# Patient Record
Sex: Female | Born: 1940 | Race: White | Hispanic: No | Marital: Married | Smoking: Never smoker
Health system: Southern US, Community
[De-identification: ages and names within clinical notes are randomized; demographics above are authoritative.]

## PROBLEM LIST (undated history)

## (undated) DIAGNOSIS — E785 Hyperlipidemia, unspecified: Secondary | ICD-10-CM

## (undated) DIAGNOSIS — I10 Essential (primary) hypertension: Secondary | ICD-10-CM

## (undated) DIAGNOSIS — N183 Chronic kidney disease, stage 3 unspecified: Secondary | ICD-10-CM

## (undated) DIAGNOSIS — F329 Major depressive disorder, single episode, unspecified: Secondary | ICD-10-CM

## (undated) DIAGNOSIS — I639 Cerebral infarction, unspecified: Secondary | ICD-10-CM

## (undated) DIAGNOSIS — K219 Gastro-esophageal reflux disease without esophagitis: Secondary | ICD-10-CM

## (undated) DIAGNOSIS — D649 Anemia, unspecified: Secondary | ICD-10-CM

## (undated) HISTORY — PX: OTHER SURGICAL HISTORY: SHX169

## (undated) HISTORY — PX: URETER SURGERY: SHX823

## (undated) HISTORY — DX: Cerebral infarction, unspecified: I63.9

## (undated) HISTORY — PX: OOPHORECTOMY: SHX86

## (undated) HISTORY — DX: Anemia, unspecified: D64.9

## (undated) HISTORY — DX: Gastro-esophageal reflux disease without esophagitis: K21.9

## (undated) HISTORY — DX: Major depressive disorder, single episode, unspecified: F32.9

## (undated) HISTORY — DX: Hyperlipidemia, unspecified: E78.5

## (undated) HISTORY — PX: BACK SURGERY: SHX140

## (undated) HISTORY — PX: ABDOMINAL HYSTERECTOMY: SHX81

## (undated) HISTORY — DX: Essential (primary) hypertension: I10

## (undated) HISTORY — PX: TOTAL HIP ARTHROPLASTY: SHX124

---

## 2002-11-02 ENCOUNTER — Ambulatory Visit (HOSPITAL_BASED_OUTPATIENT_CLINIC_OR_DEPARTMENT_OTHER): Admission: RE | Admit: 2002-11-02 | Discharge: 2002-11-02 | Payer: Self-pay | Admitting: Urology

## 2002-11-30 ENCOUNTER — Encounter: Payer: Self-pay | Admitting: Neurosurgery

## 2002-12-02 ENCOUNTER — Observation Stay (HOSPITAL_COMMUNITY): Admission: RE | Admit: 2002-12-02 | Discharge: 2002-12-03 | Payer: Self-pay | Admitting: Neurosurgery

## 2002-12-02 ENCOUNTER — Encounter: Payer: Self-pay | Admitting: Neurosurgery

## 2003-03-22 ENCOUNTER — Ambulatory Visit (HOSPITAL_COMMUNITY): Admission: RE | Admit: 2003-03-22 | Discharge: 2003-03-22 | Payer: Self-pay | Admitting: Urology

## 2003-05-11 ENCOUNTER — Other Ambulatory Visit: Admission: RE | Admit: 2003-05-11 | Discharge: 2003-05-11 | Payer: Self-pay | Admitting: Gynecology

## 2003-05-11 ENCOUNTER — Encounter (INDEPENDENT_AMBULATORY_CARE_PROVIDER_SITE_OTHER): Payer: Self-pay | Admitting: *Deleted

## 2003-05-11 ENCOUNTER — Ambulatory Visit: Admission: RE | Admit: 2003-05-11 | Discharge: 2003-05-11 | Payer: Self-pay | Admitting: Gynecology

## 2003-05-25 ENCOUNTER — Encounter (INDEPENDENT_AMBULATORY_CARE_PROVIDER_SITE_OTHER): Payer: Self-pay | Admitting: Specialist

## 2003-05-25 ENCOUNTER — Inpatient Hospital Stay (HOSPITAL_COMMUNITY): Admission: RE | Admit: 2003-05-25 | Discharge: 2003-05-29 | Payer: Self-pay | Admitting: Urology

## 2003-06-24 ENCOUNTER — Ambulatory Visit (HOSPITAL_BASED_OUTPATIENT_CLINIC_OR_DEPARTMENT_OTHER): Admission: RE | Admit: 2003-06-24 | Discharge: 2003-06-24 | Payer: Self-pay | Admitting: Urology

## 2003-06-24 ENCOUNTER — Ambulatory Visit (HOSPITAL_COMMUNITY): Admission: RE | Admit: 2003-06-24 | Discharge: 2003-06-24 | Payer: Self-pay | Admitting: Urology

## 2003-07-06 ENCOUNTER — Ambulatory Visit: Admission: RE | Admit: 2003-07-06 | Discharge: 2003-07-06 | Payer: Self-pay | Admitting: Gynecology

## 2004-11-16 ENCOUNTER — Ambulatory Visit: Payer: Self-pay

## 2005-08-16 ENCOUNTER — Inpatient Hospital Stay (HOSPITAL_COMMUNITY): Admission: EM | Admit: 2005-08-16 | Discharge: 2005-08-23 | Payer: Self-pay | Admitting: Emergency Medicine

## 2005-08-19 ENCOUNTER — Ambulatory Visit: Payer: Self-pay | Admitting: Physical Medicine & Rehabilitation

## 2006-09-09 ENCOUNTER — Encounter (INDEPENDENT_AMBULATORY_CARE_PROVIDER_SITE_OTHER): Payer: Self-pay | Admitting: Family Medicine

## 2006-09-09 ENCOUNTER — Ambulatory Visit: Payer: Self-pay | Admitting: Vascular Surgery

## 2006-09-09 ENCOUNTER — Ambulatory Visit (HOSPITAL_COMMUNITY): Admission: RE | Admit: 2006-09-09 | Discharge: 2006-09-09 | Payer: Self-pay | Admitting: Family Medicine

## 2007-10-23 ENCOUNTER — Ambulatory Visit (HOSPITAL_COMMUNITY): Admission: RE | Admit: 2007-10-23 | Discharge: 2007-10-23 | Payer: Self-pay | Admitting: Urology

## 2009-08-29 ENCOUNTER — Ambulatory Visit (HOSPITAL_COMMUNITY): Admission: RE | Admit: 2009-08-29 | Discharge: 2009-08-29 | Payer: Self-pay | Admitting: Legal Medicine

## 2009-08-29 ENCOUNTER — Ambulatory Visit: Payer: Self-pay | Admitting: Vascular Surgery

## 2009-08-31 ENCOUNTER — Ambulatory Visit (HOSPITAL_COMMUNITY)
Admission: RE | Admit: 2009-08-31 | Discharge: 2009-08-31 | Payer: Self-pay | Source: Home / Self Care | Admitting: Legal Medicine

## 2009-10-11 ENCOUNTER — Ambulatory Visit: Payer: Self-pay | Admitting: Vascular Surgery

## 2010-03-21 ENCOUNTER — Ambulatory Visit (HOSPITAL_COMMUNITY)
Admission: RE | Admit: 2010-03-21 | Discharge: 2010-03-21 | Payer: Self-pay | Source: Home / Self Care | Attending: Urology | Admitting: Urology

## 2010-04-12 ENCOUNTER — Ambulatory Visit
Admission: RE | Admit: 2010-04-12 | Discharge: 2010-04-12 | Payer: Self-pay | Source: Home / Self Care | Attending: Vascular Surgery | Admitting: Vascular Surgery

## 2010-04-15 ENCOUNTER — Encounter: Payer: Self-pay | Admitting: Urology

## 2010-04-27 NOTE — Procedures (Unsigned)
CAROTID DUPLEX EXAM  INDICATION:  Carotid disease.  HISTORY: Diabetes:  No. Cardiac:  No. Hypertension:  Yes. Smoking:  No. Previous Surgery:  No. CV History:  Currently asymptomatic. Amaurosis Fugax No, Paresthesias No, Hemiparesis No                                      RIGHT             LEFT Brachial systolic pressure:         142               146 Brachial Doppler waveforms:         Normal            Normal Vertebral direction of flow:        Antegrade         Antegrade DUPLEX VELOCITIES (cm/sec) CCA peak systolic                   91                74 ECA peak systolic                   315               353 ICA peak systolic                   P=76/M=136        172 ICA end diastolic                   P=21/M=39         41 PLAQUE MORPHOLOGY:                  Heterogeneous     Heterogeneous PLAQUE AMOUNT:                      Moderate          Moderate PLAQUE LOCATION:                    ECA               PICA/ ECA  IMPRESSION: 1. No hemodynamically significant stenosis of the right internal     carotid artery with mildly increased velocity noted in the right     mid internal carotid artery which appears to be due to vessel     tortuosity. 2. Doppler velocity suggests 40% to 59% stenosis of the left proximal     internal carotid artery. 3. Bilateral external carotid artery stenosis noted.  ___________________________________________ Janetta Hora Fields, MD  CH/MEDQ  D:  04/13/2010  T:  04/13/2010  Job:  [id number]

## 2010-08-07 NOTE — Assessment & Plan Note (Signed)
OFFICE VISIT   SAYDIE, GERDTS  DOB:  January 20, 1941                                       10/11/2009  FAOZH#:[id number]   CHIEF COMPLAINT:  Visual changes.   HISTORY OF PRESENT ILLNESS:  The patient is a 70 year old female  referred by Dr. Marina Goodell for evaluation of carotid stenosis.  The patient  states that approximately 2 months ago she had a couple of episodes  where she had swirling of colors in both eyes and became presyncopal.  She has had no symptoms like this since that time.  She denies any  classic symptoms of amaurosis.  She denies any prior stroke symptoms.  She was started on aspirin 1 month ago.  She also was found recently to  have elevated cholesterol.   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  These are currently controlled and followed  chronically by Dr. Gwen Pounds and Dr. Marina Goodell.   As part of her workup for these visual changes she had an MRI of the  brain.  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  These are  scattered bilaterally.   She also had an echocardiogram which showed an ejection fraction of 75%  with aortic valve sclerosis and mitral valve regurgitation.   PAST SURGICAL HISTORY:  She has had back operation, kidney operation,  hysterectomy, multiple cataracts, a hip fracture, wrist fracture and  multiple eye procedures for her glaucoma.   SOCIAL HISTORY:  She is married.  She does not smoke or consume alcohol  regularly.  She has one child.   FAMILY HISTORY:  Remarkable for her father who died at age 34 of  coronary artery disease and her brother had coronary artery disease at  age 78.   REVIEW OF SYSTEMS:  Full 12 point review of systems was performed with  the patient today.  Please see intake referral form for details  regarding this.   MEDICATIONS:  Losartan, furosemide, iron sulfate, aspirin,  vitamin C.   ALLERGIES:  She has nausea and vomiting with codeine.   PHYSICAL EXAM:  Vital signs:  Blood pressure is 161/84 in the right arm,  177/92 in the left arm, oxygen saturations 98% on room air, heart rate  67 and regular.  HEENT:  Unremarkable.  Neck:  Has 2+ carotid pulses  with bilateral carotid bruits right greater than left.  Chest:  Clear to  auscultation.  Cardiac:  Exam is regular rate and rhythm with a 3/6  murmur.  Abdomen:  Slightly obese, soft, nontender, nondistended.  No  masses.  Extremities:  She has no significant edema.  She has 2+ femoral  and dorsalis pedis pulses bilaterally.  She has 2+ brachial and radial  pulses bilaterally.  Neurological:  Shows symmetric upper extremity and  lower extremity motor strength which is 5/5 and symmetric.  Skin:  Has  no open ulcers or rashes.   I reviewed her carotid duplex exam dated 08/29/2009 from [facility].  This shows a 60% right internal carotid artery stenosis and  approximately a 70% to 75% left internal carotid artery stenosis with  antegrade vertebral flow.   I would not consider the patient's carotid stenosis to be symptomatic at  this point.  She has had evidence of  infarcts in the past but these were  more deep brain in nature and probably more related to hypertension than  carotid occlusive disease.  She has not had classic symptoms of  amaurosis or TIA symptoms recently.  I believe the best option for now  is risk factor modification including her aspirin therapy and evaluation  or continued evaluation of her hypertension and elevated cholesterol.  She will return for followup with me for a repeat carotid duplex exam in  six months' time.  She was noted to have high-grade stenosis of the  external carotid artery on the right side and this probably accounts for  the louder bruit.  However, the external carotid stenosis would not put  her at risk for cerebral events and overall again would consider  this to  be asymptomatic moderate stenoses which does not require intervention at  this point.  We will schedule her for a followup appointment in six  months' time.  I had a lengthy discussion with her and her husband that  if she develops any cerebral symptoms prior to that they will return  sooner.     Janetta Hora. Fields, MD  Electronically Signed   CEF/MEDQ  D:  10/11/2009  T:  10/12/2009  Job:  [id number]   cc:   Dr Thurman Coyer, MD

## 2010-08-10 NOTE — H&P (Signed)
[name], [name]                            ACCOUNT NO.:  [id number]   MEDICAL RECORD NO.:  [id number]                   PATIENT TYPE:  INP   LOCATION:  0001                                 FACILITY:  [facility]   PHYSICIAN:  Ronald L. Earlene Plater, M.D.               DATE OF BIRTH:  25-Aug-1940   DATE OF ADMISSION:  05/25/2003  DATE OF DISCHARGE:                                HISTORY & PHYSICAL   CHIEF COMPLAINT:  1. Left-sided hydroureteronephrosis.  2. Left-sided cystic pelvic mass.   HISTORY OF PRESENT ILLNESS:  [name] is a 70 year old white female with a  history of a left cystic adnexal mass as well as a very large dilated left-  sided ureter with accompanying hydronephrosis.  A nuclear renogram has been  performed demonstrating obstruction on the left side.  However, there is  preserved renal function as the differential renal function is 60% on the  right and 40% on the left.  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  In addition, [name] has been  found on CT scan to have a cystic left adnexal mass.  She has seen Dr.  Stanford Breed who is willing to remove the cystic mass and perform a  hysterectomy at the time of potential ureteral reimplantation.  Otherwise,  she is largely asymptomatic from her cyst as well as her hydroureter and  hydronephrosis.  She denies any fevers, chills, night sweats, nausea or  vomiting.   PAST MEDICAL HISTORY:  1. Hypertension.  2. Hiatal hernia/gastroesophageal reflux disease.  3. Arthritis.  4. Hypercholesterolemia.   PAST SURGICAL HISTORY:  1. Lumbar laminectomy.  2. Tubal ligation.  3. Cystoscopy with ureteroscopy.   FAMILY HISTORY:  There is no family history of genitourinary malignancy.   SOCIAL HISTORY:  The patient does not smoke.  She denies alcohol use or any  other illicit drugs.   MEDICATIONS:  1. Prilosec 20 mg daily.  2. Cozaar 100 mg daily.  3. Norvasc 5 mg daily.  4.  Zocor 40 mg daily.  5. Maxzide 25 mg daily.   REVIEW OF SYSTEMS:  Positive for hypertension as well as history of heart  murmur and shortness of breath.  Also, pertinent is history of corrected  vision by contact lenses as well as partial dentures.  The patient denies  any angina, chronic obstructive pulmonary disease, headache, seizures,  dizziness, anemia, easy bruising, constipation, diarrhea, or lower urinary  tract symptoms.   PHYSICAL EXAMINATION:  VITAL SIGNS:  Temperature 97.8. heart rate 72,  respiratory rate 16, blood pressure 156/100, weight 158 pounds.  GENERAL:  Well developed, mildly obese in no apparent distress.  HEENT:  Normocephalic, atraumatic.  Pupils equal, round and reactive to  light and accommodation.  Extraocular movements are intact.  The oropharynx  is clear and moist.  NECK:  Supple.  No lymphadenopathy.  There is no jugular venous distention.  CARDIOVASCULAR:  Regular  rate and rhythm.  No murmurs, gallops or rubs.  PULMONARY:  Clear to auscultation bilaterally.  ABDOMEN:  Obese, soft, nontender, nondistended with positive bowel sounds.  GU:  Normal-appearing female genitalia.  No evidence of discharge.  EXTREMITIES:  No clubbing, cyanosis or edema.  NEURO:  Cranial nerves II-XII grossly intact.  Motor is 5/5 throughout.  Sensation is grossly nonfocal.  SKIN:  Integument is intact.  No evidence of rash.   ALLERGIES:  CODEINE.   OUTSIDE IMAGING:  CT scan from March 24, 2003 shows a cystic left adnexal  mass measuring approximately 10.5 cm.  In addition, there is persistent  opacification of the left renal collecting system and hydroureter to the  level of the ureterovesical junction.  There was no evidence of solid mass  or adenopathy.  Nuclear renogram dated March 22, 2003:  Renogram showed a  differential renal function of 39.8% on the left and 60.2% on the right.  There was marked left hydronephrosis without significant emptying of the  left  renal collecting system following the administration of Lasix.  This is  consistent with high grade obstruction.   IMPRESSION:  [name] is a 70 year old white female who has currently two  surgical issues.  The first is a large left cystic adnexal mass.  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  She does appear to be obstructed on this  side, but appears to have salvageable function on this side as demonstrated  by her renogram.  Therefore, the plan will be to proceed with surgery on  May 25, 2003.  At this time, the patient will undergo hysterectomy with  removal of her cystic left adnexal mass as well as left ureteral  intravesical reimplantation.  The risks and benefits of the procedure have  been discussed with the patient and she is willing to proceed.  The  gynecological portion of the case will be conducted by Dr. Stanford Breed.     [name]                                Ronald L. Earlene Plater, M.D.    EG/MEDQ  D:  05/25/2003  T:  05/25/2003  Job:  [id number]

## 2010-08-10 NOTE — Op Note (Signed)
[name], CRITES NO.:  [id number]   MEDICAL RECORD NO.:  [id number]                   PATIENT TYPE:  INP   LOCATION:  0001                                 FACILITY:  [facility]   PHYSICIAN:  De Blanch, M.D.         DATE OF BIRTH:  March 03, 1941   DATE OF PROCEDURE:  DATE OF DISCHARGE:                                 OPERATIVE REPORT   PREOPERATIVE DIAGNOSIS:  Large left ovarian cyst, left ureterolysis.   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  Hydroureter.  (See Dr. Darvin Neighbours' dictation).   PROCEDURE:  Total abdominal hysterectomy and bilateral salpingo-  oophorectomy.   SURGEON:  De Blanch, M.D.   ASSISTANTS:  1. Ronald L. Earlene Plater, M.D.  2. Telford Nab, RN.   ANESTHESIA:  General with orotracheal tube.   ESTIMATED BLOOD LOSS:  100 cc.   SURGICAL FINDINGS:  At the time of exploratory laparotomy, there was a  large, smooth cystic mass arising from the left ovary, measuring  approximately 10 cm in diameter.  The right ovary, uterus, and fallopian  tubes appeared normal.  Exploration of the upper abdomen revealed no  abnormalities.  The left ureter was massively dilated (see Dr. Darvin Neighbours'  dictation).   PROCEDURE:  The patient was brought to the operating room and after  satisfaction obtainment of general anesthesia, the patient was placed in a  modified lithotomy position in Allen's stirrups.  The anterior abdominal  wall, perineum, and vagina were prepped with Betadine.  The Foley catheter  was inserted.  The patient was draped.  The abdomen was entered through a  low midline incision, and peritoneal washings were obtained.  The upper  abdomen and pelvis were explored with the above-noted findings and a  Bookwalter retractor was positioned, and the bowel was packed out of the  pelvis.  The left lateral peritoneum of the pelvis was incised, and the  retroperitoneal space opened,  identifying the ovarian vessels and the  ureter.  The ureter was massively dilated.  The ovarian vessels were  skeletonized, clamped, cut, free-tied and suture ligated.  The ovarian cyst  was then elevated, and the uterine-ovarian anastomosis and fallopian tube  were cross-clamped, divided, and the left tube and ovary were submitted for  a frozen section, which returned benign.  The right round ligament was  divided, and the retroperitoneal space opened.  The right ovarian vessels  were skeletonized, clamped, cut, free-tied and suture ligated after  identifying the right ureter.  The bladder flap was advanced to sharp and  blunt dissection.  Uterine vessels were skeletonized, clamped, cut, and  suture ligated in a step-wise fashion.  Cervical and cardinal ligaments were  clamped, cut, and suture-ligated.  The rectovaginal septum was opened for  further mobilization.  The vaginal angles were cross-clamped and divided,  transecting the cervix from its connection to the vagina.  Vaginal angles  were transfixed  with 0 Vicryl, sutured in the central portion of the vagina,  and rectovaginal space was closed with interrupted figure-of-eight sutures  of 0 Vicryl.  Hemostasis was excellent.   At this junction of the procedure, Dr. Earlene Plater assumed direction of the  surgical procedure to manage the patient's left hydroureter.  The remainder  of the procedure will be dictated by Dr. Earlene Plater.   At the completion of the surgical procedure, the sponge, needle, and  instrument counts were correct x2.                                               De Blanch, M.D.    DC/MEDQ  D:  05/25/2003  T:  05/25/2003  Job:  [id number]   cc:   Windy Fast L. Ovidio Hanger, M.D.  [address]  [place] [address]  [place] [address]  Fax: [phone]   Telford Nab, R.N.  [phone]  [place], [place] [address]

## 2010-08-10 NOTE — H&P (Signed)
NAMESABEEN, PIECHOCKI NO.:  [id number]   MEDICAL RECORD NO.:  [id number]          PATIENT TYPE:  INP   LOCATION:  1511                         FACILITY:  [facility]   PHYSICIAN:  Nadara Mustard, MD     DATE OF BIRTH:  [birth year]   DATE OF ADMISSION:  08/15/2005  DATE OF DISCHARGE:  08/23/2005                                HISTORY & PHYSICAL   HISTORY OF PRESENT ILLNESS:  The patient is a 70 year old woman who fell on  the day of evaluation, sustaining a distal radius fracture with dorsal  angulation and shortening, as well as a left femoral neck fracture.  The  patient was evaluated in the emergency room, routine laboratory studies were  obtained, and the patient was felt to be stable for surgical intervention  for fixation of the femoral neck fracture as well as left distal radius  fracture.   For list of medications, allergies, previous surgeries, family history,  social history, and review of systems, please see history and physical  summary sheet.   PHYSICAL EXAMINATION:  VITAL SIGNS:  Blood pressure 116/58, pulse 81,  respiratory rate 18, O2 sat is 98% on room air.  GENERAL:  She is in no acute distress.  LUNGS:  Clear to auscultation.  CARDIOVASCULAR:  Regular rate and rhythm.  NECK:  Supple.  No bruits.  EXTREMITIES:  On examination of the left lower extremity, she has pain with  internal and external rotation of the left foot.  She does have a palpable  dorsalis pedis pulse.  Radiograph shows a left femoral neck fracture.  On  examination of the left wrist, she has decreased range of motion of the  fingers secondary to the left distal radius fracture.  Radiograph shows a  left distal radius fracture.   ASSESSMENT:  1.  Left femoral neck fracture.  2.  Left distal radius fracture.   PLAN:  The patient is scheduled for hemi-arthroplasty of the left hip at  this time.  Also evaluated for closed versus open reduction of the left  distal radius.  Risks and  benefits were discussed including infection,  neurovascular injury, persistent pain, DVT, pulmonary embolus, need for  additional surgery.  The patient states she understands and wishes to  proceed at this time.      Nadara Mustard, MD  Electronically Signed     MVD/MEDQ  D:  09/25/2005  T:  09/25/2005  Job:  [id number]

## 2010-08-10 NOTE — Discharge Summary (Signed)
[name], KEMLER NO.:  [id number]   MEDICAL RECORD NO.:  [id number]                   PATIENT TYPE:  INP   LOCATION:  0362                                 FACILITY:  [facility]   PHYSICIAN:  Lucrezia Starch. Ovidio Hanger, M.D.           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   OPERATIVE PROCEDURE:  Abdominal exploratory laparotomy, excision of left  pelvic mass, hysterectomy, and bilateral salpingo-oophorectomy by Dr. De Blanch; ureteral dissection with ureteral re-implantation by Dr.  Gaynelle Arabian on May 25, 2003.   HISTORY AND PHYSICAL EXAMINATION:  Ms. Vanzile is a lovely 70 year old white  female who presented with history of left cystic mass as well as a large  dilated left-sided ureter with accompanying hydronephrosis. A nuclear renal  scan was performed demonstrating obstruction on the left side, preserved  renal function with function of 60% on the right and 40% on the left. 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 On CT scan, she had a left  cystic adnexal mass.  After discussing the benefits, risks, and  alternatives, she elected to proceed with repair.   Past medical history, social history, family history, and review of systems,  please see history and physical examination for full details.   PHYSICAL EXAMINATION:  VITAL SIGNS: She was afebrile, with vital signs  stable.  GENERAL: Well-developed, well-nourished, slightly obese, in no acute  distress.  HEAD, EYES, EARS, NOSE & THROAT:  Normal.  NECK:  Without mass or thyromegaly.  CHEST: Normal diaphragmatic motion.  ABDOMEN: Soft and nontender without mass, organomegaly, or hernia.  EXTREMITIES: Normal.  NEUROLOGIC: Intact.   HOSPITAL COURSE:  The patient was admitted after undergoing proper  preoperative evaluation and subsequently taken to surgery on May 25, 2003,  and underwent excision of left cystic adnexal mass, hysterectomy, bilateral  salpingo-oophorectomy, dissection of left ureter, and left ureteral re-  implantation by Dr. Stanford Breed and myself. Postoperatively, initially  she was doing very well. Jackson-Pratt times two had serosanguineous fluid  only, I&O was adequate. By May 26, 2003, she had a low grade fever, abdomen  was soft, postoperative H&H was stable, and she was comfortable by  postoperative day and tolerating a diet. By postoperative day two, T-max was  100.4 degrees Fahrenheit. She had a few rales in her lung bases probably  felt to be due to atelectasis. Abdomen was soft. The wound was clean.  Extremities normal. The Blake output was decreased. Urine was clear and I&O  was adequate. Bowel sounds were positive and there was no flatus. She was  tolerating clear liquids and was increased to full liquids with pulmonary  toilet. Ambulation was increased by May 28, 2003. She was tolerating a diet  well, abdomen was soft, wound was clean, drains had 5 cc each, Foley was  clear, and PCA morphine was discontinued. Diet was advanced. By May 29, 2003, she was eating well, had a bowel movement, abdomen was soft and  nontender, and nondistended. She was subsequently discharged and her left  drain was removed. The right drain was maintained.   DISCHARGE MEDICATIONS:  Darvocet N-100.   DISCHARGE CONDITION:  Improved.   She is to follow up next week for staple removal.                                               Ronald L. Ovidio Hanger, M.D.    RLD/MEDQ  D:  05/31/2003  T:  06/01/2003  Job:  [id number]

## 2010-08-10 NOTE — Op Note (Signed)
NAMEEDNA, REDE                            ACCOUNT NO.:  [id number]   MEDICAL RECORD NO.:  [id number]                   PATIENT TYPE:  AMB   LOCATION:  NESC                                 FACILITY:  [facility]   PHYSICIAN:  Ronald L. Ovidio Hanger, M.D.           DATE OF BIRTH:  14-Nov-1940   DATE OF PROCEDURE:  11/02/2002  DATE OF DISCHARGE:                                 OPERATIVE REPORT   PREOPERATIVE DIAGNOSIS:  Left hydroureteral nephrosis.   OPERATION/PROCEDURE:  1. Cystourethroscopy.  2. Left retrograde pyelogram.  3. Balloon dilatation of left distal ureter with ureteroscopy.   SURGEON:  Lucrezia Starch. Earlene Plater, M.D.   ANESTHESIA:  Laryngeal mask anesthesia   ESTIMATED BLOOD LOSS:  Negligible.   TUBES:  None.   COMPLICATIONS:  None.   INDICATIONS FOR PROCEDURE:  Mrs. Summey is a lovely 70 year old white female  who basically presented with left leg pain and back pain.  She had undergone  an MRI which revealed a massively left hydroureteral nephrosis and a  followup CT scan which revealed a massive left hydroureteral nephrosis  columned to the ureterovesical junction.  There was no stone, etc., seen.  After understanding risks, benefits and alternatives, she has elected to  proceed with the above procedure.   DESCRIPTION OF PROCEDURE:  The patient was placed in the supine position.  After a  proper laryngeal mask anesthesia, was placed in the dorsal  lithotomy position and prepped and draped with Betadine in a sterile  fashion.  Cystourethroscopy was performed with a 22.5-French Olympus  panendoscope utilizing the 12- and in 70-degree lenses.  The bladder was  carefully inspected.  Efflux of clear urine was noted from the right  ureteral orifice.  The left ureteral orifice was in normal location possibly  to slightly laterally.  It certainly appeared to be non refluxing and  essentially normal.  No other lesions were noted.  A 0.03-French Teflon -  coated guide wire was placed  into the left renal pelvis and a 6-French  opening in the catheter was passed and a hydronephrotic drip was noted.  Dye  was injected and there was noted a huge patulous collecting system.  The  ureter was probably 3 cm in diameter and columned down to a tapered end at  the ureterovesical junction.  No filling defects were noted to be present.  The distal ureter was then balloon-dilated over a 0.03-French Teflon-coated  guide wire.  A 4 mm ureteral balloon dilator was inflated to 10 atmospheres  for three minutes.  Ureteroscopy was then performed with an A[facility]I  ureteroscope and the lower two-thirds ureter was easily visualized. It was  noted to be massively dilated.  It felt most likely to be a  congenital megaureter, columning down to a pinpoint area at the distal  ureter orifice which had been widely balloon dilated.  The system was  drained.  The panendoscope was  removed along with the wire and the patient  was taken to the recovery room stable.                                                Ronald L. Ovidio Hanger, M.D.    RLD/MEDQ  D:  11/02/2002  T:  11/02/2002  Job:  [id number]

## 2010-08-10 NOTE — Op Note (Signed)
NAMEREAGEN, GOATES NO.:  [id number]   MEDICAL RECORD NO.:  [id number]                   PATIENT TYPE:  INP   LOCATION:  3012                                 FACILITY:  [facility]   PHYSICIAN:  Danae Orleans. Venetia Maxon, M.D.               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   PROCEDURE:  Left L5-S1 hemi semilaminectomy with microdissection and  microdiskectomy.   SURGEON:  Danae Orleans. Venetia Maxon, M.D.   ASSISTANT:  Cristi Loron, M.D.   ANESTHESIA:  General endotracheal anesthesia.   ESTIMATED BLOOD LOSS:  Minimal.   COMPLICATIONS:  None, disposition to the recovery room.   INDICATIONS FOR PROCEDURE:  Jermaine Tholl is a 70 year old woman with severe  left L5 radiculopathy. She had an MRI which appears to show a foraminal disk  herniation at the L5-S1 level compressing the L5 nerve root on the left. It  was elected to take her to surgery for microdiskectomy.   DESCRIPTION OF PROCEDURE:  Ms. Duffin was brought to the operating room.  Following the satisfactory and uncomplicated induction of general  endotracheal anesthesia and placement of AVS lines the patient was placed in  the prone position on the Wilson frame. Her lower  back was shaved, prepped  and draped in the usual sterile fashion. The area of planned incision was  infiltrated with 0.25% Marcaine, 0.5% Lidocaine and 1:200,000 epinephrine.   An incision was made in the midline overlying the L5-S1 interspace and  carried through the adipose tissue. The lumbodorsal fascia was incised on  the left side and midline.  Subperiosteal dissection was performed exposing  the L5-S1 interspace. Interoperative x-ray  confirmed the correct level.   The hemi semilaminectomy of L5 was performed and this was carried fairly  high on the L5 lamina to gain access to the L5 nerve root. The lateral  recess was decompressed and the ligament of Flavum was detached and removed  in a piecemeal fashion with Kerrison rongeurs.   The microscope was brought into the field and using microdissection  technique  the L5 nerve root was identified. Just  medial to this there was  a large  fragment of herniated disk material which was compressing the L5  nerve root. A dilated vein was cauterized which was draped over this  herniated disk material and then multiple fragments of disk  material were  removed  which resulted in significant decompression of the left L5 nerve  root. The lateral  foraminal aspect was also probed with a variety of  hooks  and multiple additional fragments of disk material were removed.   The L5 nerve root appeared to be well decompressed at this point and passed  through the neuroforamen unencumbered. Hemostasis was assured and the wound  was irrigated with Bacitracin saline. The self-retaining retractors were  removed. The microscope was taken out of the field. The lumbodorsal fascia  was closed with 0 Vicryl suture. The subcutaneous tissue was reapproximated  with 2-0 Vicryl interrupted inverted sutures and the skin edges were  reapproximated with interrupted 3-0 Vicryl subcuticular stitch. The wound  was dressed with Dermabond.   The patient was extubated in the operating room and taken to the recovery  room in stable and satisfactory condition. She tolerated  the procedure  well. The counts were correct at the end of the case.                                               Danae Orleans. Venetia Maxon, M.D.    JDS/MEDQ  D:  12/02/2002  T:  12/03/2002  Job:  [id number]

## 2010-08-10 NOTE — Op Note (Signed)
[name], [name]                            ACCOUNT NO.:  [id number]   MEDICAL RECORD NO.:  [id number]                   PATIENT TYPE:  INP   LOCATION:  0001                                 FACILITY:  [facility]   PHYSICIAN:  Ronald L. Earlene Plater, M.D.               DATE OF BIRTH:  05-13-1940   DATE OF PROCEDURE:  05/25/2003  DATE OF DISCHARGE:                                 OPERATIVE REPORT   PREOPERATIVE DIAGNOSIS:  1. Left hydroureteronephrosis, congenital megaureter.  2. Left cystic adnexal mass.   POSTOPERATIVE DIAGNOSIS:  1. Left hydroureteronephrosis, congenital megaureter.  2. Left cystic adnexal mass.   PROCEDURES PERFORMED:  1. Hysterectomy and bilateral salpingo-oophorectomy.  2. Left intravesical ureteral reimplantation.   ATTENDING SURGEONS:  1. Ronald L. Earlene Plater, M.D.  2. De Blanch, M.D.   RESIDENT SURGEON:  Dr. Thyra Breed.   ANESTHESIA:  General endotracheal.   ESTIMATED BLOOD LOSS:  125 cc.   FLUIDS:  LR 1800.   DRAINS:  1. A 22 French Foley catheter.  2. A 10 [place] Blake drain to bulb suction x2.   COMPLICATIONS:  None.   INDICATIONS FOR PROCEDURE:  This is a 70 year old white female found on a  workup for left hip pain to have a cystic left adnexal mass as well as  severe left hydroureteronephrosis.  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  She is brought to the operating room at this time for definitive surgical  management of her left cystic adnexal mass as well as hysterectomy to be  performed by Dr. Stanford Breed of GYN oncology as well as a left ureteral  reimplantation.   PROCEDURE IN DETAIL:  Following identification by her arm bracelet, the  patient was brought to the operating room and placed in the supine position.  She received preoperative IV antibiotics and underwent general  endotracheal  anesthesia.  The abdomen was then prepped and draped in a sterile fashion  following placement of a 22 French Foley catheter.  A lower abdominal  midline incision was then made using scalpel.  This was carried down through  the subcutaneous tissues to the level of the fascia using Bovie  electrocautery.  The fascia was then incised and opened with Bovie  electrocautery after careful blunt dissection of any underlying bowel from  the fascia.  Once into the abdomen, the bowel was packed superiorly from the  operative field.  Removal of the left adnexal cyst as well as hysterectomy  and bilateral salpingo-oophorectomy was performed by Dr. Stanford Breed of  GYN oncology.  Please see a separate operative note for this portion of the  procedure.  Following removal of the cystic adnexal mass and hysterectomy,  we then performed our portion of the procedure.  This began by careful  mobilization  of the largely dilated and tortuous left ureter.  Right angle  dissection and Bovie electrocautery was used as well as sharp dissection  with Metzenbaum scissors to isolate the ureter until its entry point into  the bladder.  The ureter was then transected at its distal insertion into  the bladder.  At this time, the Bookwalter retractor blades were reset to  allow mobilization of the bladder.  The bladder was filled with  approximately 200 cc of sterile irrigant.  Again, right angle dissection and  Bovie electrocautery were used to allow easy visualization of the anterior  as well as the dome of the bladder.  Bovie electrocautery was then used to  open an approximate 3 cm segment of the bladder in a verticale fashion.  This allowed easy visualization of the Foley catheter balloon within the  bladder.  The right ureteral orifice was also identified.  Following  administration of indigo carmine, the right ureteral orifice was seen to be  effluxing blue urine.  From an extra-vesicle perspective,  we then defatted  the posterior bladder wall at the site of our left ureteral reimplantation.  A Kelly clamp was then used to expose the intravesical portion of the  reimplant site.  Bovie electrocautery was used to make a small orifice.  The  Kelly clamps were then used to perforate the bladder wall.  A right angle  clamp was then passed through the intravesical portion of the new hiatus.  A  previously placed 3-0 chromic stay suture in the ureter was grasped and  pulled through the hiatus.  This revealed a length of approximately 4 cm of  redundant ureter.  It was therefore decided to remove approximately 3 cm of  ureter at this time.  Metzenbaum scissors were then used to sharply incise  the ureter.  Again, orientation of the ureter was carefully checked, both  intravesical and extravesical to insure no kinking of the ureter upon  reimplantation.  A 3-0 chromic stitch was then placed at the apex of the  transected ureter and used to affix to the intramural portion of the  bladder.  Four quadrant 3-0 chromic sutures were then placed to see the  ureter properly within the bladder.  These were then tied into place.  A 3-0  chromic interrupted suture was then used in a circumferential fashion to  finish the reimplantation.  We then turned our attention to the extra-  vesicle portion of the left ureter.  Then 3-0 chromic interrupted sutures  were again used to place traction sutures to remove any tension from the  newly reimplanted ureter.  Following this maneuver, the bladder was then  closed in a two-layer fashion with 3-0 chromic suture.  The first layer was  carefully incorporated mucosa in each bite.  This layer was closed in a  running fashion.  A second layer bladder closure was then performed,  imbricating above the first layer to insure water-tight closure.  The  bladder was then filled with approximately 200 cc of sterile irrigant and seemed to be water-tight.  At this time, we  again used figure-of-eight 3-0  chromic sutures to extra-peritonealize the newly reimplanted ureter.  Prior  to closing the peritoneal defect, a #10 Blake drain was placed through the  left lower quadrant into the area surrounding the left ureteral  reimplantation.  A second #10 Blake drain was brought through the right  lower quadrant and placed in the space just anterior to the bladder closure.  The wound was then copiously irrigated.  Any remaining bleeding was  controlled.  There was excellent hemostasis at the conclusion of the case.  The fascia was then closed with a #1 PDS.  The wound was again irrigated.  Surgical clips were then applied for skin closure.  The patient tolerated  the procedure well.  There were no complications.   Dr. Darvin Neighbours and Dr. Stanford Breed were present and participated in the  entire case.   DISPOSITION:  After awakening from general anesthesia, the patient was  transported to the post anesthesia care unit in stable condition.  From  there, she will be transferred to the floor.     Susanne Borders, MD                           Lucrezia Starch. Earlene Plater, M.D.   DR/MEDQ  D:  05/25/2003  T:  05/25/2003  Job:  [id number]

## 2010-08-10 NOTE — Op Note (Signed)
NAMETRIVA, HUEBER NO.:  [id number]   MEDICAL RECORD NO.:  [id number]          PATIENT TYPE:  INP   LOCATION:  1511                         FACILITY:  [facility]   PHYSICIAN:  Nadara Mustard, MD     DATE OF BIRTH:  [birth year]   DATE OF PROCEDURE:  08/17/2005  DATE OF DISCHARGE:                                 OPERATIVE REPORT   PREOPERATIVE DIAGNOSIS:  1.  Left the distal radius Colles fracture.  2.  Left femoral neck fracture.   POSTOPERATIVE DIAGNOSIS:  1.  Left the distal radius Colles fracture.  2.  Left femoral neck fracture.   PROCEDURE:  1.  Closed reduction left distal radius.  2.  Left hip hemiarthroplasty with DePuy #2 stem a 45 mm bipolar head.   SURGEON.:  Nadara Mustard, M.D.   ANESTHESIA:  General.   ESTIMATED BLOOD LOSS:  Minimal.   ANTIBIOTICS:  1 gram of Kefzol.   DRAINS:  None.   COMPLICATIONS:  None.   TOURNIQUET TIME:  None.   DISPOSITION:  To PACU in stable condition.   INDICATIONS FOR PROCEDURE:  The patient is a 70 year old woman who fell on  05/25 sustaining a left distal radius fracture with dorsal angulation and  shortening and a left femoral neck fracture.  The patient was evaluated and  felt to be stable for surgical intervention and presents at this time for  the above-mentioned procedures.  Risks and benefits were discussed with the  patient and her family including infection, neurovascular injury, injury of  the sciatic nerve, DVT, pulmonary embolus, dislocation, need for additional  surgery, loss of reduction of the wrist.  The patient and family state they  understand and wish to proceed at this time.   DESCRIPTION OF PROCEDURE:  The patient was brought to the OR and underwent a  general anesthetic.  After adequate level of anesthesia obtained, the  patient underwent closed reduction of the left distal radius.  She was then  placed in a sugar-tong splint.  This was well-padded and molded. Once the  splint had  solidified, the patient was then transferred to the operating  room bed and she was placed in the right lateral decubitus position with the  left side up.  The left lower extremity was then prepped and draped into a  sterile field. Ioban used to cover all exposed skin.  All supplies material  was used for the closed reduction of the wrist were kept outside the room to  minimize contamination within the room.  The patient underwent a posterior  lateral incision and this was carried down to tensor fascia lata which was  split and a Charnley retractor was placed.  The piriformis and short  external rotators were tagged, cut and retracted.  The capsule was incised  longitudinally and was retracted with Ethibond suture.  The hip was  dislocated.  The head was delivered and the head measured 45.  This was  trialed with 44, 45 and 46 and 45 was chosen.  The canal was sequentially  broached to a size 2 stem.  Trial reduction was performed and this had good  stability.  The trial components were removed.  The wound was irrigated with  normal saline throughout the case.  The #2 stem was placed.  The 45 mm  bipolar head was placed and the hip was reduced.  The hip was placed through  full range of motion.  She had full flexion and full adduction with internal  rotation of 45 degrees and the hip was stable.  She had full extension and  external rotation which was also stable.  The wound was irrigated with  normal saline.  The capsule was closed using a #1 Ethibond. The piriformis  and short external rotators were closed using #1 Ethibond. The tensor fascia  lata was closed using a running #1 Vicryl. Subcu was closed using 2-0  Vicryl.  Skin was closed using approximating staples.  The wound was covered  with Adaptic orthopedic sponges, ABD dressing and Hypafix tape.  The patient  was placed in the knee immobilizer, extubated and taken to PACU in stable  condition.      Nadara Mustard, MD   Electronically Signed     MVD/MEDQ  D:  08/17/2005  T:  08/18/2005  Job:  [id number]

## 2010-08-10 NOTE — Discharge Summary (Signed)
[name], EHLER NO.:  [id number]   MEDICAL RECORD NO.:  [id number]          PATIENT TYPE:  INP   LOCATION:  1511                         FACILITY:  [facility]   PHYSICIAN:  Vanita Panda. Magnus Ivan, M.D.DATE OF BIRTH:  1940/12/14   DATE OF ADMISSION:  08/15/2005  DATE OF DISCHARGE:  08/22/2005                                 DISCHARGE SUMMARY   ADMISSION DIAGNOSIS:  1.  Left distal radius fracture.  2.  Left femoral neck/hip fracture.   DISCHARGE DIAGNOSIS:  1.  Left distal radius fracture.  2.  Left femoral neck/hip fracture.   PROCEDURES:  1.  Closed reduction of left distal radius fracture.  2.  Left hip hemiarthroplasty, both performed on Aug 17, 2005.   HOSPITAL COURSE:  Briefly, [name] is a 70 year old female who fell on the  day of admission sustaining a left distal radius fracture with dorsal  angulation and shortening and a left femoral neck fracture.  The patient was  evaluated and felt stable for surgical intervention and was taken to the  operating room the day after admission for closed reduction of the wrist  fracture and splinting and a left hemiarthroplasty under general anesthesia.  These were performed without complications by Dr. Aldean Baker.  The  remainder of her convalescence was then uncomplicated in the regular  hospital bed at [facility].  She is, otherwise, a healthy individual.  Her  postoperative course consisted of weight-bearing as tolerated with a  platform walker through her left forearm as well as weight-bearing as  tolerated on her left hip.  Dressing changes were performed daily on her hip  and she has been on Coumadin, as well, for DVT prophylaxis.   While in the hospital, she progressed slowly but did well with physical  therapy and by the day of discharge, had her Foley catheter removed, was on  oral pain medications as well as tolerating a regular diet.  Her IV had been  removed, as well, and she was doing well.   However, it was felt that she  needed further convalescence in a skilled nursing facility for further  rehabilitation.   DISPOSITION:  To a skilled nursing facility.   DISCHARGE INSTRUCTIONS:  Her dressing on her left hip should be changed  daily with a dry dressing.  She should also be allowed to weight-bear as  tolerated on her left hip as well as using a platform walker through her  left forearm.  A follow up appointment should be established in Dr. Audrie Lia  office two weeks after discharge.   DISCHARGE MEDICATIONS:  1.  Zetia 10 mg p.o. daily.  2.  Lasix 20 mg p.o. daily.  3.  Claritin 200 mg p.o. daily.  4.  Cozaar 100 mg p.o. daily.  5.  Sular 20 mg p.o. daily.  6.  Protonix 40 mg p.o. daily.  7.  Potassium 15 mEq p.o. daily.  8.  Zocor 20 mg p.o. daily.  9.  Maalox p.o. p.r.n.  10. Milk of Magnesia p.o. p.r.n.  11. Robaxin 500 mg p.o. q.6h. p.r.n.  12.  Darvocet 1-2 p.o. q.4-6h. p.r.n. pain, #13.  13. Coumadin 1 mg p.o. daily.           ______________________________  Vanita Panda. Magnus Ivan, M.D.     CYB/MEDQ  D:  08/22/2005  T:  08/22/2005  Job:  [id number]

## 2010-08-10 NOTE — Op Note (Signed)
[name], SAVOIA                            ACCOUNT NO.:  [id number]   MEDICAL RECORD NO.:  [id number]                   PATIENT TYPE:  AMB   LOCATION:  [facility]                                 FACILITY:  [facility]   PHYSICIAN:  Ronald L. Earlene Yates, M.D.               DATE OF BIRTH:  09-Feb-1941   DATE OF PROCEDURE:  06/24/2003  DATE OF DISCHARGE:                                 OPERATIVE REPORT   PREOPERATIVE DIAGNOSIS:  Retained foreign body (Blake drain).   POSTOPERATIVE DIAGNOSIS:  Retained foreign body (Blake drain).   PROCEDURE PERFORMED:  1. Exploratory laparotomy.  2. Foreign body excision.   SURGEON:  [name], M.D.   ASSISTANT:  Thyra Breed, M.D.   ANESTHESIA:  MAC.   COMPLICATIONS:  None.   DRAINS:  None.   INDICATION FOR PROCEDURE:  [name] is a very pleasant 70 year old female  who was recently discharged from the hospital on May 29, 2003, after  undergoing abdominal exploratory laparotomy, excision of left pelvic mass,  hysterectomy, bilateral salpingo-oophorectomy, and ureteral dissection with  intravesical ureteral reimplantation.  Postoperatively the patient has done  very well following this procedure.  Cystogram has been performed since this  time which showed no extravasation, and the patient's Foley catheter was  removed.  She is now voiding without difficulty on her own; however, two  drains were left in place following her procedure.  The JP drain exiting her  right lower quadrant was found to be fixed in position.  Multiple attempts  were made at removal; however, the drain appeared to be obviously sewn in  place.  Therefore, approximately one month has passed to allow fascial and  other tissue closure.  She is being brought to the operating room at this  time to undergo excision of her drain through a small exploratory  laparotomy.  The patient has been informed of the risks, benefits, and  alternatives of the procedure and is willing to proceed.   PROCEDURE IN DETAIL:  Following identification by her arm bracelet, the  patient was brought to the operating room, where she underwent MAC  anesthesia.  She then received preoperative IV antibiotics and her abdomen  was prepped and draped in the usual sterile fashion.  The portion of her  drain exiting the right lower quadrant was also prepped into the field.  Initial inspection of the drain again showed that it could not be removed by  gentle pressure.  Therefore, a small 4-6 cm incision was made in the lower  portion of the midline incision overlying the expected intra-abdominal  course of the drain.  Bovie electrocautery was used to dissect down through  the subcutaneous tissue until a suture was encountered at the level of the  fascia.  A Kocher clamp was used to elevate this suture, and it was removed.  This suture was removed in its entirety and, in fact, was  a figure-of-eight  suture used to finish reapproximation of the fascia following her previously-  mentioned procedure.  However, the drain was still fixed in position and, in  fact, began to tear slightly.  Therefore, no further traction was put on the  drain.  We then continued our exploration by opening a very small portion of  the fascia approximately 3 cm.  This allowed direct visualization of the  drain as well as the #1 PDS fascial suture which had caught the drain during  its exit.  Therefore, this fascial suture was cut using suture scissors.  The drain was then freely mobile.  Excess drain was then cut overlying the  skin and DeBakey forceps were used to bring the drain in its entirety  through the fascial opening.  At this time both the wound and the JP site  were copiously irrigated with antibiotic solution.  The fascial defect was  then closed with one figure-of-eight 0 PDS suture.  A second figure-of-eight  suture of 0 PDS was then used in a figure-of-eight fashion to reapproximate  the scar overlying the fascia.   Both wounds were then again copiously  irrigated with antibiotic solution.  The incision was then injected with 10  mL of 0.5% Marcaine.  Surgical clips were then applied to complete wound  closure.  Neosporin and a Band-Aid were placed on the previous drain exit  site.  This marked the termination of the procedure.  The patient tolerated  the procedure well, and there were no complications.  Please note that Dr.  Gaynelle Arabian was present and participated in the entire procedure, as he was  the responsible surgeon.   DISPOSITION:  After awaking from MAC analgesia, the patient was transported  to the outpatient postanesthesia care unit in stable condition.  From here  she would be discharged to home.  She is given a prescription for Darvocet  to control any pain as well as a five-day prescription for Keflex.     Thyra Breed, MD                            [name], M.D.    EG/MEDQ  D:  06/24/2003  T:  06/24/2003  Job:  [id number]

## 2010-08-10 NOTE — Consult Note (Signed)
[name], [name] NO.:  [id number]   MEDICAL RECORD NO.:  [id number]                   PATIENT TYPE:  OUT   LOCATION:  GYN                                  FACILITY:  [facility]   PHYSICIAN:  De Blanch, M.D.         DATE OF BIRTH:  06-04-1940   DATE OF CONSULTATION:  07/06/2003  DATE OF DISCHARGE:                                   CONSULTATION   HISTORY OF PRESENT ILLNESS:  A 70 year old white female who returns for a  postoperative check, having undergone a total abdominal hysterectomy and  bilateral salpingo-oophorectomy on May 25, 2003, for what turned out to be  a benign simple cyst.  She also had a hydroureter on the left, which was  reimplanted.  Her postoperative course was smooth, except it was complicated  by drain which had been sutured in place requiring a second minor operation  to release the suture.   Today, the patient is feeling well.  She has some pain in the right lower  inguinal region, which she says is secondary to shingles, which she  developed a few weeks ago, and was treated with Famvir.   The patient denies any other significant GI or GU symptoms.  Her function  status is improving significantly.   PHYSICAL EXAMINATION:  VITAL SIGNS:  Weight 152 pounds, blood pressure  150/88.  GENERAL:  The patient is a healthy white female.  ABDOMEN:  Soft, nontender.  Midline incision is healing well.  PELVIC EXAM:  EGBUS, vaginal, bladder, urethra are normal.  The cuff is  healing well.  Cervix and uterus surgically absent.  Adnexa without masses.  RECTOVAGINAL EXAM:  Confirmatory.   IMPRESSION:  Good postoperative recovery following total abdominal  hysterectomy/bilateral salpingo-oophorectomy.   ACTIVITY:  The patient was given the okay to return to full levels of  activity.   FOLLOW UP:  1. She will continue follow up with Dr. Earlene Plater regarding her kidney and     bladder function.  2. She will see her primary care  physician with regard to continuing     gynecologic care on an annual basis.                                               De Blanch, M.D.    DC/MEDQ  D:  07/06/2003  T:  07/06/2003  Job:  [id number]   cc:   Windy Fast L. Ovidio Hanger, M.D.  [address]  [place]  [place] [address]  Fax: [phone]   Telford Nab, R.N.  [phone] [address]  [place], [place] [address]

## 2010-08-10 NOTE — Consult Note (Signed)
NAMEMIETTE, MOLENDA NO.:  [id number]   MEDICAL RECORD NO.:  [id number]                   PATIENT TYPE:  OUT   LOCATION:  GYN                                  FACILITY:  [facility]   PHYSICIAN:  De Blanch, M.D.         DATE OF BIRTH:  1940/07/14   DATE OF CONSULTATION:  DATE OF DISCHARGE:                                   CONSULTATION   GYNECOLOGY-ONCOLOGY CONSULTATION:   REASON FOR CONSULTATION:  [age]-year-old white female seen in  consultation at the request of Dr. Darvin Neighbours.   BRIEF HISTORY:  In the course of being evaluated for hip pain, the patient  underwent an MRI which showed a cystic pelvic mass.  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  The patient really has no significant pelvic symptoms although she  does have some hip pain and back pain.  She denies any past gynecologic  history.  She is menopausal, does not take hormone replacement therapy and  has had one child delivered vaginally.   PAST MEDICAL HISTORY:  1. Medical illnesses:  Hypertension, elevated cholesterol, hiatal hernia.  2. Past surgical history:  Lumbar laminectomy, bilateral tubal ligation.  3. Past gynecologic history:  The patient is menopausal.  She has one living     child.   CURRENT MEDICATIONS:  Darvocet p.r.n. pain.   FAMILY HISTORY:  Negative for gynecologic, breast or colon cancer.   SOCIAL HISTORY:  The patient is married.  She does not smoke.  She works in  a Audiological scientist.   REVIEW OF SYSTEMS:  Negative except as noted above.   PHYSICAL EXAMINATION:  VITALS:  Height 5 feet, weight 152 pounds, blood  pressure 130/80.  GENERAL:  The patient is a healthy white female in no acute distress.  HEENT:  Negative.  NECK:  Supple without thyromegaly.  LYMPHATICS:  There is no supraclavicular or inguinal  adenopathy.  ABDOMEN:  The abdomen soft.  She does have a palpable mass in the left lower  quadrant extending about half way to the umbilicus.  PELVIC:  EGBUS, vagina, bladder, urethra are normal.  Cervix is normal.  Uterus is anterior, normal shape, size and consistency.  There is some  fullness in the left adnexal region without a discrete mass in the pelvis.   IMPRESSION:  Ovarian cyst associated with hydroureter (left).  We will plan  on proceeding to the operating room with Dr. Darvin Neighbours for correction of  both of these problems.   The risks of gynecologic surgery including hemorrhage, infection, injury to  adjacent viscera, thromboembolic complications and anesthetic risks were  outlined.  The patient understands that if this turns out to be an ovarian  cancer further staging including peritoneal biopsies, pelvic and periaortic  lymphadenectomy and omentectomy  will be performed.  All of their questions  were answered and we will proceed with surgery in 2 weeks in conjunction  with Dr. Darvin Neighbours.                                               De Blanch, M.D.    DC/MEDQ  D:  05/11/2003  T:  05/12/2003  Job:  [id number]   cc:   Windy Fast L. Ovidio Hanger, M.D.  [address]  [place]  [place] [address]  Fax: [phone]   Lianne Bushy, M.D.  [address]  [place]  [place] [address]  Fax: [phone]   Telford Nab, R.N.  [phone] [address]  [place], [place] [address]

## 2011-04-05 DIAGNOSIS — D509 Iron deficiency anemia, unspecified: Secondary | ICD-10-CM | POA: Diagnosis not present

## 2011-04-11 DIAGNOSIS — D509 Iron deficiency anemia, unspecified: Secondary | ICD-10-CM | POA: Diagnosis not present

## 2011-04-18 DIAGNOSIS — I421 Obstructive hypertrophic cardiomyopathy: Secondary | ICD-10-CM | POA: Diagnosis not present

## 2011-04-18 DIAGNOSIS — I6529 Occlusion and stenosis of unspecified carotid artery: Secondary | ICD-10-CM | POA: Diagnosis not present

## 2011-04-18 DIAGNOSIS — I10 Essential (primary) hypertension: Secondary | ICD-10-CM | POA: Diagnosis not present

## 2011-04-18 DIAGNOSIS — E7800 Pure hypercholesterolemia, unspecified: Secondary | ICD-10-CM | POA: Diagnosis not present

## 2011-06-06 DIAGNOSIS — I10 Essential (primary) hypertension: Secondary | ICD-10-CM | POA: Diagnosis not present

## 2011-06-06 DIAGNOSIS — E7800 Pure hypercholesterolemia, unspecified: Secondary | ICD-10-CM | POA: Diagnosis not present

## 2011-06-06 DIAGNOSIS — Z23 Encounter for immunization: Secondary | ICD-10-CM | POA: Diagnosis not present

## 2011-06-06 DIAGNOSIS — Z0000 Encounter for general adult medical examination without abnormal findings: Secondary | ICD-10-CM | POA: Diagnosis not present

## 2011-06-13 DIAGNOSIS — D509 Iron deficiency anemia, unspecified: Secondary | ICD-10-CM | POA: Diagnosis not present

## 2011-06-13 DIAGNOSIS — K219 Gastro-esophageal reflux disease without esophagitis: Secondary | ICD-10-CM | POA: Diagnosis not present

## 2011-06-19 ENCOUNTER — Institutional Professional Consult (permissible substitution): Payer: Self-pay | Admitting: Cardiovascular Disease

## 2011-06-20 DIAGNOSIS — R928 Other abnormal and inconclusive findings on diagnostic imaging of breast: Secondary | ICD-10-CM | POA: Diagnosis not present

## 2011-06-20 DIAGNOSIS — Z1231 Encounter for screening mammogram for malignant neoplasm of breast: Secondary | ICD-10-CM | POA: Diagnosis not present

## 2011-06-26 DIAGNOSIS — D509 Iron deficiency anemia, unspecified: Secondary | ICD-10-CM | POA: Diagnosis not present

## 2011-06-26 DIAGNOSIS — I10 Essential (primary) hypertension: Secondary | ICD-10-CM | POA: Diagnosis not present

## 2011-06-26 DIAGNOSIS — K648 Other hemorrhoids: Secondary | ICD-10-CM | POA: Diagnosis not present

## 2011-06-26 DIAGNOSIS — F329 Major depressive disorder, single episode, unspecified: Secondary | ICD-10-CM | POA: Diagnosis not present

## 2011-06-26 DIAGNOSIS — H409 Unspecified glaucoma: Secondary | ICD-10-CM | POA: Diagnosis not present

## 2011-06-26 DIAGNOSIS — E785 Hyperlipidemia, unspecified: Secondary | ICD-10-CM | POA: Diagnosis not present

## 2011-06-26 DIAGNOSIS — K219 Gastro-esophageal reflux disease without esophagitis: Secondary | ICD-10-CM | POA: Diagnosis not present

## 2011-06-26 DIAGNOSIS — Z79899 Other long term (current) drug therapy: Secondary | ICD-10-CM | POA: Diagnosis not present

## 2011-06-26 DIAGNOSIS — K5730 Diverticulosis of large intestine without perforation or abscess without bleeding: Secondary | ICD-10-CM | POA: Diagnosis not present

## 2011-06-26 DIAGNOSIS — K644 Residual hemorrhoidal skin tags: Secondary | ICD-10-CM | POA: Diagnosis not present

## 2011-07-01 DIAGNOSIS — R928 Other abnormal and inconclusive findings on diagnostic imaging of breast: Secondary | ICD-10-CM | POA: Diagnosis not present

## 2011-07-11 ENCOUNTER — Encounter: Payer: Self-pay | Admitting: *Deleted

## 2011-07-12 ENCOUNTER — Ambulatory Visit (INDEPENDENT_AMBULATORY_CARE_PROVIDER_SITE_OTHER): Payer: Medicare Other | Admitting: Cardiovascular Disease

## 2011-07-12 ENCOUNTER — Encounter: Payer: Self-pay | Admitting: Cardiovascular Disease

## 2011-07-12 DIAGNOSIS — I447 Left bundle-branch block, unspecified: Secondary | ICD-10-CM

## 2011-07-12 DIAGNOSIS — R011 Cardiac murmur, unspecified: Secondary | ICD-10-CM | POA: Insufficient documentation

## 2011-07-12 DIAGNOSIS — R079 Chest pain, unspecified: Secondary | ICD-10-CM | POA: Insufficient documentation

## 2011-07-12 DIAGNOSIS — R0989 Other specified symptoms and signs involving the circulatory and respiratory systems: Secondary | ICD-10-CM | POA: Insufficient documentation

## 2011-07-12 DIAGNOSIS — I10 Essential (primary) hypertension: Secondary | ICD-10-CM

## 2011-07-12 NOTE — Patient Instructions (Signed)
Your physician has requested that you have a lexiscan myoview. For further information please visit [email or web address] Please follow instruction sheet, as given.  Your physician has requested that you have an echocardiogram. Echocardiography is a painless test that uses sound waves to create images of your heart. It provides your doctor with information about the size and shape of your heart and how well your heart's chambers and valves are working. This procedure takes approximately one hour. There are no restrictions for this procedure.  Your physician has requested that you have a carotid duplex. This test is an ultrasound of the carotid arteries in your neck. It looks at blood flow through these arteries that supply the brain with blood. Allow one hour for this exam. There are no restrictions or special instructions.  Your physician wants you to follow-up in: 6 months with Dr. Eden Emms. You will receive a reminder letter in the mail two months in advance. If you don't receive a letter, please call our office to schedule the follow-up appointment.

## 2011-07-12 NOTE — Assessment & Plan Note (Signed)
Check echo to R/O DCM.

## 2011-07-12 NOTE — Assessment & Plan Note (Signed)
ASA  F/U duplex to R/O progression of disease

## 2011-07-12 NOTE — Assessment & Plan Note (Signed)
History of LVH ? HOCM.  Not well characterized.  F/U echo

## 2011-07-12 NOTE — Assessment & Plan Note (Signed)
Atypical but LBBB, carotid bruit.  F/U Lexiscan myovue.  Cannnot walk well due to previous femoral neck fracture and has LBBB

## 2011-07-12 NOTE — Assessment & Plan Note (Signed)
Well controlled.  Continue current medications and low sodium Dash type diet.    

## 2011-07-12 NOTE — Progress Notes (Signed)
Patient ID: [name], female   DOB: September 10, 1940, 71 y.o.   MRN: [id number] 71 yo referred by Dr Marina Goodell in [place] for abnormal ECG.  Reviewed his records. ( time alone for this 15 minutes.)  Has new LBBB compared to ECG done 2012.  She has had a carotid duplex at [facility] for bruit.  40% bilateral disease  Occasional atypical chest pain around left breast.  Intermitant , nonexertional.  Denies palpitations or syncope.  Long standing HTN on RX.  Diet poor with continued salt intake.  No history of CAD, CHF or arrhythmia.  She has poor exercise tolerance and fatigues with exertion easily  Echo 2011 ? HOCM with small LVOT gradient.  Denies presyncope/syncope.    ROS: Denies fever, malais, weight loss, blurry vision, decreased visual acuity, cough, sputum, SOB, hemoptysis, pleuritic pain, palpitaitons, heartburn, abdominal pain, melena, lower extremity edema, claudication, or rash.  All other systems reviewed and negative   General: Affect appropriate Obese white femal HEENT: normal Neck supple with no adenopathy JVP normal bilateral carotid  bruits no thyromegaly Lungs clear with no wheezing and good diaphragmatic motion Heart:  S1/S2 SEM  murmur,rub, gallop or click PMI normal Abdomen: benighn, BS positve, no tenderness, no AAA no bruit.  No HSM or HJR Distal pulses intact with no bruits No edema Neuro non-focal Skin warm and dry No muscular weakness  Medications Current Outpatient Prescriptions  Medication Sig Dispense Refill  . aspirin 325 MG tablet Take 325 mg by mouth daily.      [name] escitalopram (LEXAPRO) 10 MG tablet Take 10 mg by mouth daily.      . furosemide (LASIX) 20 MG tablet Take 20 mg by mouth 2 (two) times daily.      [name] losartan (COZAAR) 100 MG tablet Take 100 mg by mouth daily.      . simvastatin (ZOCOR) 40 MG tablet Take 60 mg by mouth every evening.         Allergies Codeine  Family History: Family History  Problem Relation Age of Onset  . Hypertension Mother   .  Hypertension Father   . Heart attack Father   . Hypertension Brother   . Diabetes Brother     Social History: History   Social History  . Marital Status: Married    Spouse Name: N/A    Number of Children: N/A  . Years of Education: N/A   Occupational History  . Not on file.   Social History Main Topics  . Smoking status: Never Smoker   . Smokeless tobacco: Never Used  . Alcohol Use: No  . Drug Use: No  . Sexually Active:    Other Topics Concern  . Not on file   Social History Narrative  . No narrative on file    Electrocardiogram:  NSR 53 LBBB  06/06/11    Assessment and Plan

## 2011-07-18 DIAGNOSIS — H4010X0 Unspecified open-angle glaucoma, stage unspecified: Secondary | ICD-10-CM | POA: Diagnosis not present

## 2011-07-18 DIAGNOSIS — H04129 Dry eye syndrome of unspecified lacrimal gland: Secondary | ICD-10-CM | POA: Diagnosis not present

## 2011-07-18 DIAGNOSIS — H3530 Unspecified macular degeneration: Secondary | ICD-10-CM | POA: Diagnosis not present

## 2011-07-25 ENCOUNTER — Ambulatory Visit (HOSPITAL_COMMUNITY): Payer: Medicare Other | Attending: Cardiology

## 2011-07-25 ENCOUNTER — Other Ambulatory Visit: Payer: Self-pay

## 2011-07-25 DIAGNOSIS — R011 Cardiac murmur, unspecified: Secondary | ICD-10-CM | POA: Diagnosis not present

## 2011-07-25 DIAGNOSIS — I10 Essential (primary) hypertension: Secondary | ICD-10-CM | POA: Diagnosis not present

## 2011-07-25 DIAGNOSIS — R079 Chest pain, unspecified: Secondary | ICD-10-CM | POA: Diagnosis not present

## 2011-07-25 DIAGNOSIS — I447 Left bundle-branch block, unspecified: Secondary | ICD-10-CM | POA: Diagnosis not present

## 2011-07-25 DIAGNOSIS — I359 Nonrheumatic aortic valve disorder, unspecified: Secondary | ICD-10-CM | POA: Insufficient documentation

## 2011-07-25 DIAGNOSIS — I079 Rheumatic tricuspid valve disease, unspecified: Secondary | ICD-10-CM | POA: Diagnosis not present

## 2011-07-25 DIAGNOSIS — I059 Rheumatic mitral valve disease, unspecified: Secondary | ICD-10-CM | POA: Diagnosis not present

## 2011-08-01 ENCOUNTER — Ambulatory Visit (HOSPITAL_COMMUNITY): Payer: Medicare Other | Attending: Cardiovascular Disease | Admitting: Radiology

## 2011-08-01 DIAGNOSIS — R079 Chest pain, unspecified: Secondary | ICD-10-CM | POA: Insufficient documentation

## 2011-08-01 DIAGNOSIS — I779 Disorder of arteries and arterioles, unspecified: Secondary | ICD-10-CM | POA: Diagnosis not present

## 2011-08-01 DIAGNOSIS — R42 Dizziness and giddiness: Secondary | ICD-10-CM | POA: Insufficient documentation

## 2011-08-01 DIAGNOSIS — R9431 Abnormal electrocardiogram [ECG] [EKG]: Secondary | ICD-10-CM | POA: Diagnosis not present

## 2011-08-01 DIAGNOSIS — R5383 Other fatigue: Secondary | ICD-10-CM | POA: Insufficient documentation

## 2011-08-01 DIAGNOSIS — I10 Essential (primary) hypertension: Secondary | ICD-10-CM | POA: Insufficient documentation

## 2011-08-01 DIAGNOSIS — Z8249 Family history of ischemic heart disease and other diseases of the circulatory system: Secondary | ICD-10-CM | POA: Insufficient documentation

## 2011-08-01 DIAGNOSIS — R0609 Other forms of dyspnea: Secondary | ICD-10-CM | POA: Diagnosis not present

## 2011-08-01 DIAGNOSIS — R0602 Shortness of breath: Secondary | ICD-10-CM | POA: Diagnosis not present

## 2011-08-01 DIAGNOSIS — R5381 Other malaise: Secondary | ICD-10-CM | POA: Insufficient documentation

## 2011-08-01 DIAGNOSIS — R000 Tachycardia, unspecified: Secondary | ICD-10-CM | POA: Insufficient documentation

## 2011-08-01 DIAGNOSIS — R0989 Other specified symptoms and signs involving the circulatory and respiratory systems: Secondary | ICD-10-CM | POA: Insufficient documentation

## 2011-08-01 DIAGNOSIS — I447 Left bundle-branch block, unspecified: Secondary | ICD-10-CM | POA: Insufficient documentation

## 2011-08-01 DIAGNOSIS — R002 Palpitations: Secondary | ICD-10-CM | POA: Diagnosis not present

## 2011-08-01 MED ORDER — TECHNETIUM TC 99M TETROFOSMIN IV KIT
10.0000 | PACK | Freq: Once | INTRAVENOUS | Status: AC | PRN
  Administered 2011-08-01: 10 via INTRAVENOUS

## 2011-08-01 MED ORDER — TECHNETIUM TC 99M TETROFOSMIN IV KIT
30.0000 | PACK | Freq: Once | INTRAVENOUS | Status: AC | PRN
  Administered 2011-08-01: 30 via INTRAVENOUS

## 2011-08-01 MED ORDER — ADENOSINE (DIAGNOSTIC) 3 MG/ML IV SOLN
0.5600 mg/kg | Freq: Once | INTRAVENOUS | Status: AC
  Administered 2011-08-01: 43.8 mg via INTRAVENOUS

## 2011-08-01 NOTE — Progress Notes (Signed)
[facility] SITE 3 NUCLEAR MED [address] [place] [address] [phone]  Cardiology Nuclear Med Study  MCKYNLIE [name] is a 71 y.o. female     MRN : [id number]     DOB: [birth year]  Procedure Date: 08/01/2011  Nuclear Med Background Indication for Stress Test:  Evaluation for Ischemia and Abnormal EKG: New LBBB History:  08/2009 ECHO: EF: 75-85%,  Cardiac Risk Factors: Carotid Disease, Family History - CAD, Hypertension and LBBB  Symptoms:  Chest Pain, DOE, Fatigue, Light-Headedness, Palpitations, Rapid HR and SOB   Nuclear Pre-Procedure Caffeine/Decaff Intake:  None NPO After: 8:00pm   Lungs:  clear O2 Sat: 96% on room air. IV 0.9% NS with Angio Cath:  22g  IV Site: R Antecubital  IV Started by:  Stanton Kidney, EMT-P  Chest Size (in):  38 Cup Size: C  Height: 5' (1.524 m)  Weight:  172 lb (78.019 kg)  BMI:  Body mass index is 33.59 kg/(m^2). Tech Comments:  NA    Nuclear Med Study 1 or 2 day study: 1 day  Stress Test Type:  Adenosine  Reading MD: Marca Ancona, MD  Order Authorizing Provider:  P.Nishan  Resting Radionuclide: Technetium 88m Tetrofosmin  Resting Radionuclide Dose: 11.0 mCi   Stress Radionuclide:  Technetium 2m Tetrofosmin  Stress Radionuclide Dose: 33.0 mCi           Stress Protocol Rest HR: 55 Stress HR: 95  Rest BP: 155/84 Stress BP: 200/109  Exercise Time (min): n/a METS: n/a   Predicted Max HR: 150 bpm % Max HR: 63.33 bpm Rate Pressure Product: 41324   Dose of Adenosine (mg):  43.8 Dose of Lexiscan: n/a mg  Dose of Atropine (mg): n/a Dose of Dobutamine: n/a mcg/kg/min (at max HR)  Stress Test Technologist: Milana Na, EMT-P  Nuclear Technologist:  Domenic Polite, CNMT     Rest Procedure:  Myocardial perfusion imaging was performed at rest 45 minutes following the intravenous administration of Technetium 37m Tetrofosmin. Rest ECG: NSR-LBBB  Stress Procedure:  The patient received IV adenosine at 140 mcg/kg/min for 4  minutes.  There were non Dx 2nd to LBBB with infusion.  Technetium 60m Tetrofosmin was injected at the 2 minute mark and quantitative spect images were obtained after a 45 minute delay. Stress ECG: Uninteretable due to baseline LBBB  QPS Raw Data Images:  Normal; no motion artifact; normal heart/lung ratio. Stress Images:  Small, mild mid anteroseptal perfusion defect.  Rest Images:  Small, mild mid-anteroseptal perfusion defect, less pronounced than with stress.  Subtraction (SDS):  Small, partially reversible mid anteroseptal perfusion defect.  Transient Ischemic Dilatation (Normal <1.22):  1.04 Lung/Heart Ratio (Normal <0.45):  0.38  Quantitative Gated Spect Images QGS EDV:  80 ml QGS ESV:  28 ml  Impression Exercise Capacity:  Adenosine study with no exercise. BP Response:  Hypertensive blood pressure response. Clinical Symptoms:  Warm, short of breath, nausea.  ECG Impression:  No significant ST segment change suggestive of ischemia. Comparison with Prior Nuclear Study: No images to compare  Overall Impression:  Normal stress nuclear study.  There is a small, mild partially reversible mid anteroseptal perfusion defect that likely represents artifact from the LBBB.   LV Ejection Fraction: 65%.  LV Wall Motion:  NL LV Function; NL Wall Motion  [employer]

## 2011-08-06 ENCOUNTER — Telehealth: Payer: Self-pay | Admitting: Cardiovascular Disease

## 2011-08-06 NOTE — Telephone Encounter (Signed)
Fu call °Pt returning your call  °

## 2011-08-06 NOTE — Telephone Encounter (Signed)
PT AWARE OF MYOVIEW .[name]

## 2011-08-08 ENCOUNTER — Encounter (INDEPENDENT_AMBULATORY_CARE_PROVIDER_SITE_OTHER): Payer: Medicare Other

## 2011-08-08 ENCOUNTER — Encounter: Payer: Self-pay | Admitting: Cardiovascular Disease

## 2011-08-08 DIAGNOSIS — R0989 Other specified symptoms and signs involving the circulatory and respiratory systems: Secondary | ICD-10-CM | POA: Diagnosis not present

## 2011-08-08 DIAGNOSIS — I6529 Occlusion and stenosis of unspecified carotid artery: Secondary | ICD-10-CM

## 2011-08-12 DIAGNOSIS — L299 Pruritus, unspecified: Secondary | ICD-10-CM | POA: Diagnosis not present

## 2011-08-12 DIAGNOSIS — L255 Unspecified contact dermatitis due to plants, except food: Secondary | ICD-10-CM | POA: Diagnosis not present

## 2011-10-08 DIAGNOSIS — D649 Anemia, unspecified: Secondary | ICD-10-CM | POA: Diagnosis not present

## 2011-10-10 DIAGNOSIS — D509 Iron deficiency anemia, unspecified: Secondary | ICD-10-CM | POA: Diagnosis not present

## 2011-11-28 DIAGNOSIS — E785 Hyperlipidemia, unspecified: Secondary | ICD-10-CM | POA: Diagnosis not present

## 2011-11-28 DIAGNOSIS — I10 Essential (primary) hypertension: Secondary | ICD-10-CM | POA: Diagnosis not present

## 2012-01-01 ENCOUNTER — Encounter: Payer: Self-pay | Admitting: Vascular Surgery

## 2012-01-09 DIAGNOSIS — H4010X0 Unspecified open-angle glaucoma, stage unspecified: Secondary | ICD-10-CM | POA: Diagnosis not present

## 2012-01-09 DIAGNOSIS — H3530 Unspecified macular degeneration: Secondary | ICD-10-CM | POA: Diagnosis not present

## 2012-01-09 DIAGNOSIS — H04129 Dry eye syndrome of unspecified lacrimal gland: Secondary | ICD-10-CM | POA: Diagnosis not present

## 2012-02-05 DIAGNOSIS — M25569 Pain in unspecified knee: Secondary | ICD-10-CM | POA: Diagnosis not present

## 2012-02-05 DIAGNOSIS — M1710 Unilateral primary osteoarthritis, unspecified knee: Secondary | ICD-10-CM | POA: Diagnosis not present

## 2012-02-26 ENCOUNTER — Other Ambulatory Visit: Payer: Self-pay | Admitting: Cardiology

## 2012-02-26 DIAGNOSIS — I6529 Occlusion and stenosis of unspecified carotid artery: Secondary | ICD-10-CM

## 2012-02-26 DIAGNOSIS — R0989 Other specified symptoms and signs involving the circulatory and respiratory systems: Secondary | ICD-10-CM

## 2012-03-09 DIAGNOSIS — J209 Acute bronchitis, unspecified: Secondary | ICD-10-CM | POA: Diagnosis not present

## 2012-03-09 DIAGNOSIS — E785 Hyperlipidemia, unspecified: Secondary | ICD-10-CM | POA: Diagnosis not present

## 2012-03-09 DIAGNOSIS — D518 Other vitamin B12 deficiency anemias: Secondary | ICD-10-CM | POA: Diagnosis not present

## 2012-03-09 DIAGNOSIS — E782 Mixed hyperlipidemia: Secondary | ICD-10-CM | POA: Diagnosis not present

## 2012-03-09 DIAGNOSIS — I10 Essential (primary) hypertension: Secondary | ICD-10-CM | POA: Diagnosis not present

## 2012-03-13 ENCOUNTER — Encounter (INDEPENDENT_AMBULATORY_CARE_PROVIDER_SITE_OTHER): Payer: Medicare Other

## 2012-03-13 DIAGNOSIS — R0989 Other specified symptoms and signs involving the circulatory and respiratory systems: Secondary | ICD-10-CM

## 2012-03-13 DIAGNOSIS — I6529 Occlusion and stenosis of unspecified carotid artery: Secondary | ICD-10-CM | POA: Diagnosis not present

## 2012-03-13 DIAGNOSIS — M25569 Pain in unspecified knee: Secondary | ICD-10-CM | POA: Diagnosis not present

## 2012-03-13 DIAGNOSIS — M1710 Unilateral primary osteoarthritis, unspecified knee: Secondary | ICD-10-CM | POA: Diagnosis not present

## 2012-03-20 ENCOUNTER — Telehealth: Payer: Self-pay | Admitting: Cardiovascular Disease

## 2012-03-20 NOTE — Telephone Encounter (Signed)
PT AWARE OF CAROTID RESULTS ./CY 

## 2012-03-20 NOTE — Telephone Encounter (Signed)
New problem:   Stating someone called her today .

## 2012-03-27 DIAGNOSIS — M1710 Unilateral primary osteoarthritis, unspecified knee: Secondary | ICD-10-CM | POA: Diagnosis not present

## 2012-04-02 DIAGNOSIS — M7989 Other specified soft tissue disorders: Secondary | ICD-10-CM | POA: Diagnosis not present

## 2012-04-02 DIAGNOSIS — M25569 Pain in unspecified knee: Secondary | ICD-10-CM | POA: Diagnosis not present

## 2012-04-02 DIAGNOSIS — M1710 Unilateral primary osteoarthritis, unspecified knee: Secondary | ICD-10-CM | POA: Diagnosis not present

## 2012-04-02 DIAGNOSIS — IMO0002 Reserved for concepts with insufficient information to code with codable children: Secondary | ICD-10-CM | POA: Diagnosis not present

## 2012-04-09 DIAGNOSIS — M7120 Synovial cyst of popliteal space [Baker], unspecified knee: Secondary | ICD-10-CM | POA: Diagnosis not present

## 2012-04-09 DIAGNOSIS — M25569 Pain in unspecified knee: Secondary | ICD-10-CM | POA: Diagnosis not present

## 2012-04-09 DIAGNOSIS — M1710 Unilateral primary osteoarthritis, unspecified knee: Secondary | ICD-10-CM | POA: Diagnosis not present

## 2012-06-18 DIAGNOSIS — I10 Essential (primary) hypertension: Secondary | ICD-10-CM | POA: Diagnosis not present

## 2012-06-18 DIAGNOSIS — E785 Hyperlipidemia, unspecified: Secondary | ICD-10-CM | POA: Diagnosis not present

## 2012-06-25 DIAGNOSIS — M1710 Unilateral primary osteoarthritis, unspecified knee: Secondary | ICD-10-CM | POA: Diagnosis not present

## 2012-06-25 DIAGNOSIS — N63 Unspecified lump in unspecified breast: Secondary | ICD-10-CM | POA: Diagnosis not present

## 2012-07-16 DIAGNOSIS — H04129 Dry eye syndrome of unspecified lacrimal gland: Secondary | ICD-10-CM | POA: Diagnosis not present

## 2012-07-16 DIAGNOSIS — H3530 Unspecified macular degeneration: Secondary | ICD-10-CM | POA: Diagnosis not present

## 2012-07-16 DIAGNOSIS — H4010X0 Unspecified open-angle glaucoma, stage unspecified: Secondary | ICD-10-CM | POA: Diagnosis not present

## 2012-07-31 DIAGNOSIS — M1710 Unilateral primary osteoarthritis, unspecified knee: Secondary | ICD-10-CM | POA: Diagnosis not present

## 2012-08-06 DIAGNOSIS — M1710 Unilateral primary osteoarthritis, unspecified knee: Secondary | ICD-10-CM | POA: Diagnosis not present

## 2012-08-14 DIAGNOSIS — M1710 Unilateral primary osteoarthritis, unspecified knee: Secondary | ICD-10-CM | POA: Diagnosis not present

## 2012-08-14 DIAGNOSIS — M169 Osteoarthritis of hip, unspecified: Secondary | ICD-10-CM | POA: Diagnosis not present

## 2012-09-08 DIAGNOSIS — S2239XA Fracture of one rib, unspecified side, initial encounter for closed fracture: Secondary | ICD-10-CM | POA: Diagnosis not present

## 2012-09-18 DIAGNOSIS — S2239XA Fracture of one rib, unspecified side, initial encounter for closed fracture: Secondary | ICD-10-CM | POA: Diagnosis not present

## 2012-09-28 DIAGNOSIS — R918 Other nonspecific abnormal finding of lung field: Secondary | ICD-10-CM | POA: Diagnosis not present

## 2012-09-28 DIAGNOSIS — K449 Diaphragmatic hernia without obstruction or gangrene: Secondary | ICD-10-CM | POA: Diagnosis not present

## 2012-09-28 DIAGNOSIS — R079 Chest pain, unspecified: Secondary | ICD-10-CM | POA: Diagnosis not present

## 2012-09-28 DIAGNOSIS — S2239XA Fracture of one rib, unspecified side, initial encounter for closed fracture: Secondary | ICD-10-CM | POA: Diagnosis not present

## 2012-10-15 ENCOUNTER — Encounter (INDEPENDENT_AMBULATORY_CARE_PROVIDER_SITE_OTHER): Payer: Medicare Other

## 2012-10-15 DIAGNOSIS — I6529 Occlusion and stenosis of unspecified carotid artery: Secondary | ICD-10-CM | POA: Diagnosis not present

## 2012-10-22 ENCOUNTER — Telehealth: Payer: Self-pay | Admitting: Cardiovascular Disease

## 2012-10-22 DIAGNOSIS — I10 Essential (primary) hypertension: Secondary | ICD-10-CM | POA: Diagnosis not present

## 2012-10-22 DIAGNOSIS — I428 Other cardiomyopathies: Secondary | ICD-10-CM | POA: Diagnosis not present

## 2012-10-22 DIAGNOSIS — R5382 Chronic fatigue, unspecified: Secondary | ICD-10-CM | POA: Diagnosis not present

## 2012-10-22 DIAGNOSIS — J45909 Unspecified asthma, uncomplicated: Secondary | ICD-10-CM | POA: Diagnosis not present

## 2012-10-22 DIAGNOSIS — E785 Hyperlipidemia, unspecified: Secondary | ICD-10-CM | POA: Diagnosis not present

## 2012-10-22 DIAGNOSIS — R5600 Simple febrile convulsions: Secondary | ICD-10-CM | POA: Diagnosis not present

## 2012-10-22 NOTE — Telephone Encounter (Signed)
Follow up  Pt is returning your call regarding some test results.

## 2012-10-22 NOTE — Telephone Encounter (Signed)
PT AWARE OF CAROTID RESULTS ./CY 

## 2012-11-03 ENCOUNTER — Ambulatory Visit (INDEPENDENT_AMBULATORY_CARE_PROVIDER_SITE_OTHER): Payer: Medicare Other | Admitting: Cardiovascular Disease

## 2012-11-03 ENCOUNTER — Encounter: Payer: Self-pay | Admitting: Cardiovascular Disease

## 2012-11-03 VITALS — BP 160/90 | HR 66 | Wt 176.0 lb

## 2012-11-03 DIAGNOSIS — R079 Chest pain, unspecified: Secondary | ICD-10-CM | POA: Diagnosis not present

## 2012-11-03 DIAGNOSIS — I447 Left bundle-branch block, unspecified: Secondary | ICD-10-CM | POA: Diagnosis not present

## 2012-11-03 DIAGNOSIS — R0989 Other specified symptoms and signs involving the circulatory and respiratory systems: Secondary | ICD-10-CM | POA: Diagnosis not present

## 2012-11-03 DIAGNOSIS — I10 Essential (primary) hypertension: Secondary | ICD-10-CM

## 2012-11-03 NOTE — Assessment & Plan Note (Signed)
Atypical Recently related to fall and rib fractures.  Adenosine myovue normal last year

## 2012-11-03 NOTE — Patient Instructions (Signed)
Your physician wants you to follow-up in: January   WITH DR Eden Emms AND CAROTID SAME DAY  You will receive a reminder letter in the mail two months in advance. If you don't receive a letter, please call our office to schedule the follow-up appointment. Your physician recommends that you continue on your current medications as directed. Please refer to the Current Medication list given to you today. Your physician has requested that you have a carotid duplex. This test is an ultrasound of the carotid arteries in your neck. It looks at blood flow through these arteries that supply the brain with blood. Allow one hour for this exam. There are no restrictions or special instructions.

## 2012-11-03 NOTE — Progress Notes (Signed)
Patient ID: UCHENNA SEUFERT, female   DOB: Jul 31, 1940, 72 y.o.   MRN: [id number] 72 yo referred by Dr Marina Goodell in [place] for abnormal ECG. Reviewed his records. ( time alone for this 15 minutes.) Has new LBBB compared to ECG done 2012. She has had a carotid duplex at [facility] for bruit. 60-=79% disease Occasional atypical chest pain around left breast. Intermitant , nonexertional. Denies palpitations or syncope. Long standing HTN on RX. Diet poor with continued salt intake. No history of CAD, CHF or arrhythmia. She has poor exercise tolerance and fatigues with exertion easily Echo 2011 ? HOCM with small LVOT gradient. Denies presyncope/syncope.   Myouve 08/01/11  Small anteroseptal defect from LBBB EF normal   Recent fall with respitory congestion ? Broken ribs bilaterally  Seeing Dr Marina Goodell  Still with cough and dyspnea despite doxycyline for 2 weeks Had CT in [place] Suggested she see our pulmonary doctors if needed  ROS: Denies fever, malais, weight loss, blurry vision, decreased visual acuity, cough, sputum, SOB, hemoptysis, pleuritic pain, palpitaitons, heartburn, abdominal pain, melena, lower extremity edema, claudication, or rash.  All other systems reviewed and negative  General: Affect appropriate Healthy:  appears stated age HEENT: normal Neck supple with no adenopathy JVP normal bilateral bruits no thyromegaly Lungs clear with no wheezing and good diaphragmatic motion Heart:  S1/S2 no murmur, no rub, gallop or click PMI normal Abdomen: benighn, BS positve, no tenderness, no AAA no bruit.  No HSM or HJR Distal pulses intact with no bruits No edema Neuro non-focal Skin warm and dry No muscular weakness   Current Outpatient Prescriptions  Medication Sig Dispense Refill  . aspirin 325 MG tablet Take 325 mg by mouth daily.      [name] escitalopram (LEXAPRO) 10 MG tablet Take 10 mg by mouth daily.      . furosemide (LASIX) 20 MG tablet Take 20 mg by mouth 2 (two) times daily.      [name] losartan  (COZAAR) 100 MG tablet Take 100 mg by mouth daily.       No current facility-administered medications for this visit.    Allergies  Codeine  Electrocardiogram:  3/14/  SR LBBB rate 53  Assessment and Plan

## 2012-11-03 NOTE — Assessment & Plan Note (Signed)
Well controlled.  Continue current medications and low sodium Dash type diet.    

## 2012-11-03 NOTE — Assessment & Plan Note (Signed)
STable no AV block or presyncope EF ok by myovue

## 2012-11-03 NOTE — Assessment & Plan Note (Signed)
F/U duplex in January Moderate bilateral disease ASA

## 2012-11-04 ENCOUNTER — Encounter: Payer: Self-pay | Admitting: Cardiovascular Disease

## 2012-11-13 DIAGNOSIS — Z13820 Encounter for screening for osteoporosis: Secondary | ICD-10-CM | POA: Diagnosis not present

## 2012-11-13 DIAGNOSIS — S2239XA Fracture of one rib, unspecified side, initial encounter for closed fracture: Secondary | ICD-10-CM | POA: Diagnosis not present

## 2012-12-10 DIAGNOSIS — H4010X0 Unspecified open-angle glaucoma, stage unspecified: Secondary | ICD-10-CM | POA: Diagnosis not present

## 2012-12-10 DIAGNOSIS — H04129 Dry eye syndrome of unspecified lacrimal gland: Secondary | ICD-10-CM | POA: Diagnosis not present

## 2013-04-22 ENCOUNTER — Encounter (HOSPITAL_COMMUNITY): Payer: Medicare Other

## 2013-04-23 ENCOUNTER — Ambulatory Visit (INDEPENDENT_AMBULATORY_CARE_PROVIDER_SITE_OTHER): Payer: Medicare Other | Admitting: Cardiovascular Disease

## 2013-04-23 ENCOUNTER — Encounter: Payer: Self-pay | Admitting: Cardiovascular Disease

## 2013-04-23 ENCOUNTER — Encounter: Payer: Self-pay | Admitting: Internal Medicine

## 2013-04-23 ENCOUNTER — Encounter (INDEPENDENT_AMBULATORY_CARE_PROVIDER_SITE_OTHER): Payer: Self-pay

## 2013-04-23 ENCOUNTER — Ambulatory Visit (HOSPITAL_COMMUNITY): Payer: Medicare Other | Attending: Internal Medicine

## 2013-04-23 VITALS — BP 152/80 | HR 64 | Ht 60.0 in | Wt 171.0 lb

## 2013-04-23 DIAGNOSIS — I6529 Occlusion and stenosis of unspecified carotid artery: Secondary | ICD-10-CM | POA: Insufficient documentation

## 2013-04-23 DIAGNOSIS — I447 Left bundle-branch block, unspecified: Secondary | ICD-10-CM | POA: Diagnosis not present

## 2013-04-23 DIAGNOSIS — I658 Occlusion and stenosis of other precerebral arteries: Secondary | ICD-10-CM | POA: Insufficient documentation

## 2013-04-23 DIAGNOSIS — R011 Cardiac murmur, unspecified: Secondary | ICD-10-CM

## 2013-04-23 DIAGNOSIS — R0989 Other specified symptoms and signs involving the circulatory and respiratory systems: Secondary | ICD-10-CM

## 2013-04-23 DIAGNOSIS — I10 Essential (primary) hypertension: Secondary | ICD-10-CM | POA: Insufficient documentation

## 2013-04-23 NOTE — Assessment & Plan Note (Signed)
Well controlled.  Continue current medications and low sodium Dash type diet.    

## 2013-04-23 NOTE — Assessment & Plan Note (Signed)
Mild AS f/u echo in May

## 2013-04-23 NOTE — Assessment & Plan Note (Signed)
STable no high grade AV block Yearly ECG

## 2013-04-23 NOTE — Patient Instructions (Signed)
Your physician wants you to follow-up in:   MAY  WITH  DR Eden Emms  ECHO  SAME DAY  You will receive a reminder letter in the mail two months in advance. If you don't receive a letter, please call our office to schedule the follow-up appointment. Your physician recommends that you continue on your current medications as directed. Please refer to the Current Medication list given to you today.  Your physician has requested that you have an echocardiogram. Echocardiography is a painless test that uses sound waves to create images of your heart. It provides your doctor with information about the size and shape of your heart and how well your heart's chambers and valves are working. This procedure takes approximately one hour. There are no restrictions for this procedure. MAY  SEE DR Eden Emms  SAME DAY

## 2013-04-23 NOTE — Progress Notes (Signed)
Patient ID: [name], female   DOB: 1940/03/27, 73 y.o.   MRN: [id number] 73 yo referred by Dr Marina Goodell in [place] for abnormal ECG. Reviewed his records. ( time alone for this 15 minutes.) Has new LBBB compared to ECG done 2012. She has had a carotid duplex at [facility] for bruit. 60-=79% disease Occasional atypical chest pain around left breast. Intermitant , nonexertional. Denies palpitations or syncope. Long standing HTN on RX. Diet poor with continued salt intake. No history of CAD, CHF or arrhythmia. She has poor exercise tolerance and fatigues with exertion easily Echo 2011 ? HOCM with small LVOT gradient. Denies presyncope/syncope.   5/13 Low risk myovue Overall Impression: Normal stress nuclear study. There is a small, mild partially reversible mid anteroseptal perfusion defect that likely represents artifact from the LBBB.  LV Ejection Fraction: 65%. LV Wall Motion: NL LV Function; NL Wall Motion  Echo 5/13  Mild AS mean gradient 7 peak 13 normal EF  Study Conclusions  - Left ventricle: The cavity size was normal. Wall thickness was increased in a pattern of moderate LVH. There was mild focal basal hypertrophy of the septum. Systolic function was normal. The estimated ejection fraction was in the range of 60% to 65%. Wall motion was normal; there were no regional wall motion abnormalities. Doppler parameters are consistent with abnormal left ventricular relaxation (grade 1 diastolic dysfunction). 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 - Mitral valve: Mild regurgitation.  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  All other systems reviewed and negative  General: Affect appropriate Healthy:  appears stated age HEENT: normal Neck supple with  no adenopathy JVP normal bilateral  bruits no thyromegaly Lungs clear with no wheezing and good diaphragmatic motion Heart:  S1/S2 mild AS  murmur, no rub, gallop or click PMI normal Abdomen: benighn, BS positve, no tenderness, no AAA no bruit.  No HSM or HJR Distal pulses intact with no bruits No edema Neuro non-focal Skin warm and dry No muscular weakness   Current Outpatient Prescriptions  Medication Sig Dispense Refill  . aspirin 325 MG tablet Take 325 mg by mouth daily.      [name] escitalopram (LEXAPRO) 10 MG tablet Take 10 mg by mouth daily.      . furosemide (LASIX) 20 MG tablet Take 20 mg by mouth as needed.       [name] losartan (COZAAR) 100 MG tablet Take 100 mg by mouth daily.       No current facility-administered medications for this visit.    Allergies  Codeine  Electrocardiogram:   11/03/12 SR rate 64 LBBB   Assessment and Plan

## 2013-04-23 NOTE — Assessment & Plan Note (Signed)
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  No TIA symptoms.  Continue antiplatelet Rx and F/U carotid duplex in 6 months   

## 2013-07-16 DIAGNOSIS — Z1231 Encounter for screening mammogram for malignant neoplasm of breast: Secondary | ICD-10-CM | POA: Diagnosis not present

## 2013-07-27 DIAGNOSIS — R0789 Other chest pain: Secondary | ICD-10-CM | POA: Diagnosis not present

## 2013-07-27 DIAGNOSIS — D649 Anemia, unspecified: Secondary | ICD-10-CM | POA: Diagnosis not present

## 2013-07-27 DIAGNOSIS — I10 Essential (primary) hypertension: Secondary | ICD-10-CM | POA: Diagnosis not present

## 2013-07-27 DIAGNOSIS — E785 Hyperlipidemia, unspecified: Secondary | ICD-10-CM | POA: Diagnosis not present

## 2013-08-13 ENCOUNTER — Encounter: Payer: Self-pay | Admitting: Cardiovascular Disease

## 2013-08-13 ENCOUNTER — Ambulatory Visit (HOSPITAL_COMMUNITY): Payer: Medicare Other | Attending: Cardiology | Admitting: Radiology

## 2013-08-13 ENCOUNTER — Ambulatory Visit (INDEPENDENT_AMBULATORY_CARE_PROVIDER_SITE_OTHER): Payer: Medicare Other | Admitting: Cardiovascular Disease

## 2013-08-13 VITALS — BP 197/90 | HR 60 | Ht 60.0 in | Wt 168.4 lb

## 2013-08-13 DIAGNOSIS — I359 Nonrheumatic aortic valve disorder, unspecified: Secondary | ICD-10-CM | POA: Diagnosis not present

## 2013-08-13 DIAGNOSIS — R0989 Other specified symptoms and signs involving the circulatory and respiratory systems: Secondary | ICD-10-CM | POA: Diagnosis not present

## 2013-08-13 DIAGNOSIS — R079 Chest pain, unspecified: Secondary | ICD-10-CM

## 2013-08-13 DIAGNOSIS — R011 Cardiac murmur, unspecified: Secondary | ICD-10-CM | POA: Insufficient documentation

## 2013-08-13 DIAGNOSIS — I10 Essential (primary) hypertension: Secondary | ICD-10-CM | POA: Diagnosis not present

## 2013-08-13 DIAGNOSIS — I350 Nonrheumatic aortic (valve) stenosis: Secondary | ICD-10-CM | POA: Insufficient documentation

## 2013-08-13 DIAGNOSIS — I447 Left bundle-branch block, unspecified: Secondary | ICD-10-CM | POA: Diagnosis not present

## 2013-08-13 NOTE — Assessment & Plan Note (Signed)
REsolved normal myovue observe

## 2013-08-13 NOTE — Assessment & Plan Note (Signed)
No change in gradient on echo today stable f/u visit in a year

## 2013-08-13 NOTE — Progress Notes (Signed)
Echocardiogram performed.  

## 2013-08-13 NOTE — Patient Instructions (Addendum)
Your physician wants you to follow-up in:   YEAR WITH  DR Haywood Filler will receive a reminder letter in the mail two months in advance. If you don't receive a letter, please call our office to schedule the follow-up appointment. Your physician recommends that you continue on your current medications as directed. Please refer to the Current Medication list given to you today. Your physician has requested that you have a carotid duplex. This test is an ultrasound of the carotid arteries in your neck. It looks at blood flow through these arteries that supply the brain with blood. Allow one hour for this exam. There are no restrictions or special instructions. DUE IN July 2015

## 2013-08-13 NOTE — Assessment & Plan Note (Signed)
Well controlled.  Continue current medications and low sodium Dash type diet.    

## 2013-08-13 NOTE — Assessment & Plan Note (Signed)
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

## 2013-08-13 NOTE — Progress Notes (Signed)
Patient ID: [name], female   DOB: [birth year], 73 y.o.   MRN: [id number] 73 yo referred by Dr Marina Goodell in [place] for abnormal ECG. Reviewed his records. ( time alone for this 15 minutes.) Has new LBBB compared to ECG done 2012. She has had a carotid duplex at [facility] for bruit. 60-=79% disease Occasional atypical chest pain around left breast. Intermitant , nonexertional. Denies palpitations or syncope. Long standing HTN on RX. Diet poor with continued salt intake. No history of CAD, CHF or arrhythmia. She has poor exercise tolerance and fatigues with exertion easily Echo 2011 ? HOCM with small LVOT gradient. Denies presyncope/syncope.  5/13 Low risk myovue  Overall Impression: Normal stress nuclear study. There is a small, mild partially reversible mid anteroseptal perfusion defect that likely represents artifact from the LBBB.  LV Ejection Fraction: 65%. LV Wall Motion: NL LV Function; NL Wall Motion  Echo 5/13 Mild AS mean gradient 7 peak 13 normal EF  Study Conclusions  - Left ventricle: The cavity size was normal. Wall thickness was increased in a pattern of moderate LVH. There was mild focal basal hypertrophy of the septum. Systolic function was normal. The estimated ejection fraction was in the range of 60% to 65%. Wall motion was normal; there were no regional wall motion abnormalities. Doppler parameters are consistent with abnormal left ventricular relaxation (grade 1 diastolic dysfunction). 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 - Mitral valve: Mild regurgitation.  Duplex from 04/24/13 reviewed stable 60-79% LICA stenosis   Still with some dyspnea on exertion No TIAls compliant with meds   Reviewed echo from today moderate LVH mild AS really no change Diastolic relaxation abnormality   ROS: Denies fever, malais, weight loss, blurry vision, decreased visual acuity, cough, sputum, SOB, hemoptysis, pleuritic pain, palpitaitons, heartburn, abdominal pain, melena, lower extremity  edema, claudication, or rash.  All other systems reviewed and negative  General: Affect appropriate Healthy:  appears stated age HEENT: normal Neck supple with no adenopathy JVP normal right  bruits no thyromegaly Lungs clear with no wheezing and good diaphragmatic motion Heart:  S1/S2 mild AS  murmur, no rub, gallop or click PMI normal Abdomen: benighn, BS positve, no tenderness, no AAA no bruit.  No HSM or HJR Distal pulses intact with no bruits No edema Neuro non-focal Skin warm and dry No muscular weakness   Current Outpatient Prescriptions  Medication Sig Dispense Refill  . aspirin 325 MG tablet Take 325 mg by mouth daily.      [name] escitalopram (LEXAPRO) 10 MG tablet Take 10 mg by mouth daily.      . furosemide (LASIX) 20 MG tablet Take 20 mg by mouth as needed.       [name] losartan (COZAAR) 100 MG tablet Take 100 mg by mouth daily.      . Ranitidine HCl (ZANTAC PO) Take by mouth daily.       No current facility-administered medications for this visit.    Allergies  Codeine  Electrocardiogram:  SR LBBB   Assessment and Plan

## 2013-08-13 NOTE — Assessment & Plan Note (Signed)
No change yearly ECG no evidence of high grade heart block

## 2013-08-26 ENCOUNTER — Ambulatory Visit (HOSPITAL_COMMUNITY): Payer: Medicare Other

## 2013-09-14 DIAGNOSIS — D509 Iron deficiency anemia, unspecified: Secondary | ICD-10-CM | POA: Diagnosis not present

## 2013-09-16 DIAGNOSIS — D509 Iron deficiency anemia, unspecified: Secondary | ICD-10-CM | POA: Diagnosis not present

## 2013-11-04 ENCOUNTER — Other Ambulatory Visit (HOSPITAL_COMMUNITY): Payer: Self-pay | Admitting: Cardiology

## 2013-11-04 DIAGNOSIS — I6529 Occlusion and stenosis of unspecified carotid artery: Secondary | ICD-10-CM

## 2013-11-11 ENCOUNTER — Encounter (HOSPITAL_COMMUNITY): Payer: Medicare Other

## 2013-11-15 DIAGNOSIS — D649 Anemia, unspecified: Secondary | ICD-10-CM | POA: Diagnosis not present

## 2013-11-17 DIAGNOSIS — D509 Iron deficiency anemia, unspecified: Secondary | ICD-10-CM | POA: Diagnosis not present

## 2013-11-18 ENCOUNTER — Ambulatory Visit (HOSPITAL_COMMUNITY): Payer: Medicare Other | Attending: Cardiovascular Disease | Admitting: Cardiology

## 2013-11-18 ENCOUNTER — Encounter (HOSPITAL_COMMUNITY): Payer: Medicare Other

## 2013-11-18 DIAGNOSIS — I10 Essential (primary) hypertension: Secondary | ICD-10-CM | POA: Insufficient documentation

## 2013-11-18 DIAGNOSIS — I6529 Occlusion and stenosis of unspecified carotid artery: Secondary | ICD-10-CM | POA: Insufficient documentation

## 2013-11-18 DIAGNOSIS — Z7901 Long term (current) use of anticoagulants: Secondary | ICD-10-CM | POA: Diagnosis not present

## 2013-11-18 DIAGNOSIS — I658 Occlusion and stenosis of other precerebral arteries: Secondary | ICD-10-CM | POA: Diagnosis not present

## 2013-11-18 DIAGNOSIS — R0989 Other specified symptoms and signs involving the circulatory and respiratory systems: Secondary | ICD-10-CM | POA: Insufficient documentation

## 2013-11-18 NOTE — Progress Notes (Signed)
Carotid duplex performed 

## 2013-12-09 DIAGNOSIS — E785 Hyperlipidemia, unspecified: Secondary | ICD-10-CM | POA: Diagnosis not present

## 2013-12-09 DIAGNOSIS — I10 Essential (primary) hypertension: Secondary | ICD-10-CM | POA: Diagnosis not present

## 2013-12-09 DIAGNOSIS — Z6833 Body mass index (BMI) 33.0-33.9, adult: Secondary | ICD-10-CM | POA: Diagnosis not present

## 2013-12-09 DIAGNOSIS — D649 Anemia, unspecified: Secondary | ICD-10-CM | POA: Diagnosis not present

## 2013-12-24 DIAGNOSIS — H4011X1 Primary open-angle glaucoma, mild stage: Secondary | ICD-10-CM | POA: Diagnosis not present

## 2014-03-17 DIAGNOSIS — D509 Iron deficiency anemia, unspecified: Secondary | ICD-10-CM | POA: Diagnosis not present

## 2014-03-24 DIAGNOSIS — H4011X1 Primary open-angle glaucoma, mild stage: Secondary | ICD-10-CM | POA: Diagnosis not present

## 2014-03-28 DIAGNOSIS — D509 Iron deficiency anemia, unspecified: Secondary | ICD-10-CM | POA: Diagnosis not present

## 2014-05-25 DIAGNOSIS — H4011X1 Primary open-angle glaucoma, mild stage: Secondary | ICD-10-CM | POA: Diagnosis not present

## 2014-06-09 DIAGNOSIS — Z6834 Body mass index (BMI) 34.0-34.9, adult: Secondary | ICD-10-CM | POA: Diagnosis not present

## 2014-06-09 DIAGNOSIS — E785 Hyperlipidemia, unspecified: Secondary | ICD-10-CM | POA: Diagnosis not present

## 2014-06-09 DIAGNOSIS — H409 Unspecified glaucoma: Secondary | ICD-10-CM | POA: Diagnosis not present

## 2014-06-09 DIAGNOSIS — I447 Left bundle-branch block, unspecified: Secondary | ICD-10-CM | POA: Diagnosis not present

## 2014-06-09 DIAGNOSIS — I10 Essential (primary) hypertension: Secondary | ICD-10-CM | POA: Diagnosis not present

## 2014-06-14 DIAGNOSIS — M25571 Pain in right ankle and joints of right foot: Secondary | ICD-10-CM | POA: Diagnosis not present

## 2014-06-30 DIAGNOSIS — M25571 Pain in right ankle and joints of right foot: Secondary | ICD-10-CM | POA: Diagnosis not present

## 2014-07-27 ENCOUNTER — Telehealth: Payer: Self-pay | Admitting: Cardiovascular Disease

## 2014-07-27 DIAGNOSIS — M25561 Pain in right knee: Secondary | ICD-10-CM | POA: Diagnosis not present

## 2014-07-27 DIAGNOSIS — M25562 Pain in left knee: Secondary | ICD-10-CM | POA: Diagnosis not present

## 2014-07-27 NOTE — Telephone Encounter (Signed)
error 

## 2014-08-04 DIAGNOSIS — Z1231 Encounter for screening mammogram for malignant neoplasm of breast: Secondary | ICD-10-CM | POA: Diagnosis not present

## 2014-09-01 DIAGNOSIS — M1711 Unilateral primary osteoarthritis, right knee: Secondary | ICD-10-CM | POA: Diagnosis not present

## 2014-09-09 DIAGNOSIS — M1711 Unilateral primary osteoarthritis, right knee: Secondary | ICD-10-CM | POA: Diagnosis not present

## 2014-09-16 DIAGNOSIS — M1711 Unilateral primary osteoarthritis, right knee: Secondary | ICD-10-CM | POA: Diagnosis not present

## 2014-09-16 DIAGNOSIS — M25561 Pain in right knee: Secondary | ICD-10-CM | POA: Diagnosis not present

## 2014-09-29 DIAGNOSIS — D509 Iron deficiency anemia, unspecified: Secondary | ICD-10-CM | POA: Diagnosis not present

## 2014-10-05 ENCOUNTER — Other Ambulatory Visit: Payer: Self-pay | Admitting: Cardiovascular Disease

## 2014-10-05 DIAGNOSIS — I6523 Occlusion and stenosis of bilateral carotid arteries: Secondary | ICD-10-CM

## 2014-10-06 DIAGNOSIS — D509 Iron deficiency anemia, unspecified: Secondary | ICD-10-CM | POA: Diagnosis not present

## 2014-10-07 DIAGNOSIS — I10 Essential (primary) hypertension: Secondary | ICD-10-CM | POA: Diagnosis not present

## 2014-10-10 NOTE — Progress Notes (Signed)
Patient ID: [name], female   DOB: Sep 22, 1940, 74 y.o.   MRN: [id number] 74 yo referred by Dr Marina Goodell in [place] for abnormal ECG. Reviewed his records. ( time alone for this 15 minutes.) Has new LBBB compared to ECG done 2012. She has had a carotid duplex at [facility] for bruit. 60-=79% disease Occasional atypical chest pain around left breast. Intermitant , nonexertional. Denies palpitations or syncope. Long standing HTN on RX. Diet poor with continued salt intake. No history of CAD, CHF or arrhythmia. She has poor exercise tolerance and fatigues with exertion easily Echo 2011 ? HOCM with small LVOT gradient. Denies presyncope/syncope.  5/13 Low risk myovue   Overall Impression: Normal stress nuclear study. There is a small, mild partially reversible mid anteroseptal perfusion defect that likely represents artifact from the LBBB.   LV Ejection Fraction: 65%. LV Wall Motion: NL LV Function; NL Wall Motion  Echo 08/13/13 reviewed normal EF mild AS Study Conclusions  - Left ventricle: The cavity size was normal. There was moderate concentric hypertrophy. Systolic function was normal. The estimated ejection fraction was in the range of 60% to 65%. Doppler parameters are consistent with abnormal left ventricular relaxation (grade 1 diastolic dysfunction). - Aortic valve: There was mild stenosis. Mean gradient (S): 10 mm Hg. Peak gradient (S): 17 mm Hg. - Mitral valve: There was mild regurgitation. - Atrial septum: No defect or patent foramen ovale was identified.    Duplex from 11/12/13 reviewed stable 60-79% LICA stenosis   Still with some dyspnea on exertion No TIAls compliant with meds   Echo and carotid to be done today   ROS: Denies fever, malais, weight loss, blurry vision, decreased visual acuity, cough, sputum, SOB, hemoptysis, pleuritic pain, palpitaitons, heartburn, abdominal pain, melena, lower extremity edema, claudication, or rash.  All other systems reviewed and  negative  General: Affect appropriate Healthy:  appears stated age HEENT: normal Neck supple with no adenopathy JVP normal right  bruits no thyromegaly Lungs clear with no wheezing and good diaphragmatic motion Heart:  S1/S2 mild AS  murmur, no rub, gallop or click PMI normal Abdomen: benighn, BS positve, no tenderness, no AAA no bruit.  No HSM or HJR Distal pulses intact with no bruits No edema Neuro non-focal Skin warm and dry No muscular weakness   Current Outpatient Prescriptions  Medication Sig Dispense Refill  . aspirin 325 MG tablet Take 325 mg by mouth daily.    [name] escitalopram (LEXAPRO) 10 MG tablet Take 10 mg by mouth daily.    . furosemide (LASIX) 20 MG tablet Take 20 mg by mouth as needed.     [name] losartan (COZAAR) 100 MG tablet Take 100 mg by mouth daily.    . Ranitidine HCl (ZANTAC PO) Take by mouth daily.     No current facility-administered medications for this visit.    Allergies  Codeine  Electrocardiogram:   11/03/12  SR LBBB   10/13/14  SR rate 60 LBBB no change  Assessment and Plan AS:  Mild f/u echo today HTN:  Add norvasc 10 mg Spread pills out cozaar in am lasix in afternoon and norvasc at night  F/U Dr Marina Goodell  Carotid Disease  Duplex today no TIA symptoms right bruit unchanges  Depression  Seems better today  LBBB  Chronic no high grade AV block yearly echo

## 2014-10-12 DIAGNOSIS — Z6832 Body mass index (BMI) 32.0-32.9, adult: Secondary | ICD-10-CM | POA: Diagnosis not present

## 2014-10-12 DIAGNOSIS — I10 Essential (primary) hypertension: Secondary | ICD-10-CM | POA: Diagnosis not present

## 2014-10-13 ENCOUNTER — Ambulatory Visit (INDEPENDENT_AMBULATORY_CARE_PROVIDER_SITE_OTHER): Payer: Medicare Other | Admitting: Cardiovascular Disease

## 2014-10-13 ENCOUNTER — Ambulatory Visit (HOSPITAL_COMMUNITY): Payer: Medicare Other | Attending: Internal Medicine

## 2014-10-13 ENCOUNTER — Other Ambulatory Visit: Payer: Self-pay | Admitting: Cardiovascular Disease

## 2014-10-13 ENCOUNTER — Encounter: Payer: Self-pay | Admitting: Cardiovascular Disease

## 2014-10-13 ENCOUNTER — Other Ambulatory Visit: Payer: Self-pay

## 2014-10-13 ENCOUNTER — Ambulatory Visit (HOSPITAL_BASED_OUTPATIENT_CLINIC_OR_DEPARTMENT_OTHER): Payer: Medicare Other

## 2014-10-13 VITALS — BP 164/98 | HR 60 | Ht 60.0 in | Wt 169.0 lb

## 2014-10-13 DIAGNOSIS — I6523 Occlusion and stenosis of bilateral carotid arteries: Secondary | ICD-10-CM

## 2014-10-13 DIAGNOSIS — I447 Left bundle-branch block, unspecified: Secondary | ICD-10-CM | POA: Diagnosis not present

## 2014-10-13 DIAGNOSIS — R011 Cardiac murmur, unspecified: Secondary | ICD-10-CM | POA: Diagnosis not present

## 2014-10-13 DIAGNOSIS — I10 Essential (primary) hypertension: Secondary | ICD-10-CM | POA: Insufficient documentation

## 2014-10-13 MED ORDER — AMLODIPINE BESYLATE 10 MG PO TABS: 10.0000 mg | ORAL_TABLET | Freq: Every day | ORAL | Status: DC

## 2014-10-13 NOTE — Patient Instructions (Signed)
Medication Instructions:  START  AMLODIPINE   10 MG  EVERY DAY   Labwork: NONE   Testing/Procedures: NONE  Follow-Up: Your physician wants you to follow-up in: 6 MONTHS  WITH  DR Haywood Filler will receive a reminder letter in the mail two months in advance. If you don't receive a letter, please call our office to schedule the follow-up appointment.  Any Other Special Instructions Will Be Listed Below (If Applicable).

## 2014-10-20 DIAGNOSIS — D509 Iron deficiency anemia, unspecified: Secondary | ICD-10-CM | POA: Diagnosis not present

## 2014-10-24 ENCOUNTER — Telehealth: Payer: Self-pay | Admitting: Cardiovascular Disease

## 2014-10-24 NOTE — Telephone Encounter (Signed)
Pt rtn call to [name] re Carotid results-pls call [phone]

## 2014-10-24 NOTE — Telephone Encounter (Signed)
PT  AWARE OF  TEST  RESULTS  ./CY 

## 2014-10-27 DIAGNOSIS — D509 Iron deficiency anemia, unspecified: Secondary | ICD-10-CM | POA: Diagnosis not present

## 2014-12-14 DIAGNOSIS — D509 Iron deficiency anemia, unspecified: Secondary | ICD-10-CM | POA: Diagnosis not present

## 2014-12-15 DIAGNOSIS — D509 Iron deficiency anemia, unspecified: Secondary | ICD-10-CM | POA: Diagnosis not present

## 2014-12-15 DIAGNOSIS — Z6833 Body mass index (BMI) 33.0-33.9, adult: Secondary | ICD-10-CM | POA: Diagnosis not present

## 2014-12-15 DIAGNOSIS — E785 Hyperlipidemia, unspecified: Secondary | ICD-10-CM | POA: Diagnosis not present

## 2014-12-15 DIAGNOSIS — M810 Age-related osteoporosis without current pathological fracture: Secondary | ICD-10-CM | POA: Diagnosis not present

## 2014-12-15 DIAGNOSIS — I10 Essential (primary) hypertension: Secondary | ICD-10-CM | POA: Diagnosis not present

## 2014-12-15 DIAGNOSIS — D508 Other iron deficiency anemias: Secondary | ICD-10-CM | POA: Diagnosis not present

## 2014-12-29 DIAGNOSIS — E785 Hyperlipidemia, unspecified: Secondary | ICD-10-CM | POA: Diagnosis not present

## 2014-12-29 DIAGNOSIS — Z6833 Body mass index (BMI) 33.0-33.9, adult: Secondary | ICD-10-CM | POA: Diagnosis not present

## 2014-12-29 DIAGNOSIS — E559 Vitamin D deficiency, unspecified: Secondary | ICD-10-CM | POA: Diagnosis not present

## 2015-02-02 ENCOUNTER — Encounter: Payer: Self-pay | Admitting: *Deleted

## 2015-02-07 NOTE — Progress Notes (Signed)
Patient ID: [name], female   DOB: December 01, 1940, 74 y.o.   MRN: [id number] 74 y.o.  referred by Dr Marina Goodell in [place] for abnormal ECG. Reviewed his records. ( time alone for this 15 minutes.) Has new LBBB compared to ECG done 2012. She has had a carotid duplex at [facility] for bruit. 60--79% disease Occasional atypical chest pain around left breast. Intermitant , nonexertional. Denies palpitations or syncope. Long standing HTN on RX. Diet poor with continued salt intake. No history of CAD, CHF or arrhythmia. She has poor exercise tolerance and fatigues with exertion easily    5/13 Low risk myovue   Overall Impression: Normal stress nuclear study. There is a small, mild partially reversible mid anteroseptal perfusion defect that likely represents artifact from the LBBB.   Echo 09/2014  LV Ejection Fraction: 65%. LV Wall Motion: NL LV Function; NL Wall Motion  Echo 08/13/13 reviewed normal EF mild AS mean gradient 11 mmHf Study Conclusions  - Left ventricle: The cavity size was normal. There was moderate concentric hypertrophy. Systolic function was normal. The estimated ejection fraction was in the range of 60% to 65%. Doppler parameters are consistent with abnormal left ventricular relaxation (grade 1 diastolic dysfunction). - Aortic valve: There was mild stenosis. Mean gradient (S): 10 mm Hg. Peak gradient (S): 17 mm Hg. - Mitral valve: There was mild regurgitation. - Atrial septum: No defect or patent foramen ovale was identified.   Duplex from 09/2014  reviewed stable 40-59%  LICA stenosis   Still with some dyspnea on exertion No TIAls compliant with meds   Reviewed notes from [facility] Cholesterol 11/2014  LDL 221 TC:  304 HDL 44 Triglycerides 193   Normal LFTls   ROS: Denies fever, malais, weight loss, blurry vision, decreased visual acuity, cough, sputum, SOB, hemoptysis, pleuritic pain, palpitaitons, heartburn, abdominal pain, melena, lower extremity edema,  claudication, or rash.  All other systems reviewed and negative  General: Affect appropriate Healthy:  appears stated age HEENT: normal Neck supple with no adenopathy JVP normal bilateral  bruits no thyromegaly Lungs clear with no wheezing and good diaphragmatic motion Heart:  S1/S2 mild AS  murmur, no rub, gallop or click PMI normal Abdomen: benighn, BS positve, no tenderness, no AAA no bruit.  No HSM or HJR Distal pulses intact with no bruits No edema Neuro non-focal Skin warm and dry No muscular weakness   Current Outpatient Prescriptions  Medication Sig Dispense Refill  . amLODipine (NORVASC) 10 MG tablet Take 1 tablet (10 mg total) by mouth daily. 30 tablet 11  . aspirin 325 MG tablet Take 325 mg by mouth daily.    [name] escitalopram (LEXAPRO) 10 MG tablet Take 10 mg by mouth daily.    . furosemide (LASIX) 20 MG tablet Take 20 mg by mouth daily as needed for fluid or edema.     [name] losartan (COZAAR) 100 MG tablet Take 100 mg by mouth daily.    . Ranitidine HCl (ZANTAC PO) Take 1 tablet by mouth daily.      No current facility-administered medications for this visit.    Allergies  Codeine  Electrocardiogram:   11/03/12  SR LBBB   10/13/14  SR rate 60 LBBB no change  Assessment and Plan AS:  Mild f/u echo July  next year  HTN:  Improved with norvasc low sodium diet   Carotid Disease  40-59% LICA f/u duplex 09/2015  Depression  Seems better today  LBBB  Chronic no high grade AV block  yearly echo  Chol:  Intolerant to zocor, lipitor and crestor severe fatigue and leg pain.  Has not been on anything last 2 months  Refer to lipid clinic for praluent LFTls ok She has carotid disease and target LDL should be 70 or less   Charlton Haws

## 2015-02-08 ENCOUNTER — Encounter: Payer: Self-pay | Admitting: Cardiovascular Disease

## 2015-02-09 ENCOUNTER — Ambulatory Visit (INDEPENDENT_AMBULATORY_CARE_PROVIDER_SITE_OTHER): Payer: Medicare Other | Admitting: Cardiovascular Disease

## 2015-02-09 ENCOUNTER — Encounter: Payer: Self-pay | Admitting: Cardiovascular Disease

## 2015-02-09 VITALS — BP 142/64 | HR 68 | Ht 60.0 in | Wt 167.1 lb

## 2015-02-09 DIAGNOSIS — R011 Cardiac murmur, unspecified: Secondary | ICD-10-CM | POA: Diagnosis not present

## 2015-02-09 DIAGNOSIS — R0989 Other specified symptoms and signs involving the circulatory and respiratory systems: Secondary | ICD-10-CM

## 2015-02-09 DIAGNOSIS — I6523 Occlusion and stenosis of bilateral carotid arteries: Secondary | ICD-10-CM

## 2015-02-09 DIAGNOSIS — I447 Left bundle-branch block, unspecified: Secondary | ICD-10-CM | POA: Diagnosis not present

## 2015-02-09 NOTE — Patient Instructions (Signed)
Medication Instructions:  Your physician recommends that you continue on your current medications as directed. Please refer to the Current Medication list given to you today.   Labwork: NONE  Testing/Procedures: Your physician has requested that you have a carotid duplex. This test is an ultrasound of the carotid arteries in your neck. It looks at blood flow through these arteries that supply the brain with blood. Allow one hour for this exam. There are no restrictions or special instructions. DUE IN  July  Your physician has requested that you have an echocardiogram. Echocardiography is a painless test that uses sound waves to create images of your heart. It provides your doctor with information about the size and shape of your heart and how well your heart's chambers and valves are working. This procedure takes approximately one hour. There are no restrictions for this procedure. DUE IN  July   Follow-Up: Your physician wants you to follow-up in:     [place]   IN LIPID  CLINIC  ASAP  July WITH DR Eden Emms  NEEDS ECHO  AND CAROTID  You will receive a reminder letter in the mail two months in advance. If you don't receive a letter, please call our office to schedule the follow-up appointment.    Any Other Special Instructions Will Be Listed Below (If Applicable).     If you need a refill on your cardiac medications before your next appointment, please call your pharmacy.

## 2015-02-10 ENCOUNTER — Ambulatory Visit (INDEPENDENT_AMBULATORY_CARE_PROVIDER_SITE_OTHER): Payer: Medicare Other | Admitting: Pharmacist

## 2015-02-10 DIAGNOSIS — E785 Hyperlipidemia, unspecified: Secondary | ICD-10-CM

## 2015-02-10 DIAGNOSIS — I6523 Occlusion and stenosis of bilateral carotid arteries: Secondary | ICD-10-CM | POA: Diagnosis not present

## 2015-02-10 MED ORDER — ROSUVASTATIN CALCIUM 5 MG PO TABS: 5.0000 mg | ORAL_TABLET | Freq: Every day | ORAL | Status: DC

## 2015-02-10 NOTE — Patient Instructions (Signed)
Start Crestor 5mg  three days of the week.  If you have any problems, please call Kennon Rounds at [phone].    Call me in a few weeks to let me know how you are doing with the Crestor.

## 2015-02-13 DIAGNOSIS — E782 Mixed hyperlipidemia: Secondary | ICD-10-CM | POA: Insufficient documentation

## 2015-02-13 DIAGNOSIS — E785 Hyperlipidemia, unspecified: Secondary | ICD-10-CM | POA: Insufficient documentation

## 2015-02-13 NOTE — Progress Notes (Signed)
Patient ID: [name], female   DOB: May 14, 1940, 74 y.o.   MRN: [id number]      [name] is a 74 yo F pt of Dr. Eden Emms who was referred to the lipid clinic for statin intolerance.  She has a PMH significant for carotid artery disease and occasional atypical chest pain.   Pt has tried 3 different statins and experienced leg pain, twitching and problems with restless legs when she took them.  Unfortunately we do not have the names and/or doses of these agents because they were prescribed by her PCP.  Dr. Fabio Bering OV note states she tried Crestor, Lipitor and Zocor.   RF: Carotid artery disease, age Goals: LDL < 70 mg/dL, non-HDL < 696 mg/dL Current therapy: none Intolerances: statins (? Which ones and at what dose)  Labs (12/16/14) LDL 221 TC:  304 HDL 44 Triglycerides 193   Normal LFTls  Vitamin D 15.7    Current Outpatient Prescriptions  Medication Sig Dispense Refill  . amLODipine (NORVASC) 10 MG tablet Take 1 tablet (10 mg total) by mouth daily. 30 tablet 11  . aspirin 325 MG tablet Take 325 mg by mouth daily.    [name] escitalopram (LEXAPRO) 10 MG tablet Take 10 mg by mouth daily.    . furosemide (LASIX) 20 MG tablet Take 20 mg by mouth daily as needed for fluid or edema.     [name] losartan (COZAAR) 100 MG tablet Take 100 mg by mouth daily.    . Ranitidine HCl (ZANTAC PO) Take 1 tablet by mouth daily.     . rosuvastatin (CRESTOR) 5 MG tablet Take 1 tablet (5 mg total) by mouth daily. Or as directed 30 tablet 3   No current facility-administered medications for this visit.    Allergies  Codeine  Assessment and Plan 1.  Hyperlipidemia- Pt's cholesterol well above goal.  Unfortunately we do not have the doses of the statins to be able to determine why she may have experienced myalgias.  One thought may be dose related or vit D deficiency.  She was placed on Vit D supplement in September.  We discussed role of low dose statin versus PCSK-9.   Pt is concerned about the cost of PCSK-9  and is willing to retry a statin.  Will start with Crestor 5mg  three days of the week.  She will call in a few weeks to see how she is tolerating.  Will titrate to max tolerated dose.  If still not at goal, consider adding Zetia prior to PCSK-9.  Pt was also given paperwork to see if she qualifies for low-income subsidy support through social security agency.    Pradyun Ishman R

## 2015-02-14 ENCOUNTER — Telehealth (HOSPITAL_COMMUNITY): Payer: Self-pay | Admitting: *Deleted

## 2015-05-06 DIAGNOSIS — H8110 Benign paroxysmal vertigo, unspecified ear: Secondary | ICD-10-CM | POA: Diagnosis not present

## 2015-05-06 DIAGNOSIS — G2581 Restless legs syndrome: Secondary | ICD-10-CM | POA: Diagnosis not present

## 2015-05-06 DIAGNOSIS — Z6832 Body mass index (BMI) 32.0-32.9, adult: Secondary | ICD-10-CM | POA: Diagnosis not present

## 2015-05-11 DIAGNOSIS — E785 Hyperlipidemia, unspecified: Secondary | ICD-10-CM | POA: Diagnosis not present

## 2015-05-11 DIAGNOSIS — E559 Vitamin D deficiency, unspecified: Secondary | ICD-10-CM | POA: Diagnosis not present

## 2015-05-11 DIAGNOSIS — D508 Other iron deficiency anemias: Secondary | ICD-10-CM | POA: Diagnosis not present

## 2015-05-11 DIAGNOSIS — Z6833 Body mass index (BMI) 33.0-33.9, adult: Secondary | ICD-10-CM | POA: Diagnosis not present

## 2015-05-11 DIAGNOSIS — R5383 Other fatigue: Secondary | ICD-10-CM | POA: Diagnosis not present

## 2015-05-11 DIAGNOSIS — I10 Essential (primary) hypertension: Secondary | ICD-10-CM | POA: Diagnosis not present

## 2015-06-05 DIAGNOSIS — G459 Transient cerebral ischemic attack, unspecified: Secondary | ICD-10-CM | POA: Diagnosis not present

## 2015-06-05 DIAGNOSIS — H8110 Benign paroxysmal vertigo, unspecified ear: Secondary | ICD-10-CM | POA: Diagnosis not present

## 2015-06-07 DIAGNOSIS — H401131 Primary open-angle glaucoma, bilateral, mild stage: Secondary | ICD-10-CM | POA: Diagnosis not present

## 2015-06-08 ENCOUNTER — Telehealth: Payer: Self-pay | Admitting: Cardiovascular Disease

## 2015-06-08 NOTE — Telephone Encounter (Signed)
New message   Pt daughter is calling for a same day appt for mother  Offered her Monday with scott same day as nishan and she wants to speak to rn

## 2015-06-08 NOTE — Telephone Encounter (Signed)
CONFUSION IN MESSAGE  SPOKE WITH  DAUGHTER AND  PT   NEEDS AN APPT   .PER  DAUGHTER  RECENTLY SEEN PMD   WITH   B/P OF 180/40 DAUGHTER NOTES  SOME  SLURRED  SPEECH IS  REQUESTING  AN APPT.  APPT  MADE  WITH LORI  GERHARDT  NP TOMORROW AT   1:30 PM .Zack Seal

## 2015-06-08 NOTE — Telephone Encounter (Signed)
SPOKE  WITH  SCHEDULER  TO ADDRESS  MESSAGE PT  NEEDS APPT  SAME DAY AS   MOTHER .Zack Seal

## 2015-06-09 ENCOUNTER — Encounter: Payer: Self-pay | Admitting: Nurse Practitioner

## 2015-06-09 ENCOUNTER — Encounter (HOSPITAL_COMMUNITY): Payer: Self-pay | Admitting: Nurse Practitioner

## 2015-06-09 ENCOUNTER — Observation Stay (HOSPITAL_COMMUNITY): Payer: Medicare Other

## 2015-06-09 ENCOUNTER — Inpatient Hospital Stay (HOSPITAL_COMMUNITY)
Admission: EM | Admit: 2015-06-09 | Discharge: 2015-06-13 | DRG: 065 | Disposition: A | Discharge status: Home Health | Payer: Medicare Other | Attending: Family Medicine | Admitting: Family Medicine

## 2015-06-09 ENCOUNTER — Ambulatory Visit (INDEPENDENT_AMBULATORY_CARE_PROVIDER_SITE_OTHER): Payer: Medicare Other | Admitting: Nurse Practitioner

## 2015-06-09 ENCOUNTER — Emergency Department (HOSPITAL_COMMUNITY): Payer: Medicare Other

## 2015-06-09 VITALS — BP 158/78 | HR 60 | Ht 60.0 in | Wt 168.6 lb

## 2015-06-09 DIAGNOSIS — R911 Solitary pulmonary nodule: Secondary | ICD-10-CM | POA: Diagnosis present

## 2015-06-09 DIAGNOSIS — R4781 Slurred speech: Secondary | ICD-10-CM

## 2015-06-09 DIAGNOSIS — Z8249 Family history of ischemic heart disease and other diseases of the circulatory system: Secondary | ICD-10-CM

## 2015-06-09 DIAGNOSIS — R471 Dysarthria and anarthria: Secondary | ICD-10-CM | POA: Diagnosis present

## 2015-06-09 DIAGNOSIS — Z833 Family history of diabetes mellitus: Secondary | ICD-10-CM

## 2015-06-09 DIAGNOSIS — E782 Mixed hyperlipidemia: Secondary | ICD-10-CM | POA: Diagnosis present

## 2015-06-09 DIAGNOSIS — R29702 NIHSS score 2: Secondary | ICD-10-CM | POA: Diagnosis present

## 2015-06-09 DIAGNOSIS — Z888 Allergy status to other drugs, medicaments and biological substances status: Secondary | ICD-10-CM

## 2015-06-09 DIAGNOSIS — I6340 Cerebral infarction due to embolism of unspecified cerebral artery: Principal | ICD-10-CM | POA: Diagnosis present

## 2015-06-09 DIAGNOSIS — K219 Gastro-esophageal reflux disease without esophagitis: Secondary | ICD-10-CM | POA: Diagnosis present

## 2015-06-09 DIAGNOSIS — I639 Cerebral infarction, unspecified: Secondary | ICD-10-CM | POA: Insufficient documentation

## 2015-06-09 DIAGNOSIS — I447 Left bundle-branch block, unspecified: Secondary | ICD-10-CM | POA: Diagnosis present

## 2015-06-09 DIAGNOSIS — I6330 Cerebral infarction due to thrombosis of unspecified cerebral artery: Secondary | ICD-10-CM

## 2015-06-09 DIAGNOSIS — R296 Repeated falls: Secondary | ICD-10-CM | POA: Diagnosis present

## 2015-06-09 DIAGNOSIS — R42 Dizziness and giddiness: Secondary | ICD-10-CM | POA: Diagnosis not present

## 2015-06-09 DIAGNOSIS — E785 Hyperlipidemia, unspecified: Secondary | ICD-10-CM

## 2015-06-09 DIAGNOSIS — I6322 Cerebral infarction due to unspecified occlusion or stenosis of basilar arteries: Secondary | ICD-10-CM | POA: Diagnosis not present

## 2015-06-09 DIAGNOSIS — G8194 Hemiplegia, unspecified affecting left nondominant side: Secondary | ICD-10-CM | POA: Diagnosis not present

## 2015-06-09 DIAGNOSIS — H409 Unspecified glaucoma: Secondary | ICD-10-CM | POA: Diagnosis present

## 2015-06-09 DIAGNOSIS — Z96649 Presence of unspecified artificial hip joint: Secondary | ICD-10-CM | POA: Diagnosis present

## 2015-06-09 DIAGNOSIS — R531 Weakness: Secondary | ICD-10-CM | POA: Insufficient documentation

## 2015-06-09 DIAGNOSIS — R2990 Unspecified symptoms and signs involving the nervous system: Secondary | ICD-10-CM

## 2015-06-09 DIAGNOSIS — I10 Essential (primary) hypertension: Secondary | ICD-10-CM

## 2015-06-09 DIAGNOSIS — Z7982 Long term (current) use of aspirin: Secondary | ICD-10-CM

## 2015-06-09 DIAGNOSIS — Z886 Allergy status to analgesic agent status: Secondary | ICD-10-CM

## 2015-06-09 DIAGNOSIS — Z79899 Other long term (current) drug therapy: Secondary | ICD-10-CM

## 2015-06-09 LAB — COMPREHENSIVE METABOLIC PANEL
ALT: 14 U/L (ref 14–54)
AST: 19 U/L (ref 15–41)
Albumin: 3.5 g/dL (ref 3.5–5.0)
Alkaline Phosphatase: 86 U/L (ref 38–126)
Anion gap: 8 (ref 5–15)
BUN: 22 mg/dL — ABNORMAL HIGH (ref 6–20)
CHLORIDE: 108 mmol/L (ref 101–111)
CO2: 24 mmol/L (ref 22–32)
CREATININE: 0.91 mg/dL (ref 0.44–1.00)
Calcium: 9.8 mg/dL (ref 8.9–10.3)
Glucose, Bld: 97 mg/dL (ref 65–99)
POTASSIUM: 3.8 mmol/L (ref 3.5–5.1)
Sodium: 140 mmol/L (ref 135–145)
TOTAL PROTEIN: 6.8 g/dL (ref 6.5–8.1)
Total Bilirubin: 0.3 mg/dL (ref 0.3–1.2)

## 2015-06-09 LAB — I-STAT CHEM 8, ED
BUN: 27 mg/dL — ABNORMAL HIGH (ref 6–20)
Calcium, Ion: 1.31 mmol/L — ABNORMAL HIGH (ref 1.13–1.30)
Chloride: 105 mmol/L (ref 101–111)
Creatinine, Ser: 0.9 mg/dL (ref 0.44–1.00)
Glucose, Bld: 91 mg/dL (ref 65–99)
HEMATOCRIT: 43 % (ref 36.0–46.0)
HEMOGLOBIN: 14.6 g/dL (ref 12.0–15.0)
POTASSIUM: 3.8 mmol/L (ref 3.5–5.1)
Sodium: 141 mmol/L (ref 135–145)
TCO2: 24 mmol/L (ref 0–100)

## 2015-06-09 LAB — I-STAT TROPONIN, ED: TROPONIN I, POC: 0.01 ng/mL (ref 0.00–0.08)

## 2015-06-09 LAB — DIFFERENTIAL
BASOS PCT: 0 %
Basophils Absolute: 0 10*3/uL (ref 0.0–0.1)
EOS ABS: 0.1 10*3/uL (ref 0.0–0.7)
EOS PCT: 3 %
Lymphocytes Relative: 35 %
Lymphs Abs: 1.7 10*3/uL (ref 0.7–4.0)
MONO ABS: 0.5 10*3/uL (ref 0.1–1.0)
MONOS PCT: 11 %
Neutro Abs: 2.5 10*3/uL (ref 1.7–7.7)
Neutrophils Relative %: 51 %

## 2015-06-09 LAB — PROTIME-INR
INR: 1.04 (ref 0.00–1.49)
Prothrombin Time: 13.8 seconds (ref 11.6–15.2)

## 2015-06-09 LAB — CBC
HEMATOCRIT: 40.1 % (ref 36.0–46.0)
Hemoglobin: 13 g/dL (ref 12.0–15.0)
MCH: 29.6 pg (ref 26.0–34.0)
MCHC: 32.4 g/dL (ref 30.0–36.0)
MCV: 91.3 fL (ref 78.0–100.0)
Platelets: 294 10*3/uL (ref 150–400)
RBC: 4.39 MIL/uL (ref 3.87–5.11)
RDW: 12.8 % (ref 11.5–15.5)
WBC: 4.9 10*3/uL (ref 4.0–10.5)

## 2015-06-09 LAB — APTT: APTT: 26 s (ref 24–37)

## 2015-06-09 MED ORDER — ASPIRIN EC 325 MG PO TBEC
325.0000 mg | DELAYED_RELEASE_TABLET | Freq: Every day | ORAL | Status: DC
  Administered 2015-06-10 – 2015-06-13 (×4): 325 mg via ORAL
  Filled 2015-06-09 (×4): qty 1

## 2015-06-09 NOTE — ED Notes (Signed)
Family member having concern about pt feeling dizzy and tier at this time, pt states she is feeling like a light HA at this time, Neuro assessment complete no changes noticed neuro wise. MD to be notified.

## 2015-06-09 NOTE — ED Notes (Signed)
Neurology at the bedside

## 2015-06-09 NOTE — ED Notes (Signed)
Pt. And pt.s daughter upset about the long wait times.  Apologized to the family and pt.  They asked if the pt. Could eat , I encouraged them not to. The daughter stated, "She has only had oatmeal and she needs to take her medication."  Pt. And daughter went to the vending machines to get something to eat. Daughter reported that her mother had the symptoms of  slurred speech on Monday but she has been eating every day and has not had any problems.   Pt.'s speech is clear and she  Has no s/s of weakness .

## 2015-06-09 NOTE — Progress Notes (Signed)
CARDIOLOGY OFFICE NOTE  Date:  06/09/2015    [name] Date of Birth: Feb 07, 1941 Medical Record #[id number]  PCP:  [name], MD  Cardiologist:  Eden Emms    Chief Complaint  Patient presents with  . Aphasia  . Hypertension  . Hyperlipidemia    Work in visit - seen for Dr. Eden Emms    History of Present Illness: [name] is a 75 y.o. female who presents today for a work in visit. Seen for Dr. Eden Emms.   She has a history of LBBB, HLD, HTN, carotid disease (40 to 59% LICA from 09/2014 duplex) and depression.  Last seen by the pharmacist & Dr. Eden Emms here back in November.   Phone call yesterday - "CONFUSION IN MESSAGE SPOKE WITH DAUGHTER AND PT NEEDS AN APPT .PER DAUGHTER RECENTLY SEEN PMD WITH B/P OF 180/40 DAUGHTER NOTES SOME SLURRED SPEECH IS REQUESTING AN APPT. APPT MADE WITH [name] TOMORROW AT 1:30 PM ./CY"  Thus added to my schedule.   Comes back today. Here with her daughter and husband today.  Daughter gives most of the history. Has had slurred speech and dragging her left leg since Monday - multiple falls - says she feels "drunk" and dizzy. Daughter worried about "light stroke". [name] refused to go to the hospital earlier this week. She is only on aspirin therapy. Has known carotid disease. No chest pain. Does get short of breath with exertion. No longer on her statin due to "makes me sick". She has been off of her Crestor at least 2 weeks. BP has been running higher over the past week.    Past Medical History  Diagnosis Date  . GERD (gastroesophageal reflux disease)   . Depression   . Anemia   . Hyperlipidemia   . Glaucoma   . HTN (hypertension) 07/12/2011    Past Surgical History  Procedure Laterality Date  . Abdominal hysterectomy    . Oophorectomy    . Cataract surgery    . Back surgery    . Ureter surgery       Medications: Current Outpatient Prescriptions  Medication Sig Dispense Refill  .  amLODipine (NORVASC) 10 MG tablet Take 1 tablet (10 mg total) by mouth daily. 30 tablet 11  . aspirin 325 MG tablet Take 325 mg by mouth daily.    . furosemide (LASIX) 20 MG tablet Take 20 mg by mouth daily as needed for fluid or edema.     [name] latanoprost (XALATAN) 0.005 % ophthalmic solution Place 1 drop into both eyes at bedtime.    [name] losartan (COZAAR) 100 MG tablet Take 100 mg by mouth daily.    . Ranitidine HCl (ZANTAC PO) Take 1 tablet by mouth daily as needed.     [name] escitalopram (LEXAPRO) 10 MG tablet Take 10 mg by mouth daily. Reported on 06/09/2015     No current facility-administered medications for this visit.    Allergies: Allergies  Allergen Reactions  . Codeine     Nausea     Social History: The patient  reports that she has never smoked. She has never used smokeless tobacco. She reports that she does not drink alcohol or use illicit drugs.   Family History: The patient's family history includes Diabetes in her brother; Heart attack in her father; Hypertension in her brother, father, and mother.   Review of Systems: Please see the history of present illness.   Otherwise, the review of systems is positive for none.  All other systems are reviewed and negative.   Physical Exam: VS:  BP 158/78 mmHg  Pulse 60  Ht 5' (1.524 m)  Wt 168 lb 9.6 oz (76.476 kg)  BMI 32.93 kg/m2 .  BMI Body mass index is 32.93 kg/(m^2).  Wt Readings from Last 3 Encounters:  06/09/15 168 lb 9.6 oz (76.476 kg)  02/09/15 167 lb 1.9 oz (75.805 kg)  10/13/14 169 lb (76.658 kg)    General: Pleasant. Elderly female. Looks a little frail. She is alert and in no acute distress. Using a walker.  HEENT: Normal but has obvious mouth droop. Words slightly slurred.  Neck: Supple, no JVD, + bilateral carotid bruits noted.  Cardiac: Regular rate and rhythm. Outflow murmur noted. No edema.  Respiratory:  Lungs are clear to auscultation bilaterally with normal work of breathing.  GI: Soft and nontender.    MS: No deformity or atrophy. Gait is unsteady. Dragging her left leg.  Skin: Warm and dry. Color is normal.  Neuro:  Strength seems reduced on the left. Left grip weaker than the right. She has a mouth droop. Dragging left foot when trying to walk.  Psych: Alert, appropriate and with normal affect.   LABORATORY DATA:  EKG:  EKG is ordered today. This demonstrates NSR with LBBB.  No results found for: WBC, HGB, HCT, PLT, GLUCOSE, CHOL, TRIG, HDL, LDLDIRECT, LDLCALC, ALT, AST, NA, K, CL, CREATININE, BUN, CO2, TSH, PSA, INR, GLUF, HGBA1C, MICROALBUR  BNP (last 3 results) No results for input(s): BNP in the last 8760 hours.  ProBNP (last 3 results) No results for input(s): PROBNP in the last 8760 hours.   Other Studies Reviewed Today:  Echo Study Conclusions from 09/2014  - Left ventricle: The cavity size was normal. Wall thickness was  increased in a pattern of mild LVH. Systolic function was normal.  The estimated ejection fraction was in the range of 60% to 65%.  Doppler parameters are consistent with abnormal left ventricular  relaxation (grade 1 diastolic dysfunction).  Assessment/Plan: 1. Slurred speech/mouth droop and left sided weakness - needs to go to the ER - she would be outside the window for an acute intervention but needs evaluation - will need CT/MRI, carotid dopplers and echo. Needs neurology to see. Needs anticoagulation addressed.   2. HTN  3. HLD  4. Chronic LBBB  Current medicines are reviewed with the patient today.  The patient does not have concerns regarding medicines other than what has been noted above.  The following changes have been made:  See above.  Labs/ tests ordered today include:    Orders Placed This Encounter  Procedures  . EKG 12-Lead     Disposition:   [facility] called - family taking to the ER for further evaluation.   Patient is agreeable to this plan and will call if any problems develop in the interim.   Signed: Rosalio Macadamia, RN, ANP-C 06/09/2015 2:02 PM  [facility] [address] [place], [place]  [address] Phone: [phone] Fax: [phone]

## 2015-06-09 NOTE — Consult Note (Signed)
Neurology Consultation Reason for Consult: Slurred, confused and with left sided weakness Referring Physician: Dr Patria Mane  CC: asa bove  History is obtained from:patient and daughter  HPI: [name] is a 75 y.o. female who per notes: Daughter gives most of the history. Has had slurred speech and dragging her left leg since Monday - multiple falls - says she feels "drunk" and dizzy.  [name] refused to go to the hospital earlier this week. She is only on aspirin therapy and she says she has been taking it.  She has known hyperlipidemia but she cannot take statins because she becomes weak.  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   LKW: last monday tpa given?: no - outside window  ROS:  Unable to obtain due to altered mental status.   Past Medical History  Diagnosis Date  . GERD (gastroesophageal reflux disease)   . Depression   . Anemia   . Hyperlipidemia   . Glaucoma   . HTN (hypertension) 07/12/2011     Family History  Problem Relation Age of Onset  . Hypertension Mother   . Hypertension Father   . Heart attack Father   . Hypertension Brother   . Diabetes Brother     Social History:  reports that she has never smoked. She has never used smokeless tobacco. She reports that she does not drink alcohol or use illicit drugs.  Exam: Current vital signs: BP 193/71 mmHg  Pulse 63  Temp(Src) 98.1 F (36.7 C) (Oral)  Resp 17  SpO2 100% Vital signs in last 24 hours: Temp:  [98.1 F (36.7 C)] 98.1 F (36.7 C) (03/17 1530) Pulse Rate:  [52-107] 63 (03/17 2100) Resp:  [17] 17 (03/17 1530) BP: (143-193)/(54-90) 193/71 mmHg (03/17 2100) SpO2:  [96 %-100 %] 100 % (03/17 2100) Weight:  [76.476 kg (168 lb 9.6 oz)] 76.476 kg (168 lb 9.6 oz) (03/17 1343)   Physical Exam  Constitutional: Appears well-developed and well-nourished.  Psych: Affect appropriate to situation Eyes: No scleral injection HENT: No OP obstrucion Head: Normocephalic.  Cardiovascular:  Normal rate and regular rhythm.  Respiratory: Effort normal and breath sounds normal to anterior ascultation GI: Soft.  No distension. There is no tenderness.  Skin: WDI  Neuro: Mental Status: Patient is awake, alert, oriented to person, place, month, year, and situation Patient is able to give a clear and coherent history. No signs of aphasia or neglect Cranial Nerves: II: Visual Fields are full. Pupils are equal, round, and reactive to light - gaze is conjugate III,IV, VI: EOMI without ptosis or diploplia.  V: Facial sensation is symmetric to temperature VII: Facial movement is symmetric.  VIII: hearing is intact to voice X: Uvula elevates symmetrically XI: Shoulder shrug is symmetric. XII: tongue is midline without atrophy or fasciculations.  Motor: Tone is normal. Bulk is normal. 5/5 strength was present in all four extremities, except for the left leg which is 3+/5 Sensory: Sensation is symmetric to light touch and temperature in the arms and legs. Deep Tendon Reflexes: 1+ and symmetric in the biceps and patellae. Plantars: Toes are down on right silent on left Cerebellar:minimal left dysametria    I have reviewed labs in epic and the results pertinent to this consultation are:  I have reviewed the images obtained:  Head CT and brain MRI  Impression: Stroke - pt has been admitted by hospitalist service for stroke workup.  Stroke team to follow patient tomorrow. I will order STAT CTA given the localization  of her strokes and the possibility for basilar pathology.  Should be given full dose asa now.  OK to use her glaucoma eye drops tonight.  Recommendations: 1) as above

## 2015-06-09 NOTE — ED Provider Notes (Signed)
CSN: [id number]     Arrival date & time 06/09/15  1416 History   First MD Initiated Contact with Patient 06/09/15 1929     Chief Complaint  Patient presents with  . Stroke Symptoms      HPI Patient has a history of hypertension, hyperlipidemia, anemia who presents to the emergency department with 4-5 days of what she describes as "walking like I'm drunk" as well as new left-sided weakness.  Her family reports that her left side does not seem to be working as well as it normally does.  They report that she is weak in her left arm and her left leg.  Family also reports that her speech is been abnormal and slurred over the past 4-5 days.  She did see her primary care physician about this on Monday and was told that this is likely vertigo.  Her symptoms have persisted and thus family sought evaluation in the emergency department today.  She did have a headache earlier in the week but reports no headache at this time.  Her symptoms are mild.  She's had no improvement in her symptoms over the past 48 hours.  Patient denies chest pain.  No prior history of stroke.  She does have hypertension hyperlipidemia.  She is on a daily aspirin.   Past Medical History  Diagnosis Date  . GERD (gastroesophageal reflux disease)   . Depression   . Anemia   . Hyperlipidemia   . Glaucoma   . HTN (hypertension) 07/12/2011   Past Surgical History  Procedure Laterality Date  . Abdominal hysterectomy    . Oophorectomy    . Cataract surgery    . Back surgery    . Ureter surgery    . Total hip arthroplasty     Family History  Problem Relation Age of Onset  . Hypertension Mother   . Hypertension Father   . Heart attack Father   . Hypertension Brother   . Diabetes Brother    Social History  Substance Use Topics  . Smoking status: Never Smoker   . Smokeless tobacco: Never Used  . Alcohol Use: No   OB History    No data available     Review of Systems  All other systems reviewed and are  negative.     Allergies  Codeine and Meclizine  Home Medications   Prior to Admission medications   Medication Sig Start Date End Date Taking? Authorizing Provider  amLODipine (NORVASC) 10 MG tablet Take 1 tablet (10 mg total) by mouth daily. Patient taking differently: Take 10 mg by mouth daily at 6 PM.  10/13/14  Yes Wendall Stade, MD  aspirin 325 MG tablet Take 325 mg by mouth daily.   Yes Historical Provider, MD  furosemide (LASIX) 20 MG tablet Take 20 mg by mouth daily as needed for fluid or edema.    Yes Historical Provider, MD  latanoprost (XALATAN) 0.005 % ophthalmic solution Place 1 drop into both eyes at bedtime.   Yes Historical Provider, MD  losartan (COZAAR) 100 MG tablet Take 100 mg by mouth daily.   Yes Historical Provider, MD  meclizine (ANTIVERT) 25 MG tablet Take 12.5-25 mg by mouth 3 (three) times daily.   Yes Historical Provider, MD  naproxen sodium (ANAPROX) 220 MG tablet Take 220 mg by mouth daily as needed (for pain).   Yes Historical Provider, MD  Ranitidine HCl (ZANTAC PO) Take 150 mg by mouth daily as needed (for acid reflux or heartburn).  Yes Historical Provider, MD   BP 193/71 mmHg  Pulse 63  Temp(Src) 98.1 F (36.7 C) (Oral)  Resp 17  SpO2 100% Physical Exam  Constitutional: She is oriented to person, place, and time. She appears well-developed and well-nourished. No distress.  HENT:  Head: Normocephalic and atraumatic.  Eyes: EOM are normal.  Neck: Normal range of motion.  Cardiovascular: Normal rate, regular rhythm and normal heart sounds.   Pulmonary/Chest: Effort normal and breath sounds normal.  Abdominal: Soft. She exhibits no distension. There is no tenderness.  Musculoskeletal: Normal range of motion.  Neurological: She is alert and oriented to person, place, and time.  Dysmetria with the left upper extremity.  Mild weakness of the left lower extremity as compared to the right.  Normal grip strength bilaterally.  No significant dysarthria  noted on my examination  Skin: Skin is warm and dry.  Psychiatric: She has a normal mood and affect. Judgment normal.  Nursing note and vitals reviewed.   ED Course  Procedures (including critical care time) Labs Review Labs Reviewed  COMPREHENSIVE METABOLIC PANEL - Abnormal; Notable for the following:    BUN 22 (*)    All other components within normal limits  I-STAT CHEM 8, ED - Abnormal; Notable for the following:    BUN 27 (*)    Calcium, Ion 1.31 (*)    All other components within normal limits  PROTIME-INR  APTT  CBC  DIFFERENTIAL  I-STAT TROPOININ, ED  CBG MONITORING, ED    Imaging Review Ct Head Wo Contrast  06/09/2015  CLINICAL DATA:  Difficulty walking, dizziness, hypertension EXAM: CT HEAD WITHOUT CONTRAST TECHNIQUE: Contiguous axial images were obtained from the base of the skull through the vertex without contrast. COMPARISON:  08/31/2009 brain MRI FINDINGS: Diffuse brain atrophy evident with moderately severe chronic white matter microvascular ischemic changes throughout the cerebral hemispheres. No acute intracranial hemorrhage, mass lesion, definite infarction, midline shift, herniation, hydrocephalus, or extra-axial fluid collection. No focal mass effect or edema. Cisterns are patent. Cerebellar atrophy as well. Atherosclerosis of the intracranial vessels. No skull abnormality. Mastoids and sinuses remain clear. IMPRESSION: Atrophy and chronic white matter microvascular changes. No acute process by noncontrast CT. Electronically Signed   By: Judie Petit.  Shick M.D.   On: 06/09/2015 16:07   I have personally reviewed and evaluated these images and lab results as part of my medical decision-making.   EKG Interpretation   Date/Time:  Friday June 09 2015 14:36:31 EDT Ventricular Rate:  61 PR Interval:  184 QRS Duration: 150 QT Interval:  454 QTC Calculation: 457 R Axis:   -27 Text Interpretation:  Normal sinus rhythm Left bundle branch block  Abnormal ECG No significant  change was found Confirmed by Koralee Wedeking  MD,  Caryn Bee ([id number]) on 06/09/2015 8:15:51 PM      MDM   Final diagnoses:  Stroke-like symptoms    Patient symptoms are concerning for stroke.  Head CT is without acute process.  She will undergo MRI imaging at this time.  She'll be admitted the hospital for additional workup.  Admit to the [facility].  Case discussed with Dr. Cena Benton of neurology who will see the patient in the hospital    Azalia Bilis, MD 06/09/15 2134

## 2015-06-09 NOTE — ED Notes (Addendum)
Family c/o 4 day history of slurred speech, L sided weakness, trouble ambulating, c/o dizziness. She went to her PCP and they wanted her to come to ED for evaluation of stroke symptoms. She is alert and denies pain now but did have a headache on Monday when symptoms started. Family states her daughter has been elevated this week.

## 2015-06-09 NOTE — Patient Instructions (Addendum)
Call the [place] office at [phone] if you have any questions, problems or concerns.

## 2015-06-09 NOTE — ED Notes (Signed)
Patient transported to MRI 

## 2015-06-09 NOTE — ED Notes (Signed)
Family states pt began having L sided weakness and slurred speech on Monday. Pt is alert and oriented x4.

## 2015-06-10 ENCOUNTER — Observation Stay (HOSPITAL_COMMUNITY): Payer: Medicare Other

## 2015-06-10 ENCOUNTER — Encounter (HOSPITAL_COMMUNITY): Payer: Self-pay | Admitting: Radiology

## 2015-06-10 ENCOUNTER — Inpatient Hospital Stay (HOSPITAL_COMMUNITY): Payer: Medicare Other

## 2015-06-10 DIAGNOSIS — Z79899 Other long term (current) drug therapy: Secondary | ICD-10-CM | POA: Diagnosis not present

## 2015-06-10 DIAGNOSIS — Z96649 Presence of unspecified artificial hip joint: Secondary | ICD-10-CM | POA: Diagnosis present

## 2015-06-10 DIAGNOSIS — I10 Essential (primary) hypertension: Secondary | ICD-10-CM | POA: Diagnosis not present

## 2015-06-10 DIAGNOSIS — K219 Gastro-esophageal reflux disease without esophagitis: Secondary | ICD-10-CM | POA: Diagnosis present

## 2015-06-10 DIAGNOSIS — R296 Repeated falls: Secondary | ICD-10-CM | POA: Diagnosis present

## 2015-06-10 DIAGNOSIS — Z886 Allergy status to analgesic agent status: Secondary | ICD-10-CM | POA: Diagnosis not present

## 2015-06-10 DIAGNOSIS — Z8249 Family history of ischemic heart disease and other diseases of the circulatory system: Secondary | ICD-10-CM | POA: Diagnosis not present

## 2015-06-10 DIAGNOSIS — I63232 Cerebral infarction due to unspecified occlusion or stenosis of left carotid arteries: Secondary | ICD-10-CM | POA: Diagnosis not present

## 2015-06-10 DIAGNOSIS — I6340 Cerebral infarction due to embolism of unspecified cerebral artery: Secondary | ICD-10-CM | POA: Diagnosis present

## 2015-06-10 DIAGNOSIS — R471 Dysarthria and anarthria: Secondary | ICD-10-CM | POA: Diagnosis present

## 2015-06-10 DIAGNOSIS — Z833 Family history of diabetes mellitus: Secondary | ICD-10-CM | POA: Diagnosis not present

## 2015-06-10 DIAGNOSIS — R918 Other nonspecific abnormal finding of lung field: Secondary | ICD-10-CM | POA: Diagnosis not present

## 2015-06-10 DIAGNOSIS — R911 Solitary pulmonary nodule: Secondary | ICD-10-CM | POA: Diagnosis present

## 2015-06-10 DIAGNOSIS — H409 Unspecified glaucoma: Secondary | ICD-10-CM | POA: Diagnosis present

## 2015-06-10 DIAGNOSIS — I447 Left bundle-branch block, unspecified: Secondary | ICD-10-CM | POA: Diagnosis present

## 2015-06-10 DIAGNOSIS — Z888 Allergy status to other drugs, medicaments and biological substances status: Secondary | ICD-10-CM | POA: Diagnosis not present

## 2015-06-10 DIAGNOSIS — I639 Cerebral infarction, unspecified: Secondary | ICD-10-CM | POA: Diagnosis present

## 2015-06-10 DIAGNOSIS — I6789 Other cerebrovascular disease: Secondary | ICD-10-CM | POA: Diagnosis not present

## 2015-06-10 DIAGNOSIS — I6330 Cerebral infarction due to thrombosis of unspecified cerebral artery: Secondary | ICD-10-CM | POA: Diagnosis not present

## 2015-06-10 DIAGNOSIS — Z7982 Long term (current) use of aspirin: Secondary | ICD-10-CM | POA: Diagnosis not present

## 2015-06-10 DIAGNOSIS — R4781 Slurred speech: Secondary | ICD-10-CM | POA: Diagnosis not present

## 2015-06-10 DIAGNOSIS — I6322 Cerebral infarction due to unspecified occlusion or stenosis of basilar arteries: Secondary | ICD-10-CM | POA: Diagnosis not present

## 2015-06-10 DIAGNOSIS — R29702 NIHSS score 2: Secondary | ICD-10-CM | POA: Diagnosis present

## 2015-06-10 DIAGNOSIS — E785 Hyperlipidemia, unspecified: Secondary | ICD-10-CM | POA: Diagnosis present

## 2015-06-10 DIAGNOSIS — G8194 Hemiplegia, unspecified affecting left nondominant side: Secondary | ICD-10-CM | POA: Diagnosis present

## 2015-06-10 LAB — CBC
HEMATOCRIT: 36.6 % (ref 36.0–46.0)
HEMATOCRIT: 35.9 % — AB (ref 36.0–46.0)
HEMOGLOBIN: 11.8 g/dL — AB (ref 12.0–15.0)
HEMOGLOBIN: 11.7 g/dL — AB (ref 12.0–15.0)
MCH: 29.4 pg (ref 26.0–34.0)
MCH: 29.6 pg (ref 26.0–34.0)
MCHC: 32.2 g/dL (ref 30.0–36.0)
MCHC: 32.6 g/dL (ref 30.0–36.0)
MCV: 91 fL (ref 78.0–100.0)
MCV: 90.9 fL (ref 78.0–100.0)
Platelets: 246 10*3/uL (ref 150–400)
Platelets: 257 10*3/uL (ref 150–400)
RBC: 4.02 MIL/uL (ref 3.87–5.11)
RBC: 3.95 MIL/uL (ref 3.87–5.11)
RDW: 12.9 % (ref 11.5–15.5)
RDW: 13 % (ref 11.5–15.5)
WBC: 5.5 10*3/uL (ref 4.0–10.5)
WBC: 4.3 10*3/uL (ref 4.0–10.5)

## 2015-06-10 LAB — CREATININE, SERUM
CREATININE: 0.97 mg/dL (ref 0.44–1.00)
GFR calc Af Amer: >60 mL/min (ref >60)
GFR, EST NON AFRICAN AMERICAN: 56 mL/min — AB (ref >60)

## 2015-06-10 LAB — LIPID PANEL
Cholesterol: 234 mg/dL — ABNORMAL HIGH (ref 0–200)
HDL: 31 mg/dL — AB (ref >40)
LDL CALC: 155 mg/dL — AB (ref 0–99)
Total CHOL/HDL Ratio: 7.5 RATIO
Triglycerides: 242 mg/dL — ABNORMAL HIGH (ref <150)
VLDL: 48 mg/dL — ABNORMAL HIGH (ref 0–40)

## 2015-06-10 LAB — COMPREHENSIVE METABOLIC PANEL
ALBUMIN: 3.1 g/dL — AB (ref 3.5–5.0)
ALT: 11 U/L — ABNORMAL LOW (ref 14–54)
ANION GAP: 8 (ref 5–15)
AST: 17 U/L (ref 15–41)
Alkaline Phosphatase: 79 U/L (ref 38–126)
BUN: 17 mg/dL (ref 6–20)
CHLORIDE: 110 mmol/L (ref 101–111)
CO2: 23 mmol/L (ref 22–32)
Calcium: 9.3 mg/dL (ref 8.9–10.3)
Creatinine, Ser: 1.23 mg/dL — ABNORMAL HIGH (ref 0.44–1.00)
GFR calc Af Amer: 49 mL/min — ABNORMAL LOW (ref >60)
GFR calc non Af Amer: 42 mL/min — ABNORMAL LOW (ref >60)
GLUCOSE: 109 mg/dL — AB (ref 65–99)
POTASSIUM: 4.2 mmol/L (ref 3.5–5.1)
SODIUM: 141 mmol/L (ref 135–145)
Total Bilirubin: 0.4 mg/dL (ref 0.3–1.2)
Total Protein: 5.9 g/dL — ABNORMAL LOW (ref 6.5–8.1)

## 2015-06-10 MED ORDER — IOHEXOL 350 MG/ML SOLN
50.0000 mL | Freq: Once | INTRAVENOUS | Status: AC | PRN
  Administered 2015-06-10: 50 mL via INTRAVENOUS

## 2015-06-10 MED ORDER — LATANOPROST 0.005 % OP SOLN
1.0000 [drp] | Freq: Every day | OPHTHALMIC | Status: DC
  Filled 2015-06-10 (×3): qty 2.5

## 2015-06-10 MED ORDER — STROKE: EARLY STAGES OF RECOVERY BOOK
Freq: Once | Status: DC
  Filled 2015-06-10: qty 1

## 2015-06-10 MED ORDER — LOSARTAN POTASSIUM 50 MG PO TABS
100.0000 mg | ORAL_TABLET | Freq: Every day | ORAL | Status: DC
  Administered 2015-06-10 – 2015-06-11 (×2): 100 mg via ORAL
  Filled 2015-06-10 (×3): qty 2

## 2015-06-10 MED ORDER — ASPIRIN 300 MG RE SUPP: 300.0000 mg | Freq: Every day | RECTAL | Status: DC

## 2015-06-10 MED ORDER — CLOPIDOGREL BISULFATE 75 MG PO TABS
75.0000 mg | ORAL_TABLET | Freq: Every day | ORAL | Status: DC
  Administered 2015-06-10 – 2015-06-13 (×4): 75 mg via ORAL
  Filled 2015-06-10 (×5): qty 1

## 2015-06-10 MED ORDER — ATORVASTATIN CALCIUM 10 MG PO TABS: 40.0000 mg | ORAL_TABLET | Freq: Every day | ORAL | Status: DC

## 2015-06-10 MED ORDER — AMLODIPINE BESYLATE 10 MG PO TABS
10.0000 mg | ORAL_TABLET | Freq: Every day | ORAL | Status: DC
  Administered 2015-06-10: 10 mg via ORAL
  Filled 2015-06-10: qty 1

## 2015-06-10 MED ORDER — SODIUM CHLORIDE 0.9 % IV SOLN
INTRAVENOUS | Status: AC
  Administered 2015-06-10: 1000 mL via INTRAVENOUS

## 2015-06-10 MED ORDER — NIACIN ER 250 MG PO CPCR
250.0000 mg | ORAL_CAPSULE | Freq: Every day | ORAL | Status: DC
  Administered 2015-06-10 – 2015-06-12 (×3): 250 mg via ORAL
  Filled 2015-06-10 (×4): qty 1

## 2015-06-10 MED ORDER — ASPIRIN 325 MG PO TABS
325.0000 mg | ORAL_TABLET | Freq: Every day | ORAL | Status: DC
  Filled 2015-06-10: qty 1

## 2015-06-10 MED ORDER — ENOXAPARIN SODIUM 40 MG/0.4ML ~~LOC~~ SOLN
40.0000 mg | SUBCUTANEOUS | Status: DC
  Administered 2015-06-10 – 2015-06-13 (×4): 40 mg via SUBCUTANEOUS
  Filled 2015-06-10 (×5): qty 0.4

## 2015-06-10 NOTE — Progress Notes (Signed)
Pt seen and examined, admitted this am by Dr.Kakrakandy 74/F with HTN, LBBB, L CEA stenosis admitted with Slurring of speech, LLE weakness and Dizziness. DIzziness and LLE weakness started about 5month ago and she started slurring on Monday. CVA deficits improving -Passed swallow screen -CTA and MRA with extensive areas of narrowing and absent flow in Vertebral and basilar artery -per Neuro -Neurology consulting -now on ASA and Plavix -LDL 155 but unable to tolerate statin, has tried 4 statins, had severe debilitating myalgias/myopathy  -FU ECHO/Hbaic, PT/OT evals -monitor on tele, no Afib on monitor thus far  Zannie Cove, MD

## 2015-06-10 NOTE — ED Notes (Signed)
Pt eating lunch. Sprite given as requested.

## 2015-06-10 NOTE — Progress Notes (Signed)
Pt arrived to 5M03 @ 1518, Pt A&Ox 4, c/o pain 0/10. Family at the bedside. Diet ordered, will monitor.

## 2015-06-10 NOTE — ED Notes (Signed)
Pt being transported to 5M03 w/telemetry. Family w/pt.

## 2015-06-10 NOTE — ED Notes (Signed)
Pt and daughters aware pt has bed assigned.

## 2015-06-10 NOTE — ED Notes (Signed)
Admitting MD at bedside.

## 2015-06-10 NOTE — ED Notes (Signed)
Patient transported to X-ray 

## 2015-06-10 NOTE — ED Notes (Signed)
Attempted to call report - per secretary, Lebron Conners, RN, advised will call back in approx 15 min.

## 2015-06-10 NOTE — Consult Note (Signed)
CTA shows an area where the basilar artery is not seen and appears to be filling distally - could be retrograde.  However, at this time her NIHSS is 2 (1 dysarthria and 1 mild drift of left leg).  I will ask for more frequent neuro-checks for now until she is assessed by the primary team.  Should she deteriorate, at that point it might be reasonable to consider possible intervention.  It is important to note that her symptoms have been stable since Monday.  I have added plavix.

## 2015-06-10 NOTE — H&P (Signed)
[facility] History and Physical  [name]:[id number] DOB: 26-Mar-1940 DOA: 06/09/2015  Referring physician: Dr. Patria Mane. PCP: [name], MD  Specialists: [facility] cardiology.  Chief Complaint: Slurred speech and dragging left leg.  HPI: [name] is a 75 y.o. female with history of hypertension, chronic LBBB was referred to the ER from cardiology office after patient was found to have slurred speech and dragging her left lower extremity. Patient has been having symptoms of dizziness off and on for last 2-3 weeks. Since Monday 4 days ago her symptoms had gotten worse. MRI of the brain shows multifocal patchy infarcts involving the pons and cerebellum. On-call neurologist Dr. Adline Peals has been consulted and patient has been admitted for further workup. Patient denies any visual symptoms difficulty swallowing. Patient denies any chest pain or shortness of breath.   Review of Systems: As presented in the history of presenting illness, rest negative.  Past Medical History  Diagnosis Date  . GERD (gastroesophageal reflux disease)   . Depression   . Anemia   . Hyperlipidemia   . Glaucoma   . HTN (hypertension) 07/12/2011   Past Surgical History  Procedure Laterality Date  . Abdominal hysterectomy    . Oophorectomy    . Cataract surgery    . Back surgery    . Ureter surgery    . Total hip arthroplasty     Social History:  reports that she has never smoked. She has never used smokeless tobacco. She reports that she does not drink alcohol or use illicit drugs. Where does patient live Home. Can patient participate in ADLs? Yes.  Allergies  Allergen Reactions  . Codeine     Nausea   . Meclizine Other (See Comments)    Lethargy, extreme sleepiness    Family History:  Family History  Problem Relation Age of Onset  . Hypertension Mother   . Hypertension Father   . Heart attack Father   . Hypertension Brother   . Diabetes Brother       Prior to Admission  medications   Medication Sig Start Date End Date Taking? Authorizing Provider  amLODipine (NORVASC) 10 MG tablet Take 1 tablet (10 mg total) by mouth daily. Patient taking differently: Take 10 mg by mouth daily at 6 PM.  10/13/14  Yes Wendall Stade, MD  aspirin 325 MG tablet Take 325 mg by mouth daily.   Yes Historical Provider, MD  furosemide (LASIX) 20 MG tablet Take 20 mg by mouth daily as needed for fluid or edema.    Yes Historical Provider, MD  latanoprost (XALATAN) 0.005 % ophthalmic solution Place 1 drop into both eyes at bedtime.   Yes Historical Provider, MD  losartan (COZAAR) 100 MG tablet Take 100 mg by mouth daily.   Yes Historical Provider, MD  meclizine (ANTIVERT) 25 MG tablet Take 12.5-25 mg by mouth 3 (three) times daily.   Yes Historical Provider, MD  naproxen sodium (ANAPROX) 220 MG tablet Take 220 mg by mouth daily as needed (for pain).   Yes Historical Provider, MD  Ranitidine HCl (ZANTAC PO) Take 150 mg by mouth daily as needed (for acid reflux or heartburn).    Yes Historical Provider, MD    Physical Exam: Filed Vitals:   06/09/15 2245 06/09/15 2300 06/09/15 2315 06/09/15 2330  BP: 156/72 149/68 129/74 113/51  Pulse: 61 35 64 66  Temp:      TempSrc:      Resp:      SpO2: 99% 96%  99% 95%     General:  Moderately built and nourished.  Eyes: Anicteric no pallor.  ENT: No discharge from the ears eyes nose and mouth.  Neck: No mass felt. No neck rigidity.  Cardiovascular: S1 and S2 heard.  Respiratory: No rhonchi or crepitations.  Abdomen: Soft nontender bowel sounds present.  Skin: No rash.  Musculoskeletal: No edema.  Psychiatric: Appears normal.  Neurologic: Alert awake oriented to time place and person. At this time patient is able to move all extremities 5 x 5. No facial asymmetry. Tongue is midline.  Labs on Admission:  Basic Metabolic Panel:  Recent Labs Lab 06/09/15 1449 06/09/15 1547  NA 140 141  K 3.8 3.8  CL 108 105  CO2 24  --    GLUCOSE 97 91  BUN 22* 27*  CREATININE 0.91 0.90  CALCIUM 9.8  --    Liver Function Tests:  Recent Labs Lab 06/09/15 1449  AST 19  ALT 14  ALKPHOS 86  BILITOT 0.3  PROT 6.8  ALBUMIN 3.5   No results for input(s): LIPASE, AMYLASE in the last 168 hours. No results for input(s): AMMONIA in the last 168 hours. CBC:  Recent Labs Lab 06/09/15 1449 06/09/15 1547  WBC 4.9  --   NEUTROABS 2.5  --   HGB 13.0 14.6  HCT 40.1 43.0  MCV 91.3  --   PLT 294  --    Cardiac Enzymes: No results for input(s): CKTOTAL, CKMB, CKMBINDEX, TROPONINI in the last 168 hours.  BNP (last 3 results) No results for input(s): BNP in the last 8760 hours.  ProBNP (last 3 results) No results for input(s): PROBNP in the last 8760 hours.  CBG: No results for input(s): GLUCAP in the last 168 hours.  Radiological Exams on Admission: Ct Head Wo Contrast  06/09/2015  CLINICAL DATA:  Difficulty walking, dizziness, hypertension EXAM: CT HEAD WITHOUT CONTRAST TECHNIQUE: Contiguous axial images were obtained from the base of the skull through the vertex without contrast. COMPARISON:  08/31/2009 brain MRI FINDINGS: Diffuse brain atrophy evident with moderately severe chronic white matter microvascular ischemic changes throughout the cerebral hemispheres. No acute intracranial hemorrhage, mass lesion, definite infarction, midline shift, herniation, hydrocephalus, or extra-axial fluid collection. No focal mass effect or edema. Cisterns are patent. Cerebellar atrophy as well. Atherosclerosis of the intracranial vessels. No skull abnormality. Mastoids and sinuses remain clear. IMPRESSION: Atrophy and chronic white matter microvascular changes. No acute process by noncontrast CT. Electronically Signed   By: Judie Petit.  Shick M.D.   On: 06/09/2015 16:07   Mr Brain Wo Contrast  06/09/2015  CLINICAL DATA:  Initial evaluation for slurred speech, left-sided weakness. EXAM: MRI HEAD WITHOUT CONTRAST TECHNIQUE: Multiplanar,  multiecho pulse sequences of the brain and surrounding structures were obtained without intravenous contrast. COMPARISON:  Prior CT from earlier the same day as well as previous MRI from 08/31/2009. FINDINGS: Diffuse prominence of the CSF containing spaces compatible with generalized cerebral atrophy. Patchy T2/FLAIR hyperintensity within the periventricular and deep white matter both cerebral hemispheres most consistent with chronic small vessel ischemic disease, moderate nature. Small vessel type changes present within the pons as well. Scatter remote lacunar infarcts within the bilateral basal ganglia, left thalamus, pons, and left cerebellar hemisphere. There are multi focal patchy acute ischemic infarcts involving the pons, right greater than left, with additional patchy infarcts extending into the middle cerebellar hemispheres and left cerebellar hemisphere. For reference purposes, largest infarct within the pons present within the right aspect of the pons and  measures approximately 11 mm. Largest infarct within the left cerebellar hemisphere measures approximately 15 mm. No significant mass effect. No associated hemorrhage. Abnormal flow voids present within the vertebrobasilar system, worrisome for heavy atheromatous disease and/ or slow flow or even possibly occlusion cleared normal vascular flow voids present within the anterior circulation. Gray-white matter differentiation otherwise maintained. No supratentorial infarcts. No mass lesion, midline shift, or mass effect. No hydrocephalus. No extra-axial fluid collection. Craniocervical junction normal. Mild multilevel degenerative spondylolysis present within the upper cervical spine without significant stenosis. Pituitary gland within normal limits. No acute abnormality about the orbits. Sequela prior bilateral lens extraction noted. Paranasal sinuses are clear. No mastoid effusion. Inner ear structures within normal limits. Bone marrow signal intensity  normal. No scalp soft tissue abnormality. IMPRESSION: 1. Multi focal patchy acute ischemic infarcts involving the pons, bilateral middle cerebellar peduncle is, and left cerebellar hemisphere. No associated hemorrhage or mass effect. 2. Abnormal flow voids within the vertebrobasilar system, worrisome for heavy atheromatous disease, slow flow, or possibly even occlusion. Further evaluation with dedicated CTA recommended. 3. Generalized cerebral atrophy with moderate chronic small vessel ischemic disease. Results were called by telephone at the time of interpretation on 06/09/2015 at 11:00 pm to Dr. Azalia Bilis , who verbally acknowledged these results. Electronically Signed   By: Rise Mu M.D.   On: 06/09/2015 23:01    EKG: Independently reviewed. Sinus with LBBB.  Assessment/Plan Principal Problem:   Cerebral thrombosis with cerebral infarction Active Problems:   HTN (hypertension)   Hyperlipidemia   Stroke (cerebrum) (HCC)   1. Acute stroke involving the cerebellum and pons - appreciate neurology consult. At this time CT angiogram of the head and neck has been ordered. Check 2-D echo. Aspirin. Gentle hydration. Physical therapy consult. Check hemoglobin A1c and lipid panel. 2. Hypertension - allow for permissive hypertension. Will hold Lasix continue ARB and amlodipine. 3. Hyperlipidemia intolerant to statins. 4. Chronic LBBB.   DVT Prophylaxis Lovenox.  Code Status: Full code.  Family Communication: Discussed with patient's daughter.  Disposition Plan: Admit to inpatient.    Corianne Buccellato N. [facility] Pager [phone]  If 7PM-7AM, please contact night-coverage [email or web address] Password [email or web address] 06/10/2015, 1:08 AM

## 2015-06-10 NOTE — ED Notes (Addendum)
MD in w pt

## 2015-06-10 NOTE — ED Notes (Signed)
Patient transported to CT 

## 2015-06-10 NOTE — Progress Notes (Addendum)
STROKE TEAM PROGRESS NOTE   HISTORY OF PRESENT ILLNESS [name] is a 75 y.o. female who per notes: Daughter gives most of the history. Has had slurred speech and dragging her left leg since Monday - multiple falls - says she feels "drunk" and dizzy. [name] refused to go to the hospital earlier this week. She is only on aspirin therapy and she says she has been taking it. She has known hyperlipidemia but she cannot take statins because she becomes weak. 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   LKW: last monday tpa given?: no - outside window   SUBJECTIVE (INTERVAL HISTORY) Feels better. Getting stronger. Can't take statins due to weakness, myalgias. She was just on a cholesterol medication and had a $1500 shot and she can't afford it.    OBJECTIVE Temp:  [98.1 F (36.7 C)] 98.1 F (36.7 C) (03/17 1530) Pulse Rate:  [35-107] 72 (03/18 0812) Cardiac Rhythm:  [-]  Resp:  [12-20] 15 (03/18 0812) BP: (113-193)/(49-90) 154/72 mmHg (03/18 0812) SpO2:  [93 %-100 %] 95 % (03/18 0812) Weight:  [76.476 kg (168 lb 9.6 oz)] 76.476 kg (168 lb 9.6 oz) (03/17 1343)  CBC:  Recent Labs Lab 06/09/15 1449 06/09/15 1547 06/10/15 0351  WBC 4.9  --  5.5  NEUTROABS 2.5  --   --   HGB 13.0 14.6 11.8*  HCT 40.1 43.0 36.6  MCV 91.3  --  91.0  PLT 294  --  246    Basic Metabolic Panel:  Recent Labs Lab 06/09/15 1449 06/09/15 1547 06/10/15 0351  NA 140 141  --   K 3.8 3.8  --   CL 108 105  --   CO2 24  --   --   GLUCOSE 97 91  --   BUN 22* 27*  --   CREATININE 0.91 0.90 0.97  CALCIUM 9.8  --   --     Lipid Panel:    Component Value Date/Time   CHOL 234* 06/09/2015 2359   TRIG 242* 06/09/2015 2359   HDL 31* 06/09/2015 2359   CHOLHDL 7.5 06/09/2015 2359   VLDL 48* 06/09/2015 2359   LDLCALC 155* 06/09/2015 2359   HgbA1c: No results found for: HGBA1C Urine Drug Screen: No results found for: LABOPIA, COCAINSCRNUR, LABBENZ, AMPHETMU, THCU, LABBARB     IMAGING   CTA NECK   06/10/2015 1. 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  2. 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  3. Atheromatous narrowing of approximately 50% at the left carotid bifurcation/proximal left ICA. No significant stenosis within the right ICA or common carotid arteries.  4. 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  5. Moderate to severe stenosis at the origin of the dominant right vertebral artery. Vertebral arteries otherwise patent within the neck.  6. 6 mm right upper lobe nodule, indeterminate. Non-contrast chest CT at 3-6 months is recommended. If the nodules are stable at time of repeat CT, then future CT at 18-24 months (from today's scan) is considered optional for low-risk patients, but is recommended for high-risk patients. This recommendation follows the consensus statement: Guidelines for Management of Incidental Pulmonary Nodules Detected on CT Images:From the Fleischner Society 2017; published online before print (10.1148/[id number]).    CTA HEAD   06/10/2015 1. 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 There  is some thready attenuated distal reconstitution, possibly via retrograde flow from the anterior circulation. Superior cerebral arteries attenuated but patent proximally. PCAs patent and supplied mainly via the posterior communicating arteries. PCAs demonstrate multifocal atheromatous irregularity with stenoses bilaterally.  2. Extensive atheromatous disease with severe stenoses involving the dominant right V4 segment, which is nearly occluded prior to the vertebrobasilar junction.   Diminutive left vertebral artery becomes occluded as it courses into the cranial vault.  3. Extensive atheromatous plaque throughout the  cavernous/supraclinoid ICAs.   There is moderate to severe narrowing of the supraclinoid ICAs bilaterally.  4. Multi focal atheromatous irregularity with narrowing within the anterior cerebral arteries bilaterally, right worse than left.  5. Short-segment mild to moderate stenosis within the proximal petrous left ICA.     Dg Chest 2 View 06/10/2015   1. Lungs hypoexpanded. Mild bibasilar airspace opacities may reflect atelectasis or possibly mild pneumonia.  2. Mild cardiomegaly.  3. Moderate to large hiatal hernia, increased in size from 2007.     Ct Head Wo Contrast 06/09/2015   Atrophy and chronic white matter microvascular changes. No acute process by noncontrast CT.     Mr Brain Wo Contrast 06/09/2015   1. Multi focal patchy acute ischemic infarcts involving the pons, bilateral middle cerebellar peduncle is, and left cerebellar hemisphere. No associated hemorrhage or mass effect.  2. Abnormal flow voids within the vertebrobasilar system, worrisome for heavy atheromatous disease, slow flow, or possibly even occlusion. Further evaluation with dedicated CTA recommended. (CTA 06/10/15) - see above 3. Generalized cerebral atrophy with moderate chronic small vessel ischemic disease.    Mr [name] Head/brain Wo Cm 06/10/2015   1. Absent flow within the vertebral arteries and basilar artery, better evaluated on prior CTA of the head and neck performed earlier the same day. Superior cerebral arteries not visualized on this exam. Bilateral PCAs supplied via the posterior communicating arteries.  2. Multi focal atheromatous irregularity throughout the cavernous/supraclinoid ICAs with secondary moderate to severe multi focal narrowing, greatest at the supraclinoid segments.  3. Focal mild to moderate focal stenosis within the proximal petrous left ICA.  4. Distal atheromatous irregularity within knee ACAs and MCAs bilaterally.     PHYSICAL EXAM  Physical exam: Exam: Gen: NAD Eyes: anicteric  sclerae, moist conjunctivae                    CV: no RG, +SEM no carotid bruits, no peripheral edema Mental Status: Alert, follows commands, good historian, oriented to name, date, location  Neuro: Detailed Neurologic Exam  Speech:    No aphasia, no dysarthria, can repeat and name without difficulty  Cranial Nerves:    The pupils are equal, round, and reactive to light.. Attempted, Fundi not visualized.  EOMI. Conjugate, midline gaze.  Visual fields full. Face symmetric, Tongue midline. Hearing intact to voice. Shoulder shrug intact. Uvula elevates symmetrically.   Motor Observation:    no involuntary movements noted. Tone appears normal.     Strength:    Appears intact with some mild left leg drift     Sensation:  Intact to LT  Plantars downgoing.   Coordination: No dysmetria  DTRs: Absent Ajs otherwise 1+     ASSESSMENT/PLAN Ms. ALLENA SHRODE is a 75 y.o. female with history of hyperlipidemia, hypertension, and chronic left bundle branch block, presenting with slurred speech, left lower extremity weakness, dizziness, and multiple falls.. She did not receive IV t-PA due to late presentation.  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  MRA  diffuse cerebrovascular disease as noted above.  Carotid Doppler  refer to CTA of the neck  2D Echo  pending  LDL 155, reports stain intolerance  HgbA1c pending  VTE prophylaxis - Lovenox  Diet Heart Room service appropriate?: Yes; Fluid consistency:: Thin  aspirin 325 mg daily prior to admission, now on aspirin 325 mg daily and clopidogrel 75 mg daily  Patient counseled to be compliant with her antithrombotic medications  Ongoing aggressive stroke  risk factor management  Therapy recommendations:  Pending  Disposition:  Pending  Hypertension  Stable  Permissive hypertension (OK if < 220/120) but gradually normalize in 5-7 days. Eventual BP goal 130-150 due to right ICA high grade stenosis  Hyperlipidemia  Home meds: No lipid lowering medications prior to admission.  LDL 155, goal < 70  The patient is intolerant to statins  Will try Niacin 250 mg QHS - give ASA 325 mg 30 minutes before niacin to prevent flushing.    Other Stroke Risk Factors  Advanced age     Other Active Problems  Pulmonary nodule - F/U in 3 - 6 months.   Hospital day # 0   Personally examined patient and images, and have participated in and made any corrections needed to history, physical, neuro exam,assessment and plan as stated above.  I have personally obtained the history, evaluated lab date, reviewed imaging studies and agree with radiology interpretations.    Naomie Dean, MD Stroke Neurology [phone] [facility]      To contact Stroke Continuity provider, please refer to [email or web address]. After hours, contact General Neurology

## 2015-06-11 ENCOUNTER — Inpatient Hospital Stay (HOSPITAL_COMMUNITY): Payer: Medicare Other

## 2015-06-11 ENCOUNTER — Encounter (HOSPITAL_COMMUNITY): Payer: Self-pay | Admitting: General Practice

## 2015-06-11 DIAGNOSIS — E785 Hyperlipidemia, unspecified: Secondary | ICD-10-CM

## 2015-06-11 DIAGNOSIS — I639 Cerebral infarction, unspecified: Secondary | ICD-10-CM | POA: Insufficient documentation

## 2015-06-11 DIAGNOSIS — I6789 Other cerebrovascular disease: Secondary | ICD-10-CM

## 2015-06-11 LAB — ECHOCARDIOGRAM COMPLETE
HEIGHTINCHES: 60 in
Weight: 2667.81 oz

## 2015-06-11 LAB — CBC
HCT: 38.3 % (ref 36.0–46.0)
Hemoglobin: 12.7 g/dL (ref 12.0–15.0)
MCH: 30.5 pg (ref 26.0–34.0)
MCHC: 33.2 g/dL (ref 30.0–36.0)
MCV: 91.8 fL (ref 78.0–100.0)
PLATELETS: 309 10*3/uL (ref 150–400)
RBC: 4.17 MIL/uL (ref 3.87–5.11)
RDW: 13 % (ref 11.5–15.5)
WBC: 4.3 10*3/uL (ref 4.0–10.5)

## 2015-06-11 LAB — BASIC METABOLIC PANEL
Anion gap: 6 (ref 5–15)
BUN: 16 mg/dL (ref 6–20)
CO2: 25 mmol/L (ref 22–32)
CREATININE: 1.2 mg/dL — AB (ref 0.44–1.00)
Calcium: 9.7 mg/dL (ref 8.9–10.3)
Chloride: 109 mmol/L (ref 101–111)
GFR calc Af Amer: 50 mL/min — ABNORMAL LOW (ref >60)
GFR, EST NON AFRICAN AMERICAN: 43 mL/min — AB (ref >60)
GLUCOSE: 119 mg/dL — AB (ref 65–99)
POTASSIUM: 4 mmol/L (ref 3.5–5.1)
Sodium: 140 mmol/L (ref 135–145)

## 2015-06-11 NOTE — Evaluation (Signed)
Physical Therapy Evaluation Patient Details Name: [name] MRN: [id number] DOB: 07-Jan-1941 Today's Date: 06/11/2015   History of Present Illness  Pt with onset of symptoms 3/13 including difficulty with walking.  She did not seek medical attention until ~1 week later, determined she sustained bil brainstem CVAs.  Clinical Impression  Pt highly motivated.  She has very supportive family.  She denies dizziness, nor change in vision.  Will continue to follow patient while on this venue of care to progress mobility.     Follow Up Recommendations Home health PT;Supervision for mobility/OOB    Equipment Recommendations  None recommended by PT    Recommendations for Other Services       Precautions / Restrictions Precautions Precautions: Fall Restrictions Weight Bearing Restrictions: No      Mobility  Bed Mobility Overal bed mobility: Independent             General bed mobility comments: supine to sit with raised HOB  Transfers Overall transfer level: Modified independent Equipment used: Rolling walker (2 wheeled)             General transfer comment: toilet transfer close supervision  Ambulation/Gait Ambulation/Gait assistance: Supervision Ambulation Distance (Feet): 450 Feet Assistive device: Rolling walker (2 wheeled) Gait Pattern/deviations: WFL(Within Functional Limits)     General Gait Details: slow cadence  Stairs            Wheelchair Mobility    Modified Rankin (Stroke Patients Only) Modified Rankin (Stroke Patients Only) Pre-Morbid Rankin Score: No symptoms Modified Rankin: No significant disability     Balance Overall balance assessment: Needs assistance Sitting-balance support: No upper extremity supported;Feet supported Sitting balance-Leahy Scale: Normal Sitting balance - Comments: performed toileting hygiene without difficulty   Standing balance support: No upper extremity supported Standing balance-Leahy Scale:  Fair Standing balance comment: washed hands at sink with good safety                             Pertinent Vitals/Pain Pain Assessment: No/denies pain    Home Living Family/patient expects to be discharged to:: Private residence Living Arrangements: Spouse/significant other Available Help at Discharge: Family;Available 24 hours/day Type of Home: House Home Access: Ramped entrance     Home Layout: One level Home Equipment: Walker - 2 wheels;Bedside commode;Cane - single point Additional Comments: home accessible from when her sister lived with them.  She has some medical equipment from old hip surgery.    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 Can sit or stand to work     Hand Dominance   Dominant Hand: Right    Extremity/Trunk Assessment   Upper Extremity Assessment: Overall WFL for tasks assessed (4+/5 bil shoulder flexion, abduction, elbow flexion/ext)           Lower Extremity Assessment: Overall WFL for tasks assessed (5/5 knee ext, bil ankle DF)      Cervical / Trunk Assessment: Normal  Communication   Communication: No difficulties  Cognition Arousal/Alertness: Awake/alert Behavior During Therapy: WFL for tasks assessed/performed Overall Cognitive Status: Within Functional Limits for tasks assessed                      General Comments General comments (skin integrity, edema, etc.): post gait HR 95 bpm, 97% oxygen saturation on  RA    Exercises        Assessment/Plan    PT Assessment Patient needs continued PT services  PT Diagnosis Difficulty walking   PT Problem List Decreased activity tolerance;Decreased balance;Decreased mobility  PT Treatment Interventions DME instruction;Gait training;Functional mobility training;Therapeutic activities;Therapeutic exercise;Balance training;Neuromuscular  re-education;Patient/family education   PT Goals (Current goals can be found in the Care Plan section) Acute Rehab PT Goals Patient Stated Goal: be able to return to work PT Goal Formulation: With patient/family Time For Goal Achievement: 06/24/15 Potential to Achieve Goals: Good    Frequency Min 3X/week   Barriers to discharge        Co-evaluation               End of Session   Activity Tolerance: Patient tolerated treatment well Patient left: in chair;with call bell/phone within reach;with family/visitor present           Time: 5366-4403 PT Time Calculation (min) (ACUTE ONLY): 20 min   Charges:   PT Evaluation $PT Eval Low Complexity: 1 Procedure     PT G CodesNestor Yates, PT [phone]  [name] 06/11/2015, 1:57 PM

## 2015-06-11 NOTE — Progress Notes (Signed)
STROKE TEAM PROGRESS NOTE   HISTORY OF PRESENT ILLNESS [name] is a 75 y.o. female who per notes: Daughter gives most of the history. Has had slurred speech and dragging her left leg since Monday - multiple falls - says she feels "drunk" and dizzy. [name] refused to go to the hospital earlier this week. She is only on aspirin therapy and she says she has been taking it. She has known hyperlipidemia but she cannot take statins because she becomes weak. 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   LKW: last monday tpa given?: no - outside window   SUBJECTIVE (INTERVAL HISTORY) Feels better. Getting stronger. Can't take statins due to weakness, myalgias. She was just on a cholesterol medication and had a $1500 shot and she can't afford it.    OBJECTIVE Temp:  [98.2 F (36.8 C)-98.6 F (37 C)] 98.6 F (37 C) (03/19 1658) Pulse Rate:  [60-75] 75 (03/19 1658) Cardiac Rhythm:  [-] Normal sinus rhythm (03/19 0910) Resp:  [16-20] 18 (03/19 1658) BP: (128-189)/(57-87) 189/87 mmHg (03/19 1658) SpO2:  [96 %-99 %] 96 % (03/19 1658) Weight:  [75.632 kg (166 lb 11.8 oz)] 75.632 kg (166 lb 11.8 oz) (03/19 0300)  CBC:  Recent Labs Lab 06/09/15 1449  06/10/15 1535 06/11/15 1405  WBC 4.9  < > 4.3 4.3  NEUTROABS 2.5  --   --   --   HGB 13.0  < > 11.7* 12.7  HCT 40.1  < > 35.9* 38.3  MCV 91.3  < > 90.9 91.8  PLT 294  < > 257 309  < > = values in this interval not displayed.  Basic Metabolic Panel:   Recent Labs Lab 06/10/15 1535 06/11/15 1405  NA 141 140  K 4.2 4.0  CL 110 109  CO2 23 25  GLUCOSE 109* 119*  BUN 17 16  CREATININE 1.23* 1.20*  CALCIUM 9.3 9.7    Lipid Panel:     Component Value Date/Time   CHOL 234* 06/09/2015 2359   TRIG 242* 06/09/2015 2359   HDL 31* 06/09/2015 2359   CHOLHDL 7.5 06/09/2015 2359   VLDL 48* 06/09/2015 2359   LDLCALC 155* 06/09/2015 2359   HgbA1c: No results found for: HGBA1C Urine Drug Screen: No results found for:  LABOPIA, COCAINSCRNUR, LABBENZ, AMPHETMU, THCU, LABBARB    IMAGING   CTA NECK   06/10/2015 1. 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  2. 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  3. Atheromatous narrowing of approximately 50% at the left carotid bifurcation/proximal left ICA. No significant stenosis within the right ICA or common carotid arteries.  4. 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  5. Moderate to severe stenosis at the origin of the dominant right vertebral artery. Vertebral arteries otherwise patent within the neck.  6. 6 mm right upper lobe nodule, indeterminate. Non-contrast chest CT at 3-6 months is recommended. If the nodules are stable at time of repeat CT, then future CT at 18-24 months (from today's scan) is considered optional for low-risk patients, but is recommended for high-risk patients. This recommendation follows the consensus statement: Guidelines for Management of Incidental Pulmonary Nodules Detected on CT Images:From the Fleischner Society 2017; published online before print (10.1148/radiol.[id number]).    CTA HEAD   06/10/2015 1. Occlusion of the proximal/mid basilar artery as  above. There is some thready attenuated distal reconstitution, possibly via retrograde flow from the anterior circulation. Superior cerebral arteries attenuated but patent proximally. PCAs patent and supplied mainly via the posterior communicating arteries. PCAs demonstrate multifocal atheromatous irregularity with stenoses bilaterally.  2. Extensive atheromatous disease with severe stenoses involving the dominant right V4 segment, which is nearly occluded prior to the vertebrobasilar junction.   Diminutive left vertebral artery becomes occluded as it courses into the cranial vault.   3. Extensive atheromatous plaque throughout the cavernous/supraclinoid ICAs.   There is moderate to severe narrowing of the supraclinoid ICAs bilaterally.  4. Multi focal atheromatous irregularity with narrowing within the anterior cerebral arteries bilaterally, right worse than left.  5. Short-segment mild to moderate stenosis within the proximal petrous left ICA.     Dg Chest 2 View 06/10/2015   1. Lungs hypoexpanded. Mild bibasilar airspace opacities may reflect atelectasis or possibly mild pneumonia.  2. Mild cardiomegaly.  3. Moderate to large hiatal hernia, increased in size from 2007.     Ct Head Wo Contrast 06/09/2015   Atrophy and chronic white matter microvascular changes. No acute process by noncontrast CT.     Mr Brain Wo Contrast 06/09/2015   1. Multi focal patchy acute ischemic infarcts involving the pons, bilateral middle cerebellar peduncle is, and left cerebellar hemisphere. No associated hemorrhage or mass effect.  2. Abnormal flow voids within the vertebrobasilar system, worrisome for heavy atheromatous disease, slow flow, or possibly even occlusion. Further evaluation with dedicated CTA recommended. (CTA 06/10/15) - see above 3. Generalized cerebral atrophy with moderate chronic small vessel ischemic disease.    Mr Maxine Glenn Head/brain Wo Cm 06/10/2015   1. Absent flow within the vertebral arteries and basilar artery, better evaluated on prior CTA of the head and neck performed earlier the same day. Superior cerebral arteries not visualized on this exam. Bilateral PCAs supplied via the posterior communicating arteries.  2. Multi focal atheromatous irregularity throughout the cavernous/supraclinoid ICAs with secondary moderate to severe multi focal narrowing, greatest at the supraclinoid segments.  3. Focal mild to moderate focal stenosis within the proximal petrous left ICA.  4. Distal atheromatous irregularity within knee ACAs and MCAs bilaterally.     PHYSICAL  EXAM  Physical exam: Exam: Gen: NAD Eyes: anicteric sclerae, moist conjunctivae                    CV: no RG, +SEM no carotid bruits, no peripheral edema Mental Status: Alert, follows commands, good historian, oriented to name, date, location  Neuro: Detailed Neurologic Exam  Speech:    No aphasia, no dysarthria, can repeat and name without difficulty  Cranial Nerves:    The pupils are equal, round, and reactive to light.. Attempted, Fundi not visualized.  EOMI. Conjugate, midline gaze.  Visual fields full. Face symmetric, Tongue midline. Hearing intact to voice. Shoulder shrug intact. Uvula elevates symmetrically.   Motor Observation:    no involuntary movements noted. Tone appears normal.     Strength:    Appears intact with some mild left leg drift     Sensation:  Intact to LT  Plantars downgoing.   Coordination: No dysmetria  DTRs: Absent Ajs otherwise 1+     ASSESSMENT/PLAN [name] is a 75 y.o. female with history of hyperlipidemia, hypertension, and chronic left bundle branch block, presenting with slurred speech, left lower extremity weakness, dizziness, and multiple falls.. She did not receive IV t-PA due to late presentation.  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  MRA  diffuse cerebrovascular disease as noted above.  Carotid Doppler  refer to CTA of the neck  2D Echo EF 65-70%. No cardiac source of emboli identified.  LDL 155, reports stain intolerance  HgbA1c pending  VTE prophylaxis - Lovenox Diet Heart Room service appropriate?: Yes; Fluid consistency:: Thin  aspirin 325 mg daily prior to admission, now on aspirin 325 mg daily and clopidogrel 75 mg daily for 3  months and then Plavix alone.  Patient counseled to be compliant with her antithrombotic medications  Ongoing aggressive stroke risk factor management  Therapy recommendations:  Pending  Disposition:  Pending  Hypertension  Stable  Permissive hypertension (OK if < 220/120) but gradually normalize in 5-7 days. Eventual BP goal 130-150 due to right ICA high grade stenosis  Hyperlipidemia  Home meds: No lipid lowering medications prior to admission.  LDL 155, goal < 70  The patient is intolerant to statins  Will try Niacin 250 mg QHS - give ASA 325 mg 30 minutes before niacin to prevent flushing.  Other Stroke Risk Factors  Advanced age   Other Active Problems  Pulmonary nodule - F/U in 3 - 6 months.   Severe cerebrovascular disease as documented on CT angiogram  Hospital day # 1   Personally examined patient and images, and have participated in and made any corrections needed to history, physical, neuro exam,assessment and plan as stated above.  I have personally obtained the history, evaluated lab date, reviewed imaging studies and agree with radiology interpretations.    Diane Dean, MD Stroke Neurology [phone] [facility]      To contact Stroke Continuity provider, please refer to [email or web address]. After hours, contact General Neurology

## 2015-06-11 NOTE — Progress Notes (Signed)
  Echocardiogram 2D Echocardiogram has been performed.  Janalyn Harder 06/11/2015, 10:58 AM

## 2015-06-11 NOTE — Progress Notes (Signed)
[facility] PROGRESS NOTE  [name] PPJ:[id number] DOB: 1940/10/18 DOA: 06/09/2015 PCP: Diane Miyamoto, MD Admit HPI / Brief Narrative: [name]  PMHx Depression, HTN, Chronic LBBB , HLD,   Referred to the ER from cardiology office after patient was found to have slurred speech and dragging her left lower extremity. Patient has been having symptoms of dizziness off and on for last 2-3 weeks. Since Monday 4 days ago her symptoms had gotten worse. MRI of the brain shows multifocal patchy infarcts involving the pons and cerebellum. On-call neurologist [name] has been consulted and patient has been admitted for further workup. Patient denies any visual symptoms difficulty swallowing. Patient denies any chest pain or shortness of breath.    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  At this time CT angiogram of the head and neck has been ordered. Check 2-D echo. Aspirin. Gentle hydration. Physical therapy consult. Check hemoglobin A1c and lipid panel. -Echocardiogram pending -Carotid Dopplers pending -PT/OT pending  Hypertension  - allow for permissive hypertension. SBP goal 145-180  -Hold all BP medication. Contact on call physician if BP > 145-220/100-120 for medication instructions  Chronic LBBB. -Asymptomatic  Hyperlipidemia - intolerant to statins. -Niacin 250 mg QHS - give ASA 325 mg 30 minutes prior to administration niacin    Code Status: Full Family Communication: Husband and family present for exam Disposition Plan: Per stroke team    Consultants: [name] stroke team    Procedures: 3/17 CT head W Wo contrast;No acute intracranial hemorrhage, mass lesion, definite infarction, midline shift, herniation,  3/17 MRI brain;multi focal patchy acute ischemic infarcts involving the pons, Rt>>Lt; additional patchy infarcts extending into the middle  cerebellar hemispheres and left cerebellar hemisphere -Abnormal flow voids within the vertebrobasilar system, worrisome for heavy atheromatous disease, slow flow, or possibly even occlusion 3/18 CTA head/neck;-Occlusion of the proximal/mid basilar artery--PCAs demonstrate multifocal atheromatous irregularity with stenoses bilaterally.--moderate to severe narrowing of the supraclinoid ICAs bilaterally. --stenoses involving the proximal subclavian arteries, with associated narrowing of approximately 70% on the left, and 70-80% on the right. 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 --Moderate to severe stenosis at the origin of the dominant right vertebral artery  --RUL nodule 6 mm   Cultures NA  Antibiotics:  NA  DVT prophylaxis Lovenox    Objective: Filed Vitals:   06/10/15 2224 06/11/15 0300 06/11/15 0357 06/11/15 0729  BP: 154/61  134/76 128/70  Pulse: 61  63 60  Temp: 98.2 F (36.8 C)  98.6 F (37 C) 98.4 F (36.9 C)  TempSrc: Oral  Oral Oral  Resp: 20  18 16   Height:  5' (1.524 m)    Weight:  75.632 kg (166 lb 11.8 oz)    SpO2: 99%  96% 98%    Intake/Output Summary (Last 24 hours) at 06/11/15 5809 Last data filed at 06/10/15 1318  Gross per 24 hour  Intake      0 ml  Output      0 ml  Net      0 ml   Filed Weights   06/11/15 0300  Weight: 75.632 kg (166 lb 11.8 oz)     Exam: General:  A/O 4, NAD, No acute respiratory distress Eyes: Negative headache, double vision,negative scleral hemorrhage ENT: Negative Runny nose, negative gingival bleeding, Neck:  Negative scars, masses, torticollis, lymphadenopathy, JVD Lungs: Clear to auscultation bilaterally without wheezes or crackles Cardiovascular: Regular rate and rhythm without murmur gallop or rub normal S1 and S2 Abdomen:negative abdominal pain, negative dysphagia,  nondistended, positive soft, bowel sounds, no rebound, no ascites, no appreciable mass Extremities: No significant cyanosis, clubbing, or edema bilateral lower extremities Psychiatric:  Negative depression, negative anxiety, negative fatigue, negative mania Neurologic:  Cranial nerves II through XII intact, tongue/uvula midline, all extremities muscle strength 5/5, sensation intact throughout, finger nose finger bilateral pass pointing, quick finger touch bilateral within normal limits, negative Romberg sign, heel to shin bilateral unable to perform, standing on 1 foot bilateral unable to perform, walking on tiptoes unable to perform, walking on heels unable to perform, shuffling wide-based gait, negative dysarthria, negative expressive aphasia, negative receptive aphasia.     Data Reviewed: Basic Metabolic Panel:  Recent Labs Lab 06/09/15 1449 06/09/15 1547 06/10/15 0351 06/10/15 1535  NA 140 141  --  141  K 3.8 3.8  --  4.2  CL 108 105  --  110  CO2 24  --   --  23  GLUCOSE 97 91  --  109*  BUN 22* 27*  --  17  CREATININE 0.91 0.90 0.97 1.23*  CALCIUM 9.8  --   --  9.3   Liver Function Tests:  Recent Labs Lab 06/09/15 1449 06/10/15 1535  AST 19 17  ALT 14 11*  ALKPHOS 86 79  BILITOT 0.3 0.4  PROT 6.8 5.9*  ALBUMIN 3.5 3.1*   No results for input(s): LIPASE, AMYLASE in the last 168 hours. No results for input(s): AMMONIA in the last 168 hours. CBC:  Recent Labs Lab 06/09/15 1449 06/09/15 1547 06/10/15 0351 06/10/15 1535  WBC 4.9  --  5.5 4.3  NEUTROABS 2.5  --   --   --   HGB 13.0 14.6 11.8* 11.7*  HCT 40.1 43.0 36.6 35.9*  MCV 91.3  --  91.0 90.9  PLT 294  --  246 257   Cardiac Enzymes: No results for input(s): CKTOTAL, CKMB, CKMBINDEX, TROPONINI in the last 168 hours. BNP (last 3 results) No results for input(s): BNP in the last 8760 hours.  ProBNP (last 3 results) No results for input(s): PROBNP in the last 8760 hours.  CBG: No results for input(s):  GLUCAP in the last 168 hours.  No results found for this or any previous visit (from the past 240 hour(s)).   Studies: Ct Angio Head W/cm &/or Wo Cm  06/10/2015  CLINICAL DATA:  Initial valuation for acute stroke. EXAM: CT ANGIOGRAPHY HEAD AND NECK TECHNIQUE: Multidetector CT imaging of the head and neck was performed using the standard protocol during bolus administration of intravenous contrast. Multiplanar CT image reconstructions and MIPs were obtained to evaluate the vascular anatomy. Carotid stenosis measurements (when applicable) are obtained utilizing NASCET criteria, using the distal internal carotid diameter as the denominator. CONTRAST:  50mL OMNIPAQUE IOHEXOL 350 MG/ML SOLN COMPARISON:  Prior MRI and CT from 06/09/15 FINDINGS: CTA NECK Aortic arch: Visualized aortic arch of normal caliber with normal branch pattern. Common origin of the right brachiocephalic and left common carotid artery noted. Calcified plaque within the arch itself. 7 mm focus of intraluminal thrombus seen protruding into the aortic lumen of the aortic arch (series 402, image 27). 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 Distally left subclavian artery patent. Calcified plaque at the origin of the right subclavian  artery with associated narrowing of approximately 70-80%. Right subclavian artery opacified distally. No other high-grade stenosis at the origin of the great vessels. Right carotid system: Right common carotid artery patent from its origin to the bifurcation. Proximal right common carotid artery tortuous. Right carotid bifurcation somewhat low in the neck. Calcified atheromatous disease about the right carotid bifurcation without high-grade flow-limiting stenosis at the proximal right ICA. Right ICA tortuous but patent to the skullbase without flow-limiting stenosis, dissection, or occlusion. In stone note made of severe high-grade stenosis at  the origin of the right external carotid artery at the right carotid bifurcation. Left carotid system: Left common carotid artery patent from its origin to the bifurcation. Calcified noncalcified plaque about the left carotid bifurcation/proximal left ICA. Associated short-segment stenosis of approximately 50% by NASCET criteria. Distally, left ICA patent to the skullbase without flow-limiting stenosis, dissection, or occlusion. Note made of severe atheromatous narrowing at the origin of the left external carotid artery. Vertebral arteries:Both vertebral arteries arise from the subclavian arteries. Left vertebral artery diminutive. Atheromatous plaque at the origin of the right vertebral artery with moderate to severe narrowing. Vertebral arteries otherwise patent within the neck without flow-limiting stenosis or occlusion. Right vertebral artery dominant. Skeleton: No acute osseous abnormality. No worrisome lytic or blastic osseous lesions. Mild degenerative spondylolysis within the cervical spine. Other neck: 6 mm right upper lobe nodule noted. Scattered atelectatic changes within the visualized lungs. Visualized superior mediastinum demonstrates no acute process. Subcentimeter hypodense nodule noted within left lobe of thyroid, of doubtful clinical significance. No adenopathy within the neck. No acute soft tissue abnormality. CTA HEAD Anterior circulation: Mild short-segment stenosis of the proximal aspect of the horizontal petrous left ICA (series 402, image 164). A 2 segments otherwise widely patent. Scattered calcified atheromatous plaque within the cavernous ICAs with secondary mild narrowing. Calcified plaque within the supraclinoid ICAs with more moderate to severe narrowing. Left A1 segment hypoplastic/absent. Right A1 segment patent. Anterior communicating artery and anterior cerebral arteries well opacified. Multifocal atheromatous irregularity throughout the A2 segments, right worse than left. M1  segments patent without stenosis or occlusion. MCA bifurcations within normal limits. No proximal M2 branch occlusion. Distal MCA branches opacified and symmetric. Posterior circulation: Diminutive left vertebral artery essentially becomes occluded as it courses into the cranial vault. Dominant right vertebral artery demonstrates multi focal atheromatous irregularity with severe stenosis at its right V4 segment with near complete occlusion. Vertebrobasilar junction opacified as is the proximal basilar artery. Basilar artery subsequently becomes essentially occluded at the takeoff of the right anterior inferior cerebral artery. Basilar is occluded for approximately 1-1.5 cm. There is is minimal attenuated flow distally within the basilar artery, which may be retrograde in nature from the anterior circulation. Basilar tip may be narrowed and stenotic. Superior cerebral arteries are attenuated but grossly patent proximally. Left PCA supplied mainly via the left posterior communicating artery. Left PCA demonstrates multifocal atheromatous irregularity with stenoses but is opacified to its distal aspect. Faint right P1 segment present. Right PCA also supplied via a prominent right posterior communicating artery. Right PCA also demonstrates multifocal atheromatous irregularity but is opacified to its distal aspect is well. Venous sinuses: Not well evaluated on this exam, but grossly patent. Anatomic variants: No abnormal anatomic variant.  No aneurysm. Delayed phase: No abnormal enhancement on delayed sequence. Atrophy with moderate chronic small vessel ischemic disease. IMPRESSION: CTA NECK IMPRESSION: 1. 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 2. 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 3. Atheromatous narrowing of approximately 50% at the left carotid  bifurcation/proximal left ICA. No significant stenosis within the right ICA or common carotid arteries. 4. 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 5. Moderate to severe stenosis at the origin of the dominant right vertebral artery. Vertebral arteries otherwise patent within the neck. 6. 6 mm right upper lobe nodule, indeterminate. Non-contrast chest CT at 3-6 months is recommended. If the nodules are stable at time of repeat CT, then future CT at 18-24 months (from today's scan) is considered optional for low-risk patients, but is recommended for high-risk patients. This recommendation follows the consensus statement: Guidelines for Management of Incidental Pulmonary Nodules Detected on CT Images:From the Fleischner Society 2017; published online before print (10.1148/radiol.[id number]). CTA HEAD IMPRESSION: 1. 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 There is some thready attenuated distal reconstitution, possibly via retrograde flow from the anterior circulation. Superior cerebral arteries attenuated but patent proximally. PCAs patent and supplied mainly via the posterior communicating arteries. PCAs demonstrate multifocal atheromatous irregularity with stenoses bilaterally. 2. Extensive atheromatous disease with severe stenoses involving the dominant right V4 segment, which is nearly occluded prior to the vertebrobasilar junction. Diminutive left vertebral artery becomes occluded as it courses into the cranial vault. 3. Extensive atheromatous plaque throughout the cavernous/supraclinoid ICAs. There is moderate to severe narrowing of the supraclinoid ICAs bilaterally. 4. Multi focal atheromatous irregularity with narrowing within the anterior cerebral arteries bilaterally, right worse than left. 5. Short-segment mild to moderate stenosis within the proximal petrous left ICA. Critical Value/emergent results were called by telephone at the  time of interpretation on 06/10/2015 at 2:17 am to Dr. Heron Sabins , who verbally acknowledged these results. Electronically Signed   By: Rise Mu M.D.   On: 06/10/2015 02:32   Dg Chest 2 View  06/10/2015  CLINICAL DATA:  Status post CVA.  Initial encounter. EXAM: CHEST  2 VIEW COMPARISON:  Chest radiograph from 08/15/2005 FINDINGS: The lungs are hypoexpanded. Mild bibasilar opacities may reflect atelectasis or possibly mild pneumonia. No significant pleural effusion or pneumothorax is seen. The heart is mildly enlarged. A moderate to large hiatal hernia is noted, increased in size from the prior study. No acute osseous abnormalities are seen. IMPRESSION: 1. Lungs hypoexpanded. Mild bibasilar airspace opacities may reflect atelectasis or possibly mild pneumonia. 2. Mild cardiomegaly. 3. Moderate to large hiatal hernia, increased in size from 2007. Electronically Signed   By: Roanna Raider M.D.   On: 06/10/2015 02:23   Ct Head Wo Contrast  06/09/2015  CLINICAL DATA:  Difficulty walking, dizziness, hypertension EXAM: CT HEAD WITHOUT CONTRAST TECHNIQUE: Contiguous axial images were obtained from the base of the skull through the vertex without contrast. COMPARISON:  08/31/2009 brain MRI FINDINGS: Diffuse brain atrophy evident with moderately severe chronic white matter microvascular ischemic changes throughout the cerebral hemispheres. No acute intracranial hemorrhage, mass lesion, definite infarction, midline shift, herniation, hydrocephalus, or extra-axial fluid collection. No focal mass effect or edema. Cisterns are patent. Cerebellar atrophy as well. Atherosclerosis of the intracranial vessels. No skull abnormality. Mastoids and sinuses remain clear. IMPRESSION: Atrophy and chronic white matter microvascular changes. No acute process by noncontrast CT. Electronically Signed   By: Judie Petit.  Shick M.D.   On: 06/09/2015 16:07   Ct Angio Neck W/cm &/or Wo/cm  06/10/2015  CLINICAL DATA:  Initial valuation  for acute stroke. EXAM:  CT ANGIOGRAPHY HEAD AND NECK TECHNIQUE: Multidetector CT imaging of the head and neck was performed using the standard protocol during bolus administration of intravenous contrast. Multiplanar CT image reconstructions and MIPs were obtained to evaluate the vascular anatomy. Carotid stenosis measurements (when applicable) are obtained utilizing NASCET criteria, using the distal internal carotid diameter as the denominator. CONTRAST:  50mL OMNIPAQUE IOHEXOL 350 MG/ML SOLN COMPARISON:  Prior MRI and CT from 06/09/15 FINDINGS: CTA NECK Aortic arch: Visualized aortic arch of normal caliber with normal branch pattern. Common origin of the right brachiocephalic and left common carotid artery noted. Calcified plaque within the arch itself. 7 mm focus of intraluminal thrombus seen protruding into the aortic lumen of the aortic arch (series 402, image 27). 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 Distally left subclavian artery patent. Calcified plaque at the origin of the right subclavian artery with associated narrowing of approximately 70-80%. Right subclavian artery opacified distally. No other high-grade stenosis at the origin of the great vessels. Right carotid system: Right common carotid artery patent from its origin to the bifurcation. Proximal right common carotid artery tortuous. Right carotid bifurcation somewhat low in the neck. Calcified atheromatous disease about the right carotid bifurcation without high-grade flow-limiting stenosis at the proximal right ICA. Right ICA tortuous but patent to the skullbase without flow-limiting stenosis, dissection, or occlusion. In stone note made of severe high-grade stenosis at the origin of the right external carotid artery at the right carotid bifurcation. Left carotid system: Left common carotid artery patent from its origin to the bifurcation. Calcified noncalcified plaque  about the left carotid bifurcation/proximal left ICA. Associated short-segment stenosis of approximately 50% by NASCET criteria. Distally, left ICA patent to the skullbase without flow-limiting stenosis, dissection, or occlusion. Note made of severe atheromatous narrowing at the origin of the left external carotid artery. Vertebral arteries:Both vertebral arteries arise from the subclavian arteries. Left vertebral artery diminutive. Atheromatous plaque at the origin of the right vertebral artery with moderate to severe narrowing. Vertebral arteries otherwise patent within the neck without flow-limiting stenosis or occlusion. Right vertebral artery dominant. Skeleton: No acute osseous abnormality. No worrisome lytic or blastic osseous lesions. Mild degenerative spondylolysis within the cervical spine. Other neck: 6 mm right upper lobe nodule noted. Scattered atelectatic changes within the visualized lungs. Visualized superior mediastinum demonstrates no acute process. Subcentimeter hypodense nodule noted within left lobe of thyroid, of doubtful clinical significance. No adenopathy within the neck. No acute soft tissue abnormality. CTA HEAD Anterior circulation: Mild short-segment stenosis of the proximal aspect of the horizontal petrous left ICA (series 402, image 164). A 2 segments otherwise widely patent. Scattered calcified atheromatous plaque within the cavernous ICAs with secondary mild narrowing. Calcified plaque within the supraclinoid ICAs with more moderate to severe narrowing. Left A1 segment hypoplastic/absent. Right A1 segment patent. Anterior communicating artery and anterior cerebral arteries well opacified. Multifocal atheromatous irregularity throughout the A2 segments, right worse than left. M1 segments patent without stenosis or occlusion. MCA bifurcations within normal limits. No proximal M2 branch occlusion. Distal MCA branches opacified and symmetric. Posterior circulation: Diminutive left  vertebral artery essentially becomes occluded as it courses into the cranial vault. Dominant right vertebral artery demonstrates multi focal atheromatous irregularity with severe stenosis at its right V4 segment with near complete occlusion. Vertebrobasilar junction opacified as is the proximal basilar artery. Basilar artery subsequently becomes essentially occluded at the takeoff of the right anterior inferior cerebral artery. Basilar is occluded  for approximately 1-1.5 cm. There is is minimal attenuated flow distally within the basilar artery, which may be retrograde in nature from the anterior circulation. Basilar tip may be narrowed and stenotic. Superior cerebral arteries are attenuated but grossly patent proximally. Left PCA supplied mainly via the left posterior communicating artery. Left PCA demonstrates multifocal atheromatous irregularity with stenoses but is opacified to its distal aspect. Faint right P1 segment present. Right PCA also supplied via a prominent right posterior communicating artery. Right PCA also demonstrates multifocal atheromatous irregularity but is opacified to its distal aspect is well. Venous sinuses: Not well evaluated on this exam, but grossly patent. Anatomic variants: No abnormal anatomic variant.  No aneurysm. Delayed phase: No abnormal enhancement on delayed sequence. Atrophy with moderate chronic small vessel ischemic disease. IMPRESSION: CTA NECK IMPRESSION: 1. 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 2. 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 3. Atheromatous narrowing of approximately 50% at the left carotid bifurcation/proximal left ICA. No significant stenosis within the right ICA or common carotid arteries. 4. 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 5. Moderate  to severe stenosis at the origin of the dominant right vertebral artery. Vertebral arteries otherwise patent within the neck. 6. 6 mm right upper lobe nodule, indeterminate. Non-contrast chest CT at 3-6 months is recommended. If the nodules are stable at time of repeat CT, then future CT at 18-24 months (from today's scan) is considered optional for low-risk patients, but is recommended for high-risk patients. This recommendation follows the consensus statement: Guidelines for Management of Incidental Pulmonary Nodules Detected on CT Images:From the Fleischner Society 2017; published online before print (10.1148/radiol.0981191478). CTA HEAD IMPRESSION: 1. 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 There is some thready attenuated distal reconstitution, possibly via retrograde flow from the anterior circulation. Superior cerebral arteries attenuated but patent proximally. PCAs patent and supplied mainly via the posterior communicating arteries. PCAs demonstrate multifocal atheromatous irregularity with stenoses bilaterally. 2. Extensive atheromatous disease with severe stenoses involving the dominant right V4 segment, which is nearly occluded prior to the vertebrobasilar junction. Diminutive left vertebral artery becomes occluded as it courses into the cranial vault. 3. Extensive atheromatous plaque throughout the cavernous/supraclinoid ICAs. There is moderate to severe narrowing of the supraclinoid ICAs bilaterally. 4. Multi focal atheromatous irregularity with narrowing within the anterior cerebral arteries bilaterally, right worse than left. 5. Short-segment mild to moderate stenosis within the proximal petrous left ICA. Critical Value/emergent results were called by telephone at the time of interpretation on 06/10/2015 at 2:17 am to Dr. Heron Sabins , who verbally acknowledged these results. Electronically Signed   By: Rise Mu M.D.   On: 06/10/2015 02:32   Mr Brain Wo Contrast  06/09/2015   CLINICAL DATA:  Initial evaluation for slurred speech, left-sided weakness. EXAM: MRI HEAD WITHOUT CONTRAST TECHNIQUE: Multiplanar, multiecho pulse sequences of the brain and surrounding structures were obtained without intravenous contrast. COMPARISON:  Prior CT from earlier the same day as well as previous MRI from 08/31/2009. FINDINGS: Diffuse prominence of the CSF containing spaces compatible with generalized cerebral atrophy. Patchy T2/FLAIR hyperintensity within the periventricular and deep white matter both cerebral hemispheres most consistent with chronic small vessel ischemic disease, moderate nature. Small vessel type changes present within the pons as well. Scatter remote lacunar infarcts within the bilateral basal ganglia, left thalamus, pons, and left  cerebellar hemisphere. There are multi focal patchy acute ischemic infarcts involving the pons, right greater than left, with additional patchy infarcts extending into the middle cerebellar hemispheres and left cerebellar hemisphere. For reference purposes, largest infarct within the pons present within the right aspect of the pons and measures approximately 11 mm. Largest infarct within the left cerebellar hemisphere measures approximately 15 mm. No significant mass effect. No associated hemorrhage. Abnormal flow voids present within the vertebrobasilar system, worrisome for heavy atheromatous disease and/ or slow flow or even possibly occlusion cleared normal vascular flow voids present within the anterior circulation. Gray-white matter differentiation otherwise maintained. No supratentorial infarcts. No mass lesion, midline shift, or mass effect. No hydrocephalus. No extra-axial fluid collection. Craniocervical junction normal. Mild multilevel degenerative spondylolysis present within the upper cervical spine without significant stenosis. Pituitary gland within normal limits. No acute abnormality about the orbits. Sequela prior bilateral lens extraction  noted. Paranasal sinuses are clear. No mastoid effusion. Inner ear structures within normal limits. Bone marrow signal intensity normal. No scalp soft tissue abnormality. IMPRESSION: 1. Multi focal patchy acute ischemic infarcts involving the pons, bilateral middle cerebellar peduncle is, and left cerebellar hemisphere. No associated hemorrhage or mass effect. 2. Abnormal flow voids within the vertebrobasilar system, worrisome for heavy atheromatous disease, slow flow, or possibly even occlusion. Further evaluation with dedicated CTA recommended. 3. Generalized cerebral atrophy with moderate chronic small vessel ischemic disease. Results were called by telephone at the time of interpretation on 06/09/2015 at 11:00 pm to Dr. Azalia Bilis , who verbally acknowledged these results. Electronically Signed   By: Rise Mu M.D.   On: 06/09/2015 23:01   Mr Maxine Glenn Head/brain Wo Cm  06/10/2015  CLINICAL DATA:  Initial evaluation for acute stroke. EXAM: MRA HEAD WITHOUT CONTRAST TECHNIQUE: Angiographic images of the Circle of Willis were obtained using MRA technique without intravenous contrast. COMPARISON:  Prior CTA performed earlier the same day. FINDINGS: ANTERIOR CIRCULATION: Visualized distal cervical segments of the internal carotid arteries are patent with antegrade flow. Mild focal narrowing of the proximal petrous left ICA. Petrous segments otherwise widely patent. Scattered atheromatous irregularity within the cavernous/ supraclinoid ICAs with secondary moderate to severe narrowing, up the radius at the supraclinoid segments. Left A1 segment hypoplastic and/ or absent. Right A1 segment patent. Anterior communicating artery patent. Anterior cerebral arteries demonstrate mild multi focal air irregularity but are patent to the distal aspects. M1 segments patent without stenosis or occlusion. MCA bifurcations normal. Distal MCA branches opacified and fairly symmetric. Distal small vessel irregularity within  the MCA branches bilaterally. POSTERIOR CIRCULATION: Absent flow within the vertebral arteries as they course into the cranial vault. Absent flow within the basilar artery. Superior cerebellar arteries not visualized. Bilateral posterior communicating arteries present supplying the posterior cerebral arteries. Multifocal atheromatous irregularity within the posterior cerebral arteries bilaterally. Distal PCAs not well evaluated on this exam. No aneurysm or vascular malformation. IMPRESSION: 1. Absent flow within the vertebral arteries and basilar artery, better evaluated on prior CTA of the head and neck performed earlier the same day. Superior cerebral arteries not visualized on this exam. Bilateral PCAs supplied via the posterior communicating arteries. 2. Multi focal atheromatous irregularity throughout the cavernous/supraclinoid ICAs with secondary moderate to severe multi focal narrowing, greatest at the supraclinoid segments. 3. Focal mild to moderate focal stenosis within the proximal petrous left ICA. 4. Distal atheromatous irregularity within knee ACAs and MCAs bilaterally. Findings regarding the vertebrobasilar system were discussed with Dr. Cena Benton at time of dictation for previous  CTA of the head and neck performed earlier the same day. Electronically Signed   By: Rise Mu M.D.   On: 06/10/2015 04:50    Scheduled Meds: .  stroke: mapping our early stages of recovery book   Does not apply Once  . amLODipine  10 mg Oral q1800  . aspirin EC  325 mg Oral Daily  . clopidogrel  75 mg Oral Daily  . enoxaparin (LOVENOX) injection  40 mg Subcutaneous Q24H  . latanoprost  1 drop Both Eyes QHS  . losartan  100 mg Oral Daily  . niacin  250 mg Oral QHS   Continuous Infusions:   Principal Problem:   Cerebral thrombosis with cerebral infarction Active Problems:   HTN (hypertension)   Hyperlipidemia   Stroke (cerebrum) (HCC)    Time spent: 45 minute    Wong Steadham J  [facility] Pager [phone]. If 7PM-7AM, please contact night-coverage at [email or web address], password [facility] 06/11/2015, 8:52 AM  LOS: 1 day    Care during the described time interval was provided by me .  I have reviewed this patient's available data, including medical history, events of note, physical examination, and all test results as part of my evaluation. I have personally reviewed and interpreted all radiology studies.   Carolyne Littles, MD [phone] Pager

## 2015-06-12 DIAGNOSIS — I6330 Cerebral infarction due to thrombosis of unspecified cerebral artery: Secondary | ICD-10-CM

## 2015-06-12 LAB — HEMOGLOBIN A1C
HEMOGLOBIN A1C: 5.8 % — AB (ref 4.8–5.6)
Mean Plasma Glucose: 120 mg/dL

## 2015-06-12 NOTE — Progress Notes (Addendum)
STROKE TEAM PROGRESS NOTE   HISTORY OF PRESENT ILLNESS [name] is a 75 y.o. female who per notes: Daughter gives most of the history. Has had slurred speech and dragging her left leg since Monday - multiple falls - says she feels "drunk" and dizzy. Ms. Levert refused to go to the hospital earlier this week. She is only on aspirin therapy and she says she has been taking it. She has known hyperlipidemia but she cannot take statins because she becomes weak. 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   LKW: last monday tpa given?: no - outside window   SUBJECTIVE (INTERVAL HISTORY) Feels better. Getting stronger. Can't take statins due to weakness, myalgias.    OBJECTIVE Temp:  [98 F (36.7 C)-99.1 F (37.3 C)] 98 F (36.7 C) (03/20 1444) Pulse Rate:  [57-67] 67 (03/20 1444) Cardiac Rhythm:  [-] Normal sinus rhythm (03/20 1900) Resp:  [18] 18 (03/20 1444) BP: (129-180)/(61-78) 180/78 mmHg (03/20 1444) SpO2:  [94 %-98 %] 98 % (03/20 1444)  CBC:  Recent Labs Lab 06/09/15 1449  06/10/15 1535 06/11/15 1405  WBC 4.9  < > 4.3 4.3  NEUTROABS 2.5  --   --   --   HGB 13.0  < > 11.7* 12.7  HCT 40.1  < > 35.9* 38.3  MCV 91.3  < > 90.9 91.8  PLT 294  < > 257 309  < > = values in this interval not displayed.  Basic Metabolic Panel:   Recent Labs Lab 06/10/15 1535 06/11/15 1405  NA 141 140  K 4.2 4.0  CL 110 109  CO2 23 25  GLUCOSE 109* 119*  BUN 17 16  CREATININE 1.23* 1.20*  CALCIUM 9.3 9.7    Lipid Panel:     Component Value Date/Time   CHOL 234* 06/09/2015 2359   TRIG 242* 06/09/2015 2359   HDL 31* 06/09/2015 2359   CHOLHDL 7.5 06/09/2015 2359   VLDL 48* 06/09/2015 2359   LDLCALC 155* 06/09/2015 2359   HgbA1c:  Lab Results  Component Value Date   HGBA1C 5.8* 06/09/2015   Urine Drug Screen: No results found for: LABOPIA, COCAINSCRNUR, LABBENZ, AMPHETMU, THCU, LABBARB    IMAGING   CTA NECK   06/10/2015 1. Atheromatous stenoses involving the  proximal subclavian arteries, with associated narrowing of approximately 70% on the left, and 70-80% on the right.  2. 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  3. Atheromatous narrowing of approximately 50% at the left carotid bifurcation/proximal left ICA. No significant stenosis within the right ICA or common carotid arteries.  4. 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  5. Moderate to severe stenosis at the origin of the dominant right vertebral artery. Vertebral arteries otherwise patent within the neck.  6. 6 mm right upper lobe nodule, indeterminate. Non-contrast chest CT at 3-6 months is recommended. If the nodules are stable at time of repeat CT, then future CT at 18-24 months (from today's scan) is considered optional for low-risk patients, but is recommended for high-risk patients. This recommendation follows the consensus statement: Guidelines for Management of Incidental Pulmonary Nodules Detected on CT Images:From the Fleischner Society 2017; published online before print (10.1148/radiol.6967893810).    CTA HEAD   06/10/2015 1. 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 There is some thready attenuated distal reconstitution, possibly via retrograde flow from the anterior circulation. Superior cerebral arteries attenuated but patent proximally. PCAs  patent and supplied mainly via the posterior communicating arteries. PCAs demonstrate multifocal atheromatous irregularity with stenoses bilaterally.  2. Extensive atheromatous disease with severe stenoses involving the dominant right V4 segment, which is nearly occluded prior to the vertebrobasilar junction.   Diminutive left vertebral artery becomes occluded as it courses into the cranial vault.  3. Extensive atheromatous plaque throughout the cavernous/supraclinoid ICAs.   There is moderate to severe narrowing of the supraclinoid  ICAs bilaterally.  4. Multi focal atheromatous irregularity with narrowing within the anterior cerebral arteries bilaterally, right worse than left.  5. Short-segment mild to moderate stenosis within the proximal petrous left ICA.     Dg Chest 2 View 06/10/2015   1. Lungs hypoexpanded. Mild bibasilar airspace opacities may reflect atelectasis or possibly mild pneumonia.  2. Mild cardiomegaly.  3. Moderate to large hiatal hernia, increased in size from 2007.     Ct Head Wo Contrast 06/09/2015   Atrophy and chronic white matter microvascular changes. No acute process by noncontrast CT.     Mr Brain Wo Contrast 06/09/2015   1. Multi focal patchy acute ischemic infarcts involving the pons, bilateral middle cerebellar peduncle is, and left cerebellar hemisphere. No associated hemorrhage or mass effect.  2. Abnormal flow voids within the vertebrobasilar system, worrisome for heavy atheromatous disease, slow flow, or possibly even occlusion. Further evaluation with dedicated CTA recommended. (CTA 06/10/15) - see above 3. Generalized cerebral atrophy with moderate chronic small vessel ischemic disease.    Mr Maxine Glenn Head/brain Wo Cm 06/10/2015   1. Absent flow within the vertebral arteries and basilar artery, better evaluated on prior CTA of the head and neck performed earlier the same day. Superior cerebral arteries not visualized on this exam. Bilateral PCAs supplied via the posterior communicating arteries.  2. Multi focal atheromatous irregularity throughout the cavernous/supraclinoid ICAs with secondary moderate to severe multi focal narrowing, greatest at the supraclinoid segments.  3. Focal mild to moderate focal stenosis within the proximal petrous left ICA.  4. Distal atheromatous irregularity within knee ACAs and MCAs bilaterally.     PHYSICAL EXAM  Physical exam: Exam: Gen: NAD Eyes: anicteric sclerae, moist conjunctivae                    CV: no RG, +SEM no carotid bruits, no  peripheral edema Mental Status: Alert, follows commands, good historian, oriented to name, date, location  Neuro: Detailed Neurologic Exam  Speech:    No aphasia, no dysarthria, can repeat and name without difficulty  Cranial Nerves:    The pupils are equal, round, and reactive to light.. Attempted, Fundi not visualized.  EOMI. Conjugate, midline gaze.  Visual fields full. Face symmetric, Tongue midline. Hearing intact to voice. Shoulder shrug intact. Uvula elevates symmetrically.   Motor Observation:    no involuntary movements noted. Tone appears normal.     Strength:    Appears intact with some mild left leg drift     Sensation:  Intact to LT  Plantars downgoing.   Coordination: No dysmetria  DTRs: Absent Ajs otherwise 1+     ASSESSMENT/PLAN [name] is a 75 y.o. female with history of hyperlipidemia, hypertension, and chronic left bundle branch block, presenting with slurred speech, left lower extremity weakness, dizziness, and multiple falls.. She did not receive IV t-PA due to late presentation.  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  MRA  diffuse cerebrovascular disease as noted above.  Carotid Doppler  refer to CTA of the neck  2D Echo EF 65-70%. No cardiac source of emboli identified.  LDL 155, reports stain intolerance  HgbA1c 5.8  VTE prophylaxis - Lovenox Diet Heart Room service appropriate?: Yes; Fluid consistency:: Thin  aspirin 325 mg daily prior to admission, now on aspirin 325 mg daily and clopidogrel 75 mg daily for 3 months and then Plavix alone.  Patient counseled to be compliant with her antithrombotic medications  Ongoing aggressive stroke risk factor  management  Therapy recommendations:  Home health  Disposition:  Pending  Hypertension  Stable  Permissive hypertension (OK if < 220/120) but gradually normalize in 5-7 days. Eventual BP goal 130-150 due to right ICA high grade stenosis  Hyperlipidemia  Home meds: No lipid lowering medications prior to admission.  LDL 155, goal < 70  The patient is intolerant to statins  Will try Niacin 250 mg QHS - give ASA 325 mg 30 minutes before niacin to prevent flushing.  Other Stroke Risk Factors  Advanced age   Other Active Problems  Pulmonary nodule - F/U in 3 - 6 months.   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 Aggressive risk factor modification. Discharge home and follow-up as an outpatient in the stroke clinic in 2 months.  Personally examined patient and images, and have participated in and made any corrections needed to history, physical, neuro exam,assessment and plan as stated above.  I have personally obtained the history, evaluated lab date, reviewed imaging studies and agree with radiology interpretations. . Recommend aspirin and Plavix for 3 months followed by Plavix alone. Stroke service will sign off. Kindly follow-up as an outpatient in the stroke clinic in 2 months. Kindly call for questions  Delia Heady, MD Stroke Neurology [phone] [facility]      To contact Stroke Continuity provider, please refer to [email or web address]. After hours, contact General Neurology

## 2015-06-12 NOTE — Evaluation (Addendum)
Occupational Therapy Evaluation Patient Details Name: [name] MRN: [id number] DOB: Aug 13, 1940 Today's Date: 06/12/2015    History of Present Illness Pt admitted for slurred speech and dragging LLE. MRI revealed acute ischemic infarcts involving the pons, bilateral middle cerebellar peduncle and left cerebellar hemisphere. PMH includes GERD, depression, anemia, glaucoma, hyperlipidemia, HTN, back surgery, THA.    Clinical Impression   Pt admitted with above. Pt independent with ADLs, PTA. Feel pt will benefit from acute OT to increase independence prior to d/c. Recommending HHOT upon d/c and 24/7 supervision/assistance.    Follow Up Recommendations  Home health OT;Supervision/Assistance - 24 hour    Equipment Recommendations  3 in 1 bedside comode (if she does not have one already)    Recommendations for Other Services       Precautions / Restrictions Precautions Precautions: Fall Restrictions Weight Bearing Restrictions: No      Mobility Bed Mobility        General bed mobility comments: not assessed  Transfers Overall transfer level: Needs assistance Transfers: Sit to/from Stand Sit to Stand: Min guard            Balance     Min assist for ambulation at times for balance.                     ADL Overall ADL's : Needs assistance/impaired                     Lower Body Dressing: Min guard;Sit to/from stand   Toilet Transfer: Minimal assistance;Ambulation (sit to stand from chair)           Functional mobility during ADLs: Minimal assistance (for ambulation) General ADL Comments: Educated on safety such as sitting for LB ADLs. Educated on options for shower chair. Recommended someone be with pt 24/7.  Recommended they ask MD about driving.     Vision Pt wears glasses; reports no change from baseline; pt has glaucoma  Vision Assessment?: Yes Visual Fields: No apparent deficits   Perception     Praxis      Pertinent  Vitals/Pain Pain Assessment: No/denies pain     Hand Dominance     Extremity/Trunk Assessment Upper Extremity Assessment Upper Extremity Assessment: RUE deficits/detail;Generalized weakness;LUE deficits/detail RUE Deficits / Details: weakness in shoulder flexors RUE Coordination: decreased gross motor;decreased fine motor (fine motor seemed worse in right hand vs left) LUE Deficits / Details: weakness in shoulder flexors LUE Coordination: decreased gross motor;decreased fine motor   Lower Extremity Assessment Lower Extremity Assessment: Defer to PT evaluation       Communication Communication Communication: No difficulties   Cognition Arousal/Alertness: Awake/alert Behavior During Therapy: WFL for tasks assessed/performed Overall Cognitive Status:  (unsure of baseline-pt doesn't seem fully aware of need for therapy and seems to blame deficits on things other than stroke)                      General Comments       Exercises Exercises: Other exercises Other Exercises Other Exercises: educated on fine and gross motor coordination activities   Shoulder Instructions      Home Living Family/patient expects to be discharged to:: Private residence Living Arrangements: Spouse/significant other Available Help at Discharge: Family;Available 24 hours/day Type of Home: House Home Access: Ramped entrance     Home Layout: One level     Bathroom Shower/Tub: Producer, television/film/video: Standard     Home Equipment: Environmental consultant -  2 wheels;Cane - single point (per PT eval, pt has BSC?)   Additional Comments: home accessible from when her sister lived with them.  She has some medical equipment from old hip surgery.      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 Can sit or stand to work; pt drives; Spouse assists with IADLs.     OT Diagnosis: Generalized weakness;Other (comment) (decreased balance; decreased coordination)   OT Problem List: Decreased strength;Decreased range of motion;Decreased activity tolerance;Impaired balance (sitting and/or standing);Decreased coordination;Impaired vision/perception;Decreased knowledge of use of DME or AE;Decreased cognition   OT Treatment/Interventions: DME and/or AE instruction;Therapeutic activities;Patient/family education;Balance training;Cognitive remediation/compensation;Self-care/ADL training;Therapeutic exercise    OT Goals(Current goals can be found in the care plan section) Acute Rehab OT Goals Patient Stated Goal: not stated OT Goal Formulation: With patient Time For Goal Achievement: 06/19/15 Potential to Achieve Goals: Good ADL Goals Pt Will Perform Lower Body Dressing: with supervision;sit to/from stand (including gathering items) Pt Will Transfer to Toilet: with supervision;ambulating;regular height toilet Pt Will Perform Tub/Shower Transfer: Shower transfer;with supervision;ambulating;3 in 1;with set-up Additional ADL Goal #1: Pt will independently perform HEP for bilateral UEs to increase strength and coordination.  OT Frequency: Min 2X/week   Barriers to D/C:            Co-evaluation              End of Session Equipment Utilized During Treatment: Gait belt Nurse Communication: Other (comment) (HHOT recommendation)  Activity Tolerance: Patient tolerated treatment well Patient left: in chair;with chair alarm set;with family/visitor present   Time: 8184-0375 OT Time Calculation (min): 22 min Charges:  OT General Charges $OT Visit: 1 Procedure OT Evaluation $OT Eval Moderate Complexity: 1 Procedure G-CodesEarlie Raveling OTR/L [phone] 06/12/2015, 3:53 PM

## 2015-06-12 NOTE — Progress Notes (Signed)
[facility] PROGRESS NOTE  [name] PJA:[id number] DOB: 01-May-1940 DOA: 06/09/2015 PCP: [name], MD Admit HPI / Brief Narrative: [name]  PMHx Depression, HTN, Chronic LBBB , HLD,   Referred to the ER from cardiology office after patient was found to have slurred speech and dragging her left lower extremity. Patient has been having symptoms of dizziness off and on for last 2-3 weeks. Since Monday 4 days ago her symptoms had gotten worse. MRI of the brain shows multifocal patchy infarcts involving the pons and cerebellum. On-call neurologist Dr. Adline Peals has been consulted and patient has been admitted for further workup. Patient denies any visual symptoms difficulty swallowing. Patient denies any chest pain or shortness of breath.    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  At this time CT angiogram of the head and neck has been ordered. Check 2-D echo. Aspirin. Gentle hydration. Physical therapy consult who is recommending home health PT - lipid panel reviewed -Echocardiogram completed: EF of 65-70%  -PT/OT : Home health Pt and OT and 3 in 1 bedside comode.  Hypertension  - allow for permissive hypertension. SBP goal 145-180  -Hold all BP medication. Contact on call physician if BP 220/120 or more for medication instructions  Chronic LBBB. -Asymptomatic  Hyperlipidemia - intolerant to statins. -Niacin 250 mg QHS - give ASA 325 mg 30 minutes prior to administration niacin    Code Status: Full Family Communication: Husband and family present for exam Disposition Plan: Per stroke team    Consultants: Neurology stroke team  Procedures: 3/17 CT head W Wo contrast;No acute intracranial hemorrhage, mass lesion, definite infarction, midline shift, herniation,  3/17 MRI brain;multi focal patchy acute ischemic infarcts involving the pons, Rt>>Lt; additional  patchy infarcts extending into the middle cerebellar hemispheres and left cerebellar hemisphere -Abnormal flow voids within the vertebrobasilar system, worrisome for heavy atheromatous disease, slow flow, or possibly even occlusion 3/18 CTA head/neck;-Occlusion of the proximal/mid basilar artery--PCAs demonstrate multifocal atheromatous irregularity with stenoses bilaterally.--moderate to severe narrowing of the supraclinoid ICAs bilaterally. --stenoses involving the proximal subclavian arteries, with associated narrowing of approximately 70% on the left, and 70-80% on the right. 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 --Moderate to severe stenosis at the origin of the dominant right vertebral artery  --RUL nodule 6 mm   Cultures NA  Antibiotics:  NA  DVT prophylaxis Lovenox    Objective: Filed Vitals:   06/11/15 2130 06/12/15 0154 06/12/15 1021 06/12/15 1444  BP: 129/73 150/61 139/62 180/78  Pulse: 67 66 57 67  Temp: 99.1 F (37.3 C) 98.3 F (36.8 C) 98.7 F (37.1 C) 98 F (36.7 C)  TempSrc: Oral Oral Oral Oral  Resp: 18 18 18 18   Height:      Weight:      SpO2: 94% 95% 98% 98%   No intake or output data in the 24 hours ending 06/12/15 1634 Filed Weights   06/11/15 0300  Weight: 75.632 kg (166 lb 11.8 oz)     Exam: General:  A/O 4, NAD, No acute respiratory distress Eyes: no icterus ENT: moist mucous membranes Neck:  supple Lungs: Clear to auscultation bilaterally without wheezes or crackles Cardiovascular: Regular rate and rhythm without murmur gallop or rub normal S1 and S2 Abdomen:negative abdominal pain, negative dysphagia, nondistended, positive soft, bowel  sounds, no rebound, no ascites, no appreciable mass Extremities: No significant cyanosis, clubbing, or edema bilateral lower extremities Psychiatric:   Negative depression, negative anxiety, negative fatigue, negative mania Neurologic:  No tremors, no aphasia    Data Reviewed: Basic Metabolic Panel:  Recent Labs Lab 06/09/15 1449 06/09/15 1547 06/10/15 0351 06/10/15 1535 06/11/15 1405  NA 140 141  --  141 140  K 3.8 3.8  --  4.2 4.0  CL 108 105  --  110 109  CO2 24  --   --  23 25  GLUCOSE 97 91  --  109* 119*  BUN 22* 27*  --  17 16  CREATININE 0.91 0.90 0.97 1.23* 1.20*  CALCIUM 9.8  --   --  9.3 9.7   Liver Function Tests:  Recent Labs Lab 06/09/15 1449 06/10/15 1535  AST 19 17  ALT 14 11*  ALKPHOS 86 79  BILITOT 0.3 0.4  PROT 6.8 5.9*  ALBUMIN 3.5 3.1*   No results for input(s): LIPASE, AMYLASE in the last 168 hours. No results for input(s): AMMONIA in the last 168 hours. CBC:  Recent Labs Lab 06/09/15 1449 06/09/15 1547 06/10/15 0351 06/10/15 1535 06/11/15 1405  WBC 4.9  --  5.5 4.3 4.3  NEUTROABS 2.5  --   --   --   --   HGB 13.0 14.6 11.8* 11.7* 12.7  HCT 40.1 43.0 36.6 35.9* 38.3  MCV 91.3  --  91.0 90.9 91.8  PLT 294  --  246 257 309   Cardiac Enzymes: No results for input(s): CKTOTAL, CKMB, CKMBINDEX, TROPONINI in the last 168 hours. BNP (last 3 results) No results for input(s): BNP in the last 8760 hours.  ProBNP (last 3 results) No results for input(s): PROBNP in the last 8760 hours.  CBG: No results for input(s): GLUCAP in the last 168 hours.  No results found for this or any previous visit (from the past 240 hour(s)).   Studies: No results found.  Scheduled Meds: .  stroke: mapping our early stages of recovery book   Does not apply Once  . aspirin EC  325 mg Oral Daily  . clopidogrel  75 mg Oral Daily  . enoxaparin (LOVENOX) injection  40 mg Subcutaneous Q24H  . latanoprost  1 drop Both Eyes QHS  . niacin  250 mg Oral QHS   Continuous Infusions:   Principal Problem:   Cerebral thrombosis with cerebral infarction Active Problems:   HTN (hypertension)   Hyperlipidemia    Stroke (cerebrum) (HCC)   Acute embolic stroke (HCC)    Time spent: 3 minute    Penny Pia  [facility] Pager [phone]. If 7PM-7AM, please contact night-coverage at [email or web address], password [facility] 06/12/2015, 4:34 PM  LOS: 2 days

## 2015-06-12 NOTE — Progress Notes (Signed)
Physical Therapy Treatment Patient Details Name: [name] MRN: [id number] DOB: 21-Jun-1940 Today's Date: 06/12/2015    History of Present Illness Pt with onset of symptoms 3/13 including difficulty with walking.  She did not seek medical attention until ~1 week later, determined she sustained bil brainstem CVAs.    PT Comments    Patient remains very unsteady when no device used in standing/walking. Requires vc for safe use of RW during transfers and ambulation. Discussed goals for HHPT and pt insists she does not need HHPT.   Follow Up Recommendations  Home health PT;Supervision for mobility/OOB (pt could benefit from balance training, however will likely refuse HHPT)     Equipment Recommendations  None recommended by PT    Recommendations for Other Services       Precautions / Restrictions Precautions Precautions: Fall Restrictions Weight Bearing Restrictions: No    Mobility  Bed Mobility Overal bed mobility: Independent             General bed mobility comments: supine to sit with raised HOB  Transfers Overall transfer level: Needs assistance Equipment used: Rolling walker (2 wheeled) Transfers: Sit to/from Stand Sit to Stand: Min assist;Supervision         General transfer comment: without device requires assist to steady upon standing (pt reaching for UE support); with RW required vc for proper use  Ambulation/Gait Ambulation/Gait assistance: Min assist;Supervision Ambulation Distance (Feet): 100 Feet (35) Assistive device: Rolling walker (2 wheeled) Gait Pattern/deviations: Step-through pattern;Decreased stride length;Trendelenburg   Gait velocity interpretation: Below normal speed for age/gender General Gait Details: slow cadence, prior Lt hip fx with Trendelenburg gait   Stairs            Wheelchair Mobility    Modified Rankin (Stroke Patients Only) Modified Rankin (Stroke Patients Only) Pre-Morbid Rankin Score: No  symptoms Modified Rankin: Moderately severe disability     Balance     Sitting balance-Leahy Scale: Normal Sitting balance - Comments: performed toileting hygiene without difficulty   Standing balance support: Single extremity supported Standing balance-Leahy Scale: Poor Standing balance comment: attempted no UE support and pt loses balance and grabbing to hold onto bedrail or PT                    Cognition Arousal/Alertness: Awake/alert Behavior During Therapy: WFL for tasks assessed/performed Overall Cognitive Status: Within Functional Limits for tasks assessed                      Exercises      General Comments        Pertinent Vitals/Pain Pain Assessment: No/denies pain    Home Living                      Prior Function            PT Goals (current goals can now be found in the care plan section) Acute Rehab PT Goals Patient Stated Goal: to go home and retire Time For Goal Achievement: 06/24/15 Progress towards PT goals: Progressing toward goals    Frequency  Min 3X/week    PT Plan Current plan remains appropriate    Co-evaluation             End of Session Equipment Utilized During Treatment: Gait belt Activity Tolerance: Patient tolerated treatment well Patient left: in chair;with call bell/phone within reach;with chair alarm set     Time: 1415-1430 PT Time Calculation (min) (ACUTE ONLY): 15  min  Charges:  $Gait Training: 8-22 mins                    G Codes:      Theodoros Stjames 06-17-15, 2:39 PM Pager [phone]

## 2015-06-13 ENCOUNTER — Encounter (HOSPITAL_COMMUNITY): Payer: Medicare Other

## 2015-06-13 MED ORDER — NIACIN ER 250 MG PO CPCR: 250.0000 mg | ORAL_CAPSULE | Freq: Every day | ORAL | Status: DC

## 2015-06-13 MED ORDER — ASPIRIN 325 MG PO TABS: 325.0000 mg | ORAL_TABLET | Freq: Every day | ORAL | Status: AC

## 2015-06-13 MED ORDER — CLOPIDOGREL BISULFATE 75 MG PO TABS: 75.0000 mg | ORAL_TABLET | Freq: Every day | ORAL | Status: DC

## 2015-06-13 NOTE — Care Management Note (Signed)
Case Management Note  Patient Details  Name: [name] MRN: [id number] Date of Birth: 1940/11/25  Subjective/Objective:    Patient admitted with CVA. She is from home with her spouse.                Action/Plan: PT/OT recommending HH services. CM following for d/c needs.   Expected Discharge Date:                  Expected Discharge Plan:     In-House Referral:     Discharge planning Services     Post Acute Care Choice:    Choice offered to:     DME Arranged:    DME Agency:     HH Arranged:    HH Agency:     Status of Service:  In process, will continue to follow  Medicare Important Message Given:    Date Medicare IM Given:    Medicare IM give by:    Date Additional Medicare IM Given:    Additional Medicare Important Message give by:     If discussed at Long Length of Stay Meetings, dates discussed:    Additional Comments:  Kermit Balo, RN 06/13/2015, 11:56 AM

## 2015-06-13 NOTE — Progress Notes (Signed)
Occupational Therapy Treatment Patient Details Name: [name] MRN: [id number] DOB: [birth year] Today's Date: 06/13/2015    History of present illness Pt admitted for slurred speech and dragging LLE. MRI revealed acute ischemic infarcts involving the pons, bilateral middle cerebellar peduncle and left cerebellar hemisphere. PMH includes GERD, depression, anemia, glaucoma, hyperlipidemia, HTN, back surgery, THA.    OT comments  Pt states she hopes to leave today. She is aware that she is unsteady.  Educated to have husband nearby when she walks. Using bil UEs during adls without difficulty.  OT who saw her yesterday gave her [facility] activities.  Follow Up Recommendations  Home health OT;Supervision/Assistance - 24 hour    Equipment Recommendations  3 in 1 bedside comode    Recommendations for Other Services      Precautions / Restrictions Precautions Precautions: Fall Restrictions Weight Bearing Restrictions: No       Mobility Bed Mobility Overal bed mobility: Modified Independent             General bed mobility comments: HOB raised  Transfers     Transfers: Sit to/from Stand Sit to Stand: Min guard         General transfer comment: cues for hand placement    Balance             Standing balance-Leahy Scale: Poor                     ADL       Grooming: Wash/dry face;Oral care;Brushing hair;Standing;Supervision/safety;Set up                   Toilet Transfer: Min guard;Ambulation;BSC;RW       Tub/ Shower Transfer: Walk-in shower;Min guard;3 in 1;Ambulation     General ADL Comments: pt states a 3:1 was ordered.  Husband came in during end of session.  Explained 3:1's use for shower seat and recommended he wipe off legs after use.  Pt able to open containers for grooming and performed tasks standing at sink.  Recommended husband walk with her as she is a little unsteady even using RW      Vision                      Perception     Praxis      Cognition   Behavior During Therapy: WFL for tasks assessed/performed Overall Cognitive Status: Within Functional Limits for tasks assessed                       Extremity/Trunk Assessment               Exercises     Shoulder Instructions       General Comments      Pertinent Vitals/ Pain       Pain Assessment: No/denies pain  Home Living                                          Prior Functioning/Environment              Frequency       Progress Toward Goals  OT Goals(current goals can now be found in the care plan section)  Progress towards OT goals: Progressing toward goals     Plan      Co-evaluation  End of Session     Activity Tolerance Patient tolerated treatment well   Patient Left in chair;with chair alarm set;with family/visitor present   Nurse Communication          Time: 1112-1130 OT Time Calculation (min): 18 min  Charges: OT General Charges $OT Visit: 1 Procedure OT Treatments $Self Care/Home Management : 8-22 mins  Si Jachim 06/13/2015, 11:38 AM  Marica Otter, OTR/L [phone] 06/13/2015

## 2015-06-13 NOTE — Progress Notes (Signed)
Physical Therapy Treatment Patient Details Name: [name] MRN: [id number] DOB: 07/10/1940 Today's Date: 06/13/2015    History of Present Illness Pt admitted for slurred speech and dragging LLE. MRI revealed acute ischemic infarcts involving the pons, bilateral middle cerebellar peduncle and left cerebellar hemisphere. PMH includes GERD, depression, anemia, glaucoma, hyperlipidemia, HTN, back surgery, THA.     PT Comments    Remains unsteady and requires use of RW for safety. Continues to require cues for safe, proper use of RW (especially during transfers). Even with repetition during session, she does not recall technique. Continue to recommend HHPT for continued education and balance training.   Follow Up Recommendations  Home health PT;Supervision for mobility/OOB     Equipment Recommendations  None recommended by PT    Recommendations for Other Services       Precautions / Restrictions Precautions Precautions: Fall Restrictions Weight Bearing Restrictions: No    Mobility  Bed Mobility Overal bed mobility: Modified Independent             General bed mobility comments: supine to sit with raised HOB  Transfers Overall transfer level: Needs assistance Equipment used: Rolling walker (2 wheeled) Transfers: Sit to/from Stand Sit to Stand: Supervision         General transfer comment: repeated various surfaces (with and without armrests) x 5 required cuing for safe use of RW each time (progressed from instructinoal cues to questioning cue)  Ambulation/Gait Ambulation/Gait assistance: Supervision Ambulation Distance (Feet): 130 Feet Assistive device: Rolling walker (2 wheeled) Gait Pattern/deviations: Step-through pattern;Decreased stride length;Trendelenburg   Gait velocity interpretation: Below normal speed for age/gender General Gait Details: slow cadence, prior Lt hip fx with Trendelenburg gait   Stairs            Wheelchair Mobility     Modified Rankin (Stroke Patients Only) Modified Rankin (Stroke Patients Only) Pre-Morbid Rankin Score: No symptoms Modified Rankin: Moderately severe disability     Balance Overall balance assessment: Needs assistance   Sitting balance-Leahy Scale: Normal Sitting balance - Comments: reached to adjust each sock without difficulty   Standing balance support: Bilateral upper extremity supported Standing balance-Leahy Scale: Poor                      Cognition Arousal/Alertness: Awake/alert Behavior During Therapy: WFL for tasks assessed/performed Overall Cognitive Status: Within Functional Limits for tasks assessed                      Exercises      General Comments        Pertinent Vitals/Pain Pain Assessment: No/denies pain    Home Living Family/patient expects to be discharged to:: Private residence Living Arrangements: Spouse/significant other                  Prior Function            PT Goals (current goals can now be found in the care plan section) Acute Rehab PT Goals Patient Stated Goal: to go home and retire Time For Goal Achievement: 06/24/15 Progress towards PT goals: Progressing toward goals    Frequency  Min 3X/week    PT Plan Current plan remains appropriate    Co-evaluation             End of Session Equipment Utilized During Treatment: Gait belt Activity Tolerance: Patient tolerated treatment well Patient left: in chair;with call bell/phone within reach;with chair alarm set     Time: [phone]  PT Time Calculation (min) (ACUTE ONLY): 19 min  Charges:  $Therapeutic Activity: 8-22 mins                    G Codes:      Lawarence Meek 06/13/02/06/174:57 PM Pager [phone]

## 2015-06-13 NOTE — Care Management Important Message (Signed)
Important Message  Patient Details  Name: [name] MRN: [id number] Date of Birth: July 09, 1940   Medicare Important Message Given:  Yes    Renea Schoonmaker P Nancie Bocanegra 06/13/2015, 12:01 PM

## 2015-06-13 NOTE — Progress Notes (Signed)
Pt discharged home with husband. IV removed and telemetry discontinued. Discharge instructions given. Pt questions asked and answered. To leave unit via wheelchair with staff. Lawson Radar

## 2015-06-13 NOTE — Progress Notes (Signed)
   06/13/15 1300  Mechanical VTE Prophylaxis (All Areas)  Mechanical VTE Prophylaxis Other (Comment) (lovenox)  Pressure Ulcer Prevention  Repositioned Sitting  Positioning Frequency Able to turn self  Hygiene  Hygiene Peri care  Level of Assistance Minimal assist  Mobility  Head of Bed Elevated  Self regulated  Activity Chair;Ambulate in room;Bathroom privileges  Level of Assistance Minimal assist, patient does 75% or more  Assistive Device Front wheel walker  Distance Ambulated (ft) 20 ft  Ambulation Response Tolerated well

## 2015-06-13 NOTE — Discharge Summary (Addendum)
Physician Discharge Summary  [name]:[id number] DOB: 12-Mar-1941 DOA: 06/09/2015  PCP: [name], MD  Admit date: 06/09/2015 Discharge date: 08/07/2015  Time spent: > 35 minutes  Recommendations for Outpatient Follow-up:  1. Monitor blood pressures. Due to Carotid stenosis neurology recommending bp goal of 130-150 systolic. Will Discontinue hypertensive medication as a result. Patient has been instructed to check her blood pressures and if blood pressures consistently above 150 then she comes take amlodipine 2. Of note patient had pulmonary nodule on imaging studies and it is requesting patient have follow-up in 3-6 months   Discharge Diagnoses:  Principal Problem:   Cerebral thrombosis with cerebral infarction Active Problems:   HTN (hypertension)   Hyperlipidemia   Stroke (cerebrum) (HCC)   Acute embolic stroke [facility])   Discharge Condition: Stable  Diet recommendation: Heart healthy  Filed Weights   06/11/15 0300  Weight: 75.632 kg (166 lb 11.8 oz)    History of present illness:  75 year old with history of hypertension, chronic LBBB who presented to the hospital complaining of slurred speech and dragging left leg.  Hospital Course:  Acute embolic stroke Neurology consult is recommended the following:  Resultant Improving left weakness and dysarthria  MRI Multi acute infarcts involving the pons, bilateral middle cerebellar peduncle is, and left cerebellar hemisphere.  MRA diffuse cerebrovascular disease as noted above.  Carotid Doppler refer to CTA of the neck  2D Echo EF 65-70%. No cardiac source of emboli identified.  LDL 155, reports stain intolerance  HgbA1c 5.8  VTE prophylaxis - Lovenox  Diet Heart Room service appropriate?: Yes; Fluid consistency:: Thin  aspirin 325 mg daily prior to admission, now on aspirin 325 mg daily and clopidogrel 75 mg daily for 3 months and then Plavix alone.  Patient counseled to be compliant with her  antithrombotic medications  Ongoing aggressive stroke risk factor management  Therapy recommendations: Home health  Hypertension  Stable  Permissive hypertension (OK if < 220/120) but gradually normalize in 5-7 days. Eventual BP goal 130-150 due to right ICA high grade stenosis  Hyperlipidemia  Home meds: No lipid lowering medications prior to admission.  LDL 155, goal < 70  The patient is intolerant to statins  Will try Niacin 250 mg QHS - give ASA 325 mg 30 minutes before niacin to prevent flushing.  Other Stroke Risk Factors  Advanced age   Other Active Problems  Pulmonary nodule - F/U in 3 - 6 months.   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 Aggressive risk factor modification. Discharge home and follow-up as an outpatient in the stroke clinic in 2 months.  Personally examined patient and images, and have participated in and made any corrections needed to history, physical, neuro exam,assessment and plan as stated above. I have personally obtained the history, evaluated lab date, reviewed imaging studies and agree with radiology interpretations. . Recommend aspirin and Plavix for 3 months followed by Plavix alone. Stroke service will sign off. Kindly follow-up as an outpatient in the stroke clinic in 2 months. Kindly call for questions   Procedures: CTA NECK  06/10/2015 1. 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  2. 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  3. Atheromatous narrowing of approximately 50% at the left carotid bifurcation/proximal left ICA.  No significant stenosis within the right ICA or common carotid arteries.  4. Severe high-grade atheromatous stenosis  at the origin of the external carotid arteries bilaterally.  5. Moderate to severe stenosis at the origin of the dominant right vertebral artery. Vertebral arteries otherwise patent within the neck.  6. 6 mm right upper lobe nodule, indeterminate. Non-contrast chest CT at 3-6 months is recommended. If the nodules are stable at time of repeat CT, then future CT at 18-24 months (from today's scan) is considered optional for low-risk patients, but is recommended for high-risk patients. This recommendation follows the consensus statement: Guidelines for Management of Incidental Pulmonary Nodules Detected on CT Images:From the Fleischner Society 2017; published online before print (10.1148/radiol.4098119147).    CTA HEAD  06/10/2015 1. 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 There is some thready attenuated distal reconstitution, possibly via retrograde flow from the anterior circulation. Superior cerebral arteries attenuated but patent proximally. PCAs patent and supplied mainly via the posterior communicating arteries. PCAs demonstrate multifocal atheromatous irregularity with stenoses bilaterally.  2. Extensive atheromatous disease with severe stenoses involving the dominant right V4 segment, which is nearly occluded prior to the vertebrobasilar junction.  Diminutive left vertebral artery becomes occluded as it courses into the cranial vault.  3. Extensive atheromatous plaque throughout the cavernous/supraclinoid ICAs.  There is moderate to severe narrowing of the supraclinoid ICAs bilaterally.  4. Multi focal atheromatous irregularity with narrowing within the anterior cerebral arteries bilaterally, right worse than left.  5. Short-segment mild to moderate stenosis within the proximal petrous left ICA.     Dg Chest 2 View 06/10/2015  1. Lungs hypoexpanded. Mild bibasilar airspace opacities may reflect atelectasis or possibly mild pneumonia.  2. Mild  cardiomegaly.  3. Moderate to large hiatal hernia, increased in size from 2007.     Ct Head Wo Contrast 06/09/2015  Atrophy and chronic white matter microvascular changes. No acute process by noncontrast CT.     Mr Brain Wo Contrast 06/09/2015  1. Multi focal patchy acute ischemic infarcts involving the pons, bilateral middle cerebellar peduncle is, and left cerebellar hemisphere. No associated hemorrhage or mass effect.  2. Abnormal flow voids within the vertebrobasilar system, worrisome for heavy atheromatous disease, slow flow, or possibly even occlusion. Further evaluation with dedicated CTA recommended. (CTA 06/10/15) - see above 3. Generalized cerebral atrophy with moderate chronic small vessel ischemic disease.    Mr Maxine Glenn Head/brain Wo Cm 06/10/2015  1. Absent flow within the vertebral arteries and basilar artery, better evaluated on prior CTA of the head and neck performed earlier the same day. Superior cerebral arteries not visualized on this exam. Bilateral PCAs supplied via the posterior communicating arteries.  2. Multi focal atheromatous irregularity throughout the cavernous/supraclinoid ICAs with secondary moderate to severe multi focal narrowing, greatest at the supraclinoid segments.  3. Focal mild to moderate focal stenosis within the proximal petrous left ICA.  4. Distal atheromatous irregularity within knee ACAs and MCAs bilaterally.     Consultations:  Neurology  Discharge Exam: Filed Vitals:   06/13/15 1025 06/13/15 1422  BP: 134/74 133/88  Pulse: 72 75  Temp: 98.3 F (36.8 C) 98 F (36.7 C)  Resp: 18 18    General: Pt in nad, alert and awake Cardiovascular: rrr, no rubs Respiratory: no increased wob, no wheezes  Discharge Instructions   Discharge Instructions    Ambulatory referral to Neurology    Complete by:  As directed   Stroke office f/u in 2 months  Call MD for:  difficulty breathing, headache or visual disturbances    Complete  by:  As directed      Call MD for:  temperature >100.4    Complete by:  As directed      Diet - low sodium heart healthy    Complete by:  As directed      Discharge instructions    Complete by:  As directed   Will hold your blood pressure medications as your blood pressure has been at goal (systolic blood pressures 130-150). If blood pressures consistently above 150 may take your amlodipine. Follow up with your primary care physician for further evaluation and recommendations.     Increase activity slowly    Complete by:  As directed           Discharge Medication List as of 06/13/2015  5:45 PM    START taking these medications   Details  niacin 250 MG CR capsule Take 1 capsule (250 mg total) by mouth at bedtime., Starting 06/13/2015, Until Discontinued, Print    clopidogrel (PLAVIX) 75 MG tablet Take 1 tablet (75 mg total) by mouth daily., Starting 06/13/2015, Until Discontinued, Print      CONTINUE these medications which have CHANGED   Details  aspirin 325 MG tablet Take 1 tablet (325 mg total) by mouth daily., Starting 06/13/2015, Until Wed 09/13/15, No Print      CONTINUE these medications which have NOT CHANGED   Details  latanoprost (XALATAN) 0.005 % ophthalmic solution Place 1 drop into both eyes at bedtime., Until Discontinued, Historical Med    meclizine (ANTIVERT) 25 MG tablet Take 12.5-25 mg by mouth 3 (three) times daily., Until Discontinued, Historical Med    Ranitidine HCl (ZANTAC PO) Take 150 mg by mouth daily as needed (for acid reflux or heartburn). , Until Discontinued, Historical Med      STOP taking these medications     amLODipine (NORVASC) 10 MG tablet      furosemide (LASIX) 20 MG tablet      losartan (COZAAR) 100 MG tablet      naproxen sodium (ANAPROX) 220 MG tablet        Allergies  Allergen Reactions  . Codeine     Nausea   . Meclizine Other (See Comments)    Lethargy, extreme sleepiness  . Requip [Ropinirole Hcl] Nausea And Vomiting  .  Statins     Muscle weakness      The results of significant diagnostics from this hospitalization (including imaging, microbiology, ancillary and laboratory) are listed below for reference.    Significant Diagnostic Studies: No results found.  Microbiology: Recent Results (from the past 240 hour(s))  MRSA PCR Screening     Status: None   Collection Time: 07/31/15 11:50 AM  Result Value Ref Range Status   MRSA by PCR NEGATIVE NEGATIVE Final    Comment:        The GeneXpert MRSA Assay (FDA approved for NASAL specimens only), is one component of a comprehensive MRSA colonization surveillance program. It is not intended to diagnose MRSA infection nor to guide or monitor treatment for MRSA infections.      Labs: Basic Metabolic Panel:  Recent Labs Lab 08/01/15 0243 08/03/15 0230  NA 143 144  K 3.7 3.6  CL 113* 114*  CO2 20* 24  GLUCOSE 112* 108*  BUN 40* 12  CREATININE 0.97 0.95  CALCIUM 8.8* 9.2   Liver Function Tests:  Recent Labs Lab 08/01/15 0243 08/03/15 0230  AST  14* 15  ALT 10* 12*  ALKPHOS 68 75  BILITOT 0.3 1.0  PROT 4.6* 4.9*  ALBUMIN 2.5* 2.6*   No results for input(s): LIPASE, AMYLASE in the last 168 hours. No results for input(s): AMMONIA in the last 168 hours. CBC:  Recent Labs Lab 08/04/15 1159 08/05/15 0510 08/05/15 1850 08/06/15 0508 08/06/15 1100  WBC 6.7 5.2 6.0 5.3 5.5  HGB 8.6* 7.9* 8.5* 7.4* 8.8*  HCT 27.0* 24.6* 27.2* 22.9* 28.0*  MCV 90.3 93.2 93.2 92.7 92.4  PLT 235 207 242 197 238   Cardiac Enzymes:  Recent Labs Lab 07/31/15 1734 08/01/15 0243  TROPONINI 0.21* 0.11*   BNP: BNP (last 3 results) No results for input(s): BNP in the last 8760 hours.  ProBNP (last 3 results) No results for input(s): PROBNP in the last 8760 hours.  CBG:  Recent Labs Lab 08/04/15 1028  GLUCAP 116*    Signed:  Penny Pia MD.  [facility] 08/07/2015, 3:38 PM

## 2015-06-14 NOTE — Care Management Note (Signed)
Case Management Note  Patient Details  Name: CEAIRA TAMBURRO MRN: [id number] Date of Birth: [birth year]  Subjective/Objective:                    Action/Plan: 06/14/15 @ 1120am:  Patient discharged late yesterday with orders for HHPT/OT. CM called the patient this am and read over the list of [facility] agencies in [place]. She selected [facility]. Mary with [facility] notified and accepted the referral.   Expected Discharge Date:                  Expected Discharge Plan:  Home w Home Health Services  In-House Referral:     Discharge planning Services  CM Consult  Post Acute Care Choice:  Home Health Choice offered to:  Patient  DME Arranged:    DME Agency:     HH Arranged:  PT, OT HH Agency:  [facility]  Status of Service:  Completed, signed off  Medicare Important Message Given:  Yes Date Medicare IM Given:    Medicare IM give by:    Date Additional Medicare IM Given:    Additional Medicare Important Message give by:     If discussed at Long Length of Stay Meetings, dates discussed:    Additional Comments:  Kermit Balo, RN 06/14/2015, 11:19 AM

## 2015-06-16 ENCOUNTER — Other Ambulatory Visit: Payer: Self-pay | Admitting: *Deleted

## 2015-06-16 DIAGNOSIS — D649 Anemia, unspecified: Secondary | ICD-10-CM | POA: Diagnosis not present

## 2015-06-16 DIAGNOSIS — F329 Major depressive disorder, single episode, unspecified: Secondary | ICD-10-CM | POA: Diagnosis not present

## 2015-06-16 DIAGNOSIS — I639 Cerebral infarction, unspecified: Secondary | ICD-10-CM

## 2015-06-16 DIAGNOSIS — E785 Hyperlipidemia, unspecified: Secondary | ICD-10-CM | POA: Diagnosis not present

## 2015-06-16 DIAGNOSIS — I10 Essential (primary) hypertension: Secondary | ICD-10-CM | POA: Diagnosis not present

## 2015-06-16 DIAGNOSIS — Z7982 Long term (current) use of aspirin: Secondary | ICD-10-CM | POA: Diagnosis not present

## 2015-06-16 DIAGNOSIS — K449 Diaphragmatic hernia without obstruction or gangrene: Secondary | ICD-10-CM | POA: Diagnosis not present

## 2015-06-16 DIAGNOSIS — K219 Gastro-esophageal reflux disease without esophagitis: Secondary | ICD-10-CM | POA: Diagnosis not present

## 2015-06-16 DIAGNOSIS — Z96642 Presence of left artificial hip joint: Secondary | ICD-10-CM | POA: Diagnosis not present

## 2015-06-16 DIAGNOSIS — I69354 Hemiplegia and hemiparesis following cerebral infarction affecting left non-dominant side: Secondary | ICD-10-CM | POA: Diagnosis not present

## 2015-06-16 DIAGNOSIS — I69328 Other speech and language deficits following cerebral infarction: Secondary | ICD-10-CM | POA: Diagnosis not present

## 2015-06-16 DIAGNOSIS — Z7902 Long term (current) use of antithrombotics/antiplatelets: Secondary | ICD-10-CM | POA: Diagnosis not present

## 2015-06-16 NOTE — Patient Outreach (Addendum)
[facility]) Care Management  06/16/2015  [name] 02-04-1941 [id number]  Subjective:  Telephone call to patient's home times 2, spoke with patient, and HIPAA verified.  States she is doing well, a little wobbly, getting better everyday, and still tired.  Patient gave [facility] verbal permission to speak with husband([name]) and daughter [name]) regarding her healthcare needs as needed.  Discussed [facility] EMMI Stroke program, [facility] Care Management services, and patient in agreement to receive services.    Patient states she has not scheduled an appointment with her primary MD or neurologist.    RNCM gave patient office phone number for the neurologist.  RNCM educated patient on the importance of follow up care.    Patient voices understanding and states she will call on 06/19/15 to schedule appointment.  Patient states she has an appointment on 06/21/15 with Dr. Melvyn Neth (oncologist).   Patient states her blood pressure is still high but feels fine.  States her blood pressure was 187/90 this morning and took Amlodipine 10 mg po at approximately 1:00pm.   Patient took blood pressure while on the phone with RNCM and states it is currently 186/102.  RNCM educated patient on the signs/ symptoms of stroke and importance of monitoring blood pressure.   RNCM reviewed discharge summary discharge instructions with patient and patient voiced understanding.   Patient voices understanding and states she will call primary MD's after hours number to report blood pressure, treatment regimen today and seek direction on next steps.   Patient in agreement with referral to [facility] for transition of care due to recent hospitalization,  blood pressure monitoring, and hypertension education.    Patient given RNCM's contact number,  [facility] Main phone number, and 24 hour Nurse Advice line number.   Objective: Per Epic case review: Patient hospitalized 06/09/15 - 3/21/7 with Acute embolic stroke.     Assessment: Received EMMI Stroke red flag referral on 06/16/15.   Referral reason:  Patient answered no to scheduled a follow up appointment.    Date trigger reported:   06/15/15.     Patient will continue to receive [facility] Care Management services.    Plan: RNCM will refer patient [facility] RNCM for transition of care due to recent hospitalization,  blood pressure monitoring, and hypertension education.   Dezirae Service H. Gardiner Barefoot, BSN, CCM [facility] Care Management [facility] Telephonic CM Phone: [phone] Fax: [phone]

## 2015-06-19 ENCOUNTER — Other Ambulatory Visit: Payer: Self-pay | Admitting: *Deleted

## 2015-06-19 ENCOUNTER — Telehealth: Payer: Self-pay | Admitting: Cardiovascular Disease

## 2015-06-19 DIAGNOSIS — I639 Cerebral infarction, unspecified: Secondary | ICD-10-CM | POA: Diagnosis not present

## 2015-06-19 DIAGNOSIS — Z6832 Body mass index (BMI) 32.0-32.9, adult: Secondary | ICD-10-CM | POA: Diagnosis not present

## 2015-06-19 DIAGNOSIS — I447 Left bundle-branch block, unspecified: Secondary | ICD-10-CM | POA: Diagnosis not present

## 2015-06-19 DIAGNOSIS — I6529 Occlusion and stenosis of unspecified carotid artery: Secondary | ICD-10-CM | POA: Diagnosis not present

## 2015-06-19 NOTE — Telephone Encounter (Signed)
New Message  [facility] PT calling for verbal order for continuing care- can leave detailed message on vm- HH Rep stated pt declined PhysTherapy but wanted to continue for the short time. Please call back and discuss.

## 2015-06-19 NOTE — Patient Outreach (Signed)
[facility]) Care Management  06/19/2015  [name] 1940/11/01 [id number]  Transition of care week #1 RNCM placed call to patient as part of the transition of care program, spoke with [name], HIPAA verified, patient gave verbal consent for me to speak with her explained transition of care program.  [name] passed  the phone to her daughter [name] that she has previously given verbal consent for [place] to speak with. [name] discussed that patient had appointment with Dr.Perry on today, and he restarted her amlodipine.  [name] reports that the patient has recently gotten a new blood pressure monitor, by reli on. Patient's blood pressure at home has been running 177/78 158/93, and at MD appointment today 140/80 manual reading. Suggested patient take her monitor the next time she has an appointment to compare readings. Reviewed symptoms of stroke to follow up and seek medical attention urgently. [name]'s states that Dr.Perry;s office was going to call neurologist office to arrange an appointment and call patient.  Patient has been able to get discharge prescriptions filled, reinforced taking medications as prescribed. Patient daughter [name] to assist with getting medication set up in a pill organizer for daily use.  Plan Patient will remain active with transition of care program, home visit scheduled for 4/5 Will send PCP letter of involvement Patient will monitor blood pressure and notify MD of consistent elevations greater than 150/ and symptoms of stroke   [facility] CM Care Plan Problem One        Most Recent Value   Care Plan Problem One  Recent hospital admission for stroke   Role Documenting the Problem One  Care Management Coordinator   Care Plan for Problem One  Active   [facility] Long Term Goal (31-90 days)  Patient will not experience a hospital admission in the next 31 days   [facility] Long Term Goal Start Date  06/19/15   Interventions for Problem One Long Term Goal   Introduce patient to transition of care program and follow up, educated patient and caregiver on the importance of MD follow up, notifying MD of new symptoms sooner to arrange a office visit, taking medications as prescribed, provided my contact infomation    [facility] CM Short Term Goal #1 (0-30 days)  Patient will  report taking  medication as prescribed in the next 30 days   [facility] CM Short Term Goal #1 Start Date  06/19/15   Interventions for Short Term Goal #1  Educated on the importance of taking medication as prescribed, patient's daughter to get pill organizer for patient medication , and remove medications that patient is not currently ordered to take.   [facility] CM Short Term Goal #2 (0-30 days)  Patient will report monitoring and recording blood pressures daily  in the next 30 days, and notify MD of consistent elevations   [facility] CM Short Term Goal #2 Start Date  06/19/15   Interventions for Short Term Goal #2  Educated patient on importance of monitoring her blood pressure and keeping a record, RNCM will compare reading of patient monitor to manual reading at visit in  1 week.    [facility] CM Short Term Goal #3 (0-30 days)  Patient will make follow up appointment with neurologist in the next 14 days   [facility] CM Short Term Goal #3 Start Date  06/19/15   Interventions for Short Tern Goal #3  Educated on importance of follow up with MD's, for adjustment of treatment plan, made sure patient/caregiver had contact number  Egbert Garibaldi, RN, [facility] Care Management [phone]- Mobile [phone]- Toll Free Main Office

## 2015-06-19 NOTE — Telephone Encounter (Signed)
Called HH-Rep to follow-up with patient's PCP Dr. [name]. [name] verbalized understanding.

## 2015-06-20 ENCOUNTER — Encounter: Payer: Self-pay | Admitting: *Deleted

## 2015-06-21 DIAGNOSIS — D509 Iron deficiency anemia, unspecified: Secondary | ICD-10-CM | POA: Diagnosis not present

## 2015-06-22 DIAGNOSIS — K449 Diaphragmatic hernia without obstruction or gangrene: Secondary | ICD-10-CM | POA: Diagnosis not present

## 2015-06-22 DIAGNOSIS — I69328 Other speech and language deficits following cerebral infarction: Secondary | ICD-10-CM | POA: Diagnosis not present

## 2015-06-22 DIAGNOSIS — E785 Hyperlipidemia, unspecified: Secondary | ICD-10-CM | POA: Diagnosis not present

## 2015-06-22 DIAGNOSIS — D649 Anemia, unspecified: Secondary | ICD-10-CM | POA: Diagnosis not present

## 2015-06-22 DIAGNOSIS — I10 Essential (primary) hypertension: Secondary | ICD-10-CM | POA: Diagnosis not present

## 2015-06-22 DIAGNOSIS — I69354 Hemiplegia and hemiparesis following cerebral infarction affecting left non-dominant side: Secondary | ICD-10-CM | POA: Diagnosis not present

## 2015-06-23 DIAGNOSIS — I69328 Other speech and language deficits following cerebral infarction: Secondary | ICD-10-CM | POA: Diagnosis not present

## 2015-06-23 DIAGNOSIS — I69354 Hemiplegia and hemiparesis following cerebral infarction affecting left non-dominant side: Secondary | ICD-10-CM | POA: Diagnosis not present

## 2015-06-23 DIAGNOSIS — I10 Essential (primary) hypertension: Secondary | ICD-10-CM | POA: Diagnosis not present

## 2015-06-23 DIAGNOSIS — D509 Iron deficiency anemia, unspecified: Secondary | ICD-10-CM | POA: Diagnosis not present

## 2015-06-23 DIAGNOSIS — I639 Cerebral infarction, unspecified: Secondary | ICD-10-CM | POA: Diagnosis not present

## 2015-06-23 DIAGNOSIS — D649 Anemia, unspecified: Secondary | ICD-10-CM | POA: Diagnosis not present

## 2015-06-23 DIAGNOSIS — K449 Diaphragmatic hernia without obstruction or gangrene: Secondary | ICD-10-CM | POA: Diagnosis not present

## 2015-06-23 DIAGNOSIS — E785 Hyperlipidemia, unspecified: Secondary | ICD-10-CM | POA: Diagnosis not present

## 2015-06-26 DIAGNOSIS — I69328 Other speech and language deficits following cerebral infarction: Secondary | ICD-10-CM | POA: Diagnosis not present

## 2015-06-26 DIAGNOSIS — K449 Diaphragmatic hernia without obstruction or gangrene: Secondary | ICD-10-CM | POA: Diagnosis not present

## 2015-06-26 DIAGNOSIS — E785 Hyperlipidemia, unspecified: Secondary | ICD-10-CM | POA: Diagnosis not present

## 2015-06-26 DIAGNOSIS — I10 Essential (primary) hypertension: Secondary | ICD-10-CM | POA: Diagnosis not present

## 2015-06-26 DIAGNOSIS — D649 Anemia, unspecified: Secondary | ICD-10-CM | POA: Diagnosis not present

## 2015-06-26 DIAGNOSIS — I69354 Hemiplegia and hemiparesis following cerebral infarction affecting left non-dominant side: Secondary | ICD-10-CM | POA: Diagnosis not present

## 2015-06-27 ENCOUNTER — Encounter: Payer: Self-pay | Admitting: *Deleted

## 2015-06-27 ENCOUNTER — Other Ambulatory Visit: Payer: Self-pay | Admitting: *Deleted

## 2015-06-27 NOTE — Patient Outreach (Signed)
[name] [facility]) Care Management   06/27/2015  [name] 1940-04-25 [id number]  [name] is an 75 y.o. female  Subjective: " I think I am doing pretty good, my balance gets a little off at times."  Objective:  BP 162/84 mmHg  Pulse 75  Resp 20  SpO2 94% Review of Systems  Constitutional: Negative.   HENT: Negative.   Eyes: Negative.   Respiratory: Negative.   Cardiovascular: Negative.   Gastrointestinal: Negative.   Genitourinary: Negative.   Musculoskeletal: Negative.  Negative for falls.  Skin: Negative.   Neurological: Positive for dizziness.  Psychiatric/Behavioral: Negative.     Physical Exam  Constitutional: She is oriented to person, place, and time. She appears well-developed and well-nourished.  Cardiovascular: Normal rate and normal heart sounds.   Respiratory: Effort normal and breath sounds normal.  GI: Soft.  Neurological: She is alert and oriented to person, place, and time.  Skin: Skin is warm and dry.  Psychiatric: She has a normal mood and affect. Her behavior is normal. Judgment and thought content normal.    Encounter Medications:   Outpatient Encounter Prescriptions as of 06/27/2015  Medication Sig  . amLODipine (NORVASC) 10 MG tablet Take 10 mg by mouth daily.  [name] aspirin 325 MG tablet Take 1 tablet (325 mg total) by mouth daily.  . clopidogrel (PLAVIX) 75 MG tablet Take 1 tablet (75 mg total) by mouth daily.  [name] escitalopram (LEXAPRO) 10 MG tablet Take 10 mg by mouth daily.  [name] latanoprost (XALATAN) 0.005 % ophthalmic solution Place 1 drop into both eyes at bedtime.  . niacin 250 MG CR capsule Take 1 capsule (250 mg total) by mouth at bedtime.  . Ranitidine HCl (ZANTAC PO) Take 150 mg by mouth daily as needed (for acid reflux or heartburn).   . meclizine (ANTIVERT) 25 MG tablet Take 12.5-25 mg by mouth 3 (three) times daily. Reported on 06/27/2015   No facility-administered encounter medications on file as of 06/27/2015.     Functional Status:   In your present state of health, do you have any difficulty performing the following activities: 06/27/2015 06/13/2015  Hearing? N -  Vision? N -  Difficulty concentrating or making decisions? N -  Walking or climbing stairs? Y -  Dressing or bathing? N -  Doing errands, shopping? N N  Preparing Food and eating ? N -  Using the Toilet? N -  In the past six months, have you accidently leaked urine? Y -  Do you have problems with loss of bowel control? N -  Managing your Medications? N -  Managing your Finances? N -  Housekeeping or managing your Housekeeping? Y -   Fall Risk  06/27/2015  Falls in the past year? Yes  Number falls in past yr: 2 or more  Injury with Fall? Yes  Risk Factor Category  High Fall Risk  Risk for fall due to : History of fall(s);Impaired balance/gait  Risk for fall due to (comments): "weak walking on left side  Follow up Falls prevention discussed;Education provided   Fall/Depression Screening:    PHQ 2/9 Scores 06/27/2015  PHQ - 2 Score 0    Assessment:  Initial home visit, patient daughter [name] present. Home neat , patient met me at the door, not using her cane  Hypertension  Patient reports not adding salt to food, she does occasionally eat out. Educated on foods with lower salt, and seasonings. Patient has automatic blood pressure at home, does not  monitor on a regular basis. Manual blood pressure check 162/84, then checked blood pressure with patient machine 165/85 . Patient's daughter still concerned regarding high blood pressure reading, and will make appointment with cardiologist to discuss need to continue Egan after PCP visit. Patient reports recent blood pressure readings of 190/at oncology appointment, and home reading ranging 150 to 160/. Patient and daughter states that they understand recommendations to keep blood pressure at 150/range per neurology.   Medication adherence Patient is not using pill  organizer at present , states she can keep up with her medication just fine, noted she keeps her medication in a basket on shelf and taking medication as prescribed.  Fall Risk Patient with unsteady gait, noted and patient states left leg weaker, "I drag it at times, patient noted holding on to furniture during visit.  Patient has walker and cane, reminded during visit to use cane to help with balance. Patient denies fall. Fall prevention discussed.   [name] is followed by [facility] home health RN and physical therapy .    Plan:  Patient will remain active with transition of care program Patient will contact MD/911 with stroke symptoms Patient will notify PCP with concerns for elevated blood pressure  THN CM Care Plan Problem One        Most Recent Value   Care Plan Problem One  Recent hospital admission for stroke   Role Documenting the Problem One  Care Management [place] for Problem One  Active   THN Long Term Goal (31-90 days)  Patient will not experience a hospital admission in the next 31 days   THN Long Term Goal Start Date  06/19/15   Interventions for Problem One Long Term Goal  Reinforced with patient importance following MD medication regimen and notifying MD sooner of new symptoms   THN CM Short Term Goal #1 (0-30 days)  Patient will  report taking  medication as prescribed in the next 30 days   THN CM Short Term Goal #1 Start Date  06/19/15   Interventions for Short Term Goal #1   Reinforced Education  on the importance of taking medication, RNCM discussed with patient and daughter contacting MD regarding continued concerns with blood pressure elevations,   THN CM Short Term Goal #2 (0-30 days)  Patient will report monitoring and recording blood pressures daily  in the next 30 days, and notify MD of consistent elevations   THN CM Short Term Goal #2 Start Date  06/19/15   Interventions for Short Term Goal #2  RNCM checked manual blood pressure, in comparison  to patient blood pressure monitor, reading within 10 points, reminded patient to wait 30 minutes after activities to moniitor blood pressures, provided [facility] calendar book and instructed how to record in book.   THN CM Short Term Goal #3 (0-30 days)  Patient will make follow up appointment with neurologist in the next 14 days   THN CM Short Term Goal #3 Start Date  06/19/15   [facility] CM Short Term Goal #3 Met Date  06/28/15 [Patient has scheduled appointment]   THN CM Short Term Goal #4 (0-30 days)  Patient will be able to state at least 3 signs symptoms of stroke in the next 30 days   THN CM Short Term Goal #4 Start Date  06/27/15   Interventions for Short Term Goal #4  Provided patient with Emmi handout and reviewed signs and symptoms of stroke and importance of seeking immediate medical attention  Joylene Draft, RN, [place] Management [phone]- Mobile [phone]- Toll Free Main Office

## 2015-06-28 DIAGNOSIS — I10 Essential (primary) hypertension: Secondary | ICD-10-CM | POA: Diagnosis not present

## 2015-06-28 DIAGNOSIS — I69328 Other speech and language deficits following cerebral infarction: Secondary | ICD-10-CM | POA: Diagnosis not present

## 2015-06-28 DIAGNOSIS — I69354 Hemiplegia and hemiparesis following cerebral infarction affecting left non-dominant side: Secondary | ICD-10-CM | POA: Diagnosis not present

## 2015-06-28 DIAGNOSIS — D649 Anemia, unspecified: Secondary | ICD-10-CM | POA: Diagnosis not present

## 2015-06-28 DIAGNOSIS — E785 Hyperlipidemia, unspecified: Secondary | ICD-10-CM | POA: Diagnosis not present

## 2015-06-28 DIAGNOSIS — K449 Diaphragmatic hernia without obstruction or gangrene: Secondary | ICD-10-CM | POA: Diagnosis not present

## 2015-06-29 DIAGNOSIS — D649 Anemia, unspecified: Secondary | ICD-10-CM | POA: Diagnosis not present

## 2015-06-29 DIAGNOSIS — I69328 Other speech and language deficits following cerebral infarction: Secondary | ICD-10-CM | POA: Diagnosis not present

## 2015-06-29 DIAGNOSIS — I10 Essential (primary) hypertension: Secondary | ICD-10-CM | POA: Diagnosis not present

## 2015-06-29 DIAGNOSIS — I69354 Hemiplegia and hemiparesis following cerebral infarction affecting left non-dominant side: Secondary | ICD-10-CM | POA: Diagnosis not present

## 2015-06-29 DIAGNOSIS — K449 Diaphragmatic hernia without obstruction or gangrene: Secondary | ICD-10-CM | POA: Diagnosis not present

## 2015-06-29 DIAGNOSIS — E785 Hyperlipidemia, unspecified: Secondary | ICD-10-CM | POA: Diagnosis not present

## 2015-07-03 DIAGNOSIS — I10 Essential (primary) hypertension: Secondary | ICD-10-CM | POA: Diagnosis not present

## 2015-07-03 DIAGNOSIS — I69354 Hemiplegia and hemiparesis following cerebral infarction affecting left non-dominant side: Secondary | ICD-10-CM | POA: Diagnosis not present

## 2015-07-03 DIAGNOSIS — I69328 Other speech and language deficits following cerebral infarction: Secondary | ICD-10-CM | POA: Diagnosis not present

## 2015-07-03 DIAGNOSIS — D649 Anemia, unspecified: Secondary | ICD-10-CM | POA: Diagnosis not present

## 2015-07-03 DIAGNOSIS — E785 Hyperlipidemia, unspecified: Secondary | ICD-10-CM | POA: Diagnosis not present

## 2015-07-03 DIAGNOSIS — K449 Diaphragmatic hernia without obstruction or gangrene: Secondary | ICD-10-CM | POA: Diagnosis not present

## 2015-07-05 ENCOUNTER — Other Ambulatory Visit: Payer: Self-pay | Admitting: *Deleted

## 2015-07-05 DIAGNOSIS — E785 Hyperlipidemia, unspecified: Secondary | ICD-10-CM | POA: Diagnosis not present

## 2015-07-05 DIAGNOSIS — K449 Diaphragmatic hernia without obstruction or gangrene: Secondary | ICD-10-CM | POA: Diagnosis not present

## 2015-07-05 DIAGNOSIS — I69328 Other speech and language deficits following cerebral infarction: Secondary | ICD-10-CM | POA: Diagnosis not present

## 2015-07-05 DIAGNOSIS — I69354 Hemiplegia and hemiparesis following cerebral infarction affecting left non-dominant side: Secondary | ICD-10-CM | POA: Diagnosis not present

## 2015-07-05 DIAGNOSIS — I10 Essential (primary) hypertension: Secondary | ICD-10-CM | POA: Diagnosis not present

## 2015-07-05 DIAGNOSIS — D649 Anemia, unspecified: Secondary | ICD-10-CM | POA: Diagnosis not present

## 2015-07-05 NOTE — Patient Outreach (Addendum)
[facility]) Care Management  07/05/2015  [name] 02/08/1941 [id number]   Transition of care week #3   RNCM placed call to patient as part of transition of care program, [name] reports that she is doing well, continues to be able to participate in physical therapy, reports using her walker most of the day at home.  Hypertension [name] reports that she is checking her blood pressure daily, writing it down in book and taking medications as prescribed. Patient reports blood pressure reading has been 150- 160 149/70 to 80. Reinforced with patient the importance of monitoring salt intake, limit fast foods.  Patient denies any shortness of breath or swelling.   Recent Stroke Patient denies having any new stroke symptoms.   Plan Patient will remain active with transition of care program, with call in the next week.  Patient will notify MD of concerns with elevated blood pressure, 911 for stroke symptoms. [facility] CM Care Plan Problem One        Most Recent Value   Care Plan Problem One  Recent hospital admission for stroke   Role Documenting the Problem One  Care Management Coordinator   Care Plan for Problem One  Active   THN Long Term Goal (31-90 days)  Patient will not experience a hospital admission in the next 31 days   THN Long Term Goal Start Date  06/19/15   Interventions for Problem One Long Term Goal  Reviewed  with patient importance following MD medication regimen and notifying MD sooner of new symptoms   THN CM Short Term Goal #1 (0-30 days)  Patient will  report taking  medication as prescribed in the next 30 days   THN CM Short Term Goal #1 Start Date  06/19/15   Interventions for Short Term Goal #1   Reviewed education on importance of taking medication, RNCM discussed with patient and daughter contacting MD regarding continued concerns with blood pressure elevations,   THN CM Short Term Goal #2 (0-30 days)  Patient will report monitoring and  recording blood pressures daily  in the next 30 days, and notify MD of consistent elevations   THN CM Short Term Goal #2 Start Date  06/19/15   [facility] CM Short Term Goal #2 Met Date  07/05/15   THN CM Short Term Goal #3 (0-30 days)  Patient will make follow up appointment with neurologist in the next 14 days   THN CM Short Term Goal #3 Start Date  06/19/15   [facility] CM Short Term Goal #3 Met Date  06/28/15 [Patient has scheduled appointment]   THN CM Short Term Goal #4 (0-30 days)  Patient will be able to state at least 3 signs symptoms of stroke in the next 30 days   THN CM Short Term Goal #4 Start Date  06/27/15   [facility] CM Short Term Goal #4 Met Date  07/05/15   THN CM Short Term Goal #5 (0-30 days)  Patient will be able to report making food choices lower in salt.in the next 30 days   THN CM Short Term Goal #5 Start Date  07/05/15   Interventions for Short Term Goal #5  Educated patient on foods higher in salt to limit,and foods choices lower in salt to choose.      Joylene Draft, RN, [place] Management [phone]- Mobile [phone]- Toll Free Main Office

## 2015-07-07 DIAGNOSIS — I69354 Hemiplegia and hemiparesis following cerebral infarction affecting left non-dominant side: Secondary | ICD-10-CM | POA: Diagnosis not present

## 2015-07-07 DIAGNOSIS — E785 Hyperlipidemia, unspecified: Secondary | ICD-10-CM | POA: Diagnosis not present

## 2015-07-07 DIAGNOSIS — D649 Anemia, unspecified: Secondary | ICD-10-CM | POA: Diagnosis not present

## 2015-07-07 DIAGNOSIS — K449 Diaphragmatic hernia without obstruction or gangrene: Secondary | ICD-10-CM | POA: Diagnosis not present

## 2015-07-07 DIAGNOSIS — I10 Essential (primary) hypertension: Secondary | ICD-10-CM | POA: Diagnosis not present

## 2015-07-07 DIAGNOSIS — I69328 Other speech and language deficits following cerebral infarction: Secondary | ICD-10-CM | POA: Diagnosis not present

## 2015-07-11 DIAGNOSIS — I10 Essential (primary) hypertension: Secondary | ICD-10-CM | POA: Diagnosis not present

## 2015-07-11 DIAGNOSIS — I69354 Hemiplegia and hemiparesis following cerebral infarction affecting left non-dominant side: Secondary | ICD-10-CM | POA: Diagnosis not present

## 2015-07-11 DIAGNOSIS — E785 Hyperlipidemia, unspecified: Secondary | ICD-10-CM | POA: Diagnosis not present

## 2015-07-11 DIAGNOSIS — K449 Diaphragmatic hernia without obstruction or gangrene: Secondary | ICD-10-CM | POA: Diagnosis not present

## 2015-07-11 DIAGNOSIS — I69328 Other speech and language deficits following cerebral infarction: Secondary | ICD-10-CM | POA: Diagnosis not present

## 2015-07-11 DIAGNOSIS — D649 Anemia, unspecified: Secondary | ICD-10-CM | POA: Diagnosis not present

## 2015-07-12 ENCOUNTER — Ambulatory Visit: Payer: Medicare Other | Admitting: *Deleted

## 2015-07-12 ENCOUNTER — Other Ambulatory Visit: Payer: Self-pay

## 2015-07-12 DIAGNOSIS — I69354 Hemiplegia and hemiparesis following cerebral infarction affecting left non-dominant side: Secondary | ICD-10-CM | POA: Diagnosis not present

## 2015-07-12 DIAGNOSIS — I10 Essential (primary) hypertension: Secondary | ICD-10-CM | POA: Diagnosis not present

## 2015-07-12 DIAGNOSIS — I69328 Other speech and language deficits following cerebral infarction: Secondary | ICD-10-CM | POA: Diagnosis not present

## 2015-07-12 DIAGNOSIS — D649 Anemia, unspecified: Secondary | ICD-10-CM | POA: Diagnosis not present

## 2015-07-12 DIAGNOSIS — E785 Hyperlipidemia, unspecified: Secondary | ICD-10-CM | POA: Diagnosis not present

## 2015-07-12 DIAGNOSIS — K449 Diaphragmatic hernia without obstruction or gangrene: Secondary | ICD-10-CM | POA: Diagnosis not present

## 2015-07-12 NOTE — Patient Outreach (Signed)
Transition of care call: Placed call to patient for transition of care. No answer. Left a message requesting a call back.  PLAN: Will await a call back. If no call back then primary nurse case manager will follow up next week as planned.  Rowe Pavy, RN, BSN, CEN [facility] [employer] [phone]

## 2015-07-14 DIAGNOSIS — D649 Anemia, unspecified: Secondary | ICD-10-CM | POA: Diagnosis not present

## 2015-07-14 DIAGNOSIS — I69354 Hemiplegia and hemiparesis following cerebral infarction affecting left non-dominant side: Secondary | ICD-10-CM | POA: Diagnosis not present

## 2015-07-14 DIAGNOSIS — E785 Hyperlipidemia, unspecified: Secondary | ICD-10-CM | POA: Diagnosis not present

## 2015-07-14 DIAGNOSIS — I69328 Other speech and language deficits following cerebral infarction: Secondary | ICD-10-CM | POA: Diagnosis not present

## 2015-07-14 DIAGNOSIS — K449 Diaphragmatic hernia without obstruction or gangrene: Secondary | ICD-10-CM | POA: Diagnosis not present

## 2015-07-14 DIAGNOSIS — I10 Essential (primary) hypertension: Secondary | ICD-10-CM | POA: Diagnosis not present

## 2015-07-17 ENCOUNTER — Other Ambulatory Visit: Payer: Self-pay | Admitting: *Deleted

## 2015-07-17 DIAGNOSIS — I10 Essential (primary) hypertension: Secondary | ICD-10-CM | POA: Diagnosis not present

## 2015-07-17 DIAGNOSIS — I639 Cerebral infarction, unspecified: Secondary | ICD-10-CM | POA: Diagnosis not present

## 2015-07-17 DIAGNOSIS — E785 Hyperlipidemia, unspecified: Secondary | ICD-10-CM | POA: Diagnosis not present

## 2015-07-17 DIAGNOSIS — W57XXXA Bitten or stung by nonvenomous insect and other nonvenomous arthropods, initial encounter: Secondary | ICD-10-CM | POA: Diagnosis not present

## 2015-07-17 DIAGNOSIS — D508 Other iron deficiency anemias: Secondary | ICD-10-CM | POA: Diagnosis not present

## 2015-07-17 NOTE — Patient Outreach (Signed)
[facility]) Care Management  07/17/2015  [name] [birth year] [id number]   Transition of care call   RNCM placed call to patient no answer, left message with return call back  number.  Plan Will await return call, if no response will attempt contact within a  day.   Egbert Garibaldi, RN, [facility] Care Management [phone]- Mobile [phone]- Toll Free Main Office

## 2015-07-18 ENCOUNTER — Other Ambulatory Visit: Payer: Self-pay | Admitting: *Deleted

## 2015-07-18 DIAGNOSIS — D649 Anemia, unspecified: Secondary | ICD-10-CM | POA: Diagnosis not present

## 2015-07-18 DIAGNOSIS — I10 Essential (primary) hypertension: Secondary | ICD-10-CM | POA: Diagnosis not present

## 2015-07-18 DIAGNOSIS — I69328 Other speech and language deficits following cerebral infarction: Secondary | ICD-10-CM | POA: Diagnosis not present

## 2015-07-18 DIAGNOSIS — I69354 Hemiplegia and hemiparesis following cerebral infarction affecting left non-dominant side: Secondary | ICD-10-CM | POA: Diagnosis not present

## 2015-07-18 DIAGNOSIS — E785 Hyperlipidemia, unspecified: Secondary | ICD-10-CM | POA: Diagnosis not present

## 2015-07-18 DIAGNOSIS — K449 Diaphragmatic hernia without obstruction or gangrene: Secondary | ICD-10-CM | POA: Diagnosis not present

## 2015-07-18 NOTE — Patient Outreach (Signed)
[place] [facility]) Care Management  07/18/2015  [name] 1940/05/31 [id number]   Transition of care RNCM placed call to patient, spoke with [name], she reports that she is doing good, except her blood pressure is still high.  [name] reports she had a follow up visit with Dr.Perry on yesterday and he resumed her Losartan, she states that she has started taking the medication along with all of her other medication. [name] states she does not like using the pill organizer because that makes her more confused about what she has taken, she prefers to take pills from the bottle on a daily basis. Patient reports her recent blood pressure was 180/90, she denies andy new symptoms or concerns. Encouraged patient to continue to monitor blood pressures on a daily basis and record and take record with her to PCP appointment and notify MD of continued increases, patient voiced that she is aware to do this and will let the MD know continued elevations.  [name] has follow up appointment with Dr.Sethi on 5/8 and states her daughter will accompany her to that appointment also.  Home health physical therapy, will have their last home visit with patient on 4/28, patient reports that she is doing better, reinforced to use her cane.   Discussed case closure with patient at telephone visit on next week,  no further care management needs identified at this time and patient in agreement.  Plan RNCM will close case at telephone visit on next week Patient will continue to monitor and record her blood pressure daily and notify MD of continued elevations. Patient will continue to watch the salt in her diet.  [facility] CM Care Plan Problem One        Most Recent Value   Care Plan Problem One  Recent hospital admission for stroke   Role Documenting the Problem One  Care Management Coordinator   Care Plan for Problem One  Active   THN Long Term Goal (31-90 days)  Patient will not experience a  hospital admission in the next 31 days   THN Long Term Goal Start Date  06/19/15   Interventions for Problem One Long Term Goal  Encouraged patient in the  importance following MD medication regimen and notifying MD sooner of new symptoms   THN CM Short Term Goal #1 (0-30 days)  Patient will  report taking  medication as prescribed in the next 30 days   THN CM Short Term Goal #1 Start Date  06/19/15   [facility] CM Short Term Goal #1 Met Date  07/18/15   THN CM Short Term Goal #2 (0-30 days)  Patient will report monitoring and recording blood pressures daily  in the next 30 days, and notify MD of consistent elevations   THN CM Short Term Goal #2 Start Date  06/19/15   [facility] CM Short Term Goal #2 Met Date  07/05/15   THN CM Short Term Goal #3 (0-30 days)  Patient will make follow up appointment with neurologist in the next 14 days   THN CM Short Term Goal #3 Start Date  06/19/15   [facility] CM Short Term Goal #3 Met Date  06/28/15 [Patient has scheduled appointment]   THN CM Short Term Goal #4 (0-30 days)  Patient will be able to state at least 3 signs symptoms of stroke in the next 30 days   THN CM Short Term Goal #4 Start Date  06/27/15   [facility] CM Short Term Goal #4 Met Date  07/05/15   THN CM Short Term Goal #5 (0-30 days)  Patient will be able to report making food choices lower in salt.in the next 30 days   THN CM Short Term Goal #5 Start Date  07/05/15   [facility] CM Short Term Goal #5 Met Date  07/18/15     Joylene Draft, RN, [place] Care Management [phone]- Mobile [phone]- [place]

## 2015-07-19 DIAGNOSIS — I10 Essential (primary) hypertension: Secondary | ICD-10-CM | POA: Diagnosis not present

## 2015-07-19 DIAGNOSIS — D649 Anemia, unspecified: Secondary | ICD-10-CM | POA: Diagnosis not present

## 2015-07-19 DIAGNOSIS — E785 Hyperlipidemia, unspecified: Secondary | ICD-10-CM | POA: Diagnosis not present

## 2015-07-19 DIAGNOSIS — I69328 Other speech and language deficits following cerebral infarction: Secondary | ICD-10-CM | POA: Diagnosis not present

## 2015-07-19 DIAGNOSIS — I69354 Hemiplegia and hemiparesis following cerebral infarction affecting left non-dominant side: Secondary | ICD-10-CM | POA: Diagnosis not present

## 2015-07-19 DIAGNOSIS — K449 Diaphragmatic hernia without obstruction or gangrene: Secondary | ICD-10-CM | POA: Diagnosis not present

## 2015-07-21 DIAGNOSIS — K449 Diaphragmatic hernia without obstruction or gangrene: Secondary | ICD-10-CM | POA: Diagnosis not present

## 2015-07-21 DIAGNOSIS — I69354 Hemiplegia and hemiparesis following cerebral infarction affecting left non-dominant side: Secondary | ICD-10-CM | POA: Diagnosis not present

## 2015-07-21 DIAGNOSIS — E785 Hyperlipidemia, unspecified: Secondary | ICD-10-CM | POA: Diagnosis not present

## 2015-07-21 DIAGNOSIS — I10 Essential (primary) hypertension: Secondary | ICD-10-CM | POA: Diagnosis not present

## 2015-07-21 DIAGNOSIS — I69328 Other speech and language deficits following cerebral infarction: Secondary | ICD-10-CM | POA: Diagnosis not present

## 2015-07-21 DIAGNOSIS — D649 Anemia, unspecified: Secondary | ICD-10-CM | POA: Diagnosis not present

## 2015-07-24 ENCOUNTER — Other Ambulatory Visit: Payer: Self-pay | Admitting: *Deleted

## 2015-07-24 NOTE — Patient Outreach (Signed)
[name]) Care Management  07/24/2015  [name] 1940/06/09 [id number]   Transition of care  Call placed to [name] reports that she is doing fine.   Home Health physical therapy completed sessions on 4/28. Patient reports that she is doing well with walking using her cane, denies fall.  Patient denies any new stroke symptoms, patient reports taking her medication as prescribed and checking and recording her blood pressure daily with blood pressure running 150/160 per patient reports. Encouraged patient to take record of blood pressure reading to MD office visit.   Reminded patient of follow up appointment with Dr.Sethi on 5/8, she has transportation and states her daughter normally attends visit with her.  Patient denies any new problems or concerns at this time.Discussed case in the next 2 weeks as care management goals are being met.  Plan Will review patient centered goals Patient will attend Neurology appointment on 5/5. Will place transition of care call in one week will evaluate plan for case closure or if patient will benefit from additional home visit.  [facility] CM Care Plan Problem One        Most Recent Value   Care Plan Problem One  Recent hospital admission for stroke   Role Documenting the Problem One  Care Management Coordinator   Care Plan for Problem One  Active   THN Long Term Goal (31-90 days)  Patient will not experience a hospital admission in the next 31 days   THN Long Term Goal Start Date  06/19/15   [facility] Long Term Goal Met Date  07/24/15   THN CM Short Term Goal #1 (0-30 days)  Patient will  report taking  medication as prescribed in the next 30 days   THN CM Short Term Goal #1 Start Date  06/19/15   [facility] CM Short Term Goal #1 Met Date  07/18/15   THN CM Short Term Goal #2 (0-30 days)  Patient will report monitoring and recording blood pressures daily  in the next 30 days, and notify MD of consistent elevations   THN CM Short Term Goal #2  Start Date  06/19/15   [facility] CM Short Term Goal #2 Met Date  07/05/15   THN CM Short Term Goal #3 (0-30 days)  Patient will make follow up appointment with neurologist in the next 14 days   THN CM Short Term Goal #3 Start Date  06/19/15   [facility] CM Short Term Goal #3 Met Date  06/28/15 [Patient has scheduled appointment]   THN CM Short Term Goal #4 (0-30 days)  Patient will be able to state at least 3 signs symptoms of stroke in the next 30 days   THN CM Short Term Goal #4 Start Date  06/27/15   [facility] CM Short Term Goal #4 Met Date  07/05/15   THN CM Short Term Goal #5 (0-30 days)  Patient will be able to report making food choices lower in salt.in the next 30 days   THN CM Short Term Goal #5 Start Date  07/05/15   [facility] CM Short Term Goal #5 Met Date  07/18/15    Joylene Draft, RN, [place] Care Management [phone]- Mobile [phone]- [place]

## 2015-07-26 DIAGNOSIS — I69354 Hemiplegia and hemiparesis following cerebral infarction affecting left non-dominant side: Secondary | ICD-10-CM | POA: Diagnosis not present

## 2015-07-26 DIAGNOSIS — I10 Essential (primary) hypertension: Secondary | ICD-10-CM | POA: Diagnosis not present

## 2015-07-26 DIAGNOSIS — I69328 Other speech and language deficits following cerebral infarction: Secondary | ICD-10-CM | POA: Diagnosis not present

## 2015-07-26 DIAGNOSIS — D649 Anemia, unspecified: Secondary | ICD-10-CM | POA: Diagnosis not present

## 2015-07-26 DIAGNOSIS — E785 Hyperlipidemia, unspecified: Secondary | ICD-10-CM | POA: Diagnosis not present

## 2015-07-26 DIAGNOSIS — K449 Diaphragmatic hernia without obstruction or gangrene: Secondary | ICD-10-CM | POA: Diagnosis not present

## 2015-07-31 ENCOUNTER — Inpatient Hospital Stay (HOSPITAL_COMMUNITY)
Admission: EM | Admit: 2015-07-31 | Discharge: 2015-08-06 | DRG: 378 | Disposition: A | Discharge status: Home / Self Care | Payer: Medicare Other | Attending: Internal Medicine | Admitting: Internal Medicine

## 2015-07-31 ENCOUNTER — Observation Stay (HOSPITAL_COMMUNITY): Payer: Medicare Other | Admitting: Certified Registered Nurse Anesthetist

## 2015-07-31 ENCOUNTER — Encounter (HOSPITAL_COMMUNITY)
Admission: EM | Disposition: A | Discharge status: Home / Self Care | Payer: Self-pay | Source: Home / Self Care | Attending: Internal Medicine

## 2015-07-31 ENCOUNTER — Ambulatory Visit: Payer: Self-pay | Admitting: Neurology

## 2015-07-31 ENCOUNTER — Encounter (HOSPITAL_COMMUNITY): Payer: Self-pay | Admitting: *Deleted

## 2015-07-31 ENCOUNTER — Observation Stay (HOSPITAL_BASED_OUTPATIENT_CLINIC_OR_DEPARTMENT_OTHER): Payer: Medicare Other

## 2015-07-31 DIAGNOSIS — K922 Gastrointestinal hemorrhage, unspecified: Secondary | ICD-10-CM | POA: Diagnosis not present

## 2015-07-31 DIAGNOSIS — Z8673 Personal history of transient ischemic attack (TIA), and cerebral infarction without residual deficits: Secondary | ICD-10-CM

## 2015-07-31 DIAGNOSIS — D62 Acute posthemorrhagic anemia: Secondary | ICD-10-CM | POA: Diagnosis present

## 2015-07-31 DIAGNOSIS — K210 Gastro-esophageal reflux disease with esophagitis: Secondary | ICD-10-CM | POA: Diagnosis present

## 2015-07-31 DIAGNOSIS — I10 Essential (primary) hypertension: Secondary | ICD-10-CM

## 2015-07-31 DIAGNOSIS — R0989 Other specified symptoms and signs involving the circulatory and respiratory systems: Secondary | ICD-10-CM | POA: Diagnosis present

## 2015-07-31 DIAGNOSIS — D500 Iron deficiency anemia secondary to blood loss (chronic): Secondary | ICD-10-CM | POA: Diagnosis present

## 2015-07-31 DIAGNOSIS — K2980 Duodenitis without bleeding: Secondary | ICD-10-CM | POA: Diagnosis not present

## 2015-07-31 DIAGNOSIS — Z8249 Family history of ischemic heart disease and other diseases of the circulatory system: Secondary | ICD-10-CM

## 2015-07-31 DIAGNOSIS — K254 Chronic or unspecified gastric ulcer with hemorrhage: Secondary | ICD-10-CM

## 2015-07-31 DIAGNOSIS — E785 Hyperlipidemia, unspecified: Secondary | ICD-10-CM | POA: Diagnosis present

## 2015-07-31 DIAGNOSIS — I447 Left bundle-branch block, unspecified: Secondary | ICD-10-CM

## 2015-07-31 DIAGNOSIS — Z888 Allergy status to other drugs, medicaments and biological substances status: Secondary | ICD-10-CM

## 2015-07-31 DIAGNOSIS — K209 Esophagitis, unspecified: Secondary | ICD-10-CM | POA: Diagnosis not present

## 2015-07-31 DIAGNOSIS — H409 Unspecified glaucoma: Secondary | ICD-10-CM | POA: Diagnosis present

## 2015-07-31 DIAGNOSIS — R404 Transient alteration of awareness: Secondary | ICD-10-CM | POA: Diagnosis not present

## 2015-07-31 DIAGNOSIS — N183 Chronic kidney disease, stage 3 unspecified: Secondary | ICD-10-CM

## 2015-07-31 DIAGNOSIS — Z91128 Patient's intentional underdosing of medication regimen for other reason: Secondary | ICD-10-CM

## 2015-07-31 DIAGNOSIS — K920 Hematemesis: Secondary | ICD-10-CM | POA: Diagnosis not present

## 2015-07-31 DIAGNOSIS — I248 Other forms of acute ischemic heart disease: Secondary | ICD-10-CM | POA: Diagnosis present

## 2015-07-31 DIAGNOSIS — Z66 Do not resuscitate: Secondary | ICD-10-CM | POA: Diagnosis present

## 2015-07-31 DIAGNOSIS — R42 Dizziness and giddiness: Secondary | ICD-10-CM | POA: Diagnosis not present

## 2015-07-31 DIAGNOSIS — K2970 Gastritis, unspecified, without bleeding: Secondary | ICD-10-CM | POA: Diagnosis not present

## 2015-07-31 DIAGNOSIS — F329 Major depressive disorder, single episode, unspecified: Secondary | ICD-10-CM | POA: Diagnosis present

## 2015-07-31 DIAGNOSIS — Z885 Allergy status to narcotic agent status: Secondary | ICD-10-CM

## 2015-07-31 DIAGNOSIS — K921 Melena: Secondary | ICD-10-CM | POA: Diagnosis present

## 2015-07-31 DIAGNOSIS — K449 Diaphragmatic hernia without obstruction or gangrene: Secondary | ICD-10-CM | POA: Diagnosis present

## 2015-07-31 DIAGNOSIS — I129 Hypertensive chronic kidney disease with stage 1 through stage 4 chronic kidney disease, or unspecified chronic kidney disease: Secondary | ICD-10-CM | POA: Diagnosis not present

## 2015-07-31 DIAGNOSIS — I739 Peripheral vascular disease, unspecified: Secondary | ICD-10-CM | POA: Diagnosis not present

## 2015-07-31 DIAGNOSIS — Z7902 Long term (current) use of antithrombotics/antiplatelets: Secondary | ICD-10-CM

## 2015-07-31 DIAGNOSIS — K2900 Acute gastritis without bleeding: Secondary | ICD-10-CM | POA: Diagnosis present

## 2015-07-31 DIAGNOSIS — I639 Cerebral infarction, unspecified: Secondary | ICD-10-CM | POA: Diagnosis present

## 2015-07-31 DIAGNOSIS — R7989 Other specified abnormal findings of blood chemistry: Secondary | ICD-10-CM

## 2015-07-31 DIAGNOSIS — Z79899 Other long term (current) drug therapy: Secondary | ICD-10-CM

## 2015-07-31 DIAGNOSIS — Z7982 Long term (current) use of aspirin: Secondary | ICD-10-CM

## 2015-07-31 DIAGNOSIS — N1831 Chronic kidney disease, stage 3a: Secondary | ICD-10-CM | POA: Diagnosis present

## 2015-07-31 DIAGNOSIS — I350 Nonrheumatic aortic (valve) stenosis: Secondary | ICD-10-CM | POA: Diagnosis present

## 2015-07-31 HISTORY — DX: Chronic kidney disease, stage 3 unspecified: N18.30

## 2015-07-31 HISTORY — DX: Cerebral infarction, unspecified: I63.9

## 2015-07-31 HISTORY — DX: Chronic kidney disease, stage 3 (moderate): N18.3

## 2015-07-31 HISTORY — PX: ESOPHAGOGASTRODUODENOSCOPY: SHX5428

## 2015-07-31 LAB — CBC WITH DIFFERENTIAL/PLATELET
Basophils Absolute: 0 10*3/uL (ref 0.0–0.1)
Basophils Relative: 0 %
EOS PCT: 3 %
Eosinophils Absolute: 0.2 10*3/uL (ref 0.0–0.7)
HEMATOCRIT: 29.7 % — AB (ref 36.0–46.0)
HEMOGLOBIN: 9.1 g/dL — AB (ref 12.0–15.0)
LYMPHS ABS: 2.8 10*3/uL (ref 0.7–4.0)
LYMPHS PCT: 33 %
MCH: 28.7 pg (ref 26.0–34.0)
MCHC: 30.6 g/dL (ref 30.0–36.0)
MCV: 93.7 fL (ref 78.0–100.0)
Monocytes Absolute: 0.9 10*3/uL (ref 0.1–1.0)
Monocytes Relative: 11 %
NEUTROS PCT: 53 %
Neutro Abs: 4.6 10*3/uL (ref 1.7–7.7)
Platelets: 327 10*3/uL (ref 150–400)
RBC: 3.17 MIL/uL — AB (ref 3.87–5.11)
RDW: 13.6 % (ref 11.5–15.5)
WBC: 8.5 10*3/uL (ref 4.0–10.5)

## 2015-07-31 LAB — CBC
HCT: 26.7 % — ABNORMAL LOW (ref 36.0–46.0)
HEMATOCRIT: 28.2 % — AB (ref 36.0–46.0)
HEMOGLOBIN: 8.5 g/dL — AB (ref 12.0–15.0)
Hemoglobin: 8.8 g/dL — ABNORMAL LOW (ref 12.0–15.0)
MCH: 29 pg (ref 26.0–34.0)
MCH: 29.5 pg (ref 26.0–34.0)
MCHC: 31.2 g/dL (ref 30.0–36.0)
MCHC: 31.8 g/dL (ref 30.0–36.0)
MCV: 93.1 fL (ref 78.0–100.0)
MCV: 92.7 fL (ref 78.0–100.0)
PLATELETS: 272 10*3/uL (ref 150–400)
Platelets: 256 10*3/uL (ref 150–400)
RBC: 3.03 MIL/uL — ABNORMAL LOW (ref 3.87–5.11)
RBC: 2.88 MIL/uL — AB (ref 3.87–5.11)
RDW: 13.5 % (ref 11.5–15.5)
RDW: 13.5 % (ref 11.5–15.5)
WBC: 9.2 10*3/uL (ref 4.0–10.5)
WBC: 8.2 10*3/uL (ref 4.0–10.5)

## 2015-07-31 LAB — BASIC METABOLIC PANEL
Anion gap: 11 (ref 5–15)
BUN: 53 mg/dL — AB (ref 6–20)
CHLORIDE: 111 mmol/L (ref 101–111)
CO2: 20 mmol/L — AB (ref 22–32)
Calcium: 8.8 mg/dL — ABNORMAL LOW (ref 8.9–10.3)
Creatinine, Ser: 1.23 mg/dL — ABNORMAL HIGH (ref 0.44–1.00)
GFR calc Af Amer: 49 mL/min — ABNORMAL LOW (ref >60)
GFR calc non Af Amer: 42 mL/min — ABNORMAL LOW (ref >60)
GLUCOSE: 176 mg/dL — AB (ref 65–99)
POTASSIUM: 4 mmol/L (ref 3.5–5.1)
Sodium: 142 mmol/L (ref 135–145)

## 2015-07-31 LAB — MRSA PCR SCREENING: MRSA by PCR: NEGATIVE

## 2015-07-31 LAB — ABO/RH: ABO/RH(D): O POS

## 2015-07-31 LAB — PROTIME-INR
INR: 1.22 (ref 0.00–1.49)
Prothrombin Time: 15.6 seconds — ABNORMAL HIGH (ref 11.6–15.2)

## 2015-07-31 LAB — ECHOCARDIOGRAM COMPLETE
HEIGHTINCHES: 60 in
Weight: 2747.81 oz

## 2015-07-31 LAB — TROPONIN I
Troponin I: 0.05 ng/mL — ABNORMAL HIGH (ref <0.031)
Troponin I: 0.24 ng/mL — ABNORMAL HIGH (ref <0.031)
Troponin I: 0.22 ng/mL — ABNORMAL HIGH (ref <0.031)
Troponin I: 0.21 ng/mL — ABNORMAL HIGH (ref <0.031)

## 2015-07-31 LAB — HEPATIC FUNCTION PANEL
ALK PHOS: 79 U/L (ref 38–126)
ALT: 11 U/L — AB (ref 14–54)
AST: 15 U/L (ref 15–41)
Albumin: 2.7 g/dL — ABNORMAL LOW (ref 3.5–5.0)
Total Bilirubin: 0.4 mg/dL (ref 0.3–1.2)
Total Protein: 5.2 g/dL — ABNORMAL LOW (ref 6.5–8.1)

## 2015-07-31 SURGERY — EGD (ESOPHAGOGASTRODUODENOSCOPY)
Anesthesia: Monitor Anesthesia Care

## 2015-07-31 MED ORDER — ONDANSETRON HCL 4 MG/2ML IJ SOLN
4.0000 mg | Freq: Three times a day (TID) | INTRAMUSCULAR | Status: DC | PRN
  Administered 2015-08-01 – 2015-08-02 (×3): 4 mg via INTRAVENOUS
  Filled 2015-07-31 (×3): qty 2

## 2015-07-31 MED ORDER — LATANOPROST 0.005 % OP SOLN
1.0000 [drp] | Freq: Every day | OPHTHALMIC | Status: DC
  Administered 2015-07-31 – 2015-08-04 (×5): 1 [drp] via OPHTHALMIC
  Filled 2015-07-31 (×3): qty 2.5

## 2015-07-31 MED ORDER — PROPOFOL 500 MG/50ML IV EMUL
INTRAVENOUS | Status: DC | PRN
  Administered 2015-07-31: 80 ug/kg/min via INTRAVENOUS

## 2015-07-31 MED ORDER — SODIUM CHLORIDE 0.9 % IV SOLN: INTRAVENOUS | Status: DC

## 2015-07-31 MED ORDER — SODIUM CHLORIDE 0.9 % IV BOLUS (SEPSIS)
500.0000 mL | Freq: Once | INTRAVENOUS | Status: AC
  Administered 2015-07-31: 500 mL via INTRAVENOUS

## 2015-07-31 MED ORDER — HYDRALAZINE HCL 20 MG/ML IJ SOLN
5.0000 mg | INTRAMUSCULAR | Status: DC | PRN
Start: 2015-07-31 — End: 2015-08-04
  Administered 2015-08-01 – 2015-08-03 (×2): 5 mg via INTRAVENOUS
  Filled 2015-07-31 (×2): qty 1

## 2015-07-31 MED ORDER — PROPOFOL 10 MG/ML IV BOLUS
INTRAVENOUS | Status: DC | PRN
  Administered 2015-07-31 (×2): 50 mg via INTRAVENOUS

## 2015-07-31 MED ORDER — SODIUM CHLORIDE 0.9% FLUSH
3.0000 mL | Freq: Two times a day (BID) | INTRAVENOUS | Status: DC
  Administered 2015-07-31 – 2015-08-04 (×9): 3 mL via INTRAVENOUS

## 2015-07-31 MED ORDER — ONDANSETRON HCL 4 MG/2ML IJ SOLN
4.0000 mg | Freq: Once | INTRAMUSCULAR | Status: AC
  Administered 2015-07-31: 4 mg via INTRAVENOUS
  Filled 2015-07-31: qty 2

## 2015-07-31 MED ORDER — PANTOPRAZOLE SODIUM 40 MG PO TBEC
40.0000 mg | DELAYED_RELEASE_TABLET | Freq: Two times a day (BID) | ORAL | Status: DC
  Administered 2015-07-31 – 2015-08-06 (×12): 40 mg via ORAL
  Filled 2015-07-31 (×12): qty 1

## 2015-07-31 MED ORDER — NITROGLYCERIN 0.4 MG SL SUBL: 0.4000 mg | SUBLINGUAL_TABLET | SUBLINGUAL | Status: DC | PRN

## 2015-07-31 MED ORDER — SODIUM CHLORIDE 0.9 % IV SOLN
INTRAVENOUS | Status: DC
  Administered 2015-07-31 – 2015-08-03 (×5): via INTRAVENOUS
  Administered 2015-08-04: 1 mL via INTRAVENOUS

## 2015-07-31 MED ORDER — SODIUM CHLORIDE 0.9 % IV SOLN
80.0000 mg | Freq: Once | INTRAVENOUS | Status: AC
  Administered 2015-07-31: 80 mg via INTRAVENOUS
  Filled 2015-07-31: qty 80

## 2015-07-31 MED ORDER — ACETAMINOPHEN 650 MG RE SUPP: 650.0000 mg | Freq: Four times a day (QID) | RECTAL | Status: DC | PRN

## 2015-07-31 MED ORDER — BUTAMBEN-TETRACAINE-BENZOCAINE 2-2-14 % EX AERO
INHALATION_SPRAY | CUTANEOUS | Status: DC | PRN
  Administered 2015-07-31: 2 via TOPICAL

## 2015-07-31 MED ORDER — PANTOPRAZOLE SODIUM 40 MG IV SOLR
8.0000 mg/h | INTRAVENOUS | Status: DC
  Administered 2015-07-31: 8 mg/h via INTRAVENOUS
  Filled 2015-07-31 (×4): qty 80

## 2015-07-31 MED ORDER — ACETAMINOPHEN 325 MG PO TABS: 650.0000 mg | ORAL_TABLET | Freq: Four times a day (QID) | ORAL | Status: DC | PRN

## 2015-07-31 MED ORDER — PANTOPRAZOLE SODIUM 40 MG IV SOLR: 40.0000 mg | Freq: Two times a day (BID) | INTRAVENOUS | Status: DC

## 2015-07-31 NOTE — ED Provider Notes (Addendum)
CSN: [id number]     Arrival date & time 07/31/15  4098 History  By signing my name below, I, Bethel Born, attest that this documentation has been prepared under the direction and in the presence of Gilda Crease, MD. Electronically Signed: Bethel Born, ED Scribe. 07/31/2015. 3:53 AM   Chief Complaint  Patient presents with  . GI Bleeding    The history is provided by the patient and the EMS personnel. No language interpreter was used.   Brought in by EMS from home, [name] is a 75 y.o. female with PMHx of stroke on Plavix, GERD, HTN, and HLD who presents to the Emergency Department complaining of nausea and dark brown coffee-ground emesis with onset a few hours ago. Pt states that she had a lot of vomiting at home.  Associated symptoms include light headedness with standing. Pt denies abdominal pain. She denies history of abdominal ulcer.    Past Medical History  Diagnosis Date  . GERD (gastroesophageal reflux disease)   . Depression   . Anemia   . Hyperlipidemia   . Glaucoma   . HTN (hypertension) 07/12/2011  . Stroke [facility])    Past Surgical History  Procedure Laterality Date  . Abdominal hysterectomy    . Oophorectomy    . Cataract surgery    . Back surgery    . Ureter surgery    . Total hip arthroplasty     Family History  Problem Relation Age of Onset  . Hypertension Mother   . Hypertension Father   . Heart attack Father   . Hypertension Brother   . Diabetes Brother    Social History  Substance Use Topics  . Smoking status: Never Smoker   . Smokeless tobacco: Never Used  . Alcohol Use: No   OB History    No data available     Review of Systems  Gastrointestinal: Positive for nausea and vomiting. Negative for abdominal pain.  All other systems reviewed and are negative.   Allergies  Codeine; Meclizine; and Statins  Home Medications   Prior to Admission medications   Medication Sig Start Date End Date Taking? Authorizing Provider   amLODipine (NORVASC) 10 MG tablet Take 10 mg by mouth as needed (for blood pressure).    Yes Historical Provider, MD  aspirin 325 MG tablet Take 1 tablet (325 mg total) by mouth daily. 06/13/15 09/13/15 Yes Penny Pia, MD  Cholecalciferol (VITAMIN D) 2000 units CAPS Take 2,000 Units by mouth daily.   Yes Historical Provider, MD  clopidogrel (PLAVIX) 75 MG tablet Take 1 tablet (75 mg total) by mouth daily. 06/13/15  Yes Penny Pia, MD  escitalopram (LEXAPRO) 10 MG tablet Take 10 mg by mouth daily.   Yes Historical Provider, MD  furosemide (LASIX) 20 MG tablet Take 20 mg by mouth as needed for fluid.    Yes Historical Provider, MD  latanoprost (XALATAN) 0.005 % ophthalmic solution Place 1 drop into both eyes at bedtime.   Yes Historical Provider, MD  losartan (COZAAR) 100 MG tablet Take 100 mg by mouth as needed (for blood pressure).    Yes Historical Provider, MD  niacin 250 MG CR capsule Take 1 capsule (250 mg total) by mouth at bedtime. 06/13/15  Yes Penny Pia, MD  ranitidine (ZANTAC) 150 MG tablet Take 150 mg by mouth daily.   Yes Historical Provider, MD   BP 119/60 mmHg  Pulse 74  Temp(Src) 97.6 F (36.4 C) (Oral)  Resp 20  SpO2 96% Physical  Exam  Constitutional: She is oriented to person, place, and time. She appears well-developed and well-nourished. No distress.  HENT:  Head: Normocephalic and atraumatic.  Right Ear: Hearing normal.  Left Ear: Hearing normal.  Nose: Nose normal.  Mouth/Throat: Oropharynx is clear and moist and mucous membranes are normal.  Eyes: Conjunctivae and EOM are normal. Pupils are equal, round, and reactive to light.  Neck: Normal range of motion. Neck supple.  Cardiovascular: Regular rhythm, S1 normal and S2 normal.  Exam reveals no gallop and no friction rub.   Murmur heard.  Systolic murmur is present with a grade of 3/6  Pulmonary/Chest: Effort normal and breath sounds normal. No respiratory distress. She exhibits no tenderness.  Abdominal: Soft.  Normal appearance and bowel sounds are normal. There is no hepatosplenomegaly. There is no tenderness. There is no rebound, no guarding, no tenderness at McBurney's point and negative Murphy's sign. No hernia.  Genitourinary:  No gross blood or melena noted  Musculoskeletal: Normal range of motion.  Neurological: She is alert and oriented to person, place, and time. She has normal strength. No cranial nerve deficit or sensory deficit. Coordination normal. GCS eye subscore is 4. GCS verbal subscore is 5. GCS motor subscore is 6.  Skin: Skin is warm, dry and intact. No rash noted. No cyanosis.  Psychiatric: She has a normal mood and affect. Her speech is normal and behavior is normal. Thought content normal.  Nursing note and vitals reviewed.   ED Course  Procedures (including critical care time)  COORDINATION OF CARE: 3:24 AM Discussed treatment plan which includes lab work, EKG, and Protonix  with pt at bedside and pt agreed to plan.  Labs Review Labs Reviewed  CBC WITH DIFFERENTIAL/PLATELET - Abnormal; Notable for the following:    RBC 3.17 (*)    Hemoglobin 9.1 (*)    HCT 29.7 (*)    All other components within normal limits  BASIC METABOLIC PANEL - Abnormal; Notable for the following:    CO2 20 (*)    Glucose, Bld 176 (*)    BUN 53 (*)    Creatinine, Ser 1.23 (*)    Calcium 8.8 (*)    GFR calc non Af Amer 42 (*)    GFR calc Af Amer 49 (*)    All other components within normal limits  HEPATIC FUNCTION PANEL - Abnormal; Notable for the following:    Total Protein 5.2 (*)    Albumin 2.7 (*)    ALT 11 (*)    Bilirubin, Direct <0.1 (*)    All other components within normal limits  PROTIME-INR - Abnormal; Notable for the following:    Prothrombin Time 15.6 (*)    All other components within normal limits  TROPONIN I - Abnormal; Notable for the following:    Troponin I 0.05 (*)    All other components within normal limits  URINALYSIS, ROUTINE W REFLEX MICROSCOPIC (NOT AT [facility])   TYPE AND SCREEN  ABO/RH    Imaging Review No results found. I have personally reviewed and evaluated these lab results as part of my medical decision-making.   EKG Interpretation   Date/Time:  Monday Jul 31 2015 03:56:49 EDT Ventricular Rate:  80 PR Interval:  167 QRS Duration: 150 QT Interval:  424 QTC Calculation: 489 R Axis:   -3 Text Interpretation:  Sinus rhythm Left bundle branch block No significant  change since last tracing Confirmed by Alphons Burgert  MD, Nimrit Kehres [phone]) on  07/31/2015 4:45:47 AM  MDM   Final diagnoses:  Upper GI bleed   Patient presents to the emergency department by EMS from home. Patient had onset of black and coffee ground emesis while at home. Patient denies abdominal pain. She is not hypotensive at arrival. Patient does have a baseline anemia, normal hemoglobin is unknown. He moved in today is 9.1. This will need to be watched closely, but currently does not require transfusion.  She does have a markedly elevated BUN consistent with upper GI bleed. A rectal exam was performed and stool is brown, no melena.  Patient did have a stroke 2 months ago and was started on aspirin and Plavix. It's unclear if she has been taking ibuprofen at home as well. She does have a history of gastroesophageal reflux disease, no known history of gastric ulcer.  We did attempt to place NG tube to determine if she was actively bleeding, but nurses were unable to place the NG tube.  Discussed with Dr. Ewing Schlein, gastroenterology. Patient with continued Protonix drip and will see patient in consultation. Patient will be admitted to medicine.  I personally performed the services described in this documentation, which was scribed in my presence. The recorded information has been reviewed and is accurate.    Gilda Crease, MD 07/31/15 1610  Gilda Crease, MD 07/31/15 [phone]

## 2015-07-31 NOTE — Care Management Obs Status (Signed)
MEDICARE OBSERVATION STATUS NOTIFICATION   Patient Details  Name: [name] MRN: [id number] Date of Birth: 1940/09/11   Medicare Observation Status Notification Given:  Yes    MayoChapman Fitch, RN 07/31/2015, 3:27 PM

## 2015-07-31 NOTE — Transfer of Care (Signed)
Immediate Anesthesia Transfer of Care Note  Patient: [name]  Procedure(s) Performed: Procedure(s): ESOPHAGOGASTRODUODENOSCOPY (EGD) (N/A)  Patient Location: PACU  Anesthesia Type:General  Level of Consciousness: awake, alert , oriented and patient cooperative  Airway & Oxygen Therapy: Patient Spontanous Breathing and Patient connected to nasal cannula oxygen  Post-op Assessment: Report given to RN and Post -op Vital signs reviewed and stable  Post vital signs: Reviewed and stable  Last Vitals:  Filed Vitals:   07/31/15 1336 07/31/15 1445  BP: 131/57 143/54  Pulse:  83  Temp:    Resp:  19    Last Pain:  Filed Vitals:   07/31/15 1446  PainSc: 0-No pain         Complications: No apparent anesthesia complications[

## 2015-07-31 NOTE — Anesthesia Preprocedure Evaluation (Signed)
Anesthesia Evaluation  Patient identified by MRN, date of birth, ID band Patient awake    Reviewed: Allergy & Precautions, NPO status , Patient's Chart, lab work & pertinent test results  Airway Mallampati: II  TM Distance: >3 FB Neck ROM: Full    Dental   Pulmonary neg pulmonary ROS,    breath sounds clear to auscultation       Cardiovascular hypertension, + Peripheral Vascular Disease  + dysrhythmias  Rhythm:Regular Rate:Normal     Neuro/Psych    GI/Hepatic Neg liver ROS, GERD  ,  Endo/Other    Renal/GU Renal disease     Musculoskeletal   Abdominal   Peds  Hematology History noted. CE   Anesthesia Other Findings   Reproductive/Obstetrics                             Anesthesia Physical Anesthesia Plan  ASA: III  Anesthesia Plan: MAC   Post-op Pain Management:    Induction: Intravenous  Airway Management Planned: Simple Face Mask  Additional Equipment:   Intra-op Plan:   Post-operative Plan:   Informed Consent: I have reviewed the patients History and Physical, chart, labs and discussed the procedure including the risks, benefits and alternatives for the proposed anesthesia with the patient or authorized representative who has indicated his/her understanding and acceptance.     Plan Discussed with: CRNA and Anesthesiologist  Anesthesia Plan Comments:         Anesthesia Quick Evaluation

## 2015-07-31 NOTE — Progress Notes (Signed)
PT Cancellation Note  Patient Details Name: [name] MRN: [id number] DOB: Aug 26, 1940   Cancelled Treatment:    Reason Eval/Treat Not Completed: Medical issues which prohibited therapy.  Patient with elevating Troponin I.   Tenita Cue 07/31/2015, 8:29 AM Pager [phone]

## 2015-07-31 NOTE — Progress Notes (Signed)
[facility] TEAM 1 - Stepdown/ICU TEAM PROGRESS NOTE  STEPHENIE LARABEE HOZ:[id number] DOB: 23-Jan-1941 DOA: 07/31/2015 PCP: [name], MD  Admit HPI / Brief Narrative: 75 y.o. female with history of hypertension, hyperlipidemia, GERD, depression, stroke on Plavix and aspirin, glaucoma, chronic kidney disease-stage III, and aortic stenosis who presented with teh acute onset of coffee-ground emesis.  She felt lightheaded and short of breath.   In the ED hemoglobin dropped from 12.7 on 06/11/15 > 9.1, INR 1.22, troponin 0.05, temperature normal, no tachycardia, stable renal function.    HPI/Subjective: Pt seen for f/u visit.  Assessment/Plan:  Upper GIB / Hematemesis  Acute blood loss anemia   Mildly abnormal troponin  HTN  Hx of CVA  CKD stage III baseline creatinine 1.2 on 06/11/15  Code Status: FULL Family Communication: no family present at time of exam Disposition Plan: SDU  Consultants: [facility]  Procedures: EGD - 5/8 - pending   Antibiotics: none  DVT prophylaxis: SCDs  Objective: Blood pressure 131/57, pulse 83, temperature 98.1 F (36.7 C), temperature source Oral, resp. rate 16, height 5' (1.524 m), weight 77.9 kg (171 lb 11.8 oz), SpO2 96 %.  Intake/Output Summary (Last 24 hours) at 07/31/15 1439 Last data filed at 07/31/15 0814  Gross per 24 hour  Intake    500 ml  Output      0 ml  Net    500 ml     Exam: Pt seen for f/u visit.  Data Reviewed: Basic Metabolic Panel:  Recent Labs Lab 07/31/15 0344  NA 142  K 4.0  CL 111  CO2 20*  GLUCOSE 176*  BUN 53*  CREATININE 1.23*  CALCIUM 8.8*    CBC:  Recent Labs Lab 07/31/15 0344 07/31/15 0610 07/31/15 1145  WBC 8.5 9.2 8.2  NEUTROABS 4.6  --   --   HGB 9.1* 8.8* 8.5*  HCT 29.7* 28.2* 26.7*  MCV 93.7 93.1 92.7  PLT 327 272 256    Liver Function Tests:  Recent Labs Lab 07/31/15 0344  AST 15  ALT 11*  ALKPHOS 79  BILITOT 0.4  PROT 5.2*  ALBUMIN 2.7*    Coags:  Recent Labs Lab 07/31/15 0344  INR 1.22   Cardiac Enzymes:  Recent Labs Lab 07/31/15 0344 07/31/15 0610 07/31/15 1145  TROPONINI 0.05* 0.24* 0.22*    CBG: No results for input(s): GLUCAP in the last 168 hours.  Recent Results (from the past 240 hour(s))  MRSA PCR Screening     Status: None   Collection Time: 07/31/15 11:50 AM  Result Value Ref Range Status   MRSA by PCR NEGATIVE NEGATIVE Final    Comment:        The GeneXpert MRSA Assay (FDA approved for NASAL specimens only), is one component of a comprehensive MRSA colonization surveillance program. It is not intended to diagnose MRSA infection nor to guide or monitor treatment for MRSA infections.      Studies:   Recent x-ray studies have been reviewed in detail by the Attending Physician  Scheduled Meds:  Scheduled Meds: . [MAR Hold] latanoprost  1 drop Both Eyes QHS  . [MAR Hold] pantoprazole (PROTONIX) IV  40 mg Intravenous Q12H  . [MAR Hold] sodium chloride flush  3 mL Intravenous Q12H    Time spent on care of this patient: No charge   Lonia Blood , MD   [facility] Office  [phone] Pager - Text Page per Amion as per below:  On-Call/Text Page:  [email or web address]      password [email or web address]  If 7PM-7AM, please contact night-coverage [email or web address] Password [facility] 07/31/2015, 2:39 PM

## 2015-07-31 NOTE — ED Notes (Signed)
NG tube attempted x 2 without success

## 2015-07-31 NOTE — ED Notes (Signed)
Pt to Ed by [facility] c/o GI bleed. Pt denies abdominal pain. EMS reports large amounts of coffee brown emesis at pt's home. Dark brown vomit noted to gown on arrival. EMS gave NS enroute and 4 mg zofran

## 2015-07-31 NOTE — ED Notes (Signed)
Attempted report 

## 2015-07-31 NOTE — Progress Notes (Signed)
Echocardiogram 2D Echocardiogram has been performed.  [name] 07/31/2015, 1:11 PM

## 2015-07-31 NOTE — H&P (Signed)
History and Physical    [name] QMV:[id number] DOB: [birth year] DOA: 07/31/2015  Referring MD/NP/PA:   PCP: [name], MD   Outpatient Specialists: GI, Dr. Lynita Lombard in [place]  Patient coming from:  Home    Chief Complaint: Coffee-ground emesis  HPI: [name] is a 75 y.o. female with medical history significant of hypertension, hyperlipidemia, GERD, depression, stroke on Plavix and aspirin, glaucoma, chronic kidney disease-stage III, aortic stenosis, who presents with coffee-ground emesis.  Patient reports that she started having nausea and vomiting coffee ground material last night. The vomitus are in dark brown color, and is in large volume. Patient feels lightheaded and has mild shortness of breath. No fever, chills or cough. She does not have chest pain or abdominal pain. She denies symptoms of UTI. Patient reports that she has some mild left-sided weakness from previous stroke which has not changed. No vision change or hearing loss. Pt states that she had EGD many years ago which was negative, and colonoscopy approximately 4-5 years ago which was negative.   ED Course: pt was found to have hemoglobin dropped from 12.7 on 06/11/15-->9.1, INR 1.22, troponin 0.05, temperature normal, no tachycardia, stable renal function, pending urinalysis. Patient is placed on stepdown bed for observation  Review of Systems:   General: no fevers, chills, no changes in body weight, has poor appetite, has fatigue HEENT: no blurry vision, hearing changes or sore throat Pulm: has mild dyspnea, no coughing, wheezing CV: no chest pain, no palpitations Abd: has nausea, vomiting, no abdominal pain, diarrhea, constipation GU: no dysuria, burning on urination, increased urinary frequency, hematuria  Ext: no leg edema Neuro: no unilateral weakness, numbness, or tingling, no vision change or hearing loss Skin: no rash MSK: No muscle spasm, no deformity, no limitation of range of movement in  spin Heme: No easy bruising.  Travel history: No recent long distant travel.  Allergy:  Allergies  Allergen Reactions  . Codeine     Nausea   . Meclizine Other (See Comments)    Lethargy, extreme sleepiness  . Statins     Muscle weakness    Past Medical History  Diagnosis Date  . GERD (gastroesophageal reflux disease)   . Depression   . Anemia   . Hyperlipidemia   . Glaucoma   . HTN (hypertension) 07/12/2011  . Stroke (HCC)   . CKD (chronic kidney disease), stage III     Past Surgical History  Procedure Laterality Date  . Abdominal hysterectomy    . Oophorectomy    . Cataract surgery    . Back surgery    . Ureter surgery    . Total hip arthroplasty      Social History:  reports that she has never smoked. She has never used smokeless tobacco. She reports that she does not drink alcohol or use illicit drugs.  Family History:  Family History  Problem Relation Age of Onset  . Hypertension Mother   . Hypertension Father   . Heart attack Father   . Hypertension Brother   . Diabetes Brother      Prior to Admission medications   Medication Sig Start Date End Date Taking? Authorizing Provider  amLODipine (NORVASC) 10 MG tablet Take 10 mg by mouth as needed (for blood pressure).    Yes Historical Provider, MD  aspirin 325 MG tablet Take 1 tablet (325 mg total) by mouth daily. 06/13/15 09/13/15 Yes Penny Pia, MD  Cholecalciferol (VITAMIN D) 2000 units CAPS Take 2,000 Units by mouth  daily.   Yes Historical Provider, MD  clopidogrel (PLAVIX) 75 MG tablet Take 1 tablet (75 mg total) by mouth daily. 06/13/15  Yes Penny Pia, MD  escitalopram (LEXAPRO) 10 MG tablet Take 10 mg by mouth daily.   Yes Historical Provider, MD  furosemide (LASIX) 20 MG tablet Take 20 mg by mouth as needed for fluid.    Yes Historical Provider, MD  latanoprost (XALATAN) 0.005 % ophthalmic solution Place 1 drop into both eyes at bedtime.   Yes Historical Provider, MD  losartan (COZAAR) 100 MG  tablet Take 100 mg by mouth as needed (for blood pressure).    Yes Historical Provider, MD  niacin 250 MG CR capsule Take 1 capsule (250 mg total) by mouth at bedtime. 06/13/15  Yes Penny Pia, MD  ranitidine (ZANTAC) 150 MG tablet Take 150 mg by mouth daily.   Yes Historical Provider, MD    Physical Exam: Filed Vitals:   07/31/15 0430 07/31/15 0445 07/31/15 0500 07/31/15 0515  BP: 126/51 128/58 116/51 119/60  Pulse: 78 77 77 74  Temp:      TempSrc:      Resp: 15 14 18 20   SpO2: 96% 96% 95% 96%   General: Not in acute distress HEENT:       Eyes: PERRL, EOMI, no scleral icterus.       ENT: No discharge from the ears and nose, no pharynx injection, no tonsillar enlargement.        Neck: No JVD, no bruit, no mass felt. Heme: No neck lymph node enlargement. Cardiac: S1/S2, RRR, 1/6 systolic murmurs, No gallops or rubs. Pulm:  No rales, wheezing, rhonchi or rubs. Abd: Soft, nondistended, nontender, no rebound pain, no organomegaly, BS present. GU: No hematuria Ext: No pitting leg edema bilaterally. 2+DP/PT pulse bilaterally. Musculoskeletal: No joint deformities, No joint redness or warmth, no limitation of ROM in spin. Skin: No rashes.  Neuro: Alert, oriented X3, cranial nerves II-XII grossly intact, minimal left-sided weakness Psych: Patient is not psychotic, no suicidal or hemocidal ideation.  Labs on Admission: I have personally reviewed following labs and imaging studies  CBC:  Recent Labs Lab 07/31/15 0344  WBC 8.5  NEUTROABS 4.6  HGB 9.1*  HCT 29.7*  MCV 93.7  PLT 327   Basic Metabolic Panel:  Recent Labs Lab 07/31/15 0344  NA 142  K 4.0  CL 111  CO2 20*  GLUCOSE 176*  BUN 53*  CREATININE 1.23*  CALCIUM 8.8*   GFR: CrCl cannot be calculated (Unknown ideal weight.). Liver Function Tests:  Recent Labs Lab 07/31/15 0344  AST 15  ALT 11*  ALKPHOS 79  BILITOT 0.4  PROT 5.2*  ALBUMIN 2.7*   No results for input(s): LIPASE, AMYLASE in the last 168  hours. No results for input(s): AMMONIA in the last 168 hours. Coagulation Profile:  Recent Labs Lab 07/31/15 0344  INR 1.22   Cardiac Enzymes:  Recent Labs Lab 07/31/15 0344  TROPONINI 0.05*   BNP (last 3 results) No results for input(s): PROBNP in the last 8760 hours. HbA1C: No results for input(s): HGBA1C in the last 72 hours. CBG: No results for input(s): GLUCAP in the last 168 hours. Lipid Profile: No results for input(s): CHOL, HDL, LDLCALC, TRIG, CHOLHDL, LDLDIRECT in the last 72 hours. Thyroid Function Tests: No results for input(s): TSH, T4TOTAL, FREET4, T3FREE, THYROIDAB in the last 72 hours. Anemia Panel: No results for input(s): VITAMINB12, FOLATE, FERRITIN, TIBC, IRON, RETICCTPCT in the last 72 hours. Urine analysis: No  results found for: COLORURINE, APPEARANCEUR, LABSPEC, PHURINE, GLUCOSEU, HGBUR, BILIRUBINUR, KETONESUR, PROTEINUR, UROBILINOGEN, NITRITE, LEUKOCYTESUR Sepsis Labs: @LABRCNTIP (procalcitonin:4,lacticidven:4) )No results found for this or any previous visit (from the past 240 hour(s)).   Radiological Exams on Admission: No results found.   EKG: Independently reviewed. QTC 489, old left bundle blockage, and old T-wave inversion in lead 1/aVL.  Assessment/Plan Principal Problem:   UGI bleed Active Problems:   LBBB (left bundle branch block)   Carotid bruit   HTN (hypertension)   Stroke (cerebrum) (HCC)   CKD (chronic kidney disease), stage III   Elevated troponin   UGI bleed: Most likely due to use of aspirin plus Plavix. Hemoglobin dropped from 12.7--> 9.1, hemodynamically stable currently. GI, Magod was consulted, will see in AM.  - will admit to SDU - GI consulted by Ed, will follow up recommendations - NPO for possible EGD - IVF: 500 cc of NS, then 100 mL/hr - Start IV pantoprazole gtt - Zofran IV for nausea - Avoid NSAIDs and SQ heparin - Maintain IV access (2 large bore IVs if possible). - Monitor closely and follow q6h cbc,  transfuse as necessary. - LaB: INR, PTT and type screen - Hold ASA and plavix  Elevated troponin: trop 0.05. No CP. Likely due to demanding ischemia.  -have to hold ASA due to GIB -trop x 3 -2d echo. -prn NTG  HTN: -hold oral meds -IV hydralazine when necessary  Hx of Stroke (cerebrum) (HCC): -have to hold ASA and plavix  CKD (chronic kidney disease), stage III: stable. Baseline creatinine 1.2 on 06/11/15. Her creatinine is 1.23. BUN 53, which is likely due to GI blood absorption. -f/u by BMP   DVT ppx: SCD Code Status: DNR Family Communication: Yes, patient's daughter at bed side Disposition Plan:  Anticipate discharge back to previous home environment Consults called: GI, Dr. Ewing Schlein  Admission status:  SDU/obs  Date of Service 07/31/2015    Lorretta Harp [facility] Pager [phone]  If 7PM-7AM, please contact night-coverage [email or web address] Password [email or web address] 07/31/2015, 5:58 AM

## 2015-07-31 NOTE — Interval H&P Note (Signed)
History and Physical Interval Note:  07/31/2015 2:08 PM  [name]  has presented today for surgery, with the diagnosis of hematemasis  The various methods of treatment have been discussed with the patient and family. After consideration of risks, benefits and other options for treatment, the patient has consented to  Procedure(s): ESOPHAGOGASTRODUODENOSCOPY (EGD) (N/A) as a surgical intervention .  The patient's history has been reviewed, patient examined, no change in status, stable for surgery.  I have reviewed the patient's chart and labs.  Questions were answered to the patient's satisfaction.     Racquelle Hyser JR,Rayner Erman L

## 2015-07-31 NOTE — Op Note (Signed)
[facility] Patient Name: [name] Procedure Date : 07/31/2015 MRN: [id number] Attending MD: Tresea Mall , MD Date of Birth: [birth year] CSN: [id number] Age: 75 Admit Type: Inpatient Procedure:                Upper GI endoscopy Indications:              Hematemesis Providers:                Fayrene Fearing L. Randa Evens, MD, Will Bonnet, RN, Darletta Moll, Technician, Merideth Abbey, CRNA Referring MD:              Medicines:                Propofol total dose 150 mg IV, Cetacaine spray Complications:            No immediate complications. Estimated Blood Loss:     Estimated blood loss: none. Procedure:                Pre-Anesthesia Assessment:                           - Prior to the procedure, a History and Physical                            was performed, and patient medications and                            allergies were reviewed. The patient's tolerance of                            previous anesthesia was also reviewed. The risks                            and benefits of the procedure and the sedation                            options and risks were discussed with the patient.                            All questions were answered, and informed consent                            was obtained. Prior Anticoagulants: The patient has                            taken Plavix (clopidogrel), last dose was 1 day                            prior to procedure. After reviewing the risks and                            benefits, the patient was deemed in satisfactory  condition to undergo the procedure.                           - Prior to the procedure, a History and Physical                            was performed, and patient medications and                            allergies were reviewed. The patient's tolerance of                            previous anesthesia was also reviewed. The risks   and benefits of the procedure and the sedation                            options and risks were discussed with the patient.                            All questions were answered, and informed consent                            was obtained. Prior Anticoagulants: The patient has                            taken aspirin, last dose was 1 day prior to                            procedure. ASA Grade Assessment: III - A patient                            with severe systemic disease. After reviewing the                            risks and benefits, the patient was deemed in                            satisfactory condition to undergo the procedure.                           After obtaining informed consent, the endoscope was                            passed under direct vision. Throughout the                            procedure, the patient's blood pressure, pulse, and                            oxygen saturations were monitored continuously. The                            EG-[id number] ([id number]) scope was introduced through the  mouth, and advanced to the second part of duodenum.                            The upper GI endoscopy was accomplished without                            difficulty. The patient tolerated the procedure                            fairly well. Scope In: Scope Out: Findings:      A 10 cm hiatal hernia was present.      Mildly severe esophagitis with no bleeding was found at the       gastroesophageal junction.      Localized mild inflammation was found in the gastric antrum.      The examined duodenum was normal. Impression:               - 10 cm hiatal hernia.                           - Mildly severe reflux esophagitis.                           - Acute gastritis.                           - Normal examined duodenum.                           - No specimens collected. Moderate Sedation:      MAC by anesthesia Recommendation:           -  Return patient to hospital ward for ongoing care.                           - Use Protonix (pantoprazole) 40 mg PO BID.                           - Put patient on a clear liquid diet starting today.                           - Continue present medications. Procedure Code(s):        --- Professional ---                           [phone], Esophagogastroduodenoscopy, flexible,                            transoral; diagnostic, including collection of                            specimen(s) by brushing or washing, when performed                            (separate procedure) Diagnosis Code(s):        --- Professional ---  K21.0, Gastro-esophageal reflux disease with                            esophagitis                           K29.00, Acute gastritis without bleeding                           K92.0, Hematemesis                           K44.9, Diaphragmatic hernia without obstruction or                            gangrene CPT copyright 2016 [employer] Association. All rights reserved. The codes documented in this report are preliminary and upon coder review may  be revised to meet current compliance requirements. Carman Ching, MD Tresea Mall, MD 07/31/2015 2:59:57 PM This report has been signed electronically. Number of Addenda: 0

## 2015-07-31 NOTE — Consult Note (Signed)
[facility] CONSULT Reason for consult: Hematemesis Referring Physician: ER. PCP: Dr. Henrene Pastor. Primary G.I.: Dr. Lyndel Safe in [name] is an 75 y.o. female.  HPI: she has a history of hypertension, Gerd, CKD stage III. She had EGD many years ago and had a colonoscopy about 4 years ago by Dr. Lyndel Safe which had no significant findings. She was recently in the hospital for stroke and was discharged home about 2 months ago. She is on Plavix and aspirin for prophylaxis. She had a fall about 7 to 10 days ago and bruised ribs and has been taking Aleve regularly since that time. She has intermittent heartburn and takes ranitidine on a PRN basis and notes that she has not had any significant heartburn recently. She began to have vague nausea without any dyspepsia or abdominal discomfort about 2 days ago and vomited up some coffee ground material. In the emergency room her hemoglobin had dropped from 12.7to 9.1. For cardiac workup was negative.  Past Medical History  Diagnosis Date  . GERD (gastroesophageal reflux disease)   . Depression   . Anemia   . Hyperlipidemia   . Glaucoma   . HTN (hypertension) 07/12/2011  . Stroke ([place])   . CKD (chronic kidney disease), stage III     Past Surgical History  Procedure Laterality Date  . Abdominal hysterectomy    . Oophorectomy    . Cataract surgery    . Back surgery    . Ureter surgery    . Total hip arthroplasty      Family History  Problem Relation Age of Onset  . Hypertension Mother   . Hypertension Father   . Heart attack Father   . Hypertension Brother   . Diabetes Brother     Social History:  reports that she has never smoked. She has never used smokeless tobacco. She reports that she does not drink alcohol or use illicit drugs.  Allergies:  Allergies  Allergen Reactions  . Codeine     Nausea   . Meclizine Other (See Comments)    Lethargy, extreme sleepiness  . Statins     Muscle weakness    Medications; Prior  to Admission medications   Medication Sig Start Date End Date Taking? Authorizing Provider  amLODipine (NORVASC) 10 MG tablet Take 10 mg by mouth as needed (for blood pressure).    Yes Historical Provider, MD  aspirin 325 MG tablet Take 1 tablet (325 mg total) by mouth daily. 06/13/15 09/13/15 Yes Velvet Bathe, MD  Cholecalciferol (VITAMIN D) 2000 units CAPS Take 2,000 Units by mouth daily.   Yes Historical Provider, MD  clopidogrel (PLAVIX) 75 MG tablet Take 1 tablet (75 mg total) by mouth daily. 06/13/15  Yes Velvet Bathe, MD  escitalopram (LEXAPRO) 10 MG tablet Take 10 mg by mouth daily.   Yes Historical Provider, MD  furosemide (LASIX) 20 MG tablet Take 20 mg by mouth as needed for fluid.    Yes Historical Provider, MD  latanoprost (XALATAN) 0.005 % ophthalmic solution Place 1 drop into both eyes at bedtime.   Yes Historical Provider, MD  losartan (COZAAR) 100 MG tablet Take 100 mg by mouth as needed (for blood pressure).    Yes Historical Provider, MD  niacin 250 MG CR capsule Take 1 capsule (250 mg total) by mouth at bedtime. 06/13/15  Yes Velvet Bathe, MD  ranitidine (ZANTAC) 150 MG tablet Take 150 mg by mouth daily.   Yes Historical Provider, MD   . latanoprost  1 drop Both Eyes QHS  . [START ON 08/03/2015] pantoprazole (PROTONIX) IV  40 mg Intravenous Q12H  . sodium chloride flush  3 mL Intravenous Q12H   PRN Meds acetaminophen **OR** acetaminophen, hydrALAZINE, nitroGLYCERIN, ondansetron (ZOFRAN) IV Results for orders placed or performed during the hospital encounter of 07/31/15 (from the past 48 hour(s))  Type and screen     Status: None   Collection Time: 07/31/15  3:21 AM  Result Value Ref Range   ABO/RH(D) O POS    Antibody Screen NEG    Sample Expiration 08/03/2015   ABO/Rh     Status: None   Collection Time: 07/31/15  3:21 AM  Result Value Ref Range   ABO/RH(D) O POS   CBC with Differential/Platelet     Status: Abnormal   Collection Time: 07/31/15  3:44 AM  Result Value Ref  Range   WBC 8.5 4.0 - 10.5 K/uL   RBC 3.17 (L) 3.87 - 5.11 MIL/uL   Hemoglobin 9.1 (L) 12.0 - 15.0 g/dL   HCT 29.7 (L) 36.0 - 46.0 %   MCV 93.7 78.0 - 100.0 fL   MCH 28.7 26.0 - 34.0 pg   MCHC 30.6 30.0 - 36.0 g/dL   RDW 13.6 11.5 - 15.5 %   Platelets 327 150 - 400 K/uL   Neutrophils Relative % 53 %   Neutro Abs 4.6 1.7 - 7.7 K/uL   Lymphocytes Relative 33 %   Lymphs Abs 2.8 0.7 - 4.0 K/uL   Monocytes Relative 11 %   Monocytes Absolute 0.9 0.1 - 1.0 K/uL   Eosinophils Relative 3 %   Eosinophils Absolute 0.2 0.0 - 0.7 K/uL   Basophils Relative 0 %   Basophils Absolute 0.0 0.0 - 0.1 K/uL  Basic metabolic panel     Status: Abnormal   Collection Time: 07/31/15  3:44 AM  Result Value Ref Range   Sodium 142 135 - 145 mmol/L   Potassium 4.0 3.5 - 5.1 mmol/L   Chloride 111 101 - 111 mmol/L   CO2 20 (L) 22 - 32 mmol/L   Glucose, Bld 176 (H) 65 - 99 mg/dL   BUN 53 (H) 6 - 20 mg/dL   Creatinine, Ser 1.23 (H) 0.44 - 1.00 mg/dL   Calcium 8.8 (L) 8.9 - 10.3 mg/dL   GFR calc non Af Amer 42 (L) >60 mL/min   GFR calc Af Amer 49 (L) >60 mL/min    Comment: (NOTE) The eGFR has been calculated using the CKD EPI equation. This calculation has not been validated in all clinical situations. eGFR's persistently <60 mL/min signify possible Chronic Kidney Disease.    Anion gap 11 5 - 15  Hepatic function panel     Status: Abnormal   Collection Time: 07/31/15  3:44 AM  Result Value Ref Range   Total Protein 5.2 (L) 6.5 - 8.1 g/dL   Albumin 2.7 (L) 3.5 - 5.0 g/dL   AST 15 15 - 41 U/L   ALT 11 (L) 14 - 54 U/L   Alkaline Phosphatase 79 38 - 126 U/L   Total Bilirubin 0.4 0.3 - 1.2 mg/dL   Bilirubin, Direct <0.1 (L) 0.1 - 0.5 mg/dL   Indirect Bilirubin NOT CALCULATED 0.3 - 0.9 mg/dL  Protime-INR     Status: Abnormal   Collection Time: 07/31/15  3:44 AM  Result Value Ref Range   Prothrombin Time 15.6 (H) 11.6 - 15.2 seconds   INR 1.22 0.00 - 1.49  Troponin I  Status: Abnormal   Collection  Time: 07/31/15  3:44 AM  Result Value Ref Range   Troponin I 0.05 (H) <0.031 ng/mL    Comment:        PERSISTENTLY INCREASED TROPONIN VALUES IN THE RANGE OF 0.04-0.49 ng/mL CAN BE SEEN IN:       -UNSTABLE ANGINA       -CONGESTIVE HEART FAILURE       -MYOCARDITIS       -CHEST TRAUMA       -ARRYHTHMIAS       -LATE PRESENTING MYOCARDIAL INFARCTION       -COPD   CLINICAL FOLLOW-UP RECOMMENDED.   CBC     Status: Abnormal   Collection Time: 07/31/15  6:10 AM  Result Value Ref Range   WBC 9.2 4.0 - 10.5 K/uL   RBC 3.03 (L) 3.87 - 5.11 MIL/uL   Hemoglobin 8.8 (L) 12.0 - 15.0 g/dL   HCT 28.2 (L) 36.0 - 46.0 %   MCV 93.1 78.0 - 100.0 fL   MCH 29.0 26.0 - 34.0 pg   MCHC 31.2 30.0 - 36.0 g/dL   RDW 13.5 11.5 - 15.5 %   Platelets 272 150 - 400 K/uL  Troponin I (q 6hr x 3)     Status: Abnormal   Collection Time: 07/31/15  6:10 AM  Result Value Ref Range   Troponin I 0.24 (H) <0.031 ng/mL    Comment:        PERSISTENTLY INCREASED TROPONIN VALUES IN THE RANGE OF 0.04-0.49 ng/mL CAN BE SEEN IN:       -UNSTABLE ANGINA       -CONGESTIVE HEART FAILURE       -MYOCARDITIS       -CHEST TRAUMA       -ARRYHTHMIAS       -LATE PRESENTING MYOCARDIAL INFARCTION       -COPD   CLINICAL FOLLOW-UP RECOMMENDED.     No results found.             Blood pressure 120/65, pulse 77, temperature 98.2 F (36.8 C), temperature source Oral, resp. rate 18, height 5' (1.524 m), weight 77.9 kg (171 lb 11.8 oz), SpO2 99 %.  Physical exam:   General--Pleasant white female no acute distress ENT-- nonicteric mucous membranes very dry Neck-- lymphadenopathy Heart-- regular rate and rhythm without murmurs gallops Lungs-- clear Abdomen-- soft and nontender Psych-- alert and oriented   Assessment: 1. Hematemesis. Patient is on aspirin, Plavix and has been on Aleve for the past week or more. Suspect that she has gastritis or an ulcer. 2. Recent CVA with prophylaxis aspirin and clopidogrel  recommended. She needs to get back on this so I think we do need to determine what the source of her bleeding [place]. 3. Multiple medical problems as noted above  Plan: 1. Will proceed with EGD later today. Hopefully will be able to resume her antiplatelet agents. Have discussed with the patient and family.   Richetta Cubillos JR,Murle Hellstrom L 07/31/2015, 10:08 AM   This note was created using voice recognition software and minor errors may Have occurred unintentionally. Pager: [phone] If no answer or after hours call [phone]

## 2015-08-01 ENCOUNTER — Encounter: Payer: Self-pay | Admitting: Neurology

## 2015-08-01 ENCOUNTER — Encounter (HOSPITAL_COMMUNITY): Payer: Self-pay | Admitting: Gastroenterology

## 2015-08-01 DIAGNOSIS — I6349 Cerebral infarction due to embolism of other cerebral artery: Secondary | ICD-10-CM

## 2015-08-01 DIAGNOSIS — Z7902 Long term (current) use of antithrombotics/antiplatelets: Secondary | ICD-10-CM | POA: Diagnosis not present

## 2015-08-01 DIAGNOSIS — I10 Essential (primary) hypertension: Secondary | ICD-10-CM | POA: Diagnosis present

## 2015-08-01 DIAGNOSIS — K210 Gastro-esophageal reflux disease with esophagitis: Secondary | ICD-10-CM | POA: Diagnosis present

## 2015-08-01 DIAGNOSIS — Z888 Allergy status to other drugs, medicaments and biological substances status: Secondary | ICD-10-CM | POA: Diagnosis not present

## 2015-08-01 DIAGNOSIS — Z66 Do not resuscitate: Secondary | ICD-10-CM | POA: Diagnosis present

## 2015-08-01 DIAGNOSIS — K922 Gastrointestinal hemorrhage, unspecified: Secondary | ICD-10-CM | POA: Diagnosis not present

## 2015-08-01 DIAGNOSIS — D62 Acute posthemorrhagic anemia: Secondary | ICD-10-CM | POA: Diagnosis not present

## 2015-08-01 DIAGNOSIS — H409 Unspecified glaucoma: Secondary | ICD-10-CM | POA: Diagnosis present

## 2015-08-01 DIAGNOSIS — Z885 Allergy status to narcotic agent status: Secondary | ICD-10-CM | POA: Diagnosis not present

## 2015-08-01 DIAGNOSIS — K649 Unspecified hemorrhoids: Secondary | ICD-10-CM | POA: Diagnosis not present

## 2015-08-01 DIAGNOSIS — N183 Chronic kidney disease, stage 3 (moderate): Secondary | ICD-10-CM

## 2015-08-01 DIAGNOSIS — Z79899 Other long term (current) drug therapy: Secondary | ICD-10-CM | POA: Diagnosis not present

## 2015-08-01 DIAGNOSIS — Z7982 Long term (current) use of aspirin: Secondary | ICD-10-CM | POA: Diagnosis not present

## 2015-08-01 DIAGNOSIS — D500 Iron deficiency anemia secondary to blood loss (chronic): Secondary | ICD-10-CM | POA: Diagnosis present

## 2015-08-01 DIAGNOSIS — Z91128 Patient's intentional underdosing of medication regimen for other reason: Secondary | ICD-10-CM | POA: Diagnosis not present

## 2015-08-01 DIAGNOSIS — R7989 Other specified abnormal findings of blood chemistry: Secondary | ICD-10-CM | POA: Diagnosis not present

## 2015-08-01 DIAGNOSIS — I350 Nonrheumatic aortic (valve) stenosis: Secondary | ICD-10-CM | POA: Diagnosis present

## 2015-08-01 DIAGNOSIS — I248 Other forms of acute ischemic heart disease: Secondary | ICD-10-CM | POA: Diagnosis not present

## 2015-08-01 DIAGNOSIS — I447 Left bundle-branch block, unspecified: Secondary | ICD-10-CM | POA: Diagnosis present

## 2015-08-01 DIAGNOSIS — K5790 Diverticulosis of intestine, part unspecified, without perforation or abscess without bleeding: Secondary | ICD-10-CM | POA: Diagnosis not present

## 2015-08-01 DIAGNOSIS — K920 Hematemesis: Secondary | ICD-10-CM | POA: Diagnosis present

## 2015-08-01 DIAGNOSIS — I129 Hypertensive chronic kidney disease with stage 1 through stage 4 chronic kidney disease, or unspecified chronic kidney disease: Secondary | ICD-10-CM | POA: Diagnosis present

## 2015-08-01 DIAGNOSIS — K449 Diaphragmatic hernia without obstruction or gangrene: Secondary | ICD-10-CM

## 2015-08-01 DIAGNOSIS — Z8673 Personal history of transient ischemic attack (TIA), and cerebral infarction without residual deficits: Secondary | ICD-10-CM | POA: Diagnosis not present

## 2015-08-01 DIAGNOSIS — K2900 Acute gastritis without bleeding: Secondary | ICD-10-CM | POA: Diagnosis present

## 2015-08-01 DIAGNOSIS — Z8249 Family history of ischemic heart disease and other diseases of the circulatory system: Secondary | ICD-10-CM | POA: Diagnosis not present

## 2015-08-01 DIAGNOSIS — E785 Hyperlipidemia, unspecified: Secondary | ICD-10-CM | POA: Diagnosis present

## 2015-08-01 DIAGNOSIS — K921 Melena: Secondary | ICD-10-CM | POA: Diagnosis not present

## 2015-08-01 DIAGNOSIS — I639 Cerebral infarction, unspecified: Secondary | ICD-10-CM | POA: Diagnosis present

## 2015-08-01 DIAGNOSIS — I739 Peripheral vascular disease, unspecified: Secondary | ICD-10-CM | POA: Diagnosis not present

## 2015-08-01 DIAGNOSIS — F329 Major depressive disorder, single episode, unspecified: Secondary | ICD-10-CM | POA: Diagnosis present

## 2015-08-01 LAB — LIPID PANEL
CHOL/HDL RATIO: 6.8 ratio
Cholesterol: 156 mg/dL (ref 0–200)
HDL: 23 mg/dL — AB (ref >40)
LDL CALC: 97 mg/dL (ref 0–99)
Triglycerides: 180 mg/dL — ABNORMAL HIGH (ref <150)
VLDL: 36 mg/dL (ref 0–40)

## 2015-08-01 LAB — COMPREHENSIVE METABOLIC PANEL WITH GFR
ALT: 10 U/L — ABNORMAL LOW (ref 14–54)
AST: 14 U/L — ABNORMAL LOW (ref 15–41)
Albumin: 2.5 g/dL — ABNORMAL LOW (ref 3.5–5.0)
Alkaline Phosphatase: 68 U/L (ref 38–126)
Anion gap: 10 (ref 5–15)
BUN: 40 mg/dL — ABNORMAL HIGH (ref 6–20)
CO2: 20 mmol/L — ABNORMAL LOW (ref 22–32)
Calcium: 8.8 mg/dL — ABNORMAL LOW (ref 8.9–10.3)
Chloride: 113 mmol/L — ABNORMAL HIGH (ref 101–111)
Creatinine, Ser: 0.97 mg/dL (ref 0.44–1.00)
GFR calc Af Amer: >60 mL/min
GFR calc non Af Amer: 56 mL/min — ABNORMAL LOW
Glucose, Bld: 112 mg/dL — ABNORMAL HIGH (ref 65–99)
Potassium: 3.7 mmol/L (ref 3.5–5.1)
Sodium: 143 mmol/L (ref 135–145)
Total Bilirubin: 0.3 mg/dL (ref 0.3–1.2)
Total Protein: 4.6 g/dL — ABNORMAL LOW (ref 6.5–8.1)

## 2015-08-01 LAB — CBC
HCT: 23.3 % — ABNORMAL LOW (ref 36.0–46.0)
HEMOGLOBIN: 7.3 g/dL — AB (ref 12.0–15.0)
MCH: 29.1 pg (ref 26.0–34.0)
MCHC: 31.3 g/dL (ref 30.0–36.0)
MCV: 92.8 fL (ref 78.0–100.0)
PLATELETS: 211 10*3/uL (ref 150–400)
RBC: 2.51 MIL/uL — AB (ref 3.87–5.11)
RDW: 13.9 % (ref 11.5–15.5)
WBC: 4.9 10*3/uL (ref 4.0–10.5)

## 2015-08-01 LAB — HEMOGLOBIN AND HEMATOCRIT, BLOOD
HCT: 25.1 % — ABNORMAL LOW (ref 36.0–46.0)
HEMATOCRIT: 23.6 % — AB (ref 36.0–46.0)
HEMOGLOBIN: 7.8 g/dL — AB (ref 12.0–15.0)
HEMOGLOBIN: 7.3 g/dL — AB (ref 12.0–15.0)

## 2015-08-01 LAB — TROPONIN I: Troponin I: 0.11 ng/mL — ABNORMAL HIGH

## 2015-08-01 MED ORDER — LOSARTAN POTASSIUM 50 MG PO TABS
100.0000 mg | ORAL_TABLET | Freq: Every day | ORAL | Status: DC
  Administered 2015-08-02 – 2015-08-06 (×5): 100 mg via ORAL
  Filled 2015-08-01 (×7): qty 2

## 2015-08-01 MED ORDER — POLYETHYLENE GLYCOL 3350 17 G PO PACK
17.0000 g | PACK | Freq: Three times a day (TID) | ORAL | Status: DC
Start: 2015-08-01 — End: 2015-08-02
  Administered 2015-08-01 – 2015-08-02 (×3): 17 g via ORAL
  Filled 2015-08-01 (×3): qty 1

## 2015-08-01 NOTE — Evaluation (Signed)
Occupational Therapy Evaluation Patient Details Name: [name] MRN: [id number] DOB: 1940-11-24 Today's Date: 08/01/2015    History of Present Illness 75 y.o. female with history of hypertension, hyperlipidemia, GERD, depression, stroke on Plavix and aspirin, glaucoma, chronic kidney disease-stage III, and aortic stenosis who presented with teh acute onset of coffee-ground emesis. She felt lightheaded and short of breath.    Clinical Impression   Pt admitted with above. She demonstrates the below listed deficits and will benefit from continued OT to maximize safety and independence with BADLs.  Pt presents to OT with generalized weakness, impaired balance, and decreased activity tolerance.  She currently requires min A for ADLs.  BP with OT was 177/86 - RN notified.   Will follow.       Follow Up Recommendations  Supervision/Assistance - 24 hour    Equipment Recommendations  None recommended by OT    Recommendations for Other Services       Precautions / Restrictions Precautions Precautions: Fall      Mobility Bed Mobility Overal bed mobility: Needs Assistance Bed Mobility: Supine to Sit;Sit to Supine     Supine to sit: Min assist Sit to supine: Min assist   General bed mobility comments: requires cues for technique and assist to lift shoulders from bed   Transfers Overall transfer level: Needs assistance Equipment used: Rolling walker (2 wheeled) Transfers: Sit to/from [employer] Sit to Stand: Min assist Stand pivot transfers: Min assist       General transfer comment: Pt requires min A to steady.  One LOB noted requiring assist to recover     Balance Overall balance assessment: Needs assistance Sitting-balance support: Feet supported Sitting balance-Leahy Scale: Fair     Standing balance support: During functional activity;Single extremity supported Standing balance-Leahy Scale: Poor                              ADL  Overall ADL's : Needs assistance/impaired Eating/Feeding: Independent;Sitting   Grooming: Wash/dry hands;Wash/dry face;Oral care;Brushing hair;Min guard;Standing   Upper Body Bathing: Set up;Sitting   Lower Body Bathing: Sit to/from stand;Minimal assistance   Upper Body Dressing : Set up;Sitting   Lower Body Dressing: Minimal assistance;Sit to/from stand   Toilet Transfer: Ambulation;RW;Grab bars;Minimal assistance Statistician Details (indicate cue type and reason): Pt with one LOB requiring min A to recover  Toileting- Architect and Hygiene: Min guard;Sit to/from stand       Functional mobility during ADLs: Minimal assistance;Rolling walker General ADL Comments: Pt fatigues quickly      Vision     Perception     Praxis      Pertinent Vitals/Pain Pain Assessment: Faces Faces Pain Scale: No hurt     Hand Dominance Right   Extremity/Trunk Assessment Upper Extremity Assessment Upper Extremity Assessment: Generalized weakness   Lower Extremity Assessment Lower Extremity Assessment: Defer to PT evaluation   Cervical / Trunk Assessment Cervical / Trunk Assessment: Normal   Communication Communication Communication: No difficulties   Cognition Arousal/Alertness: Awake/alert Behavior During Therapy: WFL for tasks assessed/performed Overall Cognitive Status: Within Functional Limits for tasks assessed                     General Comments       Exercises       Shoulder Instructions      Home Living Family/patient expects to be discharged to:: Private residence Living Arrangements: Spouse/significant other Available Help  at Discharge: Family;Available 24 hours/day (daughter can help) Type of Home: House Home Access: Ramped entrance     Home Layout: One level     Bathroom Shower/Tub: Producer, television/film/video: Standard Bathroom Accessibility: Yes   Home Equipment: Environmental consultant - 2 wheels;Cane - single point           Prior Functioning/Environment Level of Independence: Independent with assistive device(s);Needs assistance  Gait / Transfers Assistance Needed: used cane and was not as steady after recent stroke. Daughter states she prob needsto use the RW.      Comments: Pt denies h/o falls     OT Diagnosis: Generalized weakness   OT Problem List: Decreased strength;Decreased activity tolerance;Impaired balance (sitting and/or standing);Decreased safety awareness;Decreased knowledge of use of DME or AE   OT Treatment/Interventions: Self-care/ADL training;DME and/or AE instruction;Therapeutic activities;Patient/family education;Balance training    OT Goals(Current goals can be found in the care plan section) Acute Rehab OT Goals Patient Stated Goal: to go home  OT Goal Formulation: With patient/family Time For Goal Achievement: 08/15/15 Potential to Achieve Goals: Good ADL Goals Pt Will Perform Grooming: with supervision;standing Pt Will Perform Upper Body Bathing: with supervision;standing;sitting Pt Will Perform Lower Body Bathing: with supervision;sit to/from stand Pt Will Perform Upper Body Dressing: with supervision;sitting Pt Will Perform Lower Body Dressing: with supervision;sit to/from stand Pt Will Transfer to Toilet: with supervision;ambulating;regular height toilet;grab bars Pt Will Perform Toileting - Clothing Manipulation and hygiene: with supervision;sit to/from stand  OT Frequency: Min 2X/week   Barriers to D/C:            Co-evaluation              End of Session Equipment Utilized During Treatment: Engineer, water Communication: Mobility status;Other (comment) (elevated BP)  Activity Tolerance: Patient limited by fatigue Patient left: in bed;with call bell/phone within reach;with bed alarm set;with family/visitor present   Time: 5329-9242 OT Time Calculation (min): 23 min Charges:  OT General Charges $OT Visit: 1 Procedure OT Evaluation $OT Eval Moderate  Complexity: 1 Procedure OT Treatments $Self Care/Home Management : 8-22 mins G-Codes:    Walterine Amodei M 13-Aug-2015, 8:06 PM

## 2015-08-01 NOTE — Anesthesia Postprocedure Evaluation (Signed)
Anesthesia Post Note  Patient: [name]  Procedure(s) Performed: Procedure(s) (LRB): ESOPHAGOGASTRODUODENOSCOPY (EGD) (N/A)  Patient location during evaluation: PACU Anesthesia Type: MAC Level of consciousness: awake Pain management: pain level controlled Vital Signs Assessment: post-procedure vital signs reviewed and stable Respiratory status: spontaneous breathing Cardiovascular status: stable Anesthetic complications: no    Last Vitals:  Filed Vitals:   08/01/15 1400 08/01/15 1644  BP: 175/65 165/71  Pulse: 65 72  Temp:  36.9 C  Resp: 20 21    Last Pain:  Filed Vitals:   08/01/15 1644  PainSc: Asleep                 EDWARDS,Sudais Banghart

## 2015-08-01 NOTE — Progress Notes (Signed)
[facility] PROGRESS NOTE Subjective patient without gross bleeding. EGD yesterday showed very large hiatal hernia with esophagitis gastritis without active bleeding. 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 She is currently on Protonix and is not had any neurological symptoms yet.  Objective: Vital signs in last 24 hours: Temp:  [98 F (36.7 C)-98.7 F (37.1 C)] 98 F (36.7 C) (05/09 0915) Pulse Rate:  [65-83] 65 (05/09 0915) Resp:  [13-22] 17 (05/09 0915) BP: (131-176)/(53-67) 171/62 mmHg (05/09 0915) SpO2:  [94 %-99 %] 97 % (05/09 0915) Last BM Date: 07/30/15  Intake/Output from previous day: 05/08 0701 - 05/09 0700 In: 1300 [I.V.:1300] Out: 1400 [Urine:1400] Intake/Output this shift: Total I/O In: 120 [P.O.:120] Out: -   PE: General-- no acute distress - Abdomen-- soft and nontender  Lab Results:  Recent Labs  07/31/15 0344 07/31/15 0610 07/31/15 1145 08/01/15 0243  WBC 8.5 9.2 8.2 4.9  HGB 9.1* 8.8* 8.5* 7.3*  HCT 29.7* 28.2* 26.7* 23.3*  PLT 327 272 256 211   BMET  Recent Labs  07/31/15 0344 08/01/15 0243  NA 142 143  K 4.0 3.7  CL 111 113*  CO2 20* 20*  CREATININE 1.23* 0.97   LFT  Recent Labs  07/31/15 0344 08/01/15 0243  PROT 5.2* 4.6*  AST 15 14*  ALT 11* 10*  ALKPHOS 79 68  BILITOT 0.4 0.3  BILIDIR <0.1*  --   IBILI NOT CALCULATED  --    PT/INR  Recent Labs  07/31/15 0344  LABPROT 15.6*  INR 1.22   PANCREAS No results for input(s): LIPASE in the last 72 hours.       Studies/Results: No results found.  Medications: I have reviewed the patient's current medications.  Assessment/Plan: 1. Hematemesis. She has a massive hiatal hernia 12 cm in size with esophagitis and gastritis. She had been taking Aleve. She has no bleeding clinically at this time but her hemoglobin is continuing to drift down. She has been unable in  the past to take either Nexium or omeprazole. 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 So far she seems to be tolerating Protonix. Hopefully this will not cause neurological symptoms. Her colonoscopies are up-to-date. We will advance diet slowly and give her some Miralax to see if her stools have blood.   Lakota Markgraf JR,Kamilo Och L 08/01/2015, 11:39 AM  This note was created using voice recognition software. Minor errors may Have occurred unintentionally.  Pager: [phone] If no answer or after hours call [phone]

## 2015-08-01 NOTE — Evaluation (Signed)
Physical Therapy Evaluation Patient Details Name: [name] MRN: [id number] DOB: Mar 18, 1941 Today's Date: 08/01/2015   History of Present Illness  75 y.o. female with history of hypertension, hyperlipidemia, GERD, depression, stroke on Plavix and aspirin, glaucoma, chronic kidney disease-stage III, and aortic stenosis who presented with teh acute onset of coffee-ground emesis. She felt lightheaded and short of breath.   Clinical Impression  Pt admitted with above diagnosis. Pt currently with functional limitations due to the deficits listed below (see PT Problem List). Pt should progress well. Husband and daughter can assist pt at home.  Will follow acutely.  Pt will benefit from skilled PT to increase their independence and safety with mobility to allow discharge to the venue listed below.      Follow Up Recommendations Home health PT;Supervision/Assistance - 24 hour    Equipment Recommendations  None recommended by PT    Recommendations for Other Services       Precautions / Restrictions Precautions Precautions: Fall Restrictions Weight Bearing Restrictions: No      Mobility  Bed Mobility Overal bed mobility: Needs Assistance Bed Mobility: Supine to Sit     Supine to sit: Min assist     General bed mobility comments: assist for elevation of trunk and scooting to EOB with pad   Transfers Overall transfer level: Needs assistance Equipment used: Rolling walker (2 wheeled) Transfers: Sit to/from Stand Sit to Stand: Min assist         General transfer comment: steadying assist needed to power up  Ambulation/Gait Ambulation/Gait assistance: Min assist Ambulation Distance (Feet): 30 Feet Assistive device: Rolling walker (2 wheeled) Gait Pattern/deviations: Step-through pattern;Decreased stride length;Trunk flexed;Wide base of support   Gait velocity interpretation: Below normal speed for age/gender General Gait Details: Pt able to ambulate to door and back to  bed.  Needed steadying assist at times with turns and cues to stay cllse to RW.   Stairs            Wheelchair Mobility    Modified Rankin (Stroke Patients Only)       Balance Overall balance assessment: Needs assistance Sitting-balance support: No upper extremity supported;Feet supported Sitting balance-Leahy Scale: Fair     Standing balance support: Bilateral upper extremity supported;During functional activity Standing balance-Leahy Scale: Poor Standing balance comment: relies on RW for balance.                             Pertinent Vitals/Pain Pain Assessment: Faces Faces Pain Scale: Hurts little more Pain Location: abdomen Pain Descriptors / Indicators: Sore Pain Intervention(s): Limited activity within patient's tolerance;Monitored during session;Repositioned  VSS    Home Living Family/patient expects to be discharged to:: Private residence Living Arrangements: Spouse/significant other Available Help at Discharge: Family;Available 24 hours/day (daughter can help) Type of Home: House Home Access: Ramped entrance     Home Layout: One level Home Equipment: Walker - 2 wheels;Cane - single point      Prior Function Level of Independence: Independent with assistive device(s);Needs assistance   Gait / Transfers Assistance Needed: used cane and was not as steady after recent stroke. Daughter states she prob needsto use the RW.      Comments: was working 3 days at [employer]   Dominant Hand: Right    Extremity/Trunk Assessment   Upper Extremity Assessment: Defer to OT evaluation           Lower Extremity Assessment:  Generalized weakness      Cervical / Trunk Assessment: Normal  Communication   Communication: No difficulties  Cognition Arousal/Alertness: Awake/alert Behavior During Therapy: WFL for tasks assessed/performed Overall Cognitive Status: Within Functional Limits for tasks assessed                       General Comments      Exercises        Assessment/Plan    PT Assessment Patient needs continued PT services  PT Diagnosis Generalized weakness   PT Problem List Decreased mobility;Decreased activity tolerance;Decreased balance;Decreased knowledge of use of DME;Decreased safety awareness;Decreased knowledge of precautions;Pain  PT Treatment Interventions DME instruction;Gait training;Functional mobility training;Therapeutic activities;Therapeutic exercise;Balance training;Patient/family education;Stair training   PT Goals (Current goals can be found in the Care Plan section) Acute Rehab PT Goals Patient Stated Goal: to get better PT Goal Formulation: With patient Time For Goal Achievement: 08/15/15 Potential to Achieve Goals: Good    Frequency Min 3X/week   Barriers to discharge        Co-evaluation               End of Session Equipment Utilized During Treatment: Gait belt Activity Tolerance: Patient limited by fatigue Patient left: with call bell/phone within reach;in bed;with family/visitor present Nurse Communication: Mobility status    Functional Assessment Tool Used: clinical judgment Functional Limitation: Mobility: Walking and moving around Mobility: Walking and Moving Around Current Status (X8333): At least 20 percent but less than 40 percent impaired, limited or restricted Mobility: Walking and Moving Around Goal Status [phone]): At least 1 percent but less than 20 percent impaired, limited or restricted    Time: 1030-1055 PT Time Calculation (min) (ACUTE ONLY): 25 min   Charges:   PT Evaluation $PT Eval Moderate Complexity: 1 Procedure PT Treatments $Gait Training: 8-22 mins   PT G Codes:   PT G-Codes **NOT FOR INPATIENT CLASS** Functional Assessment Tool Used: clinical judgment Functional Limitation: Mobility: Walking and moving around Mobility: Walking and Moving Around Current Status (B1660): At least 20 percent but less than 40  percent impaired, limited or restricted Mobility: Walking and Moving Around Goal Status [phone]): At least 1 percent but less than 20 percent impaired, limited or restricted    Berline Lopes 08/01/2015, 12:19 PM Remijio Holleran,PT Acute Rehabilitation [phone] (pager)

## 2015-08-01 NOTE — Progress Notes (Signed)
[facility] TEAM 1 - Stepdown/ICU TEAM Progress Note  HUBERT DERSTINE RUE:[id number] DOB: Apr 16, 1940 DOA: 07/31/2015 PCP: [name], MD  Admit HPI / Brief Narrative: 75 y.o.WF PMHx Depression HTN, HLD, GERD, CVA  on  Plavix and aspirin, glaucoma, CKD stage III, aortic stenosis, large Hiatal Hernia   Who presents with coffee-ground emesis.  Patient reports that she started having nausea and vomiting coffee ground material last night. The vomitus are in dark brown color, and is in large volume. Patient feels lightheaded and has mild shortness of breath. No fever, chills or cough. She does not have chest pain or abdominal pain. She denies symptoms of UTI. Patient reports that she has some mild left-sided weakness from previous stroke which has not changed. No vision change or hearing loss. Pt states that she had EGD many years ago which was negative, and colonoscopy approximately 4-5 years ago which was negative.   ED Course: pt was found to have hemoglobin dropped from 12.7 on 06/11/15-->9.1, INR 1.22, troponin 0.05, temperature normal, no tachycardia, stable renal function, pending urinalysis. Patient is placed on stepdown bed for observation  HPI/Subjective: 5/9 A/O 4, NAD. States when ambulating with physical therapy was dizzy (described as lightheadedness): States has been seeing hematologist at [facility] for IV iron injections for past 2 years secondary to chronic anemia. Last seen in April and per her daughter hemoglobin= 13, did not require IV iron. However patient's BP significantly elevated SBP 190. Started on losartan at that point.  Assessment/Plan:  Upper GIB / Hematemesis/Acute Blood Loss Anemia -H/H QID -Continue normal saline 76ml/hr  -Occult blood 3 -Continue Protonix 40 mg BID -Transfuse for hemoglobin<7  Hiatal Hernia -Chronic patient aware, believes was present on her previous EGD 5+ years ago. -GI no further workup required.  Mildly abnormal troponin -Most  likely secondary to demand ischemia will continue to trend  HTN -Patient with recent CVA (March 2017), maintain SBP 140- 160 -Losartan 100 mg daily -PRN Hydralazine  CVA -Patient with stroke in March 2017. Brain MRI showed Multifocal patchy acute ischemic infarcts in pons, bilateral middle cerebellar peduncle is, and left cerebellar hemisphere - EC aspirin 325 mg daily on hold secondary to GI bleed -Plavix on hold secondary to GI bleed  CKD stage III(Baseline creatinine 1.2 on 06/11/15)   Code Status: FULL Family Communication: Daughter Scientist, research (physical sciences)) and husband present at time of exam Disposition Plan: Per GI    Consultants: Dr.James [facility]    Procedure/Significant Events: 5/8 echocardiogram;LVEF 60% to 65%.-(grade 1 diastolic dysfunction). - Aortic valve: Trileaflet. Sclerosis without stenosis. - Mitral valve: Calcified annulus. Mildly thickened leaflets .  There was trivial regurgitation. - Left atrium: The atrium was normal in size. - Inferior vena cava: The vessel was normal in size. The  respirophasic diameter changes were in the normal range (>= 50%),  consistent with normal central venous pressure. 5/8 EGD;-  10 cm hiatal hernia was present. -Mildly severe esophagitis with no bleeding was found at the gastroesophageal junction.-Localized mild inflammation was found in the gastric antrum.    Culture NA  Antibiotics: NA  DVT prophylaxis: SCD   Devices NA   LINES / TUBES:      Continuous Infusions: . sodium chloride 75 mL/hr at 08/01/15 1524    Objective: VITAL SIGNS: Temp: 98.5 F (36.9 C) (05/09 1644) Temp Source: Oral (05/09 1644) BP: 125/76 mmHg (05/09 1700) Pulse Rate: 73 (05/09 1700) SPO2; FIO2:   Intake/Output Summary (Last 24 hours) at 08/01/15 1904 Last data filed  at 08/01/15 1700  Gross per 24 hour  Intake   1150 ml  Output   1400 ml  Net   -250 ml     Exam: General: A/O 4, NAD, No acute respiratory  distress Eyes: negative scleral hemorrhage, negative icterus ENT: Negative Runny nose, negative gingival bleeding, Neck:  Negative scars, masses, torticollis, lymphadenopathy, JVD Lungs: Clear to auscultation bilaterally without wheezes or crackles Cardiovascular: Regular rate and rhythm without murmur gallop or rub normal S1 and S2 Abdomen:negative abdominal pain, nondistended, positive soft, bowel sounds, no rebound, no ascites, no appreciable mass Extremities:  positive hematoma right knee, and left lateral ankle (secondary to patient being evacuated by ambulance from home), No significant cyanosis, clubbing, or edema bilateral lower extremities Psychiatric:  Negative depression, negative anxiety, negative fatigue, negative mania  Neurologic:  Cranial nerves II through XII intact, tongue/uvula midline, all extremities muscle strength 5/5, sensation intact throughout,  negative dysarthria, negative expressive aphasia, negative receptive aphasia.   Data Reviewed: Basic Metabolic Panel:  Recent Labs Lab 07/31/15 0344 08/01/15 0243  NA 142 143  K 4.0 3.7  CL 111 113*  CO2 20* 20*  GLUCOSE 176* 112*  BUN 53* 40*  CREATININE 1.23* 0.97  CALCIUM 8.8* 8.8*   Liver Function Tests:  Recent Labs Lab 07/31/15 0344 08/01/15 0243  AST 15 14*  ALT 11* 10*  ALKPHOS 79 68  BILITOT 0.4 0.3  PROT 5.2* 4.6*  ALBUMIN 2.7* 2.5*   No results for input(s): LIPASE, AMYLASE in the last 168 hours. No results for input(s): AMMONIA in the last 168 hours. CBC:  Recent Labs Lab 07/31/15 0344 07/31/15 0610 07/31/15 1145 08/01/15 0243  WBC 8.5 9.2 8.2 4.9  NEUTROABS 4.6  --   --   --   HGB 9.1* 8.8* 8.5* 7.3*  HCT 29.7* 28.2* 26.7* 23.3*  MCV 93.7 93.1 92.7 92.8  PLT 327 272 256 211   Cardiac Enzymes:  Recent Labs Lab 07/31/15 0344 07/31/15 0610 07/31/15 1145 07/31/15 1734 08/01/15 0243  TROPONINI 0.05* 0.24* 0.22* 0.21* 0.11*   BNP (last 3 results) No results for input(s):  BNP in the last 8760 hours.  ProBNP (last 3 results) No results for input(s): PROBNP in the last 8760 hours.  CBG: No results for input(s): GLUCAP in the last 168 hours.  Recent Results (from the past 240 hour(s))  MRSA PCR Screening     Status: None   Collection Time: 07/31/15 11:50 AM  Result Value Ref Range Status   MRSA by PCR NEGATIVE NEGATIVE Final    Comment:        The GeneXpert MRSA Assay (FDA approved for NASAL specimens only), is one component of a comprehensive MRSA colonization surveillance program. It is not intended to diagnose MRSA infection nor to guide or monitor treatment for MRSA infections.      Studies:  Recent x-ray studies have been reviewed in detail by the Attending Physician  Scheduled Meds:  Scheduled Meds: . latanoprost  1 drop Both Eyes QHS  . losartan  100 mg Oral Daily  . pantoprazole  40 mg Oral BID  . polyethylene glycol  17 g Oral TID  . sodium chloride flush  3 mL Intravenous Q12H    Time spent on care of this patient: 40 mins   WOODS, Roselind Messier , MD  [facility] Office  [phone] Pager - [phone]  On-Call/Text Page:      [email or web address]      password [email or web address]  If 7PM-7AM, please contact  night-coverage [email or web address] Password [email or web address] 08/01/2015, 7:04 PM   LOS: 0 days   Care during the described time interval was provided by me .  I have reviewed this patient's available data, including medical history, events of note, physical examination, and all test results as part of my evaluation. I have personally reviewed and interpreted all radiology studies.   Carolyne Littles, MD [phone] Pager

## 2015-08-02 LAB — HEMOGLOBIN AND HEMATOCRIT, BLOOD
HCT: 21.7 % — ABNORMAL LOW (ref 36.0–46.0)
HEMATOCRIT: 24 % — AB (ref 36.0–46.0)
HEMOGLOBIN: 7.5 g/dL — AB (ref 12.0–15.0)
Hemoglobin: 6.8 g/dL — CL (ref 12.0–15.0)

## 2015-08-02 LAB — HEMOGLOBIN A1C
Hgb A1c MFr Bld: 5.6 % (ref 4.8–5.6)
MEAN PLASMA GLUCOSE: 114 mg/dL

## 2015-08-02 LAB — PREPARE RBC (CROSSMATCH)

## 2015-08-02 LAB — OCCULT BLOOD X 1 CARD TO LAB, STOOL: FECAL OCCULT BLD: POSITIVE — AB

## 2015-08-02 MED ORDER — PEG 3350-KCL-NA BICARB-NACL 420 G PO SOLR
3000.0000 mL | Freq: Once | ORAL | Status: AC
  Administered 2015-08-02: 3000 mL via ORAL
  Filled 2015-08-02: qty 4000

## 2015-08-02 MED ORDER — AMLODIPINE BESYLATE 10 MG PO TABS: 10.0000 mg | ORAL_TABLET | ORAL | Status: DC | PRN

## 2015-08-02 MED ORDER — PEG 3350-KCL-NA BICARB-NACL 420 G PO SOLR
1000.0000 mL | Freq: Once | ORAL | Status: AC
  Administered 2015-08-03: 1000 mL via ORAL
  Filled 2015-08-02: qty 4000

## 2015-08-02 MED ORDER — HYDRALAZINE HCL 25 MG PO TABS
25.0000 mg | ORAL_TABLET | Freq: Four times a day (QID) | ORAL | Status: DC
  Administered 2015-08-02 – 2015-08-04 (×5): 25 mg via ORAL
  Filled 2015-08-02 (×7): qty 1

## 2015-08-02 MED ORDER — PROCHLORPERAZINE EDISYLATE 5 MG/ML IJ SOLN
5.0000 mg | Freq: Once | INTRAMUSCULAR | Status: DC
  Filled 2015-08-02: qty 1

## 2015-08-02 MED ORDER — ESCITALOPRAM OXALATE 10 MG PO TABS
10.0000 mg | ORAL_TABLET | Freq: Every day | ORAL | Status: DC
  Administered 2015-08-02 – 2015-08-06 (×5): 10 mg via ORAL
  Filled 2015-08-02 (×5): qty 1

## 2015-08-02 MED ORDER — SODIUM CHLORIDE 0.9% FLUSH
10.0000 mL | INTRAVENOUS | Status: DC | PRN
  Administered 2015-08-05 (×3): 10 mL
  Administered 2015-08-06: 20 mL
  Filled 2015-08-02 (×4): qty 40

## 2015-08-02 MED ORDER — SODIUM CHLORIDE 0.9 % IV SOLN
Freq: Once | INTRAVENOUS | Status: AC
  Administered 2015-08-02: 09:00:00 via INTRAVENOUS

## 2015-08-02 MED ORDER — SODIUM CHLORIDE 0.9% FLUSH
10.0000 mL | Freq: Two times a day (BID) | INTRAVENOUS | Status: DC
  Administered 2015-08-02 – 2015-08-04 (×5): 10 mL

## 2015-08-02 MED ORDER — PEG 3350-KCL-NA BICARB-NACL 420 G PO SOLR: 4000.0000 mL | Freq: Once | ORAL | Status: DC

## 2015-08-02 NOTE — Progress Notes (Signed)
IV Team unable to get IV access using [place], asking MD for PICC

## 2015-08-02 NOTE — Progress Notes (Signed)
MDCRITICAL VALUE ALERT  Critical value received:  6.8 hemoglobin   Date of notification:  5/10  Time of notification:  0852 Critical value read back:Yes.    Nurse who received alert:  Ronna Polio  MD notified (1st page):  [place]  Time of first page: 0902  MD notified (2nd page):  Time of second page:  Responding MD:  Sharon Seller  Time MD responded:  [phone]

## 2015-08-02 NOTE — Progress Notes (Addendum)
[facility] PROGRESS NOTE Subjective patient without further gross bleeding. Her hemoglobin continues to drift down. Having issues with IV access. Hemoglobin 6.8 this morning going down for [facility] line EGD yesterday showed large hiatal hernia. Patient has not had a colonoscopy and for 5 years. Last time done down [place]. Stools are positive here in the hospital.  Objective: Vital signs in last 24 hours: Temp:  [98 F (36.7 C)-98.6 F (37 C)] 98.2 F (36.8 C) (05/10 1200) Pulse Rate:  [63-75] 68 (05/10 1200) Resp:  [13-27] 25 (05/10 1200) BP: (125-182)/(58-76) 178/74 mmHg (05/10 1200) SpO2:  [98 %-100 %] 100 % (05/10 1200) Last BM Date: 08/02/15  Intake/Output from previous day: 05/09 0701 - 05/10 0700 In: 1500 [P.O.:600; I.V.:900] Out: 1750 [Urine:1750] Intake/Output this shift: Total I/O In: 240 [P.O.:240] Out: -   PE: General-- WF and no distress  Abdomen-- soft and nontender  Lab Results:  Recent Labs  07/31/15 0344 07/31/15 0610 07/31/15 1145 08/01/15 0243 08/01/15 1843 08/01/15 2150 08/02/15 0711 08/02/15 1317  WBC 8.5 9.2 8.2 4.9  --   --   --   --   HGB 9.1* 8.8* 8.5* 7.3* 7.8* 7.3* 6.8* 7.5*  HCT 29.7* 28.2* 26.7* 23.3* 25.1* 23.6* 21.7* 24.0*  PLT 327 272 256 211  --   --   --   --    BMET  Recent Labs  07/31/15 0344 08/01/15 0243  NA 142 143  K 4.0 3.7  CL 111 113*  CO2 20* 20*  CREATININE 1.23* 0.97   LFT  Recent Labs  07/31/15 0344 08/01/15 0243  PROT 5.2* 4.6*  AST 15 14*  ALT 11* 10*  ALKPHOS 79 68  BILITOT 0.4 0.3  BILIDIR <0.1*  --   IBILI NOT CALCULATED  --    PT/INR  Recent Labs  07/31/15 0344  LABPROT 15.6*  INR 1.22   PANCREAS No results for input(s): LIPASE in the last 72 hours.       Studies/Results: No results found.  Medications: I have reviewed the patient's current medications.  Assessment/Plan: 1. Heme positive stool and anemia. Only finding on EGD massive hiatal hernia with slight  esophagitis. No active bleeding and patient on PPI therapy. His had trouble tolerating omeprazole in the past but seems to be doing reasonably well Protonix. She is not had a colonoscopy in 5 years or more and I think we should go ahead and repeat that now. I have discussed this with the patient and her family and she is agreeable. Procedure will be scheduled for 2 o'clock tomorrow. Discussed with patient   Emy Angevine JR,Dynisha Due L 08/02/2015, 2:47 PM  This note was created using voice recognition software. Minor errors may Have occurred unintentionally.  Pager: [phone] If no answer or after hours call [phone]

## 2015-08-02 NOTE — Progress Notes (Signed)
Peripherally Inserted Central Catheter/Midline Placement  The IV Nurse has discussed with the patient and/or persons authorized to consent for the patient, the purpose of this procedure and the potential benefits and risks involved with this procedure.  The benefits include less needle sticks, lab draws from the catheter and patient may be discharged home with the catheter.  Risks include, but not limited to, infection, bleeding, blood clot (thrombus formation), and puncture of an artery; nerve damage and irregular heat beat.  Alternatives to this procedure were also discussed.  PICC/Midline Placement Documentation  PICC Double Lumen 08/02/15 PICC Right Brachial 37 cm 1 cm (Active)  Indication for Insertion or Continuance of Line Poor Vasculature-patient has had multiple peripheral attempts or PIVs lasting less than 24 hours 08/02/2015  6:00 PM  Exposed Catheter (cm) 1 cm 08/02/2015  6:00 PM  Dressing Change Due 08/09/15 08/02/2015  6:00 PM       Stacie Glaze Horton 08/02/2015, 6:55 PM

## 2015-08-02 NOTE — Progress Notes (Signed)
[facility] TEAM 1 - Stepdown/ICU TEAM PROGRESS NOTE  [name] ZOX:[id number] DOB: Jun 02, 1940 DOA: 07/31/2015 PCP: Diane Miyamoto, MD  Admit HPI / Brief Narrative: 75 y.o. female with history of hypertension, hyperlipidemia, GERD, depression, stroke on Plavix and aspirin, glaucoma, chronic kidney disease stage III, and aortic stenosis who presented with the acute onset of coffee-ground emesis.  She felt lightheaded and short of breath.   In the ED hemoglobin dropped from 12.7 on 06/11/15 to 9.1, INR 1.22, troponin 0.05, temperature normal, no tachycardia, stable renal function.    HPI/Subjective: The pt has no complaints.  She denies cp, n/v, HA, or abdom pain.  She is hungry and wants a steak.  Assessment/Plan:  Upper GIB / Hematemesis / [place] + stool  EGD noted large HH - has not been on PPI, and was taking Aleve - to have colonoscopy 5/11   Chronic large Hiatal hernia  Cont PPI and follow (has not tolerated in past) - discussed need to avoid NSAIDs w/ her at length   Acute blood loss anemia  Hgb has been on a downward trend - ASA/Plavix on hold - follow trend   Recent Labs Lab 07/31/15 1145 08/01/15 0243 08/01/15 1843 08/01/15 2150 08/02/15 0711 08/02/15 1317  HGB 8.5* 7.3* 7.8* 7.3* 6.8* 7.5*    Mildly abnormal troponin Most c/w demand ischemia - no chest pain   HTN Uncontrolled - adjust oral tx and follow - BP meds initially on hold due to relative hypotension at admit   Hx of CVA ASA & Plavix on hold due to bleeding  CKD stage III baseline creatinine 1.2 on 06/11/15  Recent Labs Lab 07/31/15 0344 08/01/15 0243  CREATININE 1.23* 0.97    Code Status: NO CODE / DNR  Family Communication: no family present at time of exam Disposition Plan: SDU  Consultants: [facility]  Procedures: EGD - 5/8 - 10 cm hiatal hernia - Mildly severe esophagitis with no bleeding - Localized mild inflammation was found in the gastric antrum. TTE - 5/8 - EF 60-65% -  grade 1 DD  Antibiotics: none  DVT prophylaxis: SCDs  Objective: Blood pressure 178/74, pulse 66, temperature 98.6 F (37 C), temperature source Oral, resp. rate 13, height 5' (1.524 m), weight 77.9 kg (171 lb 11.8 oz), SpO2 100 %.  Intake/Output Summary (Last 24 hours) at 08/02/15 1641 Last data filed at 08/02/15 0913  Gross per 24 hour  Intake   1380 ml  Output    950 ml  Net    430 ml     Exam: General: No acute respiratory distress Lungs: Clear to auscultation bilaterally without wheezes or crackles Cardiovascular: Regular rate and rhythm without 3/6 holosystolic M which radiates into the neck Abdomen: Nontender, nondistended, soft, bowel sounds positive, no rebound, no ascites, no appreciable mass Extremities: No significant cyanosis, clubbing, or edema bilateral lower extremities  Data Reviewed: Basic Metabolic Panel:  Recent Labs Lab 07/31/15 0344 08/01/15 0243  NA 142 143  K 4.0 3.7  CL 111 113*  CO2 20* 20*  GLUCOSE 176* 112*  BUN 53* 40*  CREATININE 1.23* 0.97  CALCIUM 8.8* 8.8*    CBC:  Recent Labs Lab 07/31/15 0344 07/31/15 0610 07/31/15 1145 08/01/15 0243 08/01/15 1843 08/01/15 2150 08/02/15 0711 08/02/15 1317  WBC 8.5 9.2 8.2 4.9  --   --   --   --   NEUTROABS 4.6  --   --   --   --   --   --   --  HGB 9.1* 8.8* 8.5* 7.3* 7.8* 7.3* 6.8* 7.5*  HCT 29.7* 28.2* 26.7* 23.3* 25.1* 23.6* 21.7* 24.0*  MCV 93.7 93.1 92.7 92.8  --   --   --   --   PLT 327 272 256 211  --   --   --   --     Liver Function Tests:  Recent Labs Lab 07/31/15 0344 08/01/15 0243  AST 15 14*  ALT 11* 10*  ALKPHOS 79 68  BILITOT 0.4 0.3  PROT 5.2* 4.6*  ALBUMIN 2.7* 2.5*   Coags:  Recent Labs Lab 07/31/15 0344  INR 1.22   Cardiac Enzymes:  Recent Labs Lab 07/31/15 0344 07/31/15 0610 07/31/15 1145 07/31/15 1734 08/01/15 0243  TROPONINI 0.05* 0.24* 0.22* 0.21* 0.11*    CBG: No results for input(s): GLUCAP in the last 168 hours.  Recent  Results (from the past 240 hour(s))  MRSA PCR Screening     Status: None   Collection Time: 07/31/15 11:50 AM  Result Value Ref Range Status   MRSA by PCR NEGATIVE NEGATIVE Final    Comment:        The GeneXpert MRSA Assay (FDA approved for NASAL specimens only), is one component of a comprehensive MRSA colonization surveillance program. It is not intended to diagnose MRSA infection nor to guide or monitor treatment for MRSA infections.      Studies:   Recent x-ray studies have been reviewed in detail by the Attending Physician  Scheduled Meds:  Scheduled Meds: . escitalopram  10 mg Oral Daily  . hydrALAZINE  25 mg Oral Q6H  . latanoprost  1 drop Both Eyes QHS  . losartan  100 mg Oral Daily  . pantoprazole  40 mg Oral BID  . polyethylene glycol-electrolytes  3,000 mL Oral Once   Followed by  . [START ON 08/03/2015] polyethylene glycol-electrolytes  1,000 mL Oral Once  . prochlorperazine  5 mg Intravenous Once  . sodium chloride flush  3 mL Intravenous Q12H    Time spent on care of this patient: 35 mins   [name] , MD   [facility] Office  [phone] Pager - Text Page per Loretha Stapler as per below:  On-Call/Text Page:      [email or web address]      password [email or web address]  If 7PM-7AM, please contact night-coverage [email or web address] Password [email or web address] 08/02/2015, 4:41 PM   LOS: 1 day

## 2015-08-03 ENCOUNTER — Inpatient Hospital Stay (HOSPITAL_COMMUNITY): Payer: Medicare Other | Admitting: Anesthesiology

## 2015-08-03 ENCOUNTER — Encounter (HOSPITAL_COMMUNITY)
Admission: EM | Disposition: A | Discharge status: Home / Self Care | Payer: Self-pay | Source: Home / Self Care | Attending: Internal Medicine

## 2015-08-03 ENCOUNTER — Encounter (HOSPITAL_COMMUNITY): Payer: Self-pay | Admitting: *Deleted

## 2015-08-03 DIAGNOSIS — R7989 Other specified abnormal findings of blood chemistry: Secondary | ICD-10-CM

## 2015-08-03 HISTORY — PX: COLONOSCOPY: SHX5424

## 2015-08-03 LAB — CBC
HEMATOCRIT: 23.5 % — AB (ref 36.0–46.0)
HEMOGLOBIN: 7.8 g/dL — AB (ref 12.0–15.0)
MCH: 30.2 pg (ref 26.0–34.0)
MCHC: 33.2 g/dL (ref 30.0–36.0)
MCV: 91.1 fL (ref 78.0–100.0)
PLATELETS: 205 10*3/uL (ref 150–400)
RBC: 2.58 MIL/uL — AB (ref 3.87–5.11)
RDW: 14.4 % (ref 11.5–15.5)
WBC: 5.3 10*3/uL (ref 4.0–10.5)

## 2015-08-03 LAB — COMPREHENSIVE METABOLIC PANEL
ALT: 12 U/L — AB (ref 14–54)
AST: 15 U/L (ref 15–41)
Albumin: 2.6 g/dL — ABNORMAL LOW (ref 3.5–5.0)
Alkaline Phosphatase: 75 U/L (ref 38–126)
Anion gap: 6 (ref 5–15)
BILIRUBIN TOTAL: 1 mg/dL (ref 0.3–1.2)
BUN: 12 mg/dL (ref 6–20)
CHLORIDE: 114 mmol/L — AB (ref 101–111)
CO2: 24 mmol/L (ref 22–32)
CREATININE: 0.95 mg/dL (ref 0.44–1.00)
Calcium: 9.2 mg/dL (ref 8.9–10.3)
GFR calc Af Amer: >60 mL/min (ref >60)
GFR, EST NON AFRICAN AMERICAN: 58 mL/min — AB (ref >60)
Glucose, Bld: 108 mg/dL — ABNORMAL HIGH (ref 65–99)
Potassium: 3.6 mmol/L (ref 3.5–5.1)
Sodium: 144 mmol/L (ref 135–145)
Total Protein: 4.9 g/dL — ABNORMAL LOW (ref 6.5–8.1)

## 2015-08-03 LAB — TYPE AND SCREEN
ABO/RH(D): O POS
Antibody Screen: NEGATIVE
Unit division: 0

## 2015-08-03 LAB — HEMOGLOBIN AND HEMATOCRIT, BLOOD
HEMATOCRIT: 25.8 % — AB (ref 36.0–46.0)
HEMOGLOBIN: 8.2 g/dL — AB (ref 12.0–15.0)

## 2015-08-03 SURGERY — COLONOSCOPY
Anesthesia: Monitor Anesthesia Care

## 2015-08-03 MED ORDER — PROPOFOL 10 MG/ML IV BOLUS
INTRAVENOUS | Status: DC | PRN
  Administered 2015-08-03: 20 mg via INTRAVENOUS
  Administered 2015-08-03: 15 mg via INTRAVENOUS
  Administered 2015-08-03: 10 mg via INTRAVENOUS

## 2015-08-03 MED ORDER — SODIUM CHLORIDE 0.9 % IV SOLN: INTRAVENOUS | Status: DC

## 2015-08-03 MED ORDER — PROPOFOL 500 MG/50ML IV EMUL
INTRAVENOUS | Status: DC | PRN
  Administered 2015-08-03: 75 ug/kg/min via INTRAVENOUS

## 2015-08-03 MED ORDER — LACTATED RINGERS IV SOLN
INTRAVENOUS | Status: DC
  Administered 2015-08-03 (×2): via INTRAVENOUS

## 2015-08-03 NOTE — H&P (View-Only) (Signed)
[facility] PROGRESS NOTE Subjective patient without further gross bleeding. Her hemoglobin continues to drift down. Having issues with IV access. Hemoglobin 6.8 this morning going down for PIC line EGD yesterday showed large hiatal hernia. Patient has not had a colonoscopy and for 5 years. Last time done down [place]. Stools are positive here in the hospital.  Objective: Vital signs in last 24 hours: Temp:  [98 F (36.7 C)-98.6 F (37 C)] 98.2 F (36.8 C) (05/10 1200) Pulse Rate:  [63-75] 68 (05/10 1200) Resp:  [13-27] 25 (05/10 1200) BP: (125-182)/(58-76) 178/74 mmHg (05/10 1200) SpO2:  [98 %-100 %] 100 % (05/10 1200) Last BM Date: 08/02/15  Intake/Output from previous day: 05/09 0701 - 05/10 0700 In: 1500 [P.O.:600; I.V.:900] Out: 1750 [Urine:1750] Intake/Output this shift: Total I/O In: 240 [P.O.:240] Out: -   PE: General-- WF and no distress  Abdomen-- soft and nontender  Lab Results:  Recent Labs  07/31/15 0344 07/31/15 0610 07/31/15 1145 08/01/15 0243 08/01/15 1843 08/01/15 2150 08/02/15 0711 08/02/15 1317  WBC 8.5 9.2 8.2 4.9  --   --   --   --   HGB 9.1* 8.8* 8.5* 7.3* 7.8* 7.3* 6.8* 7.5*  HCT 29.7* 28.2* 26.7* 23.3* 25.1* 23.6* 21.7* 24.0*  PLT 327 272 256 211  --   --   --   --    BMET  Recent Labs  07/31/15 0344 08/01/15 0243  NA 142 143  K 4.0 3.7  CL 111 113*  CO2 20* 20*  CREATININE 1.23* 0.97   LFT  Recent Labs  07/31/15 0344 08/01/15 0243  PROT 5.2* 4.6*  AST 15 14*  ALT 11* 10*  ALKPHOS 79 68  BILITOT 0.4 0.3  BILIDIR <0.1*  --   IBILI NOT CALCULATED  --    PT/INR  Recent Labs  07/31/15 0344  LABPROT 15.6*  INR 1.22   PANCREAS No results for input(s): LIPASE in the last 72 hours.       Studies/Results: No results found.  Medications: I have reviewed the patient's current medications.  Assessment/Plan: 1. Heme positive stool and anemia. Only finding on EGD massive hiatal hernia with slight  esophagitis. No active bleeding and patient on PPI therapy. His had trouble tolerating omeprazole in the past but seems to be doing reasonably well Protonix. She is not had a colonoscopy in 5 years or more and I think we should go ahead and repeat that now. I have discussed this with the patient and her family and she is agreeable. Procedure will be scheduled for 2 o'clock tomorrow. Discussed with patient   Linard Daft JR,Leatrice Parilla L 08/02/2015, 2:47 PM  This note was created using voice recognition software. Minor errors may Have occurred unintentionally.  Pager: [phone] If no answer or after hours call [phone]   

## 2015-08-03 NOTE — Progress Notes (Signed)
[place] TEAM 1 - Stepdown/ICU TEAM Progress Note  TIMMIA COGBURN ZOX:[id number] DOB: 12-31-1940 DOA: 07/31/2015 PCP: [name], MD  Admit HPI / Brief Narrative: 75 y.o.WF PMHx Depression HTN, HLD, GERD, CVA  on  Plavix and aspirin, glaucoma, CKD stage III, aortic stenosis, large Hiatal Hernia   Who presents with coffee-ground emesis.  Patient reports that she started having nausea and vomiting coffee ground material last night. The vomitus are in dark brown color, and is in large volume. Patient feels lightheaded and has mild shortness of breath. No fever, chills or cough. She does not have chest pain or abdominal pain. She denies symptoms of UTI. Patient reports that she has some mild left-sided weakness from previous stroke which has not changed. No vision change or hearing loss. Pt states that she had EGD many years ago which was negative, and colonoscopy approximately 4-5 years ago which was negative.   ED Course: pt was found to have hemoglobin dropped from 12.7 on 06/11/15-->9.1, INR 1.22, troponin 0.05, temperature normal, no tachycardia, stable renal function, pending urinalysis. Patient is placed on stepdown bed for observation  HPI/Subjective: 5/11 A/O 4, NAD.   Assessment/Plan:  Upper GIB / Hematemesis/Acute Blood Loss Anemia -H/H QID -Continue normal saline 67ml/hr  -Occult blood; positive -5/10 transfused 1 unit PRBC -Continue Protonix 40 mg BID -Transfuse for hemoglobin<7 -5/11 colonoscopy pending  Hiatal Hernia -Chronic patient aware, believes was present on her previous EGD 5+ years ago. -GI no further workup required.  Mildly abnormal troponin -Most likely secondary to demand ischemia will continue to trend  HTN -Patient with recent CVA (March 2017), maintain SBP 140- 160 -Losartan 100 mg daily -Hydralazine 25 mg QID -PRN Hydralazine  CVA -Patient with stroke in March 2017. Brain MRI showed Multifocal patchy acute ischemic infarcts in pons,  bilateral middle cerebellar peduncle is, and left cerebellar hemisphere - EC aspirin 325 mg daily on hold secondary to GI bleed -Plavix on hold secondary to GI bleed  CKD stage III(Baseline creatinine 1.2 on 06/11/15) Lab Results  Component Value Date   CREATININE 0.95 08/03/2015   CREATININE 0.97 08/01/2015   CREATININE 1.23* 07/31/2015    Code Status: FULL Family Communication: Daughter Scientist, research (physical sciences)) and husband present at time of exam Disposition Plan: Per GI    Consultants: Dr.James [facility]    Procedure/Significant Events: 5/8 echocardiogram;LVEF 60% to 65%.-(grade 1 diastolic dysfunction). - Aortic valve: Trileaflet. Sclerosis without stenosis. 5/8 EGD;-  10 cm hiatal hernia was present. -Mildly severe esophagitis with no bleeding was found at the gastroesophageal junction.-Localized mild inflammation was found in the gastric antrum. 5/10 transfused 1 unit PRBC   Culture NA  Antibiotics: NA  DVT prophylaxis: SCD   Devices NA   LINES / TUBES:      Continuous Infusions: . sodium chloride 10 mL/hr at 08/02/15 2332    Objective: VITAL SIGNS: Temp: 98 F (36.7 C) (05/11 0800) Temp Source: Oral (05/11 0800) BP: 166/76 mmHg (05/11 0800) Pulse Rate: 70 (05/11 0800) SPO2; FIO2:   Intake/Output Summary (Last 24 hours) at 08/03/15 0827 Last data filed at 08/03/15 0500  Gross per 24 hour  Intake 1373.67 ml  Output      0 ml  Net 1373.67 ml     Exam: General: A/O 4, NAD, No acute respiratory distress Eyes: negative scleral hemorrhage, negative icterus ENT: Negative Runny nose, negative gingival bleeding, Neck:  Negative scars, masses, torticollis, lymphadenopathy, JVD Lungs: Clear to auscultation bilaterally without wheezes or crackles Cardiovascular: Regular rate  and rhythm without murmur gallop or rub normal S1 and S2 Abdomen:negative abdominal pain, nondistended, positive soft, bowel sounds, no rebound, no ascites, no appreciable  mass Extremities:  positive hematoma right knee, and left lateral ankle (secondary to patient being evacuated by ambulance from home), No significant cyanosis, clubbing, or edema bilateral lower extremities Psychiatric:  Negative depression, negative anxiety, negative fatigue, negative mania  Neurologic:  Cranial nerves II through XII intact, tongue/uvula midline, all extremities muscle strength 5/5, sensation intact throughout,  negative dysarthria, negative expressive aphasia, negative receptive aphasia.   Data Reviewed: Basic Metabolic Panel:  Recent Labs Lab 07/31/15 0344 08/01/15 0243 08/03/15 0230  NA 142 143 144  K 4.0 3.7 3.6  CL 111 113* 114*  CO2 20* 20* 24  GLUCOSE 176* 112* 108*  BUN 53* 40* 12  CREATININE 1.23* 0.97 0.95  CALCIUM 8.8* 8.8* 9.2   Liver Function Tests:  Recent Labs Lab 07/31/15 0344 08/01/15 0243 08/03/15 0230  AST 15 14* 15  ALT 11* 10* 12*  ALKPHOS 79 68 75  BILITOT 0.4 0.3 1.0  PROT 5.2* 4.6* 4.9*  ALBUMIN 2.7* 2.5* 2.6*   No results for input(s): LIPASE, AMYLASE in the last 168 hours. No results for input(s): AMMONIA in the last 168 hours. CBC:  Recent Labs Lab 07/31/15 0344 07/31/15 0610 07/31/15 1145 08/01/15 0243 08/01/15 1843 08/01/15 2150 08/02/15 0711 08/02/15 1317 08/03/15 0230  WBC 8.5 9.2 8.2 4.9  --   --   --   --  5.3  NEUTROABS 4.6  --   --   --   --   --   --   --   --   HGB 9.1* 8.8* 8.5* 7.3* 7.8* 7.3* 6.8* 7.5* 7.8*  HCT 29.7* 28.2* 26.7* 23.3* 25.1* 23.6* 21.7* 24.0* 23.5*  MCV 93.7 93.1 92.7 92.8  --   --   --   --  91.1  PLT 327 272 256 211  --   --   --   --  205   Cardiac Enzymes:  Recent Labs Lab 07/31/15 0344 07/31/15 0610 07/31/15 1145 07/31/15 1734 08/01/15 0243  TROPONINI 0.05* 0.24* 0.22* 0.21* 0.11*   BNP (last 3 results) No results for input(s): BNP in the last 8760 hours.  ProBNP (last 3 results) No results for input(s): PROBNP in the last 8760 hours.  CBG: No results for  input(s): GLUCAP in the last 168 hours.  Recent Results (from the past 240 hour(s))  MRSA PCR Screening     Status: None   Collection Time: 07/31/15 11:50 AM  Result Value Ref Range Status   MRSA by PCR NEGATIVE NEGATIVE Final    Comment:        The GeneXpert MRSA Assay (FDA approved for NASAL specimens only), is one component of a comprehensive MRSA colonization surveillance program. It is not intended to diagnose MRSA infection nor to guide or monitor treatment for MRSA infections.      Studies:  Recent x-ray studies have been reviewed in detail by the Attending Physician  Scheduled Meds:  Scheduled Meds: . escitalopram  10 mg Oral Daily  . hydrALAZINE  25 mg Oral Q6H  . latanoprost  1 drop Both Eyes QHS  . losartan  100 mg Oral Daily  . pantoprazole  40 mg Oral BID  . prochlorperazine  5 mg Intravenous Once  . sodium chloride flush  10-40 mL Intracatheter Q12H  . sodium chloride flush  3 mL Intravenous Q12H    Time  spent on care of this patient: 40 mins   WOODS, Roselind Messier , MD  [facility] Office  [phone] Pager [phone]  On-Call/Text Page:      [email or web address]      password [email or web address]  If 7PM-7AM, please contact night-coverage [email or web address] Password [email or web address] 08/03/2015, 8:27 AM   LOS: 2 days   Care during the described time interval was provided by me .  I have reviewed this patient's available data, including medical history, events of note, physical examination, and all test results as part of my evaluation. I have personally reviewed and interpreted all radiology studies.   Carolyne Littles, MD [phone] Pager

## 2015-08-03 NOTE — Op Note (Signed)
[facility] Patient Name: [name] Procedure Date : 08/03/2015 MRN: [id number] Attending MD: Tresea Mall , MD Date of Birth: Apr 10, 1940 CSN: [id number] Age: 75 Admit Type: Inpatient Procedure:                Colonoscopy Indications:              Heme positive stool, Iron deficiency anemia                            secondary to chronic blood loss, Iron deficiency                            anemia Providers:                Llana Aliment. Randa Evens, MD, Tomma Rakers, RN, Beryle Beams, Technician, Vinnie Langton, CRNA Referring MD:              Medicines:                Propofol total dose 250 mg IV Complications:            No immediate complications. Estimated Blood Loss:     Estimated blood loss: none. Procedure:                Pre-Anesthesia Assessment:                           - Prior to the procedure, a History and Physical                            was performed, and patient medications and                            allergies were reviewed. The patient's tolerance of                            previous anesthesia was also reviewed. The risks                            and benefits of the procedure and the sedation                            options and risks were discussed with the patient.                            All questions were answered, and informed consent                            was obtained. Prior Anticoagulants: The patient has                            taken no previous anticoagulant or antiplatelet  agents. ASA Grade Assessment: III - A patient with                            severe systemic disease. After reviewing the risks                            and benefits, the patient was deemed in                            satisfactory condition to undergo the procedure.                           After obtaining informed consent, the colonoscope                            was passed under  direct vision. Throughout the                            procedure, the patient's blood pressure, pulse, and                            oxygen saturations were monitored continuously. The                            EC-[id number] ([id number]) scope was introduced through                            the anus and advanced to the the cecum, identified                            by appendiceal orifice and ileocecal valve. The                            colonoscopy was performed without difficulty. The                            patient tolerated the procedure well. The quality                            of the bowel preparation was good. The ileocecal                            valve, appendiceal orifice, and rectum were                            photographed. Scope In: 2:29:26 PM Scope Out: 2:50:49 PM Scope Withdrawal Time: 0 hours 7 minutes 4 seconds  Total Procedure Duration: 0 hours 21 minutes 23 seconds  Findings:      The perianal and digital rectal examinations were normal.      There is no endoscopic evidence of bleeding, diverticula, inflammation,       mass, polyps or tumor in the entire colon.      Internal hemorrhoids were found during retroflexion. The hemorrhoids       were  medium-sized and Grade I (internal hemorrhoids that do not       prolapse). Impression:               - Internal hemorrhoids.                           - No specimens collected.                           - Iron Deficiency Anemia                           - Blood in stool without cause found on endoscopic                            exam Moderate Sedation:      MAC by anesthesia Recommendation:           - Patient has a contact number available for                            emergencies. The signs and symptoms of potential                            delayed complications were discussed with the                            patient. Return to normal activities tomorrow.                            Written discharge  instructions were provided to the                            patient.                           - Resume previous diet.                           - Continue present medications.                           - Repeat colonoscopy in 10 years for screening                            purposes.                           - Return patient to hospital ward for ongoing care. Procedure Code(s):        --- Professional ---                           [phone], Colonoscopy, flexible; diagnostic, including                            collection of specimen(s) by brushing or washing,  when performed (separate procedure) Diagnosis Code(s):        --- Professional ---                           D50.0, Iron deficiency anemia secondary to blood                            loss (chronic)                           D50.9, Iron deficiency anemia, unspecified                           R19.5, Other fecal abnormalities                           K64.0, First degree hemorrhoids CPT copyright 2016 American Medical Association. All rights reserved. The codes documented in this report are preliminary and upon coder review may  be revised to meet current compliance requirements. Carman Ching, MD Tresea Mall, MD 08/03/2015 3:07:57 PM This report has been signed electronically. Number of Addenda: 0

## 2015-08-03 NOTE — Progress Notes (Signed)
PT Cancellation Note  Patient Details Name: [name] MRN: [id number] DOB: [birth year]   Cancelled Treatment:    Reason Eval/Treat Not Completed: Medical issues which prohibited therapy (Pt drinking golitely for test.  Nurse deferred PT.)Thanks.   Tawni Millers F 08/03/2015, 10:51 AM  [facility] [phone] (pager)

## 2015-08-03 NOTE — Interval H&P Note (Signed)
History and Physical Interval Note:  08/03/2015 2:17 PM  [name]  has presented today for surgery, with the diagnosis of GI bleed  The various methods of treatment have been discussed with the patient and family. After consideration of risks, benefits and other options for treatment, the patient has consented to  Procedure(s): COLONOSCOPY (N/A) as a surgical intervention .  The patient's history has been reviewed, patient examined, no change in status, stable for surgery.  I have reviewed the patient's chart and labs.  Questions were answered to the patient's satisfaction.     Kirk Sampley JR,[name]

## 2015-08-03 NOTE — Transfer of Care (Signed)
Immediate Anesthesia Transfer of Care Note  Patient: [name]  Procedure(s) Performed: Procedure(s): COLONOSCOPY (N/A)  Patient Location: Endoscopy Unit  Anesthesia Type:MAC  Level of Consciousness: awake, alert , oriented and patient cooperative  Airway & Oxygen Therapy: Patient Spontanous Breathing  Post-op Assessment: Report given to RN and Post -op Vital signs reviewed and stable  Post vital signs: Reviewed and stable  Last Vitals:  Filed Vitals:   08/03/15 1334 08/03/15 1500  BP: 195/49 166/65  Pulse: 75   Temp: 36.9 C 37.1 C  Resp: 20     Last Pain:  Filed Vitals:   08/03/15 1502  PainSc: 0-No pain         Complications: No apparent anesthesia complications

## 2015-08-03 NOTE — Anesthesia Postprocedure Evaluation (Signed)
Anesthesia Post Note  Patient: [name]  Procedure(s) Performed: Procedure(s) (LRB): COLONOSCOPY (N/A)  Patient location during evaluation: PACU Anesthesia Type: MAC Level of consciousness: awake and alert Pain management: pain level controlled Vital Signs Assessment: post-procedure vital signs reviewed and stable Respiratory status: spontaneous breathing, nonlabored ventilation, respiratory function stable and patient connected to nasal cannula oxygen Cardiovascular status: stable and blood pressure returned to baseline Anesthetic complications: no    Last Vitals:  Filed Vitals:   08/03/15 1600 08/03/15 1727  BP:  159/58  Pulse: 77 80  Temp: 36.7 C   Resp: 16 18    Last Pain:  Filed Vitals:   08/03/15 1733  PainSc: 0-No pain                 Jmichael Gille J

## 2015-08-03 NOTE — Anesthesia Preprocedure Evaluation (Addendum)
Anesthesia Evaluation  Patient identified by MRN, date of birth, ID band Patient awake    Reviewed: Allergy & Precautions, NPO status , Patient's Chart, lab work & pertinent test results  Airway Mallampati: II  TM Distance: >3 FB Neck ROM: Full    Dental no notable dental hx.    Pulmonary neg pulmonary ROS,    Pulmonary exam normal breath sounds clear to auscultation       Cardiovascular hypertension, Pt. on medications + Peripheral Vascular Disease  Normal cardiovascular exam+ dysrhythmias + Valvular Problems/Murmurs  Rhythm:Regular Rate:Normal     Neuro/Psych PSYCHIATRIC DISORDERS Depression Left sided weakness  Neuromuscular disease CVA, Residual Symptoms    GI/Hepatic Neg liver ROS, hiatal hernia, GERD  Medicated,  Endo/Other  negative endocrine ROS  Renal/GU Renal diseaseCKD stage 3  negative genitourinary   Musculoskeletal negative musculoskeletal ROS (+)   Abdominal (+) + obese,   Peds negative pediatric ROS (+)  Hematology  (+) anemia ,   Anesthesia Other Findings   Reproductive/Obstetrics negative OB ROS                            Anesthesia Physical Anesthesia Plan  ASA: III  Anesthesia Plan: MAC   Post-op Pain Management:    Induction: Intravenous  Airway Management Planned: Natural Airway  Additional Equipment:   Intra-op Plan:   Post-operative Plan:   Informed Consent: I have reviewed the patients History and Physical, chart, labs and discussed the procedure including the risks, benefits and alternatives for the proposed anesthesia with the patient or authorized representative who has indicated his/her understanding and acceptance.   Dental advisory given  Plan Discussed with: CRNA  Anesthesia Plan Comments:         Anesthesia Quick Evaluation

## 2015-08-04 ENCOUNTER — Encounter (HOSPITAL_COMMUNITY): Payer: Self-pay | Admitting: Gastroenterology

## 2015-08-04 LAB — CBC
HCT: 27 % — ABNORMAL LOW (ref 36.0–46.0)
Hemoglobin: 8.6 g/dL — ABNORMAL LOW (ref 12.0–15.0)
MCH: 28.8 pg (ref 26.0–34.0)
MCHC: 31.9 g/dL (ref 30.0–36.0)
MCV: 90.3 fL (ref 78.0–100.0)
PLATELETS: 235 10*3/uL (ref 150–400)
RBC: 2.99 MIL/uL — AB (ref 3.87–5.11)
RDW: 14.5 % (ref 11.5–15.5)
WBC: 6.7 10*3/uL (ref 4.0–10.5)

## 2015-08-04 LAB — GLUCOSE, CAPILLARY: GLUCOSE-CAPILLARY: 116 mg/dL — AB (ref 65–99)

## 2015-08-04 MED ORDER — ASPIRIN EC 81 MG PO TBEC
81.0000 mg | DELAYED_RELEASE_TABLET | Freq: Every day | ORAL | Status: DC
  Administered 2015-08-04 – 2015-08-06 (×3): 81 mg via ORAL
  Filled 2015-08-04 (×3): qty 1

## 2015-08-04 MED ORDER — AMLODIPINE BESYLATE 10 MG PO TABS
10.0000 mg | ORAL_TABLET | Freq: Every day | ORAL | Status: DC
  Administered 2015-08-04 – 2015-08-06 (×3): 10 mg via ORAL
  Filled 2015-08-04 (×3): qty 1

## 2015-08-04 NOTE — Progress Notes (Signed)
Physical Therapy Treatment Patient Details Name: [name] MRN: [id number] DOB: 01-03-1941 Today's Date: 08/04/2015    History of Present Illness 75 y.o. 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 She felt lightheaded and short of breath. s/p endoscopy and colonoscopy     PT Comments    Patient reported a "spell of dizziness" that lasted ~15 minutes this morning. She reported (and confirmed in [facility]) that she had multiple cerebellar/midbrain CVAs due to poor posterior circulation. She reports if her BP gets too low (<140 SBP) she becomes symptomatic. RN made aware. Patient able to tolerate bed exercises, dangle EOB, stand, and walking with appropriate rise in BP and asymptomatic (strength and coordination tested throughout without changes). Educated pt to perform UE exercises to "boost" her BP when/if she feels it is low (or is having symptoms).    Follow Up Recommendations  No PT follow up;Supervision for mobility/OOB     Equipment Recommendations  None recommended by PT    Recommendations for Other Services       Precautions / Restrictions Precautions Precautions: Fall Restrictions Weight Bearing Restrictions: No    Mobility  Bed Mobility Overal bed mobility: Needs Assistance Bed Mobility: Supine to Sit     Supine to sit: Min guard     General bed mobility comments: incr effort and time, no assist  Transfers Overall transfer level: Needs assistance Equipment used: Rolling walker (2 wheeled) Transfers: Sit to/from Stand Sit to Stand: Supervision         General transfer comment: vc for safe use of RW  Ambulation/Gait Ambulation/Gait assistance: Min guard Ambulation Distance (Feet): 80  Feet Assistive device: Rolling walker (2 wheeled) Gait Pattern/deviations: Step-through pattern;Antalgic;Decreased stride length;Wide base of support     General Gait Details: antalgic due to prior hip fracture   Stairs            Wheelchair Mobility    Modified Rankin (Stroke Patients Only)       Balance   Sitting-balance support: No upper extremity supported;Feet unsupported Sitting balance-Leahy Scale: Good     Standing balance support: No upper extremity supported Standing balance-Leahy Scale: Fair                      Cognition Arousal/Alertness: Awake/alert Behavior During Therapy: WFL for tasks assessed/performed Overall Cognitive Status: Within Functional Limits for tasks assessed                      Exercises Other Exercises Other Exercises: reassessed all 4 extremities for strength and coordination with no deficits noted    General Comments General comments (skin integrity, edema, etc.): Daughter present and very concerned pt had a "dizzy spell" ~45 minutes prior to PT arrival. She reports pt's neurologist wants her SBP between 140-160 due to posterior circulation blockages and feels her BP was too low after last dose of BP meds. Closely monitored BP and performed supine exercises prior to sitting EOB      Pertinent Vitals/Pain BP supine 146/49 BP sit  151/54 BP stand 158/59 BP after walk 160/55  Pain Assessment: No/denies pain    Home Living                      Prior Function  PT Goals (current goals can now be found in the care plan section) Acute Rehab PT Goals Patient Stated Goal: to get better Time For Goal Achievement: 08/15/15 Progress towards PT goals: Progressing toward goals    Frequency  Min 3X/week    PT Plan Discharge plan needs to be updated    Co-evaluation             End of Session Equipment Utilized During Treatment: Gait belt Activity Tolerance: Patient limited by  fatigue Patient left: with call bell/phone within reach;in bed;with family/visitor present (sitting EOB with family; RN aware)     Time: 1610-9604 PT Time Calculation (min) (ACUTE ONLY): 38 min  Charges:  $Gait Training: 8-22 mins $Therapeutic Exercise: 8-22 mins $Therapeutic Activity: 8-22 mins                    G Codes:      Schae Cando 08/06/15, 12:42 PM  Pager [phone]

## 2015-08-04 NOTE — Progress Notes (Signed)
[facility] PROGRESS NOTE Subjective patient without any gross bleeding. Colonoscopy yesterday nonremarkable. She tells me that she has been receiving IV iron on a fairly regular basis from her doctor down [place].  Objective: Vital signs in last 24 hours: Temp:  [97.4 F (36.3 C)-99 F (37.2 C)] 98.7 F (37.1 C) (05/12 0700) Pulse Rate:  [57-80] 70 (05/12 0700) Resp:  [16-20] 20 (05/12 0700) BP: (134-209)/(49-77) 158/66 mmHg (05/12 0700) SpO2:  [96 %-100 %] 97 % (05/12 0700) Last BM Date: 08/03/15  Intake/Output from previous day: 05/11 0701 - 05/12 0700 In: 193 [I.V.:193] Out: 3 [Blood:3] Intake/Output this shift: Total I/O In: 13 [I.V.:13] Out: -   PE:  Abdomen-- soft and nontender  Lab Results:  Recent Labs  08/01/15 2150 08/02/15 0711 08/02/15 1317 08/03/15 0230 08/03/15 2120  WBC  --   --   --  5.3  --   HGB 7.3* 6.8* 7.5* 7.8* 8.2*  HCT 23.6* 21.7* 24.0* 23.5* 25.8*  PLT  --   --   --  205  --    BMET  Recent Labs  08/03/15 0230  NA 144  K 3.6  CL 114*  CO2 24  CREATININE 0.95   LFT  Recent Labs  08/03/15 0230  PROT 4.9*  AST 15  ALT 12*  ALKPHOS 75  BILITOT 1.0   PT/INR No results for input(s): LABPROT, INR in the last 72 hours. PANCREAS No results for input(s): LIPASE in the last 72 hours.       Studies/Results: No results found.  Medications: I have reviewed the patient's current medications.  Assessment/Plan: 1. Positive stool with iron deficiency anemia. The patient's colonoscopy was nonremarkable. She had an exceedingly large hiatal hernia with a large part of the stomach in the chest with reflux esophagitis and some gastritis. It is common for individuals with large hiatal hernia is to be iron deficient. 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 This clearly was inadequate. She now has been tolerating  pantoprazole BID. This is not quite as closely related to Nexium and omeprazole and she is not as of yet had a neuropathies associated with this. I feel that she could be discharged home on BID pantoprazole to follow-up with her doctor and [place] and continue to receive episodic IV iron. We will sign off and we happen to see back on as needed basis.   Andraya Frigon JR,Silviano Neuser L 08/04/2015, 9:54 AM  This note was created using voice recognition software. Minor errors may Have occurred unintentionally.  Pager: [phone] If no answer or after hours call [phone]

## 2015-08-04 NOTE — Progress Notes (Signed)
Pt transfer from 2 central, alert and responsive, no s/s of distress noted will monitor for rectal bleeding.

## 2015-08-04 NOTE — Care Management Important Message (Signed)
Important Message  Patient Details  Name: [name] MRN: [id number] Date of Birth: 1940-07-31   Medicare Important Message Given:  Yes    Dain Laseter Abena 08/04/2015, 11:00 AM

## 2015-08-04 NOTE — Consult Note (Signed)
   [facility] Inpatient Consult   08/04/2015  LEOTTA SYFRETT 1940-06-07 [id number]  Patient is currently active [up to admission]  with [facility] Care Management for chronic disease management services with Medicare/ACO Registry.  Patient has been engaged by a [employer].  Our community based plan of care has focused on disease management and community resource support. Active consent on file.    Patient will receive a post discharge transition of care call and will be evaluated for monthly home visits for assessments and disease process education.  Made Inpatient Case Manager aware that [facility] Care Management following and she states that the GI physician has signed off and the patient will likely discharge soon.  Patient currently receiving care.   Of note, [facility] Care Management services does not replace or interfere with any services that are needed or arranged by inpatient case management or social work.  For additional questions or referrals please contact:  Charlesetta Shanks, RN BSN CCM [facility]  [phone] business mobile phone Toll free office [phone]

## 2015-08-04 NOTE — Progress Notes (Signed)
Called to patient room by pt daughter. Per daughter, patient is feeling dizzy. Patient found resting in bed with eyes closed. Per patient, feels dizzy when she opens her eyes. BP is 142/50, which is reduced from 158/66 prior to administration of Cozaar. Per pt daughter, the same sudden dizzy spell occurred yesterday, 08/03/2015. Patient was checked for stroke symptoms as she has parameters to keep BP between 140-160 systolic due to hx stroke. Dr. Sharon Seller was notified. Patient in hospital for possible GI bleed- CBC re-ordered. Patient reports symptoms of dizziness resolved at 1115.   Noe Gens, RN

## 2015-08-04 NOTE — Progress Notes (Signed)
[place] TEAM 1 - Stepdown/ICU TEAM PROGRESS NOTE  [name] ZRA:[id number] DOB: Aug 18, 1940 DOA: 07/31/2015 PCP: [name], MD  Admit HPI / Brief Narrative: 75 y.o. female with history of hypertension, hyperlipidemia, GERD, depression, stroke on Plavix and aspirin, glaucoma, chronic kidney disease stage III, and aortic stenosis who presented with the acute onset of coffee-ground emesis.  She felt lightheaded and short of breath.   In the ED hemoglobin dropped from 12.7 on 06/11/15 to 9.1, INR 1.22, troponin 0.05, temperature normal, no tachycardia, stable renal function.    HPI/Subjective: The patient had an episode of dizziness earlier today.  During this time her systolic blood pressure was 134.  The patient and family feel that when her blood pressure improved to the 150 range that her symptoms resolved.  She denies chest pain fevers chills nausea or vomiting.  She denies any other focal neurologic deficits at this time.  She's had no hematemesis or GI upset.  Assessment/Plan:  Upper GIB / Hematemesis / Guiac + stool  EGD noted very large HH - has not been on PPI, and was taking Aleve - colonoscopy 5/11 unremarkable - cont PPI   Chronic large Hiatal hernia  Cont PPI BID and follow (has not tolerated in past) - discussed need to avoid NSAIDs w/ her at length   Acute blood loss anemia  Hemoglobin appears to have stabilized - resume aspirin with plan to resume Plavix in 1 week if patient continues to not have symptoms of upper GI bleeding - very lengthy discussion was carried out with the patient and her daughter concerning the risk benefit analysis of aspirin plus Plavix for stroke prophylaxis versus risk of GI bleeding  Recent Labs Lab 08/01/15 2150 08/02/15 0711 08/02/15 1317 08/03/15 0230 08/03/15 2120 08/04/15 1159  HGB 7.3* 6.8* 7.5* 7.8* 8.2* 8.6*    Mildly abnormal troponin Most c/w demand ischemia - no chest pain   HTN Remains uncontrolled - patient  reportedly was using her blood pressure medications "as needed" at home - avoid overcorrection in this patient with multifocal severe cerebrovascular disease - have counseled patient and her daughter extensively that our goal should be a systolic blood pressure of approximately 140  Hx of CVA ASA & Plavix have been on hold due to bleeding - resume low-dose aspirin today - resume Plavix in approximately 7 days if no further signs of bleeding  CKD stage III baseline creatinine 1.2 on 06/11/15 - creatinine presently better than baseline  Recent Labs Lab 07/31/15 0344 08/01/15 0243 08/03/15 0230  CREATININE 1.23* 0.97 0.95    Code Status: NO CODE / DNR  Family Communication: extensive discussion with patient and daughter at bedside Disposition Plan: Transfer to medical bed - potential discharge home 24-48 hours  Consultants: [facility]  Procedures: EGD - 5/8 - 10 cm hiatal hernia - Mildly severe esophagitis with no bleeding - Localized mild inflammation was found in the gastric antrum. TTE - 5/8 - EF 60-65% - grade 1 DD Colonoscopy 5/11 - unrevealing    Antibiotics: none  DVT prophylaxis: SCDs  Objective: Blood pressure 160/55, pulse 98, temperature 98.2 F (36.8 C), temperature source Oral, resp. rate 22, height 5' (1.524 m), weight 77.9 kg (171 lb 11.8 oz), SpO2 97 %.  Intake/Output Summary (Last 24 hours) at 08/04/15 1616 Last data filed at 08/04/15 1300  Gross per 24 hour  Intake     26 ml  Output    650 ml  Net   -624 ml  Exam: General: No acute respiratory distress Lungs: Clear to auscultation bilaterally - no wheezes or crackles Cardiovascular: Regular rate and rhythm without 3/6 holosystolic M Abdomen: Nontender, nondistended, soft, bowel sounds positive, no rebound, no ascites, no appreciable mass Extremities: No significant cyanosis, clubbing, edema bilateral lower extremities  Data Reviewed: Basic Metabolic Panel:  Recent Labs Lab 07/31/15 0344  08/01/15 0243 08/03/15 0230  NA 142 143 144  K 4.0 3.7 3.6  CL 111 113* 114*  CO2 20* 20* 24  GLUCOSE 176* 112* 108*  BUN 53* 40* 12  CREATININE 1.23* 0.97 0.95  CALCIUM 8.8* 8.8* 9.2    CBC:  Recent Labs Lab 07/31/15 0344 07/31/15 0610 07/31/15 1145 08/01/15 0243  08/02/15 0711 08/02/15 1317 08/03/15 0230 08/03/15 2120 08/04/15 1159  WBC 8.5 9.2 8.2 4.9  --   --   --  5.3  --  6.7  NEUTROABS 4.6  --   --   --   --   --   --   --   --   --   HGB 9.1* 8.8* 8.5* 7.3*  < > 6.8* 7.5* 7.8* 8.2* 8.6*  HCT 29.7* 28.2* 26.7* 23.3*  < > 21.7* 24.0* 23.5* 25.8* 27.0*  MCV 93.7 93.1 92.7 92.8  --   --   --  91.1  --  90.3  PLT 327 272 256 211  --   --   --  205  --  235  < > = values in this interval not displayed.  Liver Function Tests:  Recent Labs Lab 07/31/15 0344 08/01/15 0243 08/03/15 0230  AST 15 14* 15  ALT 11* 10* 12*  ALKPHOS 79 68 75  BILITOT 0.4 0.3 1.0  PROT 5.2* 4.6* 4.9*  ALBUMIN 2.7* 2.5* 2.6*   Coags:  Recent Labs Lab 07/31/15 0344  INR 1.22   Cardiac Enzymes:  Recent Labs Lab 07/31/15 0344 07/31/15 0610 07/31/15 1145 07/31/15 1734 08/01/15 0243  TROPONINI 0.05* 0.24* 0.22* 0.21* 0.11*    CBG:  Recent Labs Lab 08/04/15 1028  GLUCAP 116*    Recent Results (from the past 240 hour(s))  MRSA PCR Screening     Status: None   Collection Time: 07/31/15 11:50 AM  Result Value Ref Range Status   MRSA by PCR NEGATIVE NEGATIVE Final    Comment:        The GeneXpert MRSA Assay (FDA approved for NASAL specimens only), is one component of a comprehensive MRSA colonization surveillance program. It is not intended to diagnose MRSA infection nor to guide or monitor treatment for MRSA infections.      Studies:   Recent x-ray studies have been reviewed in detail by the Attending Physician  Scheduled Meds:  Scheduled Meds: . escitalopram  10 mg Oral Daily  . hydrALAZINE  25 mg Oral Q6H  . latanoprost  1 drop Both Eyes QHS  .  losartan  100 mg Oral Daily  . pantoprazole  40 mg Oral BID  . prochlorperazine  5 mg Intravenous Once  . sodium chloride flush  10-40 mL Intracatheter Q12H  . sodium chloride flush  3 mL Intravenous Q12H    Time spent on care of this patient: 35 mins   Jiovani Mccammon T , MD   [facility] Office  [phone] Pager - Text Page per Loretha Stapler as per below:  On-Call/Text Page:      [email or web address]      password [email or web address]  If 7PM-7AM, please contact night-coverage [email or web address] Password [facility] 08/04/2015, 4:16 PM  LOS: 3 days

## 2015-08-05 LAB — CBC
HCT: 24.6 % — ABNORMAL LOW (ref 36.0–46.0)
HEMATOCRIT: 27.2 % — AB (ref 36.0–46.0)
HEMOGLOBIN: 8.5 g/dL — AB (ref 12.0–15.0)
Hemoglobin: 7.9 g/dL — ABNORMAL LOW (ref 12.0–15.0)
MCH: 29.9 pg (ref 26.0–34.0)
MCH: 29.1 pg (ref 26.0–34.0)
MCHC: 32.1 g/dL (ref 30.0–36.0)
MCHC: 31.3 g/dL (ref 30.0–36.0)
MCV: 93.2 fL (ref 78.0–100.0)
MCV: 93.2 fL (ref 78.0–100.0)
PLATELETS: 207 10*3/uL (ref 150–400)
Platelets: 242 10*3/uL (ref 150–400)
RBC: 2.64 MIL/uL — AB (ref 3.87–5.11)
RBC: 2.92 MIL/uL — ABNORMAL LOW (ref 3.87–5.11)
RDW: 14.7 % (ref 11.5–15.5)
RDW: 14.8 % (ref 11.5–15.5)
WBC: 5.2 10*3/uL (ref 4.0–10.5)
WBC: 6 10*3/uL (ref 4.0–10.5)

## 2015-08-05 NOTE — Progress Notes (Signed)
[place] TEAM 1 - Stepdown/ICU TEAM PROGRESS NOTE  SUNAINA FERRANDO WUJ:[id number] DOB: 13-Feb-1941 DOA: 07/31/2015 PCP: [name], MD  Admit HPI / Brief Narrative: 75 y.o. female with history of hypertension, hyperlipidemia, GERD, depression, stroke on Plavix and aspirin, glaucoma, chronic kidney disease stage III, and aortic stenosis who presented with the acute onset of coffee-ground emesis.  She felt lightheaded and short of breath.   In the ED hemoglobin dropped from 12.7 on 06/11/15 to 9.1, INR 1.22, troponin 0.05, temperature normal, no tachycardia, stable renal function.    HPI/Subjective: The patient has no complaints today.  She denies dizziness or lightheadedness.  She is tolerating her diet without difficulty.  She has expressed no chest pain or shortness of breath.  Assessment/Plan:  Upper GIB / Hematemesis / Guiac + stool  EGD noted very large HH - has not been on PPI and was taking Aleve - colonoscopy 5/11 unremarkable - cont PPI BID   Chronic large Hiatal hernia  Cont PPI BID and follow (has not tolerated in past) - discussed need to avoid NSAIDs w/ her at length   Acute blood loss anemia  Hemoglobin on a possible downward trend again? - resumed aspirin 5/12 with plan to resume Plavix in 1 week if patient continues to not have symptoms of upper GI bleeding - very lengthy discussion was carried out with the patient and her daughter concerning the risk benefit analysis of aspirin plus Plavix for stroke prophylaxis versus risk of GI bleeding  Recent Labs Lab 08/02/15 0711 08/02/15 1317 08/03/15 0230 08/03/15 2120 08/04/15 1159 08/05/15 0510  HGB 6.8* 7.5* 7.8* 8.2* 8.6* 7.9*    Mildly abnormal troponin Most c/w demand ischemia - no chest pain   HTN Currently at goal - patient reportedly was using her blood pressure medications "as needed" at home - avoid overcorrection in this patient with multifocal severe cerebrovascular disease - have counseled patient  and her daughter extensively that our goal should be a systolic blood pressure of approximately 140  Hx of CVA ASA & Plavix have been on hold due to bleeding - resumed low-dose aspirin 5/12 - resume Plavix 5/19 if no further signs of bleeding  CKD stage III baseline creatinine 1.2 on 06/11/15 - creatinine presently better than baseline  Recent Labs Lab 07/31/15 0344 08/01/15 0243 08/03/15 0230  CREATININE 1.23* 0.97 0.95    Code Status: NO CODE / DNR  Family Communication: extensive discussion with multiple family members at bedside Disposition Plan: potential discharge home in AM   Consultants: [facility]  Procedures: EGD - 5/8 - 10 cm hiatal hernia - Mildly severe esophagitis with no bleeding - Localized mild inflammation was found in the gastric antrum. TTE - 5/8 - EF 60-65% - grade 1 DD Colonoscopy 5/11 - unrevealing    Antibiotics: none  DVT prophylaxis: SCDs  Objective: Blood pressure 142/62, pulse 73, temperature 99.1 F (37.3 C), temperature source Oral, resp. rate 20, height 5' (1.524 m), weight 77.9 kg (171 lb 11.8 oz), SpO2 97 %.  Intake/Output Summary (Last 24 hours) at 08/05/15 1306 Last data filed at 08/05/15 0957  Gross per 24 hour  Intake    933 ml  Output    501 ml  Net    432 ml     Exam: General: No acute respiratory distress Lungs: Clear to auscultation bilaterally  Cardiovascular: Regular rate and rhythm without holosystolic M Abdomen: Nontender, nondistended, soft, bowel sounds positive, no rebound, no ascites, no appreciable mass Extremities: No  significant cyanosis, clubbing, or edema bilateral lower extremities  Data Reviewed: Basic Metabolic Panel:  Recent Labs Lab 07/31/15 0344 08/01/15 0243 08/03/15 0230  NA 142 143 144  K 4.0 3.7 3.6  CL 111 113* 114*  CO2 20* 20* 24  GLUCOSE 176* 112* 108*  BUN 53* 40* 12  CREATININE 1.23* 0.97 0.95  CALCIUM 8.8* 8.8* 9.2    CBC:  Recent Labs Lab 07/31/15 0344  07/31/15 1145  08/01/15 0243  08/02/15 1317 08/03/15 0230 08/03/15 2120 08/04/15 1159 08/05/15 0510  WBC 8.5  < > 8.2 4.9  --   --  5.3  --  6.7 5.2  NEUTROABS 4.6  --   --   --   --   --   --   --   --   --   HGB 9.1*  < > 8.5* 7.3*  < > 7.5* 7.8* 8.2* 8.6* 7.9*  HCT 29.7*  < > 26.7* 23.3*  < > 24.0* 23.5* 25.8* 27.0* 24.6*  MCV 93.7  < > 92.7 92.8  --   --  91.1  --  90.3 93.2  PLT 327  < > 256 211  --   --  205  --  235 207  < > = values in this interval not displayed.  Liver Function Tests:  Recent Labs Lab 07/31/15 0344 08/01/15 0243 08/03/15 0230  AST 15 14* 15  ALT 11* 10* 12*  ALKPHOS 79 68 75  BILITOT 0.4 0.3 1.0  PROT 5.2* 4.6* 4.9*  ALBUMIN 2.7* 2.5* 2.6*   Coags:  Recent Labs Lab 07/31/15 0344  INR 1.22   Cardiac Enzymes:  Recent Labs Lab 07/31/15 0344 07/31/15 0610 07/31/15 1145 07/31/15 1734 08/01/15 0243  TROPONINI 0.05* 0.24* 0.22* 0.21* 0.11*    CBG:  Recent Labs Lab 08/04/15 1028  GLUCAP 116*    Recent Results (from the past 240 hour(s))  MRSA PCR Screening     Status: None   Collection Time: 07/31/15 11:50 AM  Result Value Ref Range Status   MRSA by PCR NEGATIVE NEGATIVE Final    Comment:        The GeneXpert MRSA Assay (FDA approved for NASAL specimens only), is one component of a comprehensive MRSA colonization surveillance program. It is not intended to diagnose MRSA infection nor to guide or monitor treatment for MRSA infections.      Studies:   Recent x-ray studies have been reviewed in detail by the Attending Physician  Scheduled Meds:  Scheduled Meds: . amLODipine  10 mg Oral Daily  . aspirin EC  81 mg Oral Daily  . escitalopram  10 mg Oral Daily  . latanoprost  1 drop Both Eyes QHS  . losartan  100 mg Oral Daily  . pantoprazole  40 mg Oral BID  . sodium chloride flush  10-40 mL Intracatheter Q12H  . sodium chloride flush  3 mL Intravenous Q12H    Time spent on care of this patient: 25 mins   Vale Peraza T ,  MD   [facility] Office  [phone] Pager - Text Page per Loretha Stapler as per below:  On-Call/Text Page:      [email or web address]      password [email or web address]  If 7PM-7AM, please contact night-coverage [email or web address] Password [email or web address] 08/05/2015, 1:06 PM   LOS: 4 days

## 2015-08-06 LAB — CBC
HCT: 28 % — ABNORMAL LOW (ref 36.0–46.0)
HEMATOCRIT: 22.9 % — AB (ref 36.0–46.0)
HEMOGLOBIN: 7.4 g/dL — AB (ref 12.0–15.0)
Hemoglobin: 8.8 g/dL — ABNORMAL LOW (ref 12.0–15.0)
MCH: 30 pg (ref 26.0–34.0)
MCH: 29 pg (ref 26.0–34.0)
MCHC: 32.3 g/dL (ref 30.0–36.0)
MCHC: 31.4 g/dL (ref 30.0–36.0)
MCV: 92.7 fL (ref 78.0–100.0)
MCV: 92.4 fL (ref 78.0–100.0)
PLATELETS: 238 10*3/uL (ref 150–400)
Platelets: 197 10*3/uL (ref 150–400)
RBC: 2.47 MIL/uL — ABNORMAL LOW (ref 3.87–5.11)
RBC: 3.03 MIL/uL — AB (ref 3.87–5.11)
RDW: 14.8 % (ref 11.5–15.5)
RDW: 14.7 % (ref 11.5–15.5)
WBC: 5.3 10*3/uL (ref 4.0–10.5)
WBC: 5.5 10*3/uL (ref 4.0–10.5)

## 2015-08-06 MED ORDER — CLOPIDOGREL BISULFATE 75 MG PO TABS: 75.0000 mg | ORAL_TABLET | Freq: Every day | ORAL | Status: DC

## 2015-08-06 MED ORDER — PANTOPRAZOLE SODIUM 40 MG PO TBEC: 40.0000 mg | DELAYED_RELEASE_TABLET | Freq: Two times a day (BID) | ORAL | Status: DC

## 2015-08-06 NOTE — Discharge Instructions (Signed)
Gastrointestinal Bleeding °Gastrointestinal bleeding is bleeding somewhere along the path that food travels through the body (digestive tract). This path is anywhere between the mouth and the opening of the butt (anus). You may have blood in your throw up (vomit) or in your poop (stools). If there is a lot of bleeding, you may need to stay in the hospital. °HOME CARE °· Only take medicine as told by your doctor. °· Eat foods with fiber such as whole grains, fruits, and vegetables. You can also try eating 1 to 3 prunes a day. °· Drink enough fluids to keep your pee (urine) clear or pale yellow. °GET HELP RIGHT AWAY IF:  °· Your bleeding gets worse. °· You feel dizzy, weak, or you pass out (faint). °· You have bad cramps in your back or belly (abdomen). °· You have large blood clumps (clots) in your poop. °· Your problems are getting worse. °MAKE SURE YOU:  °· Understand these instructions. °· Will watch your condition. °· Will get help right away if you are not doing well or get worse. °  °This information is not intended to replace advice given to you by your health care provider. Make sure you discuss any questions you have with your health care provider. °  °Document Released: 12/19/2007 Document Revised: 02/26/2012 Document Reviewed: 08/29/2014 °Elsevier Interactive Patient Education ©2016 [employer] ° °

## 2015-08-06 NOTE — Progress Notes (Signed)
Pt discharged to home.  Discharge instructions explained to pt.  Family member had a question regarding parameters for blood pressure medication at the time of discharge. Notified MD of family member's questions.  MD called back and notified family of what doctor stated to do regarding her medication.  MD stated to take the medication daily and not PRN, but not to take the medication if patient felt dizzy or lightheaded.  Patient understands information she was given.  PICC line removed by IV team.  Pt states she has all belongings.  Pt taken off unit in wheelchair by staff.

## 2015-08-06 NOTE — Discharge Summary (Signed)
DISCHARGE SUMMARY  [name]  MR#: [id number]  DOB:08-14-1940  Date of Admission: 07/31/2015 Date of Discharge: 08/06/2015  Attending Physician:[name]  Patient's WUJ:[id number] Diane Dredge, MD  ConsultsDeboraha Yates GI  Disposition: D/C home   Follow-up Appts:     Follow-up Information    Follow up with Diane Miyamoto, MD In 5 days.   Specialty:  Family Medicine   Contact information:   [address] [place] [address]       Tests Needing Follow-up: -repeat CBC is suggested 5-7 days post d/c  -Plavix should be resumed 5/19 if the pt has no obvious ongoing blood loss   Discharge Diagnoses: Upper GIB / Hematemesis / Guiac + stool  Chronic large Hiatal hernia  Acute blood loss anemia  Mildly abnormal troponin HTN Hx of CVA CKD stage III  Initial presentation: 75 y.o. female with history of hypertension, hyperlipidemia, GERD, depression, stroke on Plavix and aspirin, glaucoma, chronic kidney disease stage III, and aortic stenosis who presented with the acute onset of coffee-ground emesis. She felt lightheaded and short of breath.   In the ED hemoglobin dropped from 12.7 on 06/11/15 to 9.1, INR 1.22, troponin 0.05, temperature normal, no tachycardia, stable renal function.   Hospital Course:  Upper GIB / Hematemesis / Guiac + stool  EGD noted very large HH - has not been on PPI and was taking Aleve - colonoscopy 5/11 unremarkable - cont PPI BID   Chronic large Hiatal hernia  Cont PPI BID and follow (has not tolerated in past) - discussed need to avoid NSAIDs w/ her at length   Acute blood loss anemia  Hemoglobin stable at time of d/c - resumed aspirin 5/12 with plan to resume Plavix 5/19 if patient continues to not have symptoms of upper GI bleeding - very lengthy discussion was carried out with the patient and her daughter concerning the risk benefit analysis of aspirin plus Plavix for stroke prophylaxis versus risk of GI bleeding  Recent  Labs Lab 08/02/15 1317 08/03/15 0230 08/03/15 2120 08/04/15 1159 08/05/15 0510 08/05/15 1850 08/06/15 0508 08/06/15 1100  HGB 7.5* 7.8* 8.2* 8.6* 7.9* 8.5* 7.4* 8.8*    Mildly abnormal troponin Most c/w demand ischemia - no chest pain   HTN at goal of 140-150 at time of d/c - patient reportedly was using her blood pressure medications "as needed" at home - avoid overcorrection in this patient with multifocal severe cerebrovascular disease - have counseled patient and her daughter extensively that our goal should be a systolic blood pressure of approximately 140-150  Hx of CVA ASA & Plavix have been on hold due to bleeding - resumed low-dose aspirin 5/12 - resume Plavix 5/19 if no further signs of bleeding  CKD stage III baseline creatinine 1.2 on 06/11/15 - creatinine presently better than baseline  Recent Labs Lab 07/31/15 0344 08/01/15 0243 08/03/15 0230  CREATININE 1.23* 0.97 0.95      Medication List    STOP taking these medications        furosemide 20 MG tablet  Commonly known as:  LASIX     ranitidine 150 MG tablet  Commonly known as:  ZANTAC      TAKE these medications        amLODipine 10 MG tablet  Commonly known as:  NORVASC  Take 10 mg by mouth as needed (for blood pressure).     aspirin 325 MG tablet  Take 1 tablet (325 mg total) by mouth daily.  clopidogrel 75 MG tablet  Commonly known as:  PLAVIX  Take 1 tablet (75 mg total) by mouth daily.  Start taking on:  08/11/2015     escitalopram 10 MG tablet  Commonly known as:  LEXAPRO  Take 10 mg by mouth daily.     latanoprost 0.005 % ophthalmic solution  Commonly known as:  XALATAN  Place 1 drop into both eyes at bedtime.     losartan 100 MG tablet  Commonly known as:  COZAAR  Take 100 mg by mouth as needed (for blood pressure).     niacin 250 MG CR capsule  Take 1 capsule (250 mg total) by mouth at bedtime.     pantoprazole 40 MG tablet  Commonly known as:  PROTONIX  Take 1  tablet (40 mg total) by mouth 2 (two) times daily.     Vitamin D 2000 units Caps  Take 2,000 Units by mouth daily.        Day of Discharge BP 151/70 mmHg  Pulse 78  Temp(Src) 98.8 F (37.1 C) (Oral)  Resp 20  Ht 5' (1.524 m)  Wt 77.9 kg (171 lb 11.8 oz)  BMI 33.54 kg/m2  SpO2 100%  Physical Exam: General: No acute respiratory distress Lungs: Clear to auscultation bilaterally without wheezes or crackles Cardiovascular: Regular rate and rhythm without murmur gallop or rub normal S1 and S2 Abdomen: Nontender, nondistended, soft, bowel sounds positive, no rebound, no ascites, no appreciable mass Extremities: No significant cyanosis, clubbing, or edema bilateral lower extremities  Basic Metabolic Panel:  Recent Labs Lab 07/31/15 0344 08/01/15 0243 08/03/15 0230  NA 142 143 144  K 4.0 3.7 3.6  CL 111 113* 114*  CO2 20* 20* 24  GLUCOSE 176* 112* 108*  BUN 53* 40* 12  CREATININE 1.23* 0.97 0.95  CALCIUM 8.8* 8.8* 9.2    Liver Function Tests:  Recent Labs Lab 07/31/15 0344 08/01/15 0243 08/03/15 0230  AST 15 14* 15  ALT 11* 10* 12*  ALKPHOS 79 68 75  BILITOT 0.4 0.3 1.0  PROT 5.2* 4.6* 4.9*  ALBUMIN 2.7* 2.5* 2.6*    Coags:  Recent Labs Lab 07/31/15 0344  INR 1.22   CBC:  Recent Labs Lab 07/31/15 0344  08/04/15 1159 08/05/15 0510 08/05/15 1850 08/06/15 0508 08/06/15 1100  WBC 8.5  < > 6.7 5.2 6.0 5.3 5.5  NEUTROABS 4.6  --   --   --   --   --   --   HGB 9.1*  < > 8.6* 7.9* 8.5* 7.4* 8.8*  HCT 29.7*  < > 27.0* 24.6* 27.2* 22.9* 28.0*  MCV 93.7  < > 90.3 93.2 93.2 92.7 92.4  PLT 327  < > 235 207 242 197 238  < > = values in this interval not displayed.  Cardiac Enzymes:  Recent Labs Lab 07/31/15 0344 07/31/15 0610 07/31/15 1145 07/31/15 1734 08/01/15 0243  TROPONINI 0.05* 0.24* 0.22* 0.21* 0.11*    CBG:  Recent Labs Lab 08/04/15 1028  GLUCAP 116*    Recent Results (from the past 240 hour(s))  MRSA PCR Screening     Status:  None   Collection Time: 07/31/15 11:50 AM  Result Value Ref Range Status   MRSA by PCR NEGATIVE NEGATIVE Final    Comment:        The GeneXpert MRSA Assay (FDA approved for NASAL specimens only), is one component of a comprehensive MRSA colonization surveillance program. It is not intended to diagnose MRSA infection nor to  guide or monitor treatment for MRSA infections.      Time spent in discharge (includes decision making & examination of pt): >35 minutes  08/06/2015, 11:51 AM   Lonia Blood, MD [facility] Office  [phone] Pager [phone]  On-Call/Text Page:      [email or web address]      password [facility]

## 2015-08-07 ENCOUNTER — Encounter: Payer: Self-pay | Admitting: *Deleted

## 2015-08-07 ENCOUNTER — Other Ambulatory Visit: Payer: Self-pay | Admitting: *Deleted

## 2015-08-07 NOTE — Patient Outreach (Signed)
[place] [facility]) Care Management  08/07/2015  RENETTA SUMAN 1940-12-09 [id number]   Transition of care initial call  RNCM placed call to patient as part of transition of care. Mrs.Umholtz reports she is doing great denies nausea, pain or any abdominal discomfort.   Patient was recently discharged from hospital and all medications have been reviewed. Mrs.Leaming voiced understanding of not starting her Plavix until 4/19 per discharge instructions. Mrs.Rager reports she has not had BM today, but reports bowel movement on yesterday, no blood noted and voices understanding of notify MD of any bloody emesis or stool.  Mrs.Nield continues to use her walker in the home. Encouraged Mrs. Fleeger to continue to monitor and record her blood pressure readings at home.  Mrs.Roa has scheduled appointment with Dr.Perry on 5/19 and she has transportation.  Plan Patient will remain active with transition of care program and will receive weekly outreaches.  [facility] CM Care Plan Problem One        Most Recent Value   Care Plan Problem One  [place] for upper GI bleed   Role Documenting the Problem One  Care Management [place] for Problem One  Active   [facility]N Long Term Goal (31-90 days)  Patient will not experience a hospital admission in the next 31 days   [facility] Long Term Goal Start Date  08/07/15   [facility] Long Term Goal Met Date  07/24/15   Interventions for Problem One Long Term Goal  Reviewed transition of care program,Encouraged patient in the  importance following MD medication regimen and notifying MD sooner of new symptoms   [facility] CM Short Term Goal #1 (0-30 days)  Patient will attend PCP visit in the 5 days per MD discharge instructions   [facility] CM Short Term Goal #1 Start Date  08/07/15   [facility] CM Short Term Goal #1 Met Date  07/18/15   Interventions for Short Term Goal #1  Verified patient has made post hospital visit and has transportation   [facility] CM Short Term Goal #2  (0-30 days)  Patient will report following MD medication instructions for restarting Plavix on 5/19    [facility] CM Short Term Goal #2 Start Date  08/07/15   [facility] CM Short Term Goal #2 Met Date  07/05/15   Interventions for Short Term Goal #2  Reinforced with patient the importance of adhering to MD instructions, regarding specific timing to restarting Plavix, rational explained.   [facility] CM Short Term Goal #3 (0-30 days)  Patient be able to state signs of GI bleed to report to the MD in the next 7 days   [facility] CM Short Term Goal #3 Start Date  08/07/15   [facility] CM Short Term Goal #3 Met Date  06/28/15 [Patient has scheduled appointment]   Interventions for Short Tern Goal #3  Educated on signs of bleeding to recognize, dark,redden color to stools, vomiting dark reddish brown emesis , abdomen discomfort or distention and the importance of notifying MD .   [facility] CM Short Term Goal #4 (0-30 days)  Patient will be able to state at least 3 signs symptoms of stroke in the next 30 days   [facility] CM Short Term Goal #4 Start Date  06/27/15   [facility] CM Short Term Goal #4 Met Date  07/05/15   [facility] CM Short Term Goal #5 (0-30 days)  Patient will be able to report making food choices lower in salt.in the next 30 days   [facility]  CM Short Term Goal #5 Start Date  07/05/15   [facility] CM Short Term Goal #5 Met Date  07/18/15    Joylene Draft, RN, [place] Management [phone]- Mobile [phone]- Toll Free Main Office

## 2015-08-10 DIAGNOSIS — I447 Left bundle-branch block, unspecified: Secondary | ICD-10-CM | POA: Diagnosis not present

## 2015-08-10 DIAGNOSIS — K922 Gastrointestinal hemorrhage, unspecified: Secondary | ICD-10-CM | POA: Diagnosis not present

## 2015-08-10 DIAGNOSIS — Z6833 Body mass index (BMI) 33.0-33.9, adult: Secondary | ICD-10-CM | POA: Diagnosis not present

## 2015-08-10 DIAGNOSIS — I10 Essential (primary) hypertension: Secondary | ICD-10-CM | POA: Diagnosis not present

## 2015-08-10 DIAGNOSIS — I428 Other cardiomyopathies: Secondary | ICD-10-CM | POA: Diagnosis not present

## 2015-08-16 ENCOUNTER — Other Ambulatory Visit: Payer: Self-pay | Admitting: *Deleted

## 2015-08-16 ENCOUNTER — Ambulatory Visit: Payer: Self-pay | Admitting: *Deleted

## 2015-08-16 DIAGNOSIS — E785 Hyperlipidemia, unspecified: Secondary | ICD-10-CM | POA: Diagnosis not present

## 2015-08-16 DIAGNOSIS — I639 Cerebral infarction, unspecified: Secondary | ICD-10-CM | POA: Diagnosis not present

## 2015-08-16 DIAGNOSIS — J45909 Unspecified asthma, uncomplicated: Secondary | ICD-10-CM | POA: Diagnosis not present

## 2015-08-16 DIAGNOSIS — I428 Other cardiomyopathies: Secondary | ICD-10-CM | POA: Diagnosis not present

## 2015-08-16 DIAGNOSIS — I10 Essential (primary) hypertension: Secondary | ICD-10-CM | POA: Diagnosis not present

## 2015-08-16 DIAGNOSIS — Z6832 Body mass index (BMI) 32.0-32.9, adult: Secondary | ICD-10-CM | POA: Diagnosis not present

## 2015-08-16 NOTE — Patient Outreach (Addendum)
[facility] ([facility]) Care Management  08/16/2015  [name] 01/21/1941 [id number]   Arrived at [name] home for scheduled  transition of care home visit , no answer at door and no answer by phone, left a HIPAA compliant message with my return call back number.  Plan  RN will await return call, if no response will plan attempt to contact [name] in the next 3 days.  1600  Transition of care  Telephone call from [name] [name] reports she had an appointment with Dr.Perry and then lab work to check her hemoglobin.  [name] reports she is doing well at home, her blood pressure this am was 140/65 before her appointment.  [name]  reports she is managing well at home, taking her medication as prescribed, checking her blood pressure daily and recording and watching the salt in her diet.  [name] denies signs of bleeding.  [name] states she uses her walker when outside her home , but just holds on to furniture in home. Encouraged [name] to use her walker or cane for support in home to prevent fall.  Kimberly Glover, RN, PCCN [facility] Care Management [phone]- Mobile [phone]- Toll Free Main Office  [facility] CM Care Plan Problem One        Most Recent Value   Care Plan Problem One  Recent Hospital Admisssion for upper GI bleed   Role Documenting the Problem One  Care Management Coordinator   Care Plan for Problem One  Active   [facility]N Long Term Goal (31-90 days)  [name] will not experience a hospital admission in the next 31 days   [facility] Long Term Goal Start Date  08/07/15   Interventions for Problem One Long Term Goal  Reinforced importance of taking medication, adhering to follow up appointments and notify MD of symptoms    [facility] CM Short Term Goal #1 (0-30 days)  [name] will attend PCP visit in the 5 days per MD discharge instructions   [facility] CM Short Term Goal #1 Start Date  08/07/15   [facility] CM Short Term Goal #1 Met Date  08/16/15   [facility] CM Short Term Goal #2 (0-30 days)   [name] will report following MD medication instructions for restarting Plavix on 5/19    [facility] CM Short Term Goal #2 Start Date  08/07/15   Interventions for Short Term Goal #2  Reviewed importance of sticking to MD orders for medications   [facility] CM Short Term Goal #3 (0-30 days)  [name] be able to state signs of GI bleed to report to the MD in the next 7 days   [facility] CM Short Term Goal #3 Start Date  08/07/15   Interventions for Short Tern Goal #3  Reinforced signs of bleeding to be aware of and notify MD      

## 2015-08-23 DIAGNOSIS — E785 Hyperlipidemia, unspecified: Secondary | ICD-10-CM | POA: Diagnosis not present

## 2015-08-24 ENCOUNTER — Other Ambulatory Visit: Payer: Self-pay | Admitting: *Deleted

## 2015-08-24 ENCOUNTER — Encounter: Payer: Self-pay | Admitting: *Deleted

## 2015-08-24 NOTE — Patient Outreach (Addendum)
[place] [facility]) Care Management   08/24/2015  MATY ZEISLER January 28, 1941 [id number]  BEONKA AMESQUITA is an 75 y.o. female   Transition of care home visit  Subjective:  Patient reports that she is doing great, takes her blood pressure medication at night because it sometimes makes her sleepy.  Objective:  BP 160/80 mmHg  Pulse 72  Resp 18  SpO2 96%  Patient sitting at table, not wearing shoes. Review of Systems  Constitutional: Negative.   HENT: Negative.   Eyes: Negative.   Respiratory: Negative.   Cardiovascular: Negative.   Gastrointestinal: Negative.   Genitourinary: Negative.   Musculoskeletal: Negative for falls.  Skin: Negative.   Neurological: Negative.   Psychiatric/Behavioral: Negative.     Physical Exam  Constitutional: She is oriented to person, place, and time. She appears well-developed and well-nourished.  Cardiovascular: Normal rate.   Respiratory: Effort normal and breath sounds normal.  GI: Soft.  Neurological: She is alert and oriented to person, place, and time.  Skin: Skin is warm and dry.  Psychiatric: She has a normal mood and affect. Her behavior is normal. Judgment and thought content normal.    Encounter Medications:   Outpatient Encounter Prescriptions as of 08/24/2015  Medication Sig  . amLODipine (NORVASC) 10 MG tablet Take 10 mg by mouth as needed (for blood pressure). Takes when blood over 150/  . aspirin 325 MG tablet Take 1 tablet (325 mg total) by mouth daily. (Patient taking differently: Take 325 mg by mouth daily. Enteric coated)  . clopidogrel (PLAVIX) 75 MG tablet Take 1 tablet (75 mg total) by mouth daily.  [name] escitalopram (LEXAPRO) 10 MG tablet Take 10 mg by mouth daily.  [name] latanoprost (XALATAN) 0.005 % ophthalmic solution Place 1 drop into both eyes at bedtime.  [name] losartan (COZAAR) 100 MG tablet Take 100 mg by mouth as needed (for blood pressure). Takes when blood pressure greater than 150/  . pantoprazole (PROTONIX)  40 MG tablet Take 1 tablet (40 mg total) by mouth 2 (two) times daily.  . niacin 250 MG CR capsule Take 1 capsule (250 mg total) by mouth at bedtime. (Patient not taking: Reported on 08/24/2015)   No facility-administered encounter medications on file as of 08/24/2015.    Functional Status:   In your present state of health, do you have any difficulty performing the following activities: 08/16/2015 08/01/2015  Hearing? N N  Vision? N N  Difficulty concentrating or making decisions? N N  Walking or climbing stairs? Y Y  Dressing or bathing? N N  Doing errands, shopping? N N  Preparing Food and eating ? N -  Using the Toilet? N -  In the past six months, have you accidently leaked urine? Y -  Do you have problems with loss of bowel control? N -  Managing your Medications? N -  Managing your Finances? N -  Housekeeping or managing your Housekeeping? Y -   Fall Risk  08/24/2015 06/27/2015  Falls in the past year? Yes Yes  Number falls in past yr: 2 or more 2 or more  Injury with Fall? Yes Yes  Risk Factor Category  High Fall Risk High Fall Risk  Risk for fall due to : History of fall(s) History of fall(s);Impaired balance/gait  Risk for fall due to (comments): - "weak walking on left side  Follow up Falls prevention discussed Falls prevention discussed;Education provided    Fall/Depression Screening:    PHQ 2/9 Scores 08/07/2015 06/27/2015  PHQ -  2 Score 0 0    Assessment:  Routine home visit as part of transition of care   Patient denies any noted symptoms of GI bleed, she restarted he Plavix on 5/19.  Hypertension-  Patient checks her blood pressure per  report morning or evening depending on the day, she does not record readings, states she takes her Losartan and Norvasc daily if blood pressure greater than 150/, patient states blood pressures over 150 most days. Encouraged to keep a record of blood pressures. Patient takes her blood pressure medications at night.  Mrs. Wiechman does not use  a pill organizer for her daily medications she states that this confuses her more. Mrs.Ivy states that she is not taking her Niacin, reinforced importance of taking medications as prescribed.   Mrs. Mruk states she just completed taking lasix daily for a week, and leg swelling has decreased. Mrs.Cange has scales at home but has declined weighing on a regular basis. Reinforced importance of limiting salt in diet.    Fall Risk - No recent falls. Patient uses a walker when out of the home and states only occasionally in the home, noted just holding on the furniture. Mrs.Gentry prefer not to wear shoes. Fall prevention education reviewed.   Encouraged Mrs. Liaw to reschedule her neurology appointment that she missed while hospitalized states she has a note to self to do this.   Plan:  Patient will remain active with transition of care program and weekly outreaches Mrs. Eastwood will begin to record her blood pressure on a regular basis. Placed call to Dr.Perry office spoke with RN to communicate patient blood pressure elevations and medication compliance concern.  [facility] CM Care Plan Problem One        Most Recent Value   Care Plan Problem One  [place] for upper GI bleed   Role Documenting the Problem One  Care Management [place] for Problem One  Active   [facility] Long Term Goal (31-90 days)  Patient will not experience a hospital admission in the next 31 days   [facility] Long Term Goal Start Date  08/07/15   Interventions for Problem One Long Term Goal  Encouraged patient regarding the importance to take medications as prescribed.   [facility] CM Short Term Goal #1 (0-30 days)  Patient will attend PCP visit in the 5 days per MD discharge instructions   [facility] CM Short Term Goal #1 Start Date  08/07/15   [facility] CM Short Term Goal #1 Met Date  08/16/15   [facility] CM Short Term Goal #2 (0-30 days)  Patient will report following MD medication instructions for restarting Plavix on 5/19    [facility] CM  Short Term Goal #2 Start Date  08/07/15   [facility] CM Short Term Goal #2 Met Date  08/24/15   [facility] CM Short Term Goal #3 (0-30 days)  Patient be able to state signs of GI bleed to report to the MD in the next 7 days   [facility] CM Short Term Goal #3 Start Date  08/07/15   [facility] CM Short Term Goal #3 Met Date  08/24/15   [facility] CM Short Term Goal #4 (0-30 days)  Patient will record daily blood pressure readings in the next 30 days   [facility] CM Short Term Goal #4 Start Date  08/24/15   Interventions for Short Term Goal #4  Educated on the importance of keeping a record of her blood pressure reading, to track changes and be able  to refer back record as needed.   [facility] CM Short Term Goal #5 (0-30 days)  Patient will be able to report making food choices lower in salt.in the next 30 days   [facility] CM Short Term Goal #5 Start Date  08/24/15   Interventions for Short Term Goal #5  Educated patient on foods higher in salt to limit,and foods choices lower in salt to choose. to help with blood pressure control      Joylene Draft, RN, [place] Care Management [phone]- Mobile [phone]- [place]

## 2015-08-30 ENCOUNTER — Other Ambulatory Visit: Payer: Self-pay | Admitting: *Deleted

## 2015-08-30 NOTE — Patient Outreach (Signed)
[facility]) Care Management  08/30/2015  [name] 16-Oct-1940 [id number]   Transition of care call  RNCM placed call to patient as part of transition of care program, HIPAA identifiers verified. Mrs.Schum reports that she is doing fine on this morning,denies any pain, dizziness, increased weakness. Patient reports her blood pressure is high on this am 170/80, she has not taken her blood pressure medication yet, she will do that and recheck her blood pressure around lunch. Discussed with Mrs.Vahey the importance of notifying  Dr.Perry for continued elevated blood pressure readings, she verbalized understanding to contact MD for consistent readings greater than 150/ and to keep a record. Reinforced education on limiting salt in diet, including fast foods patient verbalizes she does that.  Mrs.Rothman reports making it around her home slowly without any problems no falls.  Followed up with Mrs.Chargois regarding rescheduling her missed appointment with Neurologist, she states she will do it, I offered assistance with making appointment,she states her daughter was there and will help her.   Plan Patient will remain active with transition of care program, and receive weekly outreaches, scheduled telephone follow up for next week. RN will continue to reinforce education and hypertension and education and  Management, and review patient centered goals. Patient will notify MD of continued elevated reading greater than 150's/  [facility] CM Care Plan Problem One        Most Recent Value   Care Plan Problem One  [place] for upper GI bleed   Role Documenting the Problem One  Care Management [place] for Problem One  Active   [facility] Long Term Goal (31-90 days)  Patient will not experience a hospital admission in the next 31 days   [facility] Long Term Goal Start Date  08/07/15   Interventions for Problem One Long Term Goal  Discussed with patient importance of  notifying MD of new symptoms, and rescheduling neurology appointment, offered to make appointment     [facility] CM Short Term Goal #1 (0-30 days)  Patient will attend PCP visit in the 5 days per MD discharge instructions   [facility] CM Short Term Goal #1 Start Date  08/07/15   [facility] CM Short Term Goal #1 Met Date  08/16/15   [facility] CM Short Term Goal #2 (0-30 days)  Patient will report following MD medication instructions for restarting Plavix on 5/19    [facility] CM Short Term Goal #2 Start Date  08/07/15   [facility] CM Short Term Goal #2 Met Date  08/24/15   [facility] CM Short Term Goal #3 (0-30 days)  Patient be able to state signs of GI bleed to report to the MD in the next 7 days   [facility] CM Short Term Goal #3 Start Date  08/07/15   [facility] CM Short Term Goal #3 Met Date  08/24/15   [facility] CM Short Term Goal #4 (0-30 days)  Patient will record daily blood pressure readings and notify MD of consistent elevated blood pressures greater than 150/ in the next 30 days [goal updated]   [facility] CM Short Term Goal #4 Start Date  08/24/15   Interventions for Short Term Goal #4  Reinforced with patient the importance of keeping a record of blood pressure readings and notifying MD of elevations greater than 150/ her goal,   [facility] CM Short Term Goal #5 (0-30 days)  Patient will be able to report making food choices lower in salt.in the next 30 days  [facility] CM Short Term Goal #5 Start Date  08/24/15   Interventions for Short Term Goal #5  Reviewed importance of limiitng salt in foods and how this affects her blood pressure, educated regarding fast food having greater salt     Joylene Draft, RN, [place] Management [phone]- Mobile [phone]- [place] Office

## 2015-09-07 ENCOUNTER — Other Ambulatory Visit: Payer: Self-pay | Admitting: *Deleted

## 2015-09-07 NOTE — Patient Outreach (Signed)
[facility]) Care Management  09/07/2015  ZINA PITZER [birth year] [id number]   Transition of care call  RN placed telephone call to Mrs.Seelinger, she reports that she is doing just fine.  Patient states that she continues to monitor her blood pressure daily, states blood pressure running better, 150/80 this morning.  Mrs.Hilmer states she is taking her medications daily, still prefers to take from bottles daily instead of using a pill organizer.  Mrs.Doddridge report tolerating ambulating around her home okay, just slow and only uses walker/cane when she goes out,no recent falls. Mrs.Daddario continues to watch salt in her food, states her daughter cooks every other day for them and does not add salt to food. Assessment: Hypertension - benefit from reinforcement of education and management History of CVA - patient able to states sign and symptoms Recent GI Bleed - patient denies any nausea, abdominal discomfort or bleeding  Reinforced with Mrs.Graf the importance of rescheduling her missed Neurology appointment while she was hospitalized, denies care management assistance with making appointment.   Plan Patient has completed transition of care program, will follow up in month for post transition of care home visit and evaluate if any further care management needs prior to closing case. Will review and update patient centered goals, reinforcing education.  THN CM Care Plan Problem One        Most Recent Value   Care Plan Problem One  [place] for upper GI bleed   Role Documenting the Problem One  Care Management [place] for Problem One  Active   THN Long Term Goal (31-90 days)  Patient will not experience a hospital admission in the next 31 days   THN Long Term Goal Start Date  08/07/15   Interventions for Problem One Long Term Goal  Discussed with patient importance of notifying MD of new symptoms, and rescheduling neurology appointment, offered  to make appointment     THN CM Short Term Goal #1 (0-30 days)  Patient will attend PCP visit in the 5 days per MD discharge instructions   THN CM Short Term Goal #1 Start Date  08/07/15   [facility] CM Short Term Goal #1 Met Date  08/16/15   THN CM Short Term Goal #2 (0-30 days)  Patient will report following MD medication instructions for restarting Plavix on 5/19    [facility] CM Short Term Goal #2 Start Date  08/07/15   [facility] CM Short Term Goal #2 Met Date  08/24/15   THN CM Short Term Goal #3 (0-30 days)  Patient be able to state signs of GI bleed to report to the MD in the next 7 days   THN CM Short Term Goal #3 Start Date  08/07/15   [facility] CM Short Term Goal #3 Met Date  08/24/15   THN CM Short Term Goal #4 (0-30 days)  Patient will record daily blood pressure readings and notify MD of consistent elevated blood pressures greater than 150/ in the next 30 days [goal updated]   THN CM Short Term Goal #4 Start Date  08/24/15   Interventions for Short Term Goal #4  Reviewed with patient to keep a written record of her daily blood pressure reading to help track changes and if elevated, note changes in diet that may have occurred.   THN CM Short Term Goal #5 (0-30 days)  Patient will be able to report making food choices lower in salt.in the next 30 days   THN  CM Short Term Goal #5 Start Date  08/24/15   Interventions for Short Term Goal #5  Encouraged  limiting salt in diet, to help with controlling blood pressure and preventing complications     Joylene Draft, RN, [place] Management [phone]- Mobile [phone]- [place]

## 2015-09-11 ENCOUNTER — Telehealth: Payer: Self-pay | Admitting: Cardiovascular Disease

## 2015-09-11 NOTE — Telephone Encounter (Signed)
LM TO CALL BACK ./CY 

## 2015-09-11 NOTE — Telephone Encounter (Signed)
New message    Checking on the status of form that was fax over in April.

## 2015-10-03 ENCOUNTER — Encounter: Payer: Self-pay | Admitting: *Deleted

## 2015-10-03 ENCOUNTER — Other Ambulatory Visit: Payer: Self-pay | Admitting: *Deleted

## 2015-10-03 NOTE — Patient Outreach (Signed)
[facility]) Care Management   10/03/2015  PAISLEIGH MARONEY 1940/08/21 [id number]  AMOUR TRIGG is an 75 y.o. female  Subjective:   Mrs.Prestridge reports that she is doing fine and feeling fine,  Denies any new symptoms. Patient states she just takes it slow, because she get a little wobbly with her left side  uses cane or walker when out of the home. Denies being lightheaded or dizzy. Patient reports tolerating diet well, no signs of bleeding or abdominal discomfort,  reports her recent hemoglobin is stable. Mrs.Iannaccone discussed that she has her niece staying with her now that is able to help her with day to day chores. Mrs.Fatica states she is now using a pill organizer daily to take her medications and her daughter fills it weekly.   Objective:  BP 158/80 mmHg  Pulse 70  Resp 18  SpO2 96% Review of Systems  Constitutional: Negative.   HENT: Negative.   Eyes: Negative.   Respiratory: Negative.   Cardiovascular: Negative.   Gastrointestinal: Negative.   Genitourinary: Negative.   Musculoskeletal: Negative.   Skin: Negative.   Neurological: Negative.   Psychiatric/Behavioral: Negative.     Physical Exam  Constitutional: She is oriented to person, place, and time. She appears well-developed and well-nourished.  Cardiovascular: Normal rate and normal heart sounds.   Respiratory: Effort normal.  GI: Soft.  Neurological: She is alert and oriented to person, place, and time.  Skin: Skin is warm and dry.  Psychiatric: She has a normal mood and affect. Her behavior is normal. Judgment and thought content normal.    Encounter Medications:   Outpatient Encounter Prescriptions as of 10/03/2015  Medication Sig  . amLODipine (NORVASC) 10 MG tablet Take 10 mg by mouth as needed (for blood pressure). Takes when blood over 150/  . clopidogrel (PLAVIX) 75 MG tablet Take 1 tablet (75 mg total) by mouth daily.  [name] escitalopram (LEXAPRO) 10 MG tablet Take 10 mg by mouth daily.   [name] latanoprost (XALATAN) 0.005 % ophthalmic solution Place 1 drop into both eyes at bedtime.  [name] losartan (COZAAR) 100 MG tablet Take 100 mg by mouth as needed (for blood pressure). Takes when blood pressure greater than 150/  . niacin 250 MG CR capsule Take 1 capsule (250 mg total) by mouth at bedtime. (Patient not taking: Reported on 08/24/2015)  . pantoprazole (PROTONIX) 40 MG tablet Take 1 tablet (40 mg total) by mouth 2 (two) times daily.   No facility-administered encounter medications on file as of 10/03/2015.    Functional Status:   In your present state of health, do you have any difficulty performing the following activities: 08/16/2015 08/01/2015  Hearing? N N  Vision? N N  Difficulty concentrating or making decisions? N N  Walking or climbing stairs? Y Y  Dressing or bathing? N N  Doing errands, shopping? N N  Preparing Food and eating ? N -  Using the Toilet? N -  In the past six months, have you accidently leaked urine? Y -  Do you have problems with loss of bowel control? N -  Managing your Medications? N -  Managing your Finances? N -  Housekeeping or managing your Housekeeping? Y -    Fall/Depression Screening:    PHQ 2/9 Scores 08/07/2015 06/27/2015  PHQ - 2 Score 0 0    Assessment:  Routine home visit  Hypertension/Hx CVA - checks blood pressure at least once weekly, she does not always keep a record, denies blood pressure  readings greater than 160/ in the last 2 weeks. Patient is more consistent with daily medications, her daughter fills her pill box on a weekly basis, and she avoids medications such as Aleve.  Patient denies any recent swelling and use of her prn lasix. Patient denies any new symptoms,patient is able to review symptoms of stroke to seek medical attention for. Patient reports that she continues to limit salt in her diet.  Fall Risk Denies fall, reviewed fall prevention strategies , encouraged to continue to use walker or cane.  Mrs.Lanzo still needs  to reschedule office visit with neurology, due to missed appointment while she in the hospital. She states she is aware, declined for me to assist her at this time, states she will ask her daughter, placed sticky note on patient pill organizer related to appointment. Mrs.Leviton discussed her upcoming appointment with cardiology on 7/17. Discussed with Mrs.Mair the importance of follow up PCP as well as all specialist. Reinforced with patient to notify MD of elevated blood pressure readings.  Mrs.Degarmo declines any further care management needs, declines need for further visits or follow up.  Provided Mrs.Deroy with [facility] contact information in case future needs arise.    Plan Will close case per protocol.  Egbert Garibaldi, RN, [facility] Care Management [phone] Mobile [phone]- Toll Free Main Office

## 2015-10-19 DIAGNOSIS — I10 Essential (primary) hypertension: Secondary | ICD-10-CM | POA: Diagnosis not present

## 2015-10-19 DIAGNOSIS — E785 Hyperlipidemia, unspecified: Secondary | ICD-10-CM | POA: Diagnosis not present

## 2015-10-19 DIAGNOSIS — I428 Other cardiomyopathies: Secondary | ICD-10-CM | POA: Diagnosis not present

## 2015-10-19 DIAGNOSIS — D509 Iron deficiency anemia, unspecified: Secondary | ICD-10-CM | POA: Diagnosis not present

## 2015-10-19 DIAGNOSIS — M810 Age-related osteoporosis without current pathological fracture: Secondary | ICD-10-CM | POA: Diagnosis not present

## 2015-10-19 DIAGNOSIS — Z6832 Body mass index (BMI) 32.0-32.9, adult: Secondary | ICD-10-CM | POA: Diagnosis not present

## 2015-10-19 DIAGNOSIS — G459 Transient cerebral ischemic attack, unspecified: Secondary | ICD-10-CM | POA: Diagnosis not present

## 2015-12-01 DIAGNOSIS — H401131 Primary open-angle glaucoma, bilateral, mild stage: Secondary | ICD-10-CM | POA: Diagnosis not present

## 2015-12-05 DIAGNOSIS — H401131 Primary open-angle glaucoma, bilateral, mild stage: Secondary | ICD-10-CM | POA: Diagnosis not present

## 2016-01-18 ENCOUNTER — Encounter (HOSPITAL_COMMUNITY): Payer: Self-pay | Admitting: Cardiovascular Disease

## 2016-01-18 DIAGNOSIS — N390 Urinary tract infection, site not specified: Secondary | ICD-10-CM | POA: Diagnosis not present

## 2016-01-18 DIAGNOSIS — M810 Age-related osteoporosis without current pathological fracture: Secondary | ICD-10-CM | POA: Diagnosis not present

## 2016-01-18 DIAGNOSIS — I10 Essential (primary) hypertension: Secondary | ICD-10-CM | POA: Diagnosis not present

## 2016-01-18 DIAGNOSIS — E559 Vitamin D deficiency, unspecified: Secondary | ICD-10-CM | POA: Diagnosis not present

## 2016-01-18 DIAGNOSIS — K2951 Unspecified chronic gastritis with bleeding: Secondary | ICD-10-CM | POA: Diagnosis not present

## 2016-01-18 DIAGNOSIS — H3530 Unspecified macular degeneration: Secondary | ICD-10-CM | POA: Diagnosis not present

## 2016-01-18 DIAGNOSIS — E785 Hyperlipidemia, unspecified: Secondary | ICD-10-CM | POA: Diagnosis not present

## 2016-01-18 DIAGNOSIS — G459 Transient cerebral ischemic attack, unspecified: Secondary | ICD-10-CM | POA: Diagnosis not present

## 2016-02-01 ENCOUNTER — Ambulatory Visit (HOSPITAL_COMMUNITY)
Admission: RE | Admit: 2016-02-01 | Discharge: 2016-02-01 | Disposition: A | Discharge status: Home / Self Care | Payer: Medicare Other | Source: Ambulatory Visit | Attending: Cardiology | Admitting: Cardiology

## 2016-02-01 DIAGNOSIS — I6523 Occlusion and stenosis of bilateral carotid arteries: Secondary | ICD-10-CM | POA: Insufficient documentation

## 2016-02-01 DIAGNOSIS — E785 Hyperlipidemia, unspecified: Secondary | ICD-10-CM | POA: Diagnosis not present

## 2016-02-01 DIAGNOSIS — I10 Essential (primary) hypertension: Secondary | ICD-10-CM | POA: Diagnosis not present

## 2016-02-01 DIAGNOSIS — Z8673 Personal history of transient ischemic attack (TIA), and cerebral infarction without residual deficits: Secondary | ICD-10-CM | POA: Diagnosis not present

## 2016-02-01 DIAGNOSIS — R0989 Other specified symptoms and signs involving the circulatory and respiratory systems: Secondary | ICD-10-CM | POA: Diagnosis not present

## 2016-03-08 DIAGNOSIS — M170 Bilateral primary osteoarthritis of knee: Secondary | ICD-10-CM | POA: Diagnosis not present

## 2016-03-08 DIAGNOSIS — G8929 Other chronic pain: Secondary | ICD-10-CM | POA: Diagnosis not present

## 2016-03-08 DIAGNOSIS — M25561 Pain in right knee: Secondary | ICD-10-CM | POA: Diagnosis not present

## 2016-03-08 DIAGNOSIS — M1711 Unilateral primary osteoarthritis, right knee: Secondary | ICD-10-CM | POA: Diagnosis not present

## 2016-04-04 DIAGNOSIS — M170 Bilateral primary osteoarthritis of knee: Secondary | ICD-10-CM | POA: Diagnosis not present

## 2016-04-17 DIAGNOSIS — M170 Bilateral primary osteoarthritis of knee: Secondary | ICD-10-CM | POA: Diagnosis not present

## 2016-04-19 NOTE — Progress Notes (Signed)
CARDIOLOGY OFFICE NOTE  Date:  05/01/2016    [name] Date of Birth: Nov 18, 1940 Medical Record #[id number]  PCP:  [name], MD  Cardiologist:  Eden Emms    Chief Complaint  Patient presents with  . S/P Stroke    History of Present Illness: [name] is a 76 y.o. female who presents today for f/u  She has a history of LBBB, HLD, HTN, carotid disease (40 to 59% LICA from 09/2014 duplex) and depression. Seen by PA  3/17 with elevated BP and concern for TIA with slurred speech and dragging left leg   Sent to ER for w/u Echo fine EF 60-65% MRI with multiple acute infarcts pons, cerebellar peduncle Significant intracranial vascular Disease and bilateral carotid stenosis 70% on left and 70-80% right. 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  Rx with aspirin and plavix with permissive hypertension Amlodipine lasix and losartan stopped Intolerant to statins so on niacin   F/U duplex 02/01/16 suggested lefit ICA 60-79% peak systolic velocity only 2.3 cm/sec and diastolic .22cm/sec  Past Medical History:  Diagnosis Date  . Anemia   . CKD (chronic kidney disease), stage III   . Depression   . GERD (gastroesophageal reflux disease)   . Glaucoma   . HTN (hypertension) 07/12/2011  . Hyperlipidemia   . Stroke [facility])     Past Surgical History:  Procedure Laterality Date  . ABDOMINAL HYSTERECTOMY    . BACK SURGERY    . cataract surgery    . COLONOSCOPY N/A 08/03/2015   Procedure: COLONOSCOPY;  Surgeon: Carman Ching, MD;  Location: [facility] ENDOSCOPY;  Service: Endoscopy;  Laterality: N/A;  . ESOPHAGOGASTRODUODENOSCOPY N/A 07/31/2015   Procedure: ESOPHAGOGASTRODUODENOSCOPY (EGD);  Surgeon: Carman Ching, MD;  Location: [facility] ENDOSCOPY;  Service: Endoscopy;  Laterality: N/A;  . OOPHORECTOMY    . TOTAL HIP ARTHROPLASTY    . URETER SURGERY       Medications: Current Outpatient Prescriptions  Medication Sig Dispense Refill  . aspirin 325 MG  tablet Take 325 mg by mouth daily.    . clopidogrel (PLAVIX) 75 MG tablet Take 1 tablet (75 mg total) by mouth daily. 30 tablet 0  . escitalopram (LEXAPRO) 10 MG tablet Take 10 mg by mouth daily.    . furosemide (LASIX) 20 MG tablet Take 20 mg by mouth. Reported on 10/03/2015    . Loratadine 10 MG CAPS Take 10 mg by mouth daily.    [name] losartan (COZAAR) 100 MG tablet Take 100 mg by mouth as needed (for blood pressure). Reported on 10/03/2015    . pantoprazole (PROTONIX) 40 MG tablet Take 1 tablet (40 mg total) by mouth 2 (two) times daily.     No current facility-administered medications for this visit.     Allergies: Allergies  Allergen Reactions  . Codeine     Nausea   . Meclizine Other (See Comments)    Lethargy, extreme sleepiness  . Requip [Ropinirole Hcl] Nausea And Vomiting  . Statins     Muscle weakness    Social History: The patient  reports that she has never smoked. She has never used smokeless tobacco. She reports that she does not drink alcohol or use drugs.   Family History: The patient's family history includes Diabetes in her brother; Heart attack in her father; Hypertension in her brother, father, and mother.   Review of Systems: Please see the history of present illness.   Otherwise, the review of systems is positive for none.  All other systems are reviewed and negative.   Physical Exam: VS:  BP (!) 150/84   Pulse 70   Ht 5' (1.524 m)   Wt 177 lb (80.3 kg)   SpO2 97%   BMI 34.57 kg/m  .  BMI Body mass index is 34.57 kg/m.  Wt Readings from Last 3 Encounters:  05/01/16 177 lb (80.3 kg)  07/31/15 171 lb 11.8 oz (77.9 kg)  06/11/15 166 lb 11.8 oz (75.6 kg)    General: Pleasant. Elderly female. Looks a little frail. She is alert and in no acute distress. Using a walker.   HEENT: Normal but has obvious mouth droop. Words slightly slurred.  Neck: Supple, no JVD, + bilateral carotid bruits noted.  Cardiac: Regular rate and rhythm. Outflow murmur noted. No  edema.  Respiratory:  Lungs are clear to auscultation bilaterally with normal work of breathing.  GI: Soft and nontender.  MS: No deformity or atrophy. Gait is unsteady. Dragging her left leg.   Skin: Warm and dry. Color is normal.  Neuro:  Strength seems reduced on the left. Left grip weaker than the right. She has a mouth droop. Dragging left foot when trying to walk.  Psych: Alert, appropriate and with normal affect.   LABORATORY DATA:  EKG:  EKG is ordered today. This demonstrates NSR with LBBB.  Lab Results  Component Value Date   WBC 5.5 08/06/2015   HGB 8.8 (L) 08/06/2015   HCT 28.0 (L) 08/06/2015   PLT 238 08/06/2015   GLUCOSE 108 (H) 08/03/2015   CHOL 156 08/01/2015   TRIG 180 (H) 08/01/2015   HDL 23 (L) 08/01/2015   LDLCALC 97 08/01/2015   ALT 12 (L) 08/03/2015   AST 15 08/03/2015   NA 144 08/03/2015   K 3.6 08/03/2015   CL 114 (H) 08/03/2015   CREATININE 0.95 08/03/2015   BUN 12 08/03/2015   CO2 24 08/03/2015   INR 1.22 07/31/2015   HGBA1C 5.6 08/01/2015    BNP (last 3 results) No results for input(s): BNP in the last 8760 hours.  ProBNP (last 3 results) No results for input(s): PROBNP in the last 8760 hours.   Other Studies Reviewed Today:  Echo Study Conclusions from 09/2014  - Left ventricle: The cavity size was normal. Wall thickness was  increased in a pattern of mild LVH. Systolic function was normal.  The estimated ejection fraction was in the range of 60% to 65%.  Doppler parameters are consistent with abnormal left ventricular  relaxation (grade 1 diastolic dysfunction).  Assessment/Plan: 1. CVA:  DAT f/u neurology duplex left carotid in 01/2017   2. 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 HLD intolerant to statins on niacin   4. Chronic LBBB stable no high grade AV block   5. Arthritis:  F/u Charlann Boxer right knee worse has had steroid injections      Charlton Haws

## 2016-04-22 DIAGNOSIS — I6350 Cerebral infarction due to unspecified occlusion or stenosis of unspecified cerebral artery: Secondary | ICD-10-CM | POA: Diagnosis not present

## 2016-04-22 DIAGNOSIS — I10 Essential (primary) hypertension: Secondary | ICD-10-CM | POA: Diagnosis not present

## 2016-04-22 DIAGNOSIS — E782 Mixed hyperlipidemia: Secondary | ICD-10-CM | POA: Diagnosis not present

## 2016-04-22 DIAGNOSIS — G459 Transient cerebral ischemic attack, unspecified: Secondary | ICD-10-CM | POA: Diagnosis not present

## 2016-04-22 DIAGNOSIS — J4520 Mild intermittent asthma, uncomplicated: Secondary | ICD-10-CM | POA: Diagnosis not present

## 2016-04-22 DIAGNOSIS — I67848 Other cerebrovascular vasospasm and vasoconstriction: Secondary | ICD-10-CM | POA: Diagnosis not present

## 2016-04-22 DIAGNOSIS — H409 Unspecified glaucoma: Secondary | ICD-10-CM | POA: Diagnosis not present

## 2016-04-22 DIAGNOSIS — F5101 Primary insomnia: Secondary | ICD-10-CM | POA: Diagnosis not present

## 2016-04-24 DIAGNOSIS — G8929 Other chronic pain: Secondary | ICD-10-CM | POA: Diagnosis not present

## 2016-04-24 DIAGNOSIS — M25562 Pain in left knee: Secondary | ICD-10-CM | POA: Diagnosis not present

## 2016-04-24 DIAGNOSIS — M170 Bilateral primary osteoarthritis of knee: Secondary | ICD-10-CM | POA: Diagnosis not present

## 2016-04-24 DIAGNOSIS — M25561 Pain in right knee: Secondary | ICD-10-CM | POA: Diagnosis not present

## 2016-04-25 ENCOUNTER — Encounter: Payer: Self-pay | Admitting: *Deleted

## 2016-05-01 ENCOUNTER — Ambulatory Visit (INDEPENDENT_AMBULATORY_CARE_PROVIDER_SITE_OTHER): Payer: Self-pay | Admitting: Cardiovascular Disease

## 2016-05-01 ENCOUNTER — Encounter: Payer: Self-pay | Admitting: Cardiovascular Disease

## 2016-05-01 VITALS — BP 150/84 | HR 70 | Ht 60.0 in | Wt 177.0 lb

## 2016-05-01 DIAGNOSIS — R0989 Other specified symptoms and signs involving the circulatory and respiratory systems: Secondary | ICD-10-CM

## 2016-05-01 DIAGNOSIS — I779 Disorder of arteries and arterioles, unspecified: Secondary | ICD-10-CM

## 2016-05-01 DIAGNOSIS — I739 Peripheral vascular disease, unspecified: Secondary | ICD-10-CM

## 2016-05-01 DIAGNOSIS — I10 Essential (primary) hypertension: Secondary | ICD-10-CM

## 2016-05-01 DIAGNOSIS — I447 Left bundle-branch block, unspecified: Secondary | ICD-10-CM

## 2016-05-01 NOTE — Patient Instructions (Addendum)
Medication Instructions:  Your physician recommends that you continue on your current medications as directed. Please refer to the Current Medication list given to you today.  Labwork: NONE  Testing/Procedures: Your physician has requested that you have a carotid duplex in November. This test is an ultrasound of the carotid arteries in your neck. It looks at blood flow through these arteries that supply the brain with blood. Allow one hour for this exam. There are no restrictions or special instructions.  Follow-Up: Your physician wants you to follow-up in: November with Dr. Eden Emms. You will receive a reminder letter in the mail two months in advance. If you don't receive a letter, please call our office to schedule the follow-up appointment.   If you need a refill on your cardiac medications before your next appointment, please call your pharmacy.

## 2016-06-04 DIAGNOSIS — H401131 Primary open-angle glaucoma, bilateral, mild stage: Secondary | ICD-10-CM | POA: Diagnosis not present

## 2016-06-04 DIAGNOSIS — H353132 Nonexudative age-related macular degeneration, bilateral, intermediate dry stage: Secondary | ICD-10-CM | POA: Diagnosis not present

## 2016-07-24 DIAGNOSIS — H409 Unspecified glaucoma: Secondary | ICD-10-CM | POA: Diagnosis not present

## 2016-07-24 DIAGNOSIS — I10 Essential (primary) hypertension: Secondary | ICD-10-CM | POA: Diagnosis not present

## 2016-07-24 DIAGNOSIS — E782 Mixed hyperlipidemia: Secondary | ICD-10-CM | POA: Diagnosis not present

## 2016-07-24 DIAGNOSIS — J4520 Mild intermittent asthma, uncomplicated: Secondary | ICD-10-CM | POA: Diagnosis not present

## 2016-07-24 DIAGNOSIS — I69393 Ataxia following cerebral infarction: Secondary | ICD-10-CM | POA: Diagnosis not present

## 2016-09-03 DIAGNOSIS — H8110 Benign paroxysmal vertigo, unspecified ear: Secondary | ICD-10-CM | POA: Diagnosis not present

## 2016-09-03 DIAGNOSIS — I10 Essential (primary) hypertension: Secondary | ICD-10-CM | POA: Diagnosis not present

## 2016-10-25 DIAGNOSIS — J4520 Mild intermittent asthma, uncomplicated: Secondary | ICD-10-CM | POA: Diagnosis not present

## 2016-10-25 DIAGNOSIS — E782 Mixed hyperlipidemia: Secondary | ICD-10-CM | POA: Diagnosis not present

## 2016-10-25 DIAGNOSIS — H409 Unspecified glaucoma: Secondary | ICD-10-CM | POA: Diagnosis not present

## 2016-10-25 DIAGNOSIS — I10 Essential (primary) hypertension: Secondary | ICD-10-CM | POA: Diagnosis not present

## 2016-10-25 DIAGNOSIS — I69393 Ataxia following cerebral infarction: Secondary | ICD-10-CM | POA: Diagnosis not present

## 2017-01-06 DIAGNOSIS — H401131 Primary open-angle glaucoma, bilateral, mild stage: Secondary | ICD-10-CM | POA: Diagnosis not present

## 2017-01-20 DIAGNOSIS — H401131 Primary open-angle glaucoma, bilateral, mild stage: Secondary | ICD-10-CM | POA: Diagnosis not present

## 2017-02-12 ENCOUNTER — Encounter (HOSPITAL_COMMUNITY): Payer: Medicare Other

## 2017-02-27 ENCOUNTER — Ambulatory Visit (HOSPITAL_COMMUNITY)
Admission: RE | Admit: 2017-02-27 | Discharge: 2017-02-27 | Disposition: A | Discharge status: Home / Self Care | Payer: Medicare Other | Source: Ambulatory Visit | Attending: Cardiology | Admitting: Cardiology

## 2017-02-27 DIAGNOSIS — R0989 Other specified symptoms and signs involving the circulatory and respiratory systems: Secondary | ICD-10-CM | POA: Diagnosis not present

## 2017-02-27 DIAGNOSIS — I779 Disorder of arteries and arterioles, unspecified: Secondary | ICD-10-CM | POA: Diagnosis not present

## 2017-02-27 DIAGNOSIS — I6523 Occlusion and stenosis of bilateral carotid arteries: Secondary | ICD-10-CM | POA: Insufficient documentation

## 2017-02-27 DIAGNOSIS — I739 Peripheral vascular disease, unspecified: Secondary | ICD-10-CM

## 2017-03-05 ENCOUNTER — Telehealth: Payer: Self-pay

## 2017-03-05 DIAGNOSIS — R0989 Other specified symptoms and signs involving the circulatory and respiratory systems: Secondary | ICD-10-CM

## 2017-03-05 DIAGNOSIS — I6522 Occlusion and stenosis of left carotid artery: Secondary | ICD-10-CM

## 2017-03-05 NOTE — Telephone Encounter (Signed)
Will put in order for carotid for one year.

## 2017-03-05 NOTE — Telephone Encounter (Signed)
-----   Message from Wendall Stade, MD sent at 03/02/2017  5:34 PM EST ----- 40-59% LICA stenosis.  F/U duplex in one year

## 2017-05-13 NOTE — Progress Notes (Signed)
CARDIOLOGY OFFICE NOTE  Date:  05/22/2017    [name] Date of Birth: 06/30/1940 Medical Record #[id number]  PCP:  [name], MD  Cardiologist:  Eden Emms    No chief complaint on file.   History of Present Illness:  77 y.o. History of HTN, LBBB, HLD. 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 Echo with EF 60-65% MRI with multiple infarcts in pons, cerebellar peduncle and significant intracranial vascular disease. Neuro suggested Permissive HTN and ASA/Plavix. 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 Intolerant to statins And takes niacin. Carotids have been variable but most recent duplex 02/27/17 only 40-59% LICA stenosis   Still with difficulty walking due to stroke and bad arthritic knees   Past Medical History:  Diagnosis Date  . Anemia   . CKD (chronic kidney disease), stage III (HCC)   . Depression   . GERD (gastroesophageal reflux disease)   . Glaucoma   . HTN (hypertension) 07/12/2011  . Hyperlipidemia   . Stroke [facility])     Past Surgical History:  Procedure Laterality Date  . ABDOMINAL HYSTERECTOMY    . BACK SURGERY    . cataract surgery    . COLONOSCOPY N/A 08/03/2015   Procedure: COLONOSCOPY;  Surgeon: Carman Ching, MD;  Location: [facility] ENDOSCOPY;  Service: Endoscopy;  Laterality: N/A;  . ESOPHAGOGASTRODUODENOSCOPY N/A 07/31/2015   Procedure: ESOPHAGOGASTRODUODENOSCOPY (EGD);  Surgeon: Carman Ching, MD;  Location[facility] ENDOSCOPY;  Service: Endoscopy;  Laterality: N/A;  . OOPHORECTOMY    . TOTAL HIP ARTHROPLASTY    . URETER SURGERY       Medications: Current Outpatient Medications  Medication Sig Dispense Refill  . aspirin 325 MG tablet Take 325 mg by mouth daily.    . clopidogrel (PLAVIX) 75 MG tablet Take 1 tablet (75 mg total) by mouth daily. 30 tablet 0  . escitalopram (LEXAPRO) 10 MG tablet Take 10 mg by mouth daily.    . furosemide (LASIX) 20 MG tablet Take 20 mg by mouth daily as needed for  fluid.    . Loratadine 10 MG CAPS Take 10 mg by mouth daily.    [name] losartan (COZAAR) 100 MG tablet Take 100 mg by mouth as needed (for blood pressure). Reported on 10/03/2015    . pantoprazole (PROTONIX) 40 MG tablet Take 1 tablet (40 mg total) by mouth 2 (two) times daily.     No current facility-administered medications for this visit.     Allergies: Allergies  Allergen Reactions  . Codeine     Nausea   . Meclizine Other (See Comments)    Lethargy, extreme sleepiness  . Requip [Ropinirole Hcl] Nausea And Vomiting  . Statins     Muscle weakness    Social History: The patient  reports that  has never smoked. she has never used smokeless tobacco. She reports that she does not drink alcohol or use drugs.   Family History: The patient's family history includes Diabetes in her brother; Heart attack in her father; Hypertension in her brother, father, and mother.   Review of Systems: Please see the history of present illness.   Otherwise, the review of systems is positive for none.   All other systems are reviewed and negative.   Physical Exam: VS:  BP (!) 158/102 Comment: patient did not take bp meds this morning  Pulse (!) 44   Ht 5' (1.524 m)   Wt 179 lb 8 oz (81.4 kg)   SpO2 99%  BMI 35.06 kg/m  .  BMI Body mass index is 35.06 kg/m.  Wt Readings from Last 3 Encounters:  05/22/17 179 lb 8 oz (81.4 kg)  05/01/16 177 lb (80.3 kg)  07/31/15 171 lb 11.8 oz (77.9 kg)    Affect appropriate Healthy:  appears stated age HEENT: normal Neck supple with no adenopathy JVP normal bilateral carotid  bruits no thyromegaly Lungs clear with no wheezing and good diaphragmatic motion Heart:  S1/S2  SEM murmur, no rub, gallop or click PMI normal Abdomen: benighn, BS positve, no tenderness, no AAA no bruit.  No HSM or HJR Distal pulses intact with no bruits No edema Neuro non-focal Skin warm and dry No muscular weakness    LABORATORY DATA:  EKG: 08/02/15   NSR with  LBBB.  Lab Results  Component Value Date   WBC 5.5 08/06/2015   HGB 8.8 (L) 08/06/2015   HCT 28.0 (L) 08/06/2015   PLT 238 08/06/2015   GLUCOSE 108 (H) 08/03/2015   CHOL 156 08/01/2015   TRIG 180 (H) 08/01/2015   HDL 23 (L) 08/01/2015   LDLCALC 97 08/01/2015   ALT 12 (L) 08/03/2015   AST 15 08/03/2015   NA 144 08/03/2015   K 3.6 08/03/2015   CL 114 (H) 08/03/2015   CREATININE 0.95 08/03/2015   BUN 12 08/03/2015   CO2 24 08/03/2015   INR 1.22 07/31/2015   HGBA1C 5.6 08/01/2015    BNP (last 3 results) No results for input(s): BNP in the last 8760 hours.  ProBNP (last 3 results) No results for input(s): PROBNP in the last 8760 hours.   Other Studies Reviewed Today:  Echo Study Conclusions from 09/2014  - Left ventricle: The cavity size was normal. Wall thickness was  increased in a pattern of mild LVH. Systolic function was normal.  The estimated ejection fraction was in the range of 60% to 65%.  Doppler parameters are consistent with abnormal left ventricular  relaxation (grade 1 diastolic dysfunction).  Assessment/Plan: 1. CVA:  DAT f/u neurology duplex left carotid 79/1/50 40-59% LICA f/u in a year   2. 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 HLD intolerant to statins on niacin   4. Chronic LBBB stable no high grade AV block   5. Murmur:  AV sclerosis no need for echo at this time has had for years history of rheumatic fever age 65    Charlton Haws

## 2017-05-22 ENCOUNTER — Ambulatory Visit (INDEPENDENT_AMBULATORY_CARE_PROVIDER_SITE_OTHER): Payer: Medicare Other | Admitting: Cardiovascular Disease

## 2017-05-22 ENCOUNTER — Encounter: Payer: Self-pay | Admitting: Cardiovascular Disease

## 2017-05-22 VITALS — BP 158/102 | HR 44 | Ht 60.0 in | Wt 179.5 lb

## 2017-05-22 DIAGNOSIS — E785 Hyperlipidemia, unspecified: Secondary | ICD-10-CM

## 2017-05-22 DIAGNOSIS — I10 Essential (primary) hypertension: Secondary | ICD-10-CM

## 2017-05-22 DIAGNOSIS — I6522 Occlusion and stenosis of left carotid artery: Secondary | ICD-10-CM | POA: Diagnosis not present

## 2017-05-22 DIAGNOSIS — Z8673 Personal history of transient ischemic attack (TIA), and cerebral infarction without residual deficits: Secondary | ICD-10-CM

## 2017-05-22 NOTE — Patient Instructions (Addendum)
Medication Instructions:  Your physician recommends that you continue on your current medications as directed. Please refer to the Current Medication list given to you today.  Labwork: NONE  Testing/Procedures: Your physician has requested that you have a carotid duplex in 1 year same day as office visit. This test is an ultrasound of the carotid arteries in your neck. It looks at blood flow through these arteries that supply the brain with blood. Allow one hour for this exam. There are no restrictions or special instructions.  Follow-Up: Your physician wants you to follow-up in: 12 months with Dr. Eden Emms. You will receive a reminder letter in the mail two months in advance. If you don't receive a letter, please call our office to schedule the follow-up appointment.   If you need a refill on your cardiac medications before your next appointment, please call your pharmacy.

## 2017-07-21 DIAGNOSIS — H401131 Primary open-angle glaucoma, bilateral, mild stage: Secondary | ICD-10-CM | POA: Diagnosis not present

## 2017-11-12 DIAGNOSIS — H401131 Primary open-angle glaucoma, bilateral, mild stage: Secondary | ICD-10-CM | POA: Diagnosis not present

## 2017-12-16 DIAGNOSIS — H401131 Primary open-angle glaucoma, bilateral, mild stage: Secondary | ICD-10-CM | POA: Diagnosis not present

## 2018-03-04 ENCOUNTER — Ambulatory Visit (HOSPITAL_COMMUNITY)
Admission: RE | Admit: 2018-03-04 | Discharge: 2018-03-04 | Disposition: A | Discharge status: Home / Self Care | Payer: Medicare Other | Source: Ambulatory Visit | Attending: Cardiovascular Disease | Admitting: Cardiovascular Disease

## 2018-03-04 DIAGNOSIS — R0989 Other specified symptoms and signs involving the circulatory and respiratory systems: Secondary | ICD-10-CM

## 2018-03-04 DIAGNOSIS — I6522 Occlusion and stenosis of left carotid artery: Secondary | ICD-10-CM

## 2018-03-13 ENCOUNTER — Telehealth: Payer: Self-pay

## 2018-03-13 DIAGNOSIS — I6522 Occlusion and stenosis of left carotid artery: Secondary | ICD-10-CM

## 2018-03-13 NOTE — Telephone Encounter (Addendum)
Will send letter for patient to call office. Tried to contact patient 3 times about her results. Will place order for carotid for one year.

## 2018-03-13 NOTE — Telephone Encounter (Signed)
-----   Message from Wendall Stade, MD sent at 03/04/2018 12:39 PM EST ----- 40-59% LICA stenosis.  F/U duplex in one year

## 2018-03-23 ENCOUNTER — Telehealth: Payer: Self-pay | Admitting: Cardiovascular Disease

## 2018-03-23 NOTE — Telephone Encounter (Signed)
° ° °  Patient returning call for results of carotid. Please call

## 2018-03-23 NOTE — Telephone Encounter (Signed)
Tried to call patient, but phone rang busy. Will try again later.   Notes recorded by Wendall Stade, MD on 03/04/2018 at 12:39 PM EST 40-59% LICA stenosis. F/U duplex in one year

## 2018-03-27 NOTE — Telephone Encounter (Signed)
Left message for patient to call back  

## 2018-04-09 DIAGNOSIS — H401131 Primary open-angle glaucoma, bilateral, mild stage: Secondary | ICD-10-CM | POA: Diagnosis not present

## 2018-04-13 NOTE — Telephone Encounter (Signed)
Await for patient to return call.

## 2018-04-16 DIAGNOSIS — H401131 Primary open-angle glaucoma, bilateral, mild stage: Secondary | ICD-10-CM | POA: Diagnosis not present

## 2018-05-07 ENCOUNTER — Encounter: Payer: Self-pay | Admitting: Cardiovascular Disease

## 2018-05-19 NOTE — Progress Notes (Signed)
CARDIOLOGY OFFICE NOTE  Date:  05/25/2018    [name] Date of Birth: [birth year] Medical Record #[id number]  PCP:  [name], MD  Cardiologist:  Eden Emms    No chief complaint on file.   History of Present Illness:  78 y.o. History of HTN, LBBB, HLD. 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 Echo with EF 60-65% MRI with multiple infarcts in pons, cerebellar peduncle and significant intracranial vascular disease. Neuro suggested Permissive HTN and ASA/Plavix. 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 Intolerant to statins And was on niacin. Carotids have been variable but most recent duplex 03/04/18  only 40-59% LICA stenosis   Still with difficulty walking due to stroke and bad arthritic knees   Past Medical History:  Diagnosis Date  . Anemia   . CKD (chronic kidney disease), stage III (HCC)   . Depression   . GERD (gastroesophageal reflux disease)   . Glaucoma   . HTN (hypertension) 07/12/2011  . Hyperlipidemia   . Stroke [facility])     Past Surgical History:  Procedure Laterality Date  . ABDOMINAL HYSTERECTOMY    . BACK SURGERY    . cataract surgery    . COLONOSCOPY N/A 08/03/2015   Procedure: COLONOSCOPY;  Surgeon: Carman Ching, MD;  Location: [facility] ENDOSCOPY;  Service: Endoscopy;  Laterality: N/A;  . ESOPHAGOGASTRODUODENOSCOPY N/A 07/31/2015   Procedure: ESOPHAGOGASTRODUODENOSCOPY (EGD);  Surgeon: Carman Ching, MD;  Location: [facility] ENDOSCOPY;  Service: Endoscopy;  Laterality: N/A;  . OOPHORECTOMY    . TOTAL HIP ARTHROPLASTY    . URETER SURGERY       Medications: Current Outpatient Medications  Medication Sig Dispense Refill  . aspirin 325 MG tablet Take 325 mg by mouth daily.    . clopidogrel (PLAVIX) 75 MG tablet Take 1 tablet (75 mg total) by mouth daily. 30 tablet 0  . escitalopram (LEXAPRO) 10 MG tablet Take 10 mg by mouth daily.    . furosemide (LASIX) 20 MG tablet Take 20 mg by mouth daily as needed for  fluid.    . Loratadine 10 MG CAPS Take 10 mg by mouth daily.    [name] losartan (COZAAR) 100 MG tablet Take 100 mg by mouth as needed (for blood pressure). Reported on 10/03/2015    . pantoprazole (PROTONIX) 40 MG tablet Take 1 tablet (40 mg total) by mouth 2 (two) times daily.    . sertraline (ZOLOFT) 25 MG tablet Take 25 mg by mouth daily.     No current facility-administered medications for this visit.     Allergies: Allergies  Allergen Reactions  . Codeine     Nausea   . Meclizine Other (See Comments)    Lethargy, extreme sleepiness  . Requip [Ropinirole Hcl] Nausea And Vomiting  . Statins     Muscle weakness    Social History: The patient  reports that she has never smoked. She has never used smokeless tobacco. She reports that she does not drink alcohol or use drugs.   Family History: The patient's family history includes Diabetes in her brother; Heart attack in her father; Hypertension in her brother, father, and mother.   Review of Systems: Please see the history of present illness.   Otherwise, the review of systems is positive for none.   All other systems are reviewed and negative.   Physical Exam: VS:  BP 130/78   Pulse 75   Ht 5' (1.524 m)   Wt 78.5 kg   SpO2  94%   BMI 33.79 kg/m  .  BMI Body mass index is 33.79 kg/m.  Wt Readings from Last 3 Encounters:  05/25/18 78.5 kg  05/22/17 81.4 kg  05/01/16 80.3 kg    Affect appropriate Healthy:  appears stated age HEENT: normal Neck supple with no adenopathy JVP normal bilateral carotid  Bruits high pitched on left  no thyromegaly Lungs clear with no wheezing and good diaphragmatic motion Heart:  S1/S2  SEM murmur, no rub, gallop or click PMI normal Abdomen: benighn, BS positve, no tenderness, no AAA no bruit.  No HSM or HJR Distal pulses intact with no bruits No edema Neuro non-focal Skin warm and dry No muscular weakness    LABORATORY DATA:  EKG: 08/02/15   NSR with LBBB. 05/25/18 SR rate 75 LBBB LAD  no changes   Lab Results  Component Value Date   WBC 5.5 08/06/2015   HGB 8.8 (L) 08/06/2015   HCT 28.0 (L) 08/06/2015   PLT 238 08/06/2015   GLUCOSE 108 (H) 08/03/2015   CHOL 156 08/01/2015   TRIG 180 (H) 08/01/2015   HDL 23 (L) 08/01/2015   LDLCALC 97 08/01/2015   ALT 12 (L) 08/03/2015   AST 15 08/03/2015   NA 144 08/03/2015   K 3.6 08/03/2015   CL 114 (H) 08/03/2015   CREATININE 0.95 08/03/2015   BUN 12 08/03/2015   CO2 24 08/03/2015   INR 1.22 07/31/2015   HGBA1C 5.6 08/01/2015    BNP (last 3 results) No results for input(s): BNP in the last 8760 hours.  ProBNP (last 3 results) No results for input(s): PROBNP in the last 8760 hours.   Other Studies Reviewed Today:  Echo Study Conclusions from 09/2014  - Left ventricle: The cavity size was normal. Wall thickness was  increased in a pattern of mild LVH. Systolic function was normal.  The estimated ejection fraction was in the range of 60% to 65%.  Doppler parameters are consistent with abnormal left ventricular  relaxation (grade 1 diastolic dysfunction).  Assessment/Plan: 1. CVA:  DAT f/u neurology duplex left carotid 03/04/18  40-59% LICA f/u in a year   2. 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 HLD intolerant to statins was on niacin   4. Chronic LBBB stable no high grade AV block   5. 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

## 2018-05-25 ENCOUNTER — Encounter: Payer: Self-pay | Admitting: Cardiovascular Disease

## 2018-05-25 ENCOUNTER — Ambulatory Visit (INDEPENDENT_AMBULATORY_CARE_PROVIDER_SITE_OTHER): Payer: Medicare Other | Admitting: Cardiovascular Disease

## 2018-05-25 VITALS — BP 130/78 | HR 75 | Ht 60.0 in | Wt 173.0 lb

## 2018-05-25 DIAGNOSIS — I10 Essential (primary) hypertension: Secondary | ICD-10-CM

## 2018-05-25 DIAGNOSIS — E785 Hyperlipidemia, unspecified: Secondary | ICD-10-CM

## 2018-05-25 DIAGNOSIS — R0989 Other specified symptoms and signs involving the circulatory and respiratory systems: Secondary | ICD-10-CM | POA: Diagnosis not present

## 2018-05-25 DIAGNOSIS — R011 Cardiac murmur, unspecified: Secondary | ICD-10-CM

## 2018-05-25 DIAGNOSIS — I447 Left bundle-branch block, unspecified: Secondary | ICD-10-CM

## 2018-05-25 DIAGNOSIS — Z8673 Personal history of transient ischemic attack (TIA), and cerebral infarction without residual deficits: Secondary | ICD-10-CM | POA: Diagnosis not present

## 2018-05-25 NOTE — Patient Instructions (Addendum)
Medication Instructions:   If you need a refill on your cardiac medications before your next appointment, please call your pharmacy.   Lab work:  If you have labs (blood work) drawn today and your tests are completely normal, you will receive your results only by: [name] MyChart Message (if you have MyChart) OR . A paper copy in the mail If you have any lab test that is abnormal or we need to change your treatment, we will call you to review the results.  Testing/Procedures: Your physician has requested that you have an echocardiogram in one year. Echocardiography is a painless test that uses sound waves to create images of your heart. It provides your doctor with information about the size and shape of your heart and how well your heart's chambers and valves are working. This procedure takes approximately one hour. There are no restrictions for this procedure.  Your physician has requested that you have a carotid duplex in one year. This test is an ultrasound of the carotid arteries in your neck. It looks at blood flow through these arteries that supply the brain with blood. Allow one hour for this exam. There are no restrictions or special instructions.  Follow-Up: At [facility], you and your health needs are our priority.  As part of our continuing mission to provide you with exceptional heart care, we have created designated Provider Care Teams.  These Care Teams include your primary Cardiologist (physician) and Advanced Practice Providers (APPs -  Physician Assistants and Nurse Practitioners) who all work together to provide you with the care you need, when you need it. You will need a follow up appointment in 12 months.  Please call our office 2 months in advance to schedule this appointment.  You may see Dr. Eden Emms or one of the following Advanced Practice Providers on your designated Care Team:   Norma Fredrickson, NP Nada Boozer, NP . Georgie Chard, NP

## 2018-10-14 DIAGNOSIS — H401131 Primary open-angle glaucoma, bilateral, mild stage: Secondary | ICD-10-CM | POA: Diagnosis not present

## 2018-12-09 DIAGNOSIS — H3530 Unspecified macular degeneration: Secondary | ICD-10-CM | POA: Diagnosis not present

## 2018-12-09 DIAGNOSIS — H353131 Nonexudative age-related macular degeneration, bilateral, early dry stage: Secondary | ICD-10-CM | POA: Diagnosis not present

## 2019-05-13 ENCOUNTER — Telehealth: Payer: Self-pay

## 2019-05-13 NOTE — Telephone Encounter (Signed)
Left a message for patient to call the office to schedule chronic appointment and AWV.  Overdue for both.

## 2019-05-13 NOTE — Telephone Encounter (Signed)
Scheduled patient for 05/26/2019 for chronic f/u and AWV with Dr Marina Goodell on 06/08/2019.  Thank you

## 2019-05-25 ENCOUNTER — Ambulatory Visit (HOSPITAL_BASED_OUTPATIENT_CLINIC_OR_DEPARTMENT_OTHER): Payer: Medicare HMO

## 2019-05-25 ENCOUNTER — Ambulatory Visit (HOSPITAL_COMMUNITY)
Admission: RE | Admit: 2019-05-25 | Discharge: 2019-05-25 | Disposition: A | Discharge status: Home / Self Care | Payer: Medicare HMO | Source: Ambulatory Visit | Attending: Cardiology | Admitting: Cardiology

## 2019-05-25 ENCOUNTER — Other Ambulatory Visit: Payer: Self-pay

## 2019-05-25 DIAGNOSIS — R0989 Other specified symptoms and signs involving the circulatory and respiratory systems: Secondary | ICD-10-CM | POA: Insufficient documentation

## 2019-05-25 DIAGNOSIS — R011 Cardiac murmur, unspecified: Secondary | ICD-10-CM | POA: Diagnosis not present

## 2019-05-25 DIAGNOSIS — F5101 Primary insomnia: Secondary | ICD-10-CM | POA: Insufficient documentation

## 2019-05-25 DIAGNOSIS — I69393 Ataxia following cerebral infarction: Secondary | ICD-10-CM | POA: Insufficient documentation

## 2019-05-26 ENCOUNTER — Encounter: Payer: Self-pay | Admitting: Legal Medicine

## 2019-05-26 ENCOUNTER — Ambulatory Visit (INDEPENDENT_AMBULATORY_CARE_PROVIDER_SITE_OTHER): Payer: Medicare HMO | Admitting: Legal Medicine

## 2019-05-26 VITALS — BP 174/100 | HR 96 | Temp 98.2°F | Resp 17 | Ht 60.0 in | Wt 173.4 lb

## 2019-05-26 DIAGNOSIS — F5101 Primary insomnia: Secondary | ICD-10-CM | POA: Diagnosis not present

## 2019-05-26 DIAGNOSIS — I10 Essential (primary) hypertension: Secondary | ICD-10-CM | POA: Diagnosis not present

## 2019-05-26 DIAGNOSIS — R69 Illness, unspecified: Secondary | ICD-10-CM | POA: Diagnosis not present

## 2019-05-26 DIAGNOSIS — N1831 Chronic kidney disease, stage 3a: Secondary | ICD-10-CM | POA: Diagnosis not present

## 2019-05-26 LAB — CBC WITH DIFFERENTIAL/PLATELET
Basophils Absolute: 0 10*3/uL (ref 0.0–0.2)
Basos: 1 %
EOS (ABSOLUTE): 0.2 10*3/uL (ref 0.0–0.4)
Eos: 3 %
Hematocrit: 40.8 % (ref 34.0–46.6)
Hemoglobin: 13.4 g/dL (ref 11.1–15.9)
Immature Grans (Abs): 0 10*3/uL (ref 0.0–0.1)
Immature Granulocytes: 0 %
Lymphocytes Absolute: 1.5 10*3/uL (ref 0.7–3.1)
Lymphs: 27 %
MCH: 30.5 pg (ref 26.6–33.0)
MCHC: 32.8 g/dL (ref 31.5–35.7)
MCV: 93 fL (ref 79–97)
Monocytes Absolute: 0.6 10*3/uL (ref 0.1–0.9)
Monocytes: 10 %
Neutrophils Absolute: 3.4 10*3/uL (ref 1.4–7.0)
Neutrophils: 59 %
Platelets: 274 10*3/uL (ref 150–450)
RBC: 4.39 x10E6/uL (ref 3.77–5.28)
RDW: 12.6 % (ref 11.7–15.4)
WBC: 5.8 10*3/uL (ref 3.4–10.8)

## 2019-05-26 LAB — COMPREHENSIVE METABOLIC PANEL
ALT: 10 IU/L (ref 0–32)
AST: 12 IU/L (ref 0–40)
Albumin/Globulin Ratio: 1.6 (ref 1.2–2.2)
Albumin: 4.2 g/dL (ref 3.7–4.7)
Alkaline Phosphatase: 131 IU/L — ABNORMAL HIGH (ref 39–117)
BUN/Creatinine Ratio: 20 (ref 12–28)
BUN: 24 mg/dL (ref 8–27)
Bilirubin Total: 0.3 mg/dL (ref 0.0–1.2)
CO2: 20 mmol/L (ref 20–29)
Calcium: 10.1 mg/dL (ref 8.7–10.3)
Chloride: 105 mmol/L (ref 96–106)
Creatinine, Ser: 1.23 mg/dL — ABNORMAL HIGH (ref 0.57–1.00)
GFR calc Af Amer: 49 mL/min/{1.73_m2} — ABNORMAL LOW (ref >59)
GFR calc non Af Amer: 42 mL/min/{1.73_m2} — ABNORMAL LOW (ref >59)
Globulin, Total: 2.7 g/dL (ref 1.5–4.5)
Glucose: 99 mg/dL (ref 65–99)
Potassium: 4.2 mmol/L (ref 3.5–5.2)
Sodium: 141 mmol/L (ref 134–144)
Total Protein: 6.9 g/dL (ref 6.0–8.5)

## 2019-05-26 MED ORDER — VALSARTAN 320 MG PO TABS: 320.0000 mg | ORAL_TABLET | Freq: Every day | ORAL | 2 refills | Status: DC

## 2019-05-26 MED ORDER — SERTRALINE HCL 25 MG PO TABS: 50.0000 mg | ORAL_TABLET | Freq: Every day | ORAL | 2 refills | Status: DC

## 2019-05-26 NOTE — Assessment & Plan Note (Signed)
An individual care plan was established and reinforced today.  The patient's status was assessed using clinical findings on exam and labs or diagnostic tests. The patient's success at meeting treatment goals on disease specific evidence-based guidelines and found to be poor controlled. SELF MANAGEMENT: The patient and I together assessed ways to personally work towards obtaining the recommended goals. RECOMMENDATIONS: avoid decongestants found in common cold remedies, decrease consumption of alcohol, perform routine monitoring of BP with home BP cuff, exercise, reduction of dietary salt, take medicines as prescribed, try not to miss doses and quit smoking.  Regular exercise and maintaining a healthy weight is needed.  Stress reduction may help. A CLINICAL SUMMARY including written plan identify barriers to care unique to individual due to social or financial issues.  We attempt to mutually creat solutions for individual and family understanding. 

## 2019-05-26 NOTE — Progress Notes (Signed)
Established Patient Office Visit  Subjective:  Patient ID: [name], female    DOB: Feb 08, 1941  Age: 79 y.o. MRN: [id number]  CC:  Chief Complaint  Patient presents with  . Hypertension    HPI CAMBREY LUPI presents for hypertenson. BP is rising lately.  Her brother is in the hospital.  Patient presents for follow up of hypertension.  Patient tolerating losartan, amlodipine well with side effects.  Patient was diagnosed with hypertension 1979 so has been treated for hypertension for >50 years.Patient is working on maintaining diet and exercise regimen and follows up as directed. Complication include stroke..Patient Is very stressed.  Past Medical History:  Diagnosis Date  . Anemia   . CKD (chronic kidney disease), stage III   . Depression   . GERD (gastroesophageal reflux disease)   . Glaucoma   . HTN (hypertension) 07/12/2011  . Hyperlipidemia   . Stroke [facility])     Past Surgical History:  Procedure Laterality Date  . ABDOMINAL HYSTERECTOMY    . BACK SURGERY    . cataract surgery    . COLONOSCOPY N/A 08/03/2015   Procedure: COLONOSCOPY;  Surgeon: Carman Ching, MD;  Location: [facility] ENDOSCOPY;  Service: Endoscopy;  Laterality: N/A;  . ESOPHAGOGASTRODUODENOSCOPY N/A 07/31/2015   Procedure: ESOPHAGOGASTRODUODENOSCOPY (EGD);  Surgeon: Carman Ching, MD;  Location: [facility] ENDOSCOPY;  Service: Endoscopy;  Laterality: N/A;  . OOPHORECTOMY    . TOTAL HIP ARTHROPLASTY    . URETER SURGERY      Family History  Problem Relation Age of Onset  . Hypertension Mother   . Hypertension Father   . Heart attack Father   . Hypertension Brother   . Diabetes Brother     Social History   Socioeconomic History  . Marital status: Married    Spouse name: Not on file  . Number of children: Not on file  . Years of education: Not on file  . Highest education level: Not on file  Occupational History  . Not on file  Tobacco Use  . Smoking status: Never Smoker  . Smokeless tobacco: Never  Used  Substance and Sexual Activity  . Alcohol use: No  . Drug use: No  . Sexual activity: Not Currently  Other Topics Concern  . Not on file  Social History Narrative  . Not on file   Social Determinants of Health   Financial Resource Strain:   . Difficulty of Paying Living Expenses: Not on file  Food Insecurity:   . Worried About Programme researcher, broadcasting/film/video in the Last Year: Not on file  . Ran Out of Food in the Last Year: Not on file  Transportation Needs:   . Lack of Transportation (Medical): Not on file  . Lack of Transportation (Non-Medical): Not on file  Physical Activity:   . Days of Exercise per Week: Not on file  . Minutes of Exercise per Session: Not on file  Stress:   . Feeling of Stress : Not on file  Social Connections:   . Frequency of Communication with Friends and Family: Not on file  . Frequency of Social Gatherings with Friends and Family: Not on file  . Attends Religious Services: Not on file  . Active Member of Clubs or Organizations: Not on file  . Attends Banker Meetings: Not on file  . Marital Status: Not on file  Intimate Partner Violence:   . Fear of Current or Ex-Partner: Not on file  . Emotionally Abused: Not on file  .  Physically Abused: Not on file  . Sexually Abused: Not on file    Outpatient Medications Prior to Visit  Medication Sig Dispense Refill  . amLODipine (NORVASC) 10 MG tablet Take 10 mg by mouth daily.    [name] aspirin 325 MG tablet Take 325 mg by mouth daily.    . clopidogrel (PLAVIX) 75 MG tablet Take 1 tablet (75 mg total) by mouth daily. 30 tablet 0  . furosemide (LASIX) 20 MG tablet Take 20 mg by mouth daily as needed for fluid.    [name] latanoprost (XALATAN) 0.005 % ophthalmic solution 1 drop at bedtime.    . Loratadine 10 MG CAPS Take 10 mg by mouth daily.    . pantoprazole (PROTONIX) 40 MG tablet Take 1 tablet (40 mg total) by mouth 2 (two) times daily.    [name] losartan (COZAAR) 100 MG tablet Take 100 mg by mouth as needed  (for blood pressure). Reported on 10/03/2015    . sertraline (ZOLOFT) 25 MG tablet Take 25 mg by mouth daily.    . valsartan (DIOVAN) 320 MG tablet Take 320 mg by mouth daily.    [name] escitalopram (LEXAPRO) 10 MG tablet Take 10 mg by mouth daily.     No facility-administered medications prior to visit.    Allergies  Allergen Reactions  . Codeine Nausea And Vomiting    Nausea   . Meclizine Other (See Comments)    Lethargy, extreme sleepiness  . Requip [Ropinirole Hcl] Nausea And Vomiting  . Statins     Muscle weakness    ROS Review of Systems  Constitutional: Negative.   HENT: Negative.   Respiratory: Negative.   Cardiovascular: Negative.   Gastrointestinal: Negative.   Endocrine: Negative.   Genitourinary: Negative.   Musculoskeletal: Negative.   Skin: Negative.   Neurological: Negative.   Psychiatric/Behavioral: The patient is nervous/anxious.       Objective:    Physical Exam  Constitutional: She is oriented to person, place, and time. She appears well-developed and well-nourished.  HENT:  Head: Normocephalic and atraumatic.  Eyes: Pupils are equal, round, and reactive to light. Conjunctivae are normal.  Cardiovascular: Normal rate, regular rhythm and normal heart sounds.  Pulmonary/Chest: Effort normal and breath sounds normal.  Abdominal: Soft. Bowel sounds are normal.  Musculoskeletal:        General: Normal range of motion.     Cervical back: Normal range of motion and neck supple.  Neurological: She is alert and oriented to person, place, and time. She has normal reflexes.  Skin: Skin is warm and dry.  Psychiatric: She has a normal mood and affect.  Vitals reviewed.   BP (!) 174/100 (BP Location: Right Arm, Patient Position: Sitting)   Pulse 96   Temp 98.2 F (36.8 C) (Oral)   Resp 17   Ht 5' (1.524 m)   Wt 173 lb 6.4 oz (78.7 kg)   BMI 33.86 kg/m  Wt Readings from Last 3 Encounters:  05/26/19 173 lb 6.4 oz (78.7 kg)  05/25/18 173 lb (78.5 kg)   05/22/17 179 lb 8 oz (81.4 kg)     Health Maintenance Due  Topic Date Due  . TETANUS/TDAP  08/09/1959  . DEXA SCAN  08/08/2005  . PNA vac Low Risk Adult (1 of 2 - PCV13) 08/08/2005  . INFLUENZA VACCINE  10/24/2018    There are no preventive care reminders to display for this patient.  No results found for: TSH Lab Results  Component Value Date   WBC 5.5  08/06/2015   HGB 8.8 (L) 08/06/2015   HCT 28.0 (L) 08/06/2015   MCV 92.4 08/06/2015   PLT 238 08/06/2015   Lab Results  Component Value Date   NA 144 08/03/2015   K 3.6 08/03/2015   CO2 24 08/03/2015   GLUCOSE 108 (H) 08/03/2015   BUN 12 08/03/2015   CREATININE 0.95 08/03/2015   BILITOT 1.0 08/03/2015   ALKPHOS 75 08/03/2015   AST 15 08/03/2015   ALT 12 (L) 08/03/2015   PROT 4.9 (L) 08/03/2015   ALBUMIN 2.6 (L) 08/03/2015   CALCIUM 9.2 08/03/2015   ANIONGAP 6 08/03/2015   Lab Results  Component Value Date   CHOL 156 08/01/2015   Lab Results  Component Value Date   HDL 23 (L) 08/01/2015   Lab Results  Component Value Date   LDLCALC 97 08/01/2015   Lab Results  Component Value Date   TRIG 180 (H) 08/01/2015   Lab Results  Component Value Date   CHOLHDL 6.8 08/01/2015   Lab Results  Component Value Date   HGBA1C 5.6 08/01/2015      Assessment & Plan:   Problem List Items Addressed This Visit      Cardiovascular and Mediastinum   Essential hypertension - Primary    An individual care plan was established and reinforced today.  The patient's status was assessed using clinical findings on exam and labs or diagnostic tests. The patient's success at meeting treatment goals on disease specific evidence-based guidelines and found to be poor controlled. SELF MANAGEMENT: The patient and I together assessed ways to personally work towards obtaining the recommended goals. RECOMMENDATIONS: avoid decongestants found in common cold remedies, decrease consumption of alcohol, perform routine monitoring of BP  with home BP cuff, exercise, reduction of dietary salt, take medicines as prescribed, try not to miss doses and quit smoking.  Regular exercise and maintaining a healthy weight is needed.  Stress reduction may help. A CLINICAL SUMMARY including written plan identify barriers to care unique to individual due to social or financial issues.  We attempt to mutually creat solutions for individual and family understanding.      Relevant Medications   amLODipine (NORVASC) 10 MG tablet   valsartan (DIOVAN) 320 MG tablet   Other Relevant Orders   CBC with Differential   Comprehensive metabolic panel   EKG 12-Lead (Completed)     Genitourinary   CKD (chronic kidney disease), stage III    AN INDIVIDUAL CARE PLAN was established and reinforced today.  The patient's status was assessed using clinical findings on exam, labs, and other diagnostic testing. Patient's success at meeting treatment goals based on disease specific evidence-bassed guidelines and found to be in good control. RECOMMENDATIONS include maintaining present medicines and treatment.        Other   Primary insomnia    AN INDIVIDUAL CARE PLAN was established and reinforced today.  The patient's status was assessed using clinical findings on exam, labs, and other diagnostic testing. Patient's success at meeting treatment goals based on disease specific evidence-bassed guidelines and found to be in good control. RECOMMENDATIONS include maintaining present medicines and treatment.      Relevant Medications   sertraline (ZOLOFT) 25 MG tablet    ````````````````````````````````````````````````` ````````````````````````````````````````````````````````````````````````````````````````````````````````````````````````````````````````````````````````````````````````````````````````````````````````````````````````````````````````````````````````````````````````````````````````````````````````````````````````````````````  Meds ordered this  encounter  Medications  . valsartan (DIOVAN) 320 MG tablet    Sig: Take 1 tablet (320 mg total) by mouth daily.    Dispense:  30 tablet    Refill:  2  . sertraline (  ZOLOFT) 25 MG tablet    Sig: Take 2 tablets (50 mg total) by mouth daily.    Dispense:  180 tablet    Refill:  2    Follow-up: Return in about 1 week (around 06/02/2019).    [name], MD

## 2019-05-26 NOTE — Assessment & Plan Note (Signed)
>>  ASSESSMENT AND PLAN FOR ESSENTIAL HYPERTENSION WRITTEN ON 05/26/2019  1:22 PM BY PERRY, Mickle Mallory, MD  An individual care plan was established and reinforced today.  The patient's status was assessed using clinical findings on exam and labs or diagnostic tests. The patient's success at meeting treatment goals on disease specific evidence-based guidelines and found to be poor controlled. SELF MANAGEMENT: The patient and I together assessed ways to personally work towards obtaining the recommended goals. RECOMMENDATIONS: avoid decongestants found in common cold remedies, decrease consumption of alcohol, perform routine monitoring of BP with home BP cuff, exercise, reduction of dietary salt, take medicines as prescribed, try not to miss doses and quit smoking.  Regular exercise and maintaining a healthy weight is needed.  Stress reduction may help. A CLINICAL SUMMARY including written plan identify barriers to care unique to individual due to social or financial issues.  We attempt to mutually creat solutions for individual and family understanding.

## 2019-05-26 NOTE — Assessment & Plan Note (Signed)
AN INDIVIDUAL CARE PLAN was established and reinforced today.  The patient's status was assessed using clinical findings on exam, labs, and other diagnostic testing. Patient's success at meeting treatment goals based on disease specific evidence-bassed guidelines and found to be in good control. RECOMMENDATIONS include maintaining present medicines and treatment. 

## 2019-05-26 NOTE — Patient Instructions (Addendum)

## 2019-05-27 NOTE — Progress Notes (Signed)
LVM to call us basck.

## 2019-05-27 NOTE — Progress Notes (Signed)
No anemia now, kidney tests stable, liver tests normal,  lp

## 2019-06-02 ENCOUNTER — Encounter: Payer: Self-pay | Admitting: Legal Medicine

## 2019-06-02 ENCOUNTER — Other Ambulatory Visit: Payer: Self-pay

## 2019-06-02 ENCOUNTER — Ambulatory Visit (INDEPENDENT_AMBULATORY_CARE_PROVIDER_SITE_OTHER): Payer: Medicare HMO | Admitting: Legal Medicine

## 2019-06-02 VITALS — BP 130/80 | HR 70 | Temp 98.6°F | Resp 17 | Ht 60.0 in | Wt 176.4 lb

## 2019-06-02 DIAGNOSIS — I350 Nonrheumatic aortic (valve) stenosis: Secondary | ICD-10-CM | POA: Diagnosis not present

## 2019-06-02 DIAGNOSIS — I10 Essential (primary) hypertension: Secondary | ICD-10-CM | POA: Diagnosis not present

## 2019-06-02 DIAGNOSIS — E782 Mixed hyperlipidemia: Secondary | ICD-10-CM | POA: Diagnosis not present

## 2019-06-02 DIAGNOSIS — I69393 Ataxia following cerebral infarction: Secondary | ICD-10-CM | POA: Diagnosis not present

## 2019-06-02 DIAGNOSIS — N1831 Chronic kidney disease, stage 3a: Secondary | ICD-10-CM | POA: Diagnosis not present

## 2019-06-02 MED ORDER — AMLODIPINE BESYLATE 10 MG PO TABS: 10.0000 mg | ORAL_TABLET | Freq: Every day | ORAL | 2 refills | Status: DC

## 2019-06-02 NOTE — Assessment & Plan Note (Signed)
>>  ASSESSMENT AND PLAN FOR ESSENTIAL HYPERTENSION WRITTEN ON 06/02/2019  8:04 PM BY PERRY, Mickle Mallory, MD  An individual care plan was established and reinforced today.  The patient's status was assessed using clinical findings on exam and labs or diagnostic tests. The patient's success at meeting treatment goals on disease specific evidence-based guidelines and found to be well controlled. SELF MANAGEMENT: The patient and I together assessed ways to personally work towards obtaining the recommended goals. RECOMMENDATIONS: avoid decongestants found in common cold remedies, decrease consumption of alcohol, perform routine monitoring of BP with home BP cuff, exercise, reduction of dietary salt, take medicines as prescribed, try not to miss doses and quit smoking.  Regular exercise and maintaining a healthy weight is needed.  Stress reduction may help. A CLINICAL SUMMARY including written plan identify barriers to care unique to individual due to social or financial issues.  We attempt to mutually creat solutions for individual and family understanding.

## 2019-06-02 NOTE — Assessment & Plan Note (Signed)
Patient was evaluated using information from exam, tests and other diagnostic studies to perform evidence-based treatment for this disorder.  Opimizing treatment and improvement of neurologic deficits.  Patient is using cane to maintain as much independence as possible. Patient using cane.  Patient has limited ability to perform ADLs. 

## 2019-06-02 NOTE — Assessment & Plan Note (Signed)
Patient was evaluated for stage 3b.  It is important to maintain good Blood Pressure control is important.[name] Keep on low protein diet and remain with adequate hydration to maintain function.

## 2019-06-02 NOTE — Assessment & Plan Note (Signed)
AN INDIVIDUAL CARE PLAN was established and reinforced today.  The patient's status was assessed using clinical findings on exam, labs, and other diagnostic testing. Patient's success at meeting treatment goals based on disease specific evidence-bassed guidelines and found to be in good control. RECOMMENDATIONS include maintaining present medicines and treatment. 

## 2019-06-02 NOTE — Progress Notes (Signed)
Established Patient Office Visit  Subjective:  Patient ID: [name], female    DOB: 11/17/1940  Age: 79 y.o. MRN: [id number]  CC:  Chief Complaint  Patient presents with  . Hypertension    Patient started Valsartan, however dyastolic still high  . Depression    She is taking zoloft    HPI [name] presents for chronic visit.  Patient presents for follow up of hypertension.  Patient tolerating valsartan well with side effects.  Patient was diagnosed with hypertension 2010 so has been treated for hypertension for 10 years.Patient is working on maintaining diet and exercise regimen and follows up as directed. Complication include none.  This patient has major depression for 6 month.  PHQ9 =25.  Patient is having less anhedonia anhedonia.  The patient has improved future plans and prospects.  The depression is worse with stress.  The patient is not exercising and working on behavior to improve mental health.  Patient is not seeing a therapist or psychiatrist.  na  Patient is on sertraline..  Past Medical History:  Diagnosis Date  . Anemia   . CKD (chronic kidney disease), stage III   . Depression   . GERD (gastroesophageal reflux disease)   . Glaucoma   . HTN (hypertension) 07/12/2011  . Hyperlipidemia   . Stroke [facility])     Past Surgical History:  Procedure Laterality Date  . ABDOMINAL HYSTERECTOMY    . BACK SURGERY    . cataract surgery    . COLONOSCOPY N/A 08/03/2015   Procedure: COLONOSCOPY;  Surgeon: Laurence Spates, MD;  Location: [facility] ENDOSCOPY;  Service: Endoscopy;  Laterality: N/A;  . ESOPHAGOGASTRODUODENOSCOPY N/A 07/31/2015   Procedure: ESOPHAGOGASTRODUODENOSCOPY (EGD);  Surgeon: Laurence Spates, MD;  Location: [facility] ENDOSCOPY;  Service: Endoscopy;  Laterality: N/A;  . OOPHORECTOMY    . TOTAL HIP ARTHROPLASTY    . URETER SURGERY      Family History  Problem Relation Age of Onset  . Hypertension Mother   . Hypertension Father   . Heart attack Father   .  Hypertension Brother   . Diabetes Brother     Social History   Socioeconomic History  . Marital status: Married    Spouse name: Not on file  . Number of children: Not on file  . Years of education: Not on file  . Highest education level: Not on file  Occupational History  . Not on file  Tobacco Use  . Smoking status: Never Smoker  . Smokeless tobacco: Never Used  Substance and Sexual Activity  . Alcohol use: No  . Drug use: No  . Sexual activity: Not Currently  Other Topics Concern  . Not on file  Social History Narrative  . Not on file   Social Determinants of Health   Financial Resource Strain:   . Difficulty of Paying Living Expenses: Not on file  Food Insecurity:   . Worried About Charity fundraiser in the Last Year: Not on file  . Ran Out of Food in the Last Year: Not on file  Transportation Needs:   . Lack of Transportation (Medical): Not on file  . Lack of Transportation (Non-Medical): Not on file  Physical Activity:   . Days of Exercise per Week: Not on file  . Minutes of Exercise per Session: Not on file  Stress:   . Feeling of Stress : Not on file  Social Connections:   . Frequency of Communication with Friends and Family: Not on file  .  Frequency of Social Gatherings with Friends and Family: Not on file  . Attends Religious Services: Not on file  . Active Member of Clubs or Organizations: Not on file  . Attends Banker Meetings: Not on file  . Marital Status: Not on file  Intimate Partner Violence:   . Fear of Current or Ex-Partner: Not on file  . Emotionally Abused: Not on file  . Physically Abused: Not on file  . Sexually Abused: Not on file    Outpatient Medications Prior to Visit  Medication Sig Dispense Refill  . aspirin 325 MG tablet Take 325 mg by mouth daily.    . clopidogrel (PLAVIX) 75 MG tablet Take 1 tablet (75 mg total) by mouth daily. 30 tablet 0  . furosemide (LASIX) 20 MG tablet Take 20 mg by mouth daily as needed for  fluid.    [name] latanoprost (XALATAN) 0.005 % ophthalmic solution 1 drop at bedtime.    . Loratadine 10 MG CAPS Take 10 mg by mouth daily.    . pantoprazole (PROTONIX) 40 MG tablet Take 1 tablet (40 mg total) by mouth 2 (two) times daily.    . sertraline (ZOLOFT) 25 MG tablet Take 2 tablets (50 mg total) by mouth daily. 180 tablet 2  . valsartan (DIOVAN) 320 MG tablet Take 320 mg by mouth daily.    [name] amLODipine (NORVASC) 10 MG tablet Take 10 mg by mouth daily.    [name] amLODipine (NORVASC) 10 MG tablet Take 10 mg by mouth daily.    [name] escitalopram (LEXAPRO) 10 MG tablet Take 10 mg by mouth daily.    [name] losartan (COZAAR) 100 MG tablet Take 100 mg by mouth daily.    . valsartan (DIOVAN) 320 MG tablet Take 1 tablet (320 mg total) by mouth daily. 30 tablet 2   No facility-administered medications prior to visit.    Allergies  Allergen Reactions  . Codeine Nausea And Vomiting    Nausea   . Meclizine Other (See Comments)    Lethargy, extreme sleepiness  . Requip [Ropinirole Hcl] Nausea And Vomiting  . Statins     Muscle weakness    ROS Review of Systems  Constitutional: Negative.   HENT: Negative.   Eyes: Positive for visual disturbance.  Respiratory: Negative.   Cardiovascular: Negative.   Gastrointestinal: Negative.   Endocrine: Negative.   Genitourinary: Negative.   Musculoskeletal: Negative.   Skin: Negative.   Allergic/Immunologic: Negative.   Neurological: Negative.   Hematological: Negative.   Psychiatric/Behavioral: Positive for dysphoric mood.      Objective:    Physical Exam  Constitutional: She is oriented to person, place, and time. She appears well-developed and well-nourished.  HENT:  Head: Normocephalic and atraumatic.  Eyes: Pupils are equal, round, and reactive to light. Conjunctivae and EOM are normal.  Cardiovascular: Normal rate, regular rhythm and normal heart sounds.  Pulmonary/Chest: Effort normal and breath sounds normal.  Abdominal: Soft. Bowel sounds  are normal.  Musculoskeletal:        General: Normal range of motion.     Cervical back: Normal range of motion and neck supple.  Neurological: She is alert and oriented to person, place, and time. She has normal reflexes.  Skin: Skin is warm and dry.  Psychiatric: She has a normal mood and affect.  Vitals reviewed.   BP 130/80 (BP Location: Right Arm, Patient Position: Sitting)   Pulse 70   Temp 98.6 F (37 C) (Temporal)   Resp 17   Ht 5' (  1.524 m)   Wt 176 lb 6.4 oz (80 kg)   BMI 34.45 kg/m  Wt Readings from Last 3 Encounters:  06/02/19 176 lb 6.4 oz (80 kg)  05/26/19 173 lb 6.4 oz (78.7 kg)  05/25/18 173 lb (78.5 kg)     Health Maintenance Due  Topic Date Due  . TETANUS/TDAP  08/09/1959  . DEXA SCAN  08/08/2005  . PNA vac Low Risk Adult (1 of 2 - PCV13) 08/08/2005  . INFLUENZA VACCINE  10/24/2018    There are no preventive care reminders to display for this patient.  No results found for: TSH Lab Results  Component Value Date   WBC 5.8 05/26/2019   HGB 13.4 05/26/2019   HCT 40.8 05/26/2019   MCV 93 05/26/2019   PLT 274 05/26/2019   Lab Results  Component Value Date   NA 141 05/26/2019   K 4.2 05/26/2019   CO2 20 05/26/2019   GLUCOSE 99 05/26/2019   BUN 24 05/26/2019   CREATININE 1.23 (H) 05/26/2019   BILITOT 0.3 05/26/2019   ALKPHOS 131 (H) 05/26/2019   AST 12 05/26/2019   ALT 10 05/26/2019   PROT 6.9 05/26/2019   ALBUMIN 4.2 05/26/2019   CALCIUM 10.1 05/26/2019   ANIONGAP 6 08/03/2015   Lab Results  Component Value Date   CHOL 156 08/01/2015   Lab Results  Component Value Date   HDL 23 (L) 08/01/2015   Lab Results  Component Value Date   LDLCALC 97 08/01/2015   Lab Results  Component Value Date   TRIG 180 (H) 08/01/2015   Lab Results  Component Value Date   CHOLHDL 6.8 08/01/2015   Lab Results  Component Value Date   HGBA1C 5.6 08/01/2015      Assessment & Plan:   Problem List Items Addressed This Visit       Cardiovascular and Mediastinum   Mild aortic stenosis    AN INDIVIDUAL CARE PLAN was established and reinforced today.  The patient's status was assessed using clinical findings on exam, labs, and other diagnostic testing. Patient's success at meeting treatment goals based on disease specific evidence-bassed guidelines and found to be in good control. RECOMMENDATIONS include maintaining present medicines and treatment.      Relevant Medications   valsartan (DIOVAN) 320 MG tablet   amLODipine (NORVASC) 10 MG tablet   Essential hypertension - Primary    An individual care plan was established and reinforced today.  The patient's status was assessed using clinical findings on exam and labs or diagnostic tests. The patient's success at meeting treatment goals on disease specific evidence-based guidelines and found to be well controlled. SELF MANAGEMENT: The patient and I together assessed ways to personally work towards obtaining the recommended goals. RECOMMENDATIONS: avoid decongestants found in common cold remedies, decrease consumption of alcohol, perform routine monitoring of BP with home BP cuff, exercise, reduction of dietary salt, take medicines as prescribed, try not to miss doses and quit smoking.  Regular exercise and maintaining a healthy weight is needed.  Stress reduction may help. A CLINICAL SUMMARY including written plan identify barriers to care unique to individual due to social or financial issues.  We attempt to mutually creat solutions for individual and family understanding.      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  Opimizing  treatment and improvement of neurologic deficits.  Patient is using cane to maintain as much independence as possible. Patient using cane.  Patient has limited ability to perform ADLs.        Genitourinary   CKD (chronic kidney disease), stage III    Patient was evaluated for stage 3b.  It is important to maintain good Blood Pressure control is important.[name] Keep on low protein diet and remain with adequate hydration to maintain function.        Other   Mixed hyperlipidemia    AN INDIVIDUAL CARE PLAN was established and reinforced today.  The patient's status was assessed using clinical findings on exam, lab and other diagnostic tests. The patient's disease status was assessed based on evidence-based guidelines and found to be well controlled. MEDICATIONS were reviewed. SELF MANAGEMENT GOALS have been discussed and patient's success at attaining the goal of low cholesterol was assessed. RECOMMENDATION given include regular exercise 3 days a week and low cholesterol/low fat diet. CLINICAL SUMMARY including written plan to identify barriers unique to the patient due to social or economic  reasons was discussed.      Relevant Medications   valsartan (DIOVAN) 320 MG tablet   amLODipine (NORVASC) 10 MG tablet   Other Relevant Orders   Lipid Panel      Meds ordered this encounter  Medications  . amLODipine (NORVASC) 10 MG tablet    Sig: Take 1 tablet (10 mg total) by mouth daily.    Dispense:  90 tablet    Refill:  2    Follow-up: Return in about 4 months (around 10/02/2019) for fasting.    [name], MD

## 2019-06-02 NOTE — Assessment & Plan Note (Signed)

## 2019-06-02 NOTE — Patient Instructions (Signed)
How to Use a Cane  Canes are used to help with walking. Using a cane makes you more stable, reduces pain, and eases strain on certain muscle groups. There are various kinds of canes. Most have either a single point, four points (quad cane), or three points at the bottom. The best kind of cane for you depends on what you need it for. People with arthritis generally do well with a single-point cane. People who have certain neurological conditions, such as people who have had a stroke, may do better with a quad cane because it allows them to put more weight on it (support more of their body weight). How to choose a cane that fits It is important to use a cane that fits properly. A cane fits properly if the top of the cane comes to your wrist joint when you are standing upright with your arm relaxed at your side. How to use your cane Hold your cane in the hand opposite the injured or weaker side. Always move the cane and the foot of the weaker side with each other (in unison). Walking  Put as much weight on the cane as necessary to make walking comfortable, stable, and smooth.  Stand tall with good posture and look ahead, not down at your feet.  Hold the cane about 2 inches (5 cm) in front or to the side of you.  Each time you take a step with your injured leg, move the cane at the same time to help balance you. Going up steps  Step first with your stronger foot.  Move the cane and the weaker foot up the step at the same time.  Always use the railing with your free hand. Going down steps  Step down first with the cane and your weaker foot.  Then follow with your stronger foot.  Always use the railing with your free hand. Safety tips for home Take these steps to make your home safer when you are walking with a cane:  Be familiar with your home environment.  Have sturdy handrails in your bathrooms and hallways.  Wear nonslip, comfortable, well-fitting footwear.  Use night-lights in  the dark.  Keep floor surfaces clean and dry.  Keep high-traffic areas uncluttered.  Remove any rugs, cords, or loose objects from the floor. Contact a health care provider if:  You still feel unsteady on your feet while using the cane.  You develop new pain, such as pain in your back, shoulder, wrist, or hip.  You develop any numbness or tingling. Get help right away if:  You fall. Summary  Using a cane makes you more stable, reduces pain, and eases strain on certain muscle groups.  A cane fits properly if the top of the cane comes to your wrist joint when you are standing upright with your arm relaxed at your side.  The best kind of cane for you depends on what you need it for.  Hold your cane in the hand opposite the injured or weaker side.  Always move the cane and the foot of the weaker side with each other (in unison). This information is not intended to replace advice given to you by your health care provider. Make sure you discuss any questions you have with your health care provider. Document Revised: 04/13/2016 Document Reviewed: 04/13/2016 Elsevier Patient Education  2020 [employer]  

## 2019-06-02 NOTE — Assessment & Plan Note (Signed)

## 2019-06-03 LAB — CBC WITH DIFFERENTIAL/PLATELET
Basophils Absolute: 0 10*3/uL (ref 0.0–0.2)
Basos: 1 %
EOS (ABSOLUTE): 0.1 10*3/uL (ref 0.0–0.4)
Eos: 2 %
Hematocrit: 39.4 % (ref 34.0–46.6)
Hemoglobin: 12.9 g/dL (ref 11.1–15.9)
Immature Grans (Abs): 0 10*3/uL (ref 0.0–0.1)
Immature Granulocytes: 0 %
Lymphocytes Absolute: 1.1 10*3/uL (ref 0.7–3.1)
Lymphs: 21 %
MCH: 30.1 pg (ref 26.6–33.0)
MCHC: 32.7 g/dL (ref 31.5–35.7)
MCV: 92 fL (ref 79–97)
Monocytes Absolute: 0.6 10*3/uL (ref 0.1–0.9)
Monocytes: 10 %
Neutrophils Absolute: 3.6 10*3/uL (ref 1.4–7.0)
Neutrophils: 66 %
Platelets: 291 10*3/uL (ref 150–450)
RBC: 4.29 x10E6/uL (ref 3.77–5.28)
RDW: 12.7 % (ref 11.7–15.4)
WBC: 5.5 10*3/uL (ref 3.4–10.8)

## 2019-06-03 LAB — COMPREHENSIVE METABOLIC PANEL
ALT: 11 IU/L (ref 0–32)
AST: 14 IU/L (ref 0–40)
Albumin/Globulin Ratio: 1.7 (ref 1.2–2.2)
Albumin: 4.3 g/dL (ref 3.7–4.7)
Alkaline Phosphatase: 127 IU/L — ABNORMAL HIGH (ref 39–117)
BUN/Creatinine Ratio: 22 (ref 12–28)
BUN: 25 mg/dL (ref 8–27)
Bilirubin Total: 0.4 mg/dL (ref 0.0–1.2)
CO2: 20 mmol/L (ref 20–29)
Calcium: 9.8 mg/dL (ref 8.7–10.3)
Chloride: 107 mmol/L — ABNORMAL HIGH (ref 96–106)
Creatinine, Ser: 1.16 mg/dL — ABNORMAL HIGH (ref 0.57–1.00)
GFR calc Af Amer: 52 mL/min/{1.73_m2} — ABNORMAL LOW (ref >59)
GFR calc non Af Amer: 45 mL/min/{1.73_m2} — ABNORMAL LOW (ref >59)
Globulin, Total: 2.6 g/dL (ref 1.5–4.5)
Glucose: 96 mg/dL (ref 65–99)
Potassium: 4.6 mmol/L (ref 3.5–5.2)
Sodium: 141 mmol/L (ref 134–144)
Total Protein: 6.9 g/dL (ref 6.0–8.5)

## 2019-06-03 LAB — LIPID PANEL
Chol/HDL Ratio: 8.2 ratio — ABNORMAL HIGH (ref 0.0–4.4)
Cholesterol, Total: 302 mg/dL — ABNORMAL HIGH (ref 100–199)
HDL: 37 mg/dL — ABNORMAL LOW (ref >39)
LDL Chol Calc (NIH): 204 mg/dL — ABNORMAL HIGH (ref 0–99)
Triglycerides: 300 mg/dL — ABNORMAL HIGH (ref 0–149)
VLDL Cholesterol Cal: 61 mg/dL — ABNORMAL HIGH (ref 5–40)

## 2019-06-03 LAB — CARDIOVASCULAR RISK ASSESSMENT

## 2019-06-04 NOTE — Progress Notes (Signed)
No anemia, kidney tests stable, liver tests OK, cholesterol is bad- LDL 204, very high- consider nexletol ( not a statin) or injectable. lp

## 2019-06-07 DIAGNOSIS — H8110 Benign paroxysmal vertigo, unspecified ear: Secondary | ICD-10-CM | POA: Insufficient documentation

## 2019-06-07 HISTORY — DX: Benign paroxysmal vertigo, unspecified ear: H81.10

## 2019-06-08 ENCOUNTER — Ambulatory Visit (INDEPENDENT_AMBULATORY_CARE_PROVIDER_SITE_OTHER): Payer: Medicare HMO | Admitting: Legal Medicine

## 2019-06-08 ENCOUNTER — Other Ambulatory Visit: Payer: Self-pay | Admitting: Legal Medicine

## 2019-06-08 ENCOUNTER — Other Ambulatory Visit: Payer: Self-pay

## 2019-06-08 ENCOUNTER — Encounter: Payer: Self-pay | Admitting: Legal Medicine

## 2019-06-08 VITALS — BP 138/90 | HR 75 | Temp 97.7°F | Resp 16 | Ht <= 58 in | Wt 178.0 lb

## 2019-06-08 DIAGNOSIS — E782 Mixed hyperlipidemia: Secondary | ICD-10-CM

## 2019-06-08 DIAGNOSIS — Z0000 Encounter for general adult medical examination without abnormal findings: Secondary | ICD-10-CM

## 2019-06-08 MED ORDER — NEXLETOL 180 MG PO TABS: 1.0000 | ORAL_TABLET | Freq: Once | ORAL | 3 refills | Status: AC

## 2019-06-08 NOTE — Patient Instructions (Signed)
Advance Directive  Advance directives are legal documents that let you make choices ahead of time about your health care and medical treatment in case you become unable to communicate for yourself. Advance directives are a way for you to make known your wishes to family, friends, and health care providers. This can let others know about your end-of-life care if you become unable to communicate. Discussing and writing advance directives should happen over time rather than all at once. Advance directives can be changed depending on your situation and what you want, even after you have signed the advance directives. There are different types of advance directives, such as:  Medical power of attorney.  Living will.  Do not resuscitate (DNR) or do not attempt resuscitation (DNAR) order. Health care proxy and medical power of attorney A health care proxy is also called a health care agent. This is a person who is appointed to make medical decisions for you in cases where you are unable to make the decisions yourself. Generally, people choose someone they know well and trust to represent their preferences. Make sure to ask this person for an agreement to act as your proxy. A proxy may have to exercise judgment in the event of a medical decision for which your wishes are not known. A medical power of attorney is a legal document that names your health care proxy. Depending on the laws in your state, after the document is written, it may also need to be:  Signed.  Notarized.  Dated.  Copied.  Witnessed.  Incorporated into your medical record. You may also want to appoint someone to manage your money in a situation in which you are unable to do so. This is called a durable power of attorney for finances. It is a separate legal document from the durable power of attorney for health care. You may choose the same person or someone different from your health care proxy to act as your agent in money  matters. If you do not appoint a proxy, or if there is a concern that the proxy is not acting in your best interests, a court may appoint a guardian to act on your behalf. Living will A living will is a set of instructions that state your wishes about medical care when you cannot express them yourself. Health care providers should keep a copy of your living will in your medical record. You may want to give a copy to family members or friends. To alert caregivers in case of an emergency, you can place a card in your wallet to let them know that you have a living will and where they can find it. A living will is used if you become:  Terminally ill.  Disabled.  Unable to communicate or make decisions. Items to consider in your living will include:  To use or not to use life-support equipment, such as dialysis machines and breathing machines (ventilators).  A DNR or DNAR order. This tells health care providers not to use cardiopulmonary resuscitation (CPR) if breathing or heartbeat stops.  To use or not to use tube feeding.  To be given or not to be given food and fluids.  Comfort (palliative) care when the goal becomes comfort rather than a cure.  Donation of organs and tissues. A living will does not give instructions for distributing your money and property if you should pass away. DNR or DNAR A DNR or DNAR order is a request not to have CPR in the event that   your heart stops beating or you stop breathing. If a DNR or DNAR order has not been made and shared, a health care provider will try to help any patient whose heart has stopped or who has stopped breathing. If you plan to have surgery, talk with your health care provider about how your DNR or DNAR order will be followed if problems occur. What if I do not have an advance directive? If you do not have an advance directive, some states assign family decision makers to act on your behalf based on how closely you are related to them. Each  state has its own laws about advance directives. You may want to check with your health care provider, attorney, or state representative about the laws in your state. Summary  Advance directives are the legal documents that allow you to make choices ahead of time about your health care and medical treatment in case you become unable to tell others about your care.  The process of discussing and writing advance directives should happen over time. You can change the advance directives, even after you have signed them.  Advance directives include DNR or DNAR orders, living wills, and designating an agent as your medical power of attorney. This information is not intended to replace advice given to you by your health care provider. Make sure you discuss any questions you have with your health care provider. Document Revised: 10/08/2018 Document Reviewed: 10/08/2018 Elsevier Patient Education  2020 [employer]  

## 2019-06-08 NOTE — Progress Notes (Signed)
Subjective:   [name] is a 79 y.o. female who presents for Medicare Annual (Subsequent) preventive examination. No active problems.  Review of Systems:  Review of Systems  Constitutional: Negative.   HENT: Negative.   Eyes: Negative.   Respiratory: Negative.   Cardiovascular: Negative.   Gastrointestinal: Negative.   Genitourinary: Negative.   Musculoskeletal: Negative.   Skin: Negative.   Neurological: Negative.   Endo/Heme/Allergies: Negative.   Psychiatric/Behavioral: Negative.          Objective:     Vitals: BP 138/90   Pulse 75   Temp 97.7 F (36.5 C)   Resp 16   Ht 4' 9.87" (1.47 m)   Wt 178 lb (80.7 kg)   SpO2 97%   BMI 37.36 kg/m   Body mass index is 37.36 kg/m.  Advanced Directives 06/08/2019 07/31/2015 06/27/2015 06/13/2015  Does Patient Have a Medical Advance Directive? No No Yes No  Would patient like information on creating a medical advance directive? - No - patient declined information Yes - Educational materials given No - patient declined information    Tobacco Social History   Tobacco Use  Smoking Status Never Smoker  Smokeless Tobacco Never Used     Counseling given: Not Answered   Clinical Intake:  Pre-visit preparation completed: Yes  Pain : No/denies pain     BMI - recorded: 37 Nutritional Status: BMI > 30  Obese Nutritional Risks: None Diabetes: No  How often do you need to have someone help you when you read instructions, pamphlets, or other written materials from your doctor or pharmacy?: 1 - Never  Interpreter Needed?: No     Past Medical History:  Diagnosis Date  . Anemia   . Benign paroxysmal vertigo, unspecified ear 06/07/2019  . CKD (chronic kidney disease), stage III   . Depression   . GERD (gastroesophageal reflux disease)   . Glaucoma   . HTN (hypertension) 07/12/2011  . Hyperlipidemia   . Stroke [facility])    Past Surgical History:  Procedure Laterality Date  . ABDOMINAL HYSTERECTOMY    . BACK SURGERY     . cataract surgery    . COLONOSCOPY N/A 08/03/2015   Procedure: COLONOSCOPY;  Surgeon: Carman Ching, MD;  Location: [facility] ENDOSCOPY;  Service: Endoscopy;  Laterality: N/A;  . ESOPHAGOGASTRODUODENOSCOPY N/A 07/31/2015   Procedure: ESOPHAGOGASTRODUODENOSCOPY (EGD);  Surgeon: Carman Ching, MD;  Location: [facility] ENDOSCOPY;  Service: Endoscopy;  Laterality: N/A;  . OOPHORECTOMY    . TOTAL HIP ARTHROPLASTY    . URETER SURGERY     Family History  Problem Relation Age of Onset  . Hypertension Mother   . Hypertension Father   . Heart attack Father   . Hypertension Brother   . Diabetes Brother    Social History   Socioeconomic History  . Marital status: Married    Spouse name: Not on file  . Number of children: Not on file  . Years of education: Not on file  . Highest education level: Not on file  Occupational History  . Not on file  Tobacco Use  . Smoking status: Never Smoker  . Smokeless tobacco: Never Used  Substance and Sexual Activity  . Alcohol use: No  . Drug use: No  . Sexual activity: Not Currently  Other Topics Concern  . Not on file  Social History Narrative  . Not on file   Social Determinants of Health   Financial Resource Strain:   . Difficulty of Paying Living Expenses:  Food Insecurity:   . Worried About Charity fundraiser in the Last Year:   . Arboriculturist in the Last Year:   Transportation Needs:   . Film/video editor (Medical):   [name] Lack of Transportation (Non-Medical):   Physical Activity:   . Days of Exercise per Week:   . Minutes of Exercise per Session:   Stress:   . Feeling of Stress :   Social Connections:   . Frequency of Communication with Friends and Family:   . Frequency of Social Gatherings with Friends and Family:   . Attends Religious Services:   . Active Member of Clubs or Organizations:   . Attends Archivist Meetings:   [name] Marital Status:     Outpatient Encounter Medications as of 06/08/2019  Medication Sig  .  Acetaminophen (TYLENOL) 325 MG CAPS Take 650 mg by mouth every 6 (six) hours.  [name] amLODipine (NORVASC) 10 MG tablet Take 1 tablet (10 mg total) by mouth daily.  [name] aspirin 325 MG tablet Take 325 mg by mouth daily.  . clopidogrel (PLAVIX) 75 MG tablet Take 1 tablet (75 mg total) by mouth daily.  . furosemide (LASIX) 20 MG tablet Take 20 mg by mouth daily as needed for fluid.  [name] latanoprost (XALATAN) 0.005 % ophthalmic solution 1 drop at bedtime.  . Loratadine 10 MG CAPS Take 10 mg by mouth daily.  . pantoprazole (PROTONIX) 40 MG tablet Take 1 tablet (40 mg total) by mouth 2 (two) times daily.  . sertraline (ZOLOFT) 25 MG tablet Take 2 tablets (50 mg total) by mouth daily.  . valsartan (DIOVAN) 320 MG tablet Take 320 mg by mouth daily.   No facility-administered encounter medications on file as of 06/08/2019.    Activities of Daily Living In your present state of health, do you have any difficulty performing the following activities: 06/08/2019  Hearing? N  Vision? N  Difficulty concentrating or making decisions? N  Walking or climbing stairs? Y  Dressing or bathing? N  Doing errands, shopping? N  Preparing Food and eating ? N  Using the Toilet? N  In the past six months, have you accidently leaked urine? N  Do you have problems with loss of bowel control? N  Managing your Medications? N  Managing your Finances? N  Housekeeping or managing your Housekeeping? N  Some recent data might be hidden    Patient Care Team: Lillard Anes, MD as PCP - General (Family Medicine)    Assessment:   This is a routine wellness examination for [place].  Exercise Activities and Dietary recommendations Current Exercise Habits: The patient does not participate in regular exercise at present, Exercise limited by: orthopedic condition(s)  Goals    . DIET - REDUCE PORTION SIZE       Fall Risk Fall Risk  06/08/2019 06/02/2019 06/02/2019 10/03/2015 08/24/2015  Falls in the past year? 1 - 1 Yes Yes   Number falls in past yr: 1 - 1 2 or more 2 or more  Injury with Fall? 1 - 1 Yes Yes  Risk Factor Category  - - - High Fall Risk High Fall Risk  Risk for fall due to : History of fall(s) History of fall(s) History of fall(s) History of fall(s) History of fall(s)  Risk for fall due to: Comment - - - - -  Follow up Falls evaluation completed;Education provided;Falls prevention discussed Falls evaluation completed - Falls prevention discussed Falls prevention discussed   Is the  patient's home free of loose throw rugs in walkways, pet beds, electrical cords, etc?   no      Grab bars in the bathroom? yes      Handrails on the stairs?  yes      Adequate lighting?   yes  Timed Get Up and Go performed: 10 seconds  Depression Screen PHQ 2/9 Scores 06/02/2019 05/28/2019 08/07/2015 06/27/2015  PHQ - 2 Score 3 2 0 0  PHQ- 9 Score 18 17 - -     Cognitive Function MMSE - Mini Mental State Exam 06/08/2019  Orientation to time 5  Orientation to Place 5  Registration 3  Attention/ Calculation 4  Recall 2  Language- name 2 objects 2  Language- repeat 1  Language- follow 3 step command 3  Language- read & follow direction 1  Write a sentence 1  Copy design 0  Total score 27         There is no immunization history on file for this patient.  Qualifies for Shingles Vaccine? Patient refuses  Screening Tests Health Maintenance  Topic Date Due  . TETANUS/TDAP  Never done  . DEXA SCAN  Never done  . PNA vac Low Risk Adult (1 of 2 - PCV13) Never done  . INFLUENZA VACCINE  Never done  patient refuses all immunizations.  Cancer Screenings: Lung: Low Dose CT Chest recommended if Age 86-80 years, 30 pack-year currently smoking OR have quit w/in 15years. Patient does not qualify. Breast:  Up to date on Mammogram? Yes   Up to date of Bone Density/Dexa? Yes Colorectal: neg  Additional Screenings: negative in past Hepatitis C Screening:      Plan:    I have personally reviewed and noted the  following in the patient's chart:   . Medical and social history . Use of alcohol, tobacco or illicit drugs  . Current medications and supplements . Functional ability and status . Nutritional status . Physical activity . Advanced directives . List of other physicians . Hospitalizations, surgeries, and ER visits in previous 12 months . Vitals . Screenings to include cognitive, depression, and falls . Referrals and appointments  In addition, I have reviewed and discussed with patient certain preventive protocols, quality metrics, and best practice recommendations. A written personalized care plan for preventive services as well as general preventive health recommendations were provided to patient.     [name], MD  06/08/2019

## 2019-06-10 ENCOUNTER — Ambulatory Visit: Payer: Medicare HMO | Attending: Internal Medicine

## 2019-06-10 DIAGNOSIS — Z23 Encounter for immunization: Secondary | ICD-10-CM

## 2019-06-10 NOTE — Progress Notes (Signed)
   Covid-19 Vaccination Clinic  Name:  [name]    MRN: [id number] DOB: 13-Jul-1940  06/10/2019  [name] was observed post Covid-19 immunization for 30 minutes based on pre-vaccination screening without incident. She was provided with Vaccine Information Sheet and instruction to access the V-Safe system.   [name] was instructed to call 911 with any severe reactions post vaccine: [name] Difficulty breathing  . Swelling of face and throat  . A fast heartbeat  . A bad rash all over body  . Dizziness and weakness   Immunizations Administered    Name Date Dose VIS Date Route   Pfizer COVID-19 Vaccine 06/10/2019  9:11 AM 0.3 mL 03/05/2019 Intramuscular   Manufacturer: [employer], [employer]   Lot: [id number]   NDC: [address]-7

## 2019-06-14 ENCOUNTER — Ambulatory Visit: Payer: Medicare HMO

## 2019-06-15 DIAGNOSIS — H401131 Primary open-angle glaucoma, bilateral, mild stage: Secondary | ICD-10-CM | POA: Diagnosis not present

## 2019-06-16 DIAGNOSIS — H401131 Primary open-angle glaucoma, bilateral, mild stage: Secondary | ICD-10-CM | POA: Diagnosis not present

## 2019-06-18 ENCOUNTER — Telehealth: Payer: Self-pay | Admitting: Legal Medicine

## 2019-06-18 NOTE — Progress Notes (Signed)
  Chronic Care Management   Outreach Note  06/18/2019 Name: [name] MRN: [id number] DOB: April 27, 1940  Referred by: [name], MD Reason for referral : No chief complaint on file.   An unsuccessful telephone outreach was attempted today. The patient was referred to the pharmacist for assistance with care management and care coordination.   Follow Up Plan:   Lynnae January Upstream Scheduler

## 2019-06-28 ENCOUNTER — Ambulatory Visit: Payer: Medicare HMO

## 2019-06-29 ENCOUNTER — Telehealth: Payer: Self-pay | Admitting: Legal Medicine

## 2019-06-29 NOTE — Progress Notes (Signed)
  Chronic Care Management   Outreach Note  06/29/2019 Name: [name] MRN: [id number] DOB: 26-Apr-1940  Referred by: [name], MD Reason for referral : No chief complaint on file.   An unsuccessful telephone outreach was attempted today. The patient was referred to the pharmacist for assistance with care management and care coordination.   Follow Up Plan:   Lynnae January Upstream Scheduler

## 2019-07-07 ENCOUNTER — Ambulatory Visit: Payer: Medicare HMO | Attending: Internal Medicine

## 2019-07-07 DIAGNOSIS — Z23 Encounter for immunization: Secondary | ICD-10-CM

## 2019-07-07 NOTE — Progress Notes (Signed)
   Covid-19 Vaccination Clinic  Name:  SHARRON PETRUSKA    MRN: [id number] DOB: 15-Jan-1941  07/07/2019  Ms. Sorey was observed post Covid-19 immunization for 15 minutes without incident. She was provided with Vaccine Information Sheet and instruction to access the V-Safe system.   Ms. Simoni was instructed to call 911 with any severe reactions post vaccine: [name] Difficulty breathing  . Swelling of face and throat  . A fast heartbeat  . A bad rash all over body  . Dizziness and weakness   Immunizations Administered    Name Date Dose VIS Date Route   Pfizer COVID-19 Vaccine 07/07/2019  9:20 AM 0.3 mL 03/05/2019 Intramuscular   Manufacturer: [employer], [employer]   Lot: [id number]   NDC: [address]-8

## 2019-07-08 ENCOUNTER — Telehealth: Payer: Self-pay | Admitting: Legal Medicine

## 2019-07-08 NOTE — Progress Notes (Signed)
  Chronic Care Management   Outreach Note  07/08/2019 Name: KIMBALL MANSKE MRN: [id number] DOB: Jun 28, 1940  Referred by: [name], MD Reason for referral : No chief complaint on file.   An unsuccessful telephone outreach was attempted today. The patient was referred to the pharmacist for assistance with care management and care coordination.   Follow Up Plan:   Lynnae January Upstream Scheduler

## 2019-07-20 ENCOUNTER — Telehealth: Payer: Self-pay | Admitting: Legal Medicine

## 2019-07-20 NOTE — Progress Notes (Signed)
  Chronic Care Management   Outreach Note  07/20/2019 Name: [name] MRN: [id number] DOB: [birth year]  Referred by: [name], MD Reason for referral : No chief complaint on file.   An unsuccessful telephone outreach was attempted today. The patient was referred to the pharmacist for assistance with care management and care coordination.  This note is not being shared with the patient for the following reason: To respect privacy (The patient or proxy has requested that the information not be shared). Follow Up Plan:   Lynnae January Upstream Scheduler

## 2019-07-23 ENCOUNTER — Other Ambulatory Visit (HOSPITAL_COMMUNITY): Payer: Self-pay | Admitting: Cardiovascular Disease

## 2019-07-23 DIAGNOSIS — I6523 Occlusion and stenosis of bilateral carotid arteries: Secondary | ICD-10-CM

## 2019-08-15 ENCOUNTER — Other Ambulatory Visit: Payer: Self-pay | Admitting: Legal Medicine

## 2019-08-15 DIAGNOSIS — I10 Essential (primary) hypertension: Secondary | ICD-10-CM

## 2019-08-16 ENCOUNTER — Other Ambulatory Visit: Payer: Self-pay | Admitting: Legal Medicine

## 2019-08-16 MED ORDER — FUROSEMIDE 20 MG PO TABS: 20.0000 mg | ORAL_TABLET | Freq: Every day | ORAL | 3 refills | Status: DC | PRN

## 2019-10-01 ENCOUNTER — Encounter: Payer: Self-pay | Admitting: Legal Medicine

## 2019-10-01 ENCOUNTER — Ambulatory Visit (INDEPENDENT_AMBULATORY_CARE_PROVIDER_SITE_OTHER): Payer: Medicare HMO | Admitting: Legal Medicine

## 2019-10-01 ENCOUNTER — Other Ambulatory Visit: Payer: Self-pay

## 2019-10-01 VITALS — BP 100/80 | HR 70 | Temp 97.8°F | Resp 17 | Ht 60.0 in | Wt 167.4 lb

## 2019-10-01 DIAGNOSIS — I739 Peripheral vascular disease, unspecified: Secondary | ICD-10-CM | POA: Diagnosis not present

## 2019-10-01 DIAGNOSIS — R5383 Other fatigue: Secondary | ICD-10-CM | POA: Diagnosis not present

## 2019-10-01 DIAGNOSIS — R69 Illness, unspecified: Secondary | ICD-10-CM | POA: Diagnosis not present

## 2019-10-01 DIAGNOSIS — Z6832 Body mass index (BMI) 32.0-32.9, adult: Secondary | ICD-10-CM | POA: Diagnosis not present

## 2019-10-01 DIAGNOSIS — N1831 Chronic kidney disease, stage 3a: Secondary | ICD-10-CM

## 2019-10-01 DIAGNOSIS — I350 Nonrheumatic aortic (valve) stenosis: Secondary | ICD-10-CM | POA: Diagnosis not present

## 2019-10-01 DIAGNOSIS — E559 Vitamin D deficiency, unspecified: Secondary | ICD-10-CM | POA: Diagnosis not present

## 2019-10-01 DIAGNOSIS — M25561 Pain in right knee: Secondary | ICD-10-CM | POA: Insufficient documentation

## 2019-10-01 DIAGNOSIS — E782 Mixed hyperlipidemia: Secondary | ICD-10-CM

## 2019-10-01 DIAGNOSIS — I10 Essential (primary) hypertension: Secondary | ICD-10-CM | POA: Diagnosis not present

## 2019-10-01 DIAGNOSIS — Z6830 Body mass index (BMI) 30.0-30.9, adult: Secondary | ICD-10-CM | POA: Insufficient documentation

## 2019-10-01 DIAGNOSIS — F321 Major depressive disorder, single episode, moderate: Secondary | ICD-10-CM

## 2019-10-01 MED ORDER — VENLAFAXINE HCL ER 37.5 MG PO CP24: 37.5000 mg | ORAL_CAPSULE | Freq: Every day | ORAL | 3 refills | Status: DC

## 2019-10-01 NOTE — Progress Notes (Signed)
Subjective:  Patient ID: [name], female    DOB: [birth year]  Age: 79 y.o. MRN: [id number]  Chief Complaint  Patient presents with  . Hypertension  . Depression    HPI: chronic visit  Patient presents for follow up of hypertension.  Patient tolerating norvasc, valsartan, lasix well with side effects.  Patient was diagnosed with hypertension 2010 so has been treated for hypertension for 10 years.Patient is working on maintaining diet and exercise regimen and follows up as directed. Complication include none.  This patient has major depression for 1 year.  PHQ9 =20.  Patient is having less anhedonia.  The patient has improved future plans and prospects.  The depression is worse with stress.  The patient is not  exercising and working on behavior to improve mental health.  Patient is not seeing a therapist or psychiatrist.  na  Patient is on sertraline.   Current Outpatient Medications on File Prior to Visit  Medication Sig Dispense Refill  . Acetaminophen (TYLENOL) 325 MG CAPS Take 650 mg by mouth every 6 (six) hours.    [name] amLODipine (NORVASC) 10 MG tablet Take 1 tablet (10 mg total) by mouth daily. 90 tablet 2  . aspirin 325 MG tablet Take 325 mg by mouth daily.    . cetirizine (ZYRTEC) 10 MG tablet Take 10 mg by mouth at bedtime.    . clopidogrel (PLAVIX) 75 MG tablet Take 1 tablet (75 mg total) by mouth daily. 30 tablet 0  . furosemide (LASIX) 20 MG tablet Take 1 tablet (20 mg total) by mouth daily as needed for fluid. 30 tablet 3  . latanoprost (XALATAN) 0.005 % ophthalmic solution 1 drop at bedtime.    . pantoprazole (PROTONIX) 40 MG tablet Take 1 tablet (40 mg total) by mouth 2 (two) times daily.    . valsartan (DIOVAN) 320 MG tablet TAKE 1 TABLET BY MOUTH EVERY DAY 90 tablet 2  . sertraline (ZOLOFT) 25 MG tablet Take 2 tablets (50 mg total) by mouth daily. (Patient not taking: Reported on 10/01/2019) 180 tablet 2   No current facility-administered medications on file prior to  visit.   Past Medical History:  Diagnosis Date  . Anemia   . Benign paroxysmal vertigo, unspecified ear 06/07/2019  . CKD (chronic kidney disease), stage III   . Depression   . GERD (gastroesophageal reflux disease)   . Glaucoma   . HTN (hypertension) 07/12/2011  . Hyperlipidemia   . Stroke [facility])    Past Surgical History:  Procedure Laterality Date  . ABDOMINAL HYSTERECTOMY    . BACK SURGERY    . cataract surgery    . COLONOSCOPY N/A 08/03/2015   Procedure: COLONOSCOPY;  Surgeon: Carman Ching, MD;  Location: [facility] ENDOSCOPY;  Service: Endoscopy;  Laterality: N/A;  . ESOPHAGOGASTRODUODENOSCOPY N/A 07/31/2015   Procedure: ESOPHAGOGASTRODUODENOSCOPY (EGD);  Surgeon: Carman Ching, MD;  Location[facility] ENDOSCOPY;  Service: Endoscopy;  Laterality: N/A;  . OOPHORECTOMY    . TOTAL HIP ARTHROPLASTY    . URETER SURGERY      Family History  Problem Relation Age of Onset  . Hypertension Mother   . Hypertension Father   . Heart attack Father   . Hypertension Brother   . Diabetes Brother    Social History   Socioeconomic History  . Marital status: Married    Spouse name: Not on file  . Number of children: Not on file  . Years of education: Not on file  . Highest education level: Not on  file  Occupational History  . Not on file  Tobacco Use  . Smoking status: Never Smoker  . Smokeless tobacco: Never Used  Vaping Use  . Vaping Use: Never used  Substance and Sexual Activity  . Alcohol use: No  . Drug use: No  . Sexual activity: Not Currently  Other Topics Concern  . Not on file  Social History Narrative  . Not on file   Social Determinants of Health   Financial Resource Strain:   . Difficulty of Paying Living Expenses:   Food Insecurity:   . Worried About Programme researcher, broadcasting/film/video in the Last Year:   . Barista in the Last Year:   Transportation Needs:   . Freight forwarder (Medical):   [name] Lack of Transportation (Non-Medical):   Physical Activity:   . Days of Exercise  per Week:   . Minutes of Exercise per Session:   Stress:   . Feeling of Stress :   Social Connections:   . Frequency of Communication with Friends and Family:   . Frequency of Social Gatherings with Friends and Family:   . Attends Religious Services:   . Active Member of Clubs or Organizations:   . Attends Banker Meetings:   [name] Marital Status:     Review of Systems  Constitutional: Negative.   HENT: Negative.   Eyes: Negative.   Respiratory: Negative.   Cardiovascular: Negative.   Gastrointestinal: Negative.   Endocrine: Negative.   Genitourinary: Negative.   Musculoskeletal: Negative.   Skin: Negative.   Neurological: Negative.   Psychiatric/Behavioral: Positive for dysphoric mood.     Objective:  BP 100/80 (BP Location: Right Arm, Patient Position: Sitting)   Pulse 70   Temp 97.8 F (36.6 C) (Temporal)   Resp 17   Ht 5' (1.524 m)   Wt 167 lb 6.4 oz (75.9 kg)   BMI 32.69 kg/m   BP/Weight 10/01/2019 06/08/2019 06/02/2019  Systolic BP 100 138 130  Diastolic BP 80 90 80  Wt. (Lbs) 167.4 178 176.4  BMI 32.69 37.36 34.45    Physical Exam Vitals reviewed.  Constitutional:      Appearance: Normal appearance.  HENT:     Head: Normocephalic and atraumatic.     Right Ear: Tympanic membrane normal.     Left Ear: Tympanic membrane normal.     Nose: Nose normal.     Mouth/Throat:     Mouth: Mucous membranes are dry.  Eyes:     Extraocular Movements: Extraocular movements intact.     Conjunctiva/sclera: Conjunctivae normal.     Pupils: Pupils are equal, round, and reactive to light.  Cardiovascular:     Rate and Rhythm: Normal rate and regular rhythm.     Pulses: Normal pulses.     Heart sounds: Normal heart sounds.  Pulmonary:     Effort: Pulmonary effort is normal.     Breath sounds: Normal breath sounds.  Abdominal:     General: Abdomen is flat.     Palpations: Abdomen is soft.  Musculoskeletal:        General: Normal range of motion.      Cervical back: Normal range of motion and neck supple.  Skin:    General: Skin is warm and dry.     Capillary Refill: Capillary refill takes 2 to 3 seconds.  Neurological:     General: No focal deficit present.     Mental Status: She is alert and oriented to person, place,  and time.    Depression screen [facility] 2/9 10/01/2019 06/02/2019 05/28/2019 08/07/2015 06/27/2015  Decreased Interest 3 2 1  0 0  Down, Depressed, Hopeless 3 1 1  0 0  PHQ - 2 Score 6 3 2  0 0  Altered sleeping 3 3 3  - -  Tired, decreased energy 3 3 3  - -  Change in appetite 3 2 3  - -  Feeling bad or failure about yourself  3 2 2  - -  Trouble concentrating 2 3 1  - -  Moving slowly or fidgety/restless 0 2 3 - -  Suicidal thoughts 0 0 0 - -  PHQ-9 Score 20 18 17  - -  Difficult doing work/chores Very difficult Somewhat difficult Somewhat difficult - -      Lab Results  Component Value Date   WBC 5.5 10/01/2019   HGB 13.1 10/01/2019   HCT 40.9 10/01/2019   PLT 323 10/01/2019   GLUCOSE 104 (H) 10/01/2019   CHOL 295 (H) 10/01/2019   TRIG 246 (H) 10/01/2019   HDL 38 (L) 10/01/2019   LDLCALC 208 (H) 10/01/2019   ALT 9 10/01/2019   AST 9 10/01/2019   NA 140 10/01/2019   K 4.3 10/01/2019   CL 105 10/01/2019   CREATININE 1.09 (H) 10/01/2019   BUN 19 10/01/2019   CO2 20 10/01/2019   TSH 2.310 10/01/2019   INR 1.22 07/31/2015   HGBA1C 5.6 08/01/2015      Assessment & Plan:   1. Mixed hyperlipidemia - Lipid Panel  2. Essential hypertension - CBC with Differential/Platelet - Comprehensive metabolic panel  3. Mild aortic stenosis  4. Stage 3a chronic kidney disease  5. BMI 32.0-32.9,adult  6. Vitamin D deficiency - VITAMIN D 25 Hydroxy (Vit-D Deficiency, Fractures)  7. Fatigue, unspecified type - TSH  8. PVD (peripheral vascular disease) (HCC) - Ankle Brachial Index Assessment  9. Depression, major, single episode, moderate (HCC) - venlafaxine XR (EFFEXOR XR) 37.5 MG 24 hr capsule; Take 1 capsule (37.5  mg total) by mouth daily with breakfast.  Dispense: 30 capsule; Refill: 3    Meds ordered this encounter  Medications  . venlafaxine XR (EFFEXOR XR) 37.5 MG 24 hr capsule    Sig: Take 1 capsule (37.5 mg total) by mouth daily with breakfast.    Dispense:  30 capsule    Refill:  3    Orders Placed This Encounter  Procedures  . CBC with Differential/Platelet  . Comprehensive metabolic panel  . TSH  . Lipid Panel  . VITAMIN D 25 Hydroxy (Vit-D Deficiency, Fractures)  . Cardiovascular Risk Assessment  . Ankle Brachial Index Assessment     Follow-up: Return in about 2 weeks (around 10/15/2019) for depression.  An After Visit Summary was printed and given to the patient.  12/02/2019 [facility] [phone]

## 2019-10-02 LAB — COMPREHENSIVE METABOLIC PANEL
ALT: 9 IU/L (ref 0–32)
AST: 9 IU/L (ref 0–40)
Albumin/Globulin Ratio: 1.7 (ref 1.2–2.2)
Albumin: 4.2 g/dL (ref 3.7–4.7)
Alkaline Phosphatase: 124 IU/L — ABNORMAL HIGH (ref 48–121)
BUN/Creatinine Ratio: 17 (ref 12–28)
BUN: 19 mg/dL (ref 8–27)
Bilirubin Total: 0.3 mg/dL (ref 0.0–1.2)
CO2: 20 mmol/L (ref 20–29)
Calcium: 10.2 mg/dL (ref 8.7–10.3)
Chloride: 105 mmol/L (ref 96–106)
Creatinine, Ser: 1.09 mg/dL — ABNORMAL HIGH (ref 0.57–1.00)
GFR calc Af Amer: 56 mL/min/{1.73_m2} — ABNORMAL LOW (ref >59)
GFR calc non Af Amer: 48 mL/min/{1.73_m2} — ABNORMAL LOW (ref >59)
Globulin, Total: 2.5 g/dL (ref 1.5–4.5)
Glucose: 104 mg/dL — ABNORMAL HIGH (ref 65–99)
Potassium: 4.3 mmol/L (ref 3.5–5.2)
Sodium: 140 mmol/L (ref 134–144)
Total Protein: 6.7 g/dL (ref 6.0–8.5)

## 2019-10-02 LAB — CBC WITH DIFFERENTIAL/PLATELET
Basophils Absolute: 0 10*3/uL (ref 0.0–0.2)
Basos: 1 %
EOS (ABSOLUTE): 0.2 10*3/uL (ref 0.0–0.4)
Eos: 3 %
Hematocrit: 40.9 % (ref 34.0–46.6)
Hemoglobin: 13.1 g/dL (ref 11.1–15.9)
Immature Grans (Abs): 0 10*3/uL (ref 0.0–0.1)
Immature Granulocytes: 0 %
Lymphocytes Absolute: 1.3 10*3/uL (ref 0.7–3.1)
Lymphs: 24 %
MCH: 29.9 pg (ref 26.6–33.0)
MCHC: 32 g/dL (ref 31.5–35.7)
MCV: 93 fL (ref 79–97)
Monocytes Absolute: 0.6 10*3/uL (ref 0.1–0.9)
Monocytes: 11 %
Neutrophils Absolute: 3.4 10*3/uL (ref 1.4–7.0)
Neutrophils: 61 %
Platelets: 323 10*3/uL (ref 150–450)
RBC: 4.38 x10E6/uL (ref 3.77–5.28)
RDW: 12.9 % (ref 11.7–15.4)
WBC: 5.5 10*3/uL (ref 3.4–10.8)

## 2019-10-02 LAB — LIPID PANEL
Chol/HDL Ratio: 7.8 ratio — ABNORMAL HIGH (ref 0.0–4.4)
Cholesterol, Total: 295 mg/dL — ABNORMAL HIGH (ref 100–199)
HDL: 38 mg/dL — ABNORMAL LOW (ref >39)
LDL Chol Calc (NIH): 208 mg/dL — ABNORMAL HIGH (ref 0–99)
Triglycerides: 246 mg/dL — ABNORMAL HIGH (ref 0–149)
VLDL Cholesterol Cal: 49 mg/dL — ABNORMAL HIGH (ref 5–40)

## 2019-10-02 LAB — TSH: TSH: 2.31 u[IU]/mL (ref 0.450–4.500)

## 2019-10-02 LAB — VITAMIN D 25 HYDROXY (VIT D DEFICIENCY, FRACTURES): Vit D, 25-Hydroxy: 8.8 ng/mL — ABNORMAL LOW (ref 30.0–100.0)

## 2019-10-02 LAB — CARDIOVASCULAR RISK ASSESSMENT

## 2019-10-02 NOTE — Progress Notes (Signed)
CBC normal, glucose 104, kidney tests stable, Liver tests normal, TSH 2.31 normal, Cholesterol very high LDL cholesterol 208, consider statin, Vitamin D 8.8 low.  Start vitamin d 4000 units a day. lp

## 2019-10-04 ENCOUNTER — Other Ambulatory Visit: Payer: Self-pay | Admitting: Legal Medicine

## 2019-10-08 DIAGNOSIS — M1712 Unilateral primary osteoarthritis, left knee: Secondary | ICD-10-CM | POA: Diagnosis not present

## 2019-10-08 DIAGNOSIS — M170 Bilateral primary osteoarthritis of knee: Secondary | ICD-10-CM | POA: Diagnosis not present

## 2019-10-08 DIAGNOSIS — M1711 Unilateral primary osteoarthritis, right knee: Secondary | ICD-10-CM | POA: Diagnosis not present

## 2019-10-10 ENCOUNTER — Other Ambulatory Visit: Payer: Self-pay | Admitting: Legal Medicine

## 2019-10-14 ENCOUNTER — Other Ambulatory Visit: Payer: Self-pay

## 2019-10-14 ENCOUNTER — Encounter: Payer: Self-pay | Admitting: Legal Medicine

## 2019-10-14 ENCOUNTER — Ambulatory Visit (INDEPENDENT_AMBULATORY_CARE_PROVIDER_SITE_OTHER): Payer: Medicare HMO | Admitting: Legal Medicine

## 2019-10-14 VITALS — BP 130/98 | HR 63 | Temp 97.4°F | Resp 17 | Ht 60.0 in | Wt 164.2 lb

## 2019-10-14 DIAGNOSIS — M4726 Other spondylosis with radiculopathy, lumbar region: Secondary | ICD-10-CM

## 2019-10-14 DIAGNOSIS — F322 Major depressive disorder, single episode, severe without psychotic features: Secondary | ICD-10-CM | POA: Diagnosis not present

## 2019-10-14 DIAGNOSIS — R69 Illness, unspecified: Secondary | ICD-10-CM | POA: Diagnosis not present

## 2019-10-14 MED ORDER — GABAPENTIN 100 MG PO CAPS: 100.0000 mg | ORAL_CAPSULE | Freq: Three times a day (TID) | ORAL | 3 refills | Status: DC

## 2019-10-14 NOTE — Progress Notes (Signed)
Subjective:  Patient ID: [name], female    DOB: 09-16-1940  Age: 79 y.o. MRN: [id number]  Chief Complaint  Patient presents with  . Depression    HPI follow up depression.  She is on Effexor at night  Patient presents for follow up of hypertension.  Patient tolerating amlodipine well with side effects.  Patient was diagnosed with hypertension 2010 so has been treated for hypertension for 10 years.Patient is working on maintaining diet and exercise regimen and follows up as directed. Complication include none.  This patient has major depression for 6 months.  PHQ9 =10.  Patient is having less anhedonia.  The patient has improved future plans and prospects.  The depression is worse with stress.  The patient is not exercising and working on behavior to improve mental health.  Patient is not seeing a therapist or psychiatrist.  na  Patient is on effexor.  Neuropathy both legs, she is seeing orthopedics.   Current Outpatient Medications on File Prior to Visit  Medication Sig Dispense Refill  . Acetaminophen (TYLENOL) 325 MG CAPS Take 650 mg by mouth every 6 (six) hours.    [name] amLODipine (NORVASC) 10 MG tablet Take 1 tablet (10 mg total) by mouth daily. 90 tablet 2  . aspirin 325 MG tablet Take 325 mg by mouth daily.    . cetirizine (ZYRTEC) 10 MG tablet Take 10 mg by mouth at bedtime.    . clopidogrel (PLAVIX) 75 MG tablet Take 1 tablet (75 mg total) by mouth daily. 30 tablet 0  . furosemide (LASIX) 20 MG tablet TAKE 1 TABLET BY MOUTH EVERY DAY AS NEEDED FOR FLUID 90 tablet 2  . latanoprost (XALATAN) 0.005 % ophthalmic solution 1 drop at bedtime.    . pantoprazole (PROTONIX) 40 MG tablet Take 1 tablet (40 mg total) by mouth 2 (two) times daily.    . valsartan (DIOVAN) 320 MG tablet TAKE 1 TABLET BY MOUTH EVERY DAY 90 tablet 2  . venlafaxine XR (EFFEXOR XR) 37.5 MG 24 hr capsule Take 1 capsule (37.5 mg total) by mouth daily with breakfast. 30 capsule 3   No current  facility-administered medications on file prior to visit.   Past Medical History:  Diagnosis Date  . Anemia   . Benign paroxysmal vertigo, unspecified ear 06/07/2019  . CKD (chronic kidney disease), stage III   . Depression   . GERD (gastroesophageal reflux disease)   . Glaucoma   . HTN (hypertension) 07/12/2011  . Hyperlipidemia   . Stroke [facility])    Past Surgical History:  Procedure Laterality Date  . ABDOMINAL HYSTERECTOMY    . BACK SURGERY    . cataract surgery    . COLONOSCOPY N/A 08/03/2015   Procedure: COLONOSCOPY;  Surgeon: Carman Ching, MD;  Location: [facility] ENDOSCOPY;  Service: Endoscopy;  Laterality: N/A;  . ESOPHAGOGASTRODUODENOSCOPY N/A 07/31/2015   Procedure: ESOPHAGOGASTRODUODENOSCOPY (EGD);  Surgeon: Carman Ching, MD;  Location: [facility] ENDOSCOPY;  Service: Endoscopy;  Laterality: N/A;  . OOPHORECTOMY    . TOTAL HIP ARTHROPLASTY    . URETER SURGERY      Family History  Problem Relation Age of Onset  . Hypertension Mother   . Hypertension Father   . Heart attack Father   . Hypertension Brother   . Diabetes Brother    Social History   Socioeconomic History  . Marital status: Married    Spouse name: Not on file  . Number of children: Not on file  . Years of education: Not on  file  . Highest education level: Not on file  Occupational History  . Not on file  Tobacco Use  . Smoking status: Never Smoker  . Smokeless tobacco: Never Used  Vaping Use  . Vaping Use: Never used  Substance and Sexual Activity  . Alcohol use: No  . Drug use: No  . Sexual activity: Not Currently  Other Topics Concern  . Not on file  Social History Narrative  . Not on file   Social Determinants of Health   Financial Resource Strain:   . Difficulty of Paying Living Expenses:   Food Insecurity:   . Worried About Programme researcher, broadcasting/film/video in the Last Year:   . Barista in the Last Year:   Transportation Needs:   . Freight forwarder (Medical):   [name] Lack of Transportation  (Non-Medical):   Physical Activity:   . Days of Exercise per Week:   . Minutes of Exercise per Session:   Stress:   . Feeling of Stress :   Social Connections:   . Frequency of Communication with Friends and Family:   . Frequency of Social Gatherings with Friends and Family:   . Attends Religious Services:   . Active Member of Clubs or Organizations:   . Attends Banker Meetings:   [name] Marital Status:     Review of Systems  Constitutional: Negative.   HENT: Negative.   Cardiovascular: Negative.   Gastrointestinal: Negative.   Musculoskeletal: Positive for arthralgias and back pain.  Skin: Negative.   Psychiatric/Behavioral: Negative.      Objective:  BP (!) 130/98 (BP Location: Right Arm, Patient Position: Sitting)   Pulse 63   Temp (!) 97.4 F (36.3 C) (Temporal)   Resp 17   Ht 5' (1.524 m)   Wt 164 lb 3.2 oz (74.5 kg)   SpO2 94%   BMI 32.07 kg/m   BP/Weight 10/14/2019 10/01/2019 06/08/2019  Systolic BP 130 100 138  Diastolic BP 98 80 90  Wt. (Lbs) 164.2 167.4 178  BMI 32.07 32.69 37.36    Physical Exam Vitals reviewed.  Constitutional:      Appearance: Normal appearance.  Eyes:     Extraocular Movements: Extraocular movements intact.     Conjunctiva/sclera: Conjunctivae normal.     Pupils: Pupils are equal, round, and reactive to light.  Cardiovascular:     Rate and Rhythm: Normal rate and regular rhythm.     Pulses: Normal pulses.     Heart sounds: Normal heart sounds.  Pulmonary:     Effort: Pulmonary effort is normal.     Breath sounds: Normal breath sounds.  Musculoskeletal:     Cervical back: Normal range of motion.     Lumbar back: Spasms and tenderness present. Negative right straight leg raise test and negative left straight leg raise test.       Back:  Neurological:     Mental Status: She is alert.  Psychiatric:     Comments: Improved mood      Office Visit from 10/14/2019 in [facility] Total Score 1         Lab Results  Component Value Date   WBC 5.5 10/01/2019   HGB 13.1 10/01/2019   HCT 40.9 10/01/2019   PLT 323 10/01/2019   GLUCOSE 104 (H) 10/01/2019   CHOL 295 (H) 10/01/2019   TRIG 246 (H) 10/01/2019   HDL 38 (L) 10/01/2019   LDLCALC 208 (H) 10/01/2019   ALT 9  10/01/2019   AST 9 10/01/2019   NA 140 10/01/2019   K 4.3 10/01/2019   CL 105 10/01/2019   CREATININE 1.09 (H) 10/01/2019   BUN 19 10/01/2019   CO2 20 10/01/2019   TSH 2.310 10/01/2019   INR 1.22 07/31/2015   HGBA1C 5.6 08/01/2015      Assessment & Plan:   1. Other spondylosis with radiculopathy, lumbar region - Ambulatory referral to Orthopedic Surgery - gabapentin (NEURONTIN) 100 MG capsule; Take 1 capsule (100 mg total) by mouth 3 (three) times daily.  Dispense: 90 capsule; Refill: 3 Patient has chronic back pain, will refer to back specialist for possible epidurals 2. Depression, major, single episode, severe (HCC) Patient's depression is improved control with venlafacine.   Anhedonia better.  PHQ 9 was performed score 10. An individual care plan was established or reinforced today.  The patient's disease status was assessed using clinical findings on exam, labs, and or other diagnostic testing to determine patient's success in meeting treatment goals based on disease specific evidence-based guidelines and found to be improving Recommendations include stay on medicine    Meds ordered this encounter  Medications  . gabapentin (NEURONTIN) 100 MG capsule    Sig: Take 1 capsule (100 mg total) by mouth 3 (three) times daily.    Dispense:  90 capsule    Refill:  3    Orders Placed This Encounter  Procedures  . Ambulatory referral to Orthopedic Surgery     Follow-up: Return in about 1 month (around 11/14/2019) for depression.  An After Visit Summary was printed and given to the patient.  [name] [phone]

## 2019-10-23 ENCOUNTER — Other Ambulatory Visit: Payer: Self-pay | Admitting: Legal Medicine

## 2019-10-23 DIAGNOSIS — F321 Major depressive disorder, single episode, moderate: Secondary | ICD-10-CM

## 2019-10-24 ENCOUNTER — Other Ambulatory Visit: Payer: Self-pay | Admitting: Legal Medicine

## 2019-10-27 DIAGNOSIS — S0990XA Unspecified injury of head, initial encounter: Secondary | ICD-10-CM | POA: Diagnosis not present

## 2019-10-27 DIAGNOSIS — R079 Chest pain, unspecified: Secondary | ICD-10-CM | POA: Diagnosis not present

## 2019-10-27 DIAGNOSIS — I447 Left bundle-branch block, unspecified: Secondary | ICD-10-CM | POA: Diagnosis not present

## 2019-10-27 DIAGNOSIS — E782 Mixed hyperlipidemia: Secondary | ICD-10-CM | POA: Diagnosis not present

## 2019-10-27 DIAGNOSIS — R231 Pallor: Secondary | ICD-10-CM | POA: Diagnosis not present

## 2019-10-27 DIAGNOSIS — I959 Hypotension, unspecified: Secondary | ICD-10-CM | POA: Diagnosis not present

## 2019-10-27 DIAGNOSIS — I700 Atherosclerosis of aorta: Secondary | ICD-10-CM | POA: Diagnosis not present

## 2019-10-27 DIAGNOSIS — I739 Peripheral vascular disease, unspecified: Secondary | ICD-10-CM | POA: Diagnosis not present

## 2019-10-27 DIAGNOSIS — R778 Other specified abnormalities of plasma proteins: Secondary | ICD-10-CM | POA: Diagnosis not present

## 2019-10-27 DIAGNOSIS — I130 Hypertensive heart and chronic kidney disease with heart failure and stage 1 through stage 4 chronic kidney disease, or unspecified chronic kidney disease: Secondary | ICD-10-CM | POA: Diagnosis not present

## 2019-10-27 DIAGNOSIS — R000 Tachycardia, unspecified: Secondary | ICD-10-CM | POA: Diagnosis not present

## 2019-10-27 DIAGNOSIS — I69393 Ataxia following cerebral infarction: Secondary | ICD-10-CM | POA: Diagnosis not present

## 2019-10-27 DIAGNOSIS — X30XXXA Exposure to excessive natural heat, initial encounter: Secondary | ICD-10-CM | POA: Diagnosis not present

## 2019-10-27 DIAGNOSIS — N183 Chronic kidney disease, stage 3 unspecified: Secondary | ICD-10-CM | POA: Diagnosis not present

## 2019-10-27 DIAGNOSIS — G319 Degenerative disease of nervous system, unspecified: Secondary | ICD-10-CM | POA: Diagnosis not present

## 2019-10-27 DIAGNOSIS — Z20822 Contact with and (suspected) exposure to covid-19: Secondary | ICD-10-CM | POA: Diagnosis not present

## 2019-10-27 DIAGNOSIS — R69 Illness, unspecified: Secondary | ICD-10-CM | POA: Diagnosis not present

## 2019-10-27 DIAGNOSIS — E860 Dehydration: Secondary | ICD-10-CM | POA: Diagnosis not present

## 2019-10-27 DIAGNOSIS — R531 Weakness: Secondary | ICD-10-CM | POA: Diagnosis not present

## 2019-10-27 DIAGNOSIS — G9389 Other specified disorders of brain: Secondary | ICD-10-CM | POA: Diagnosis not present

## 2019-10-28 DIAGNOSIS — R531 Weakness: Secondary | ICD-10-CM | POA: Diagnosis not present

## 2019-10-28 DIAGNOSIS — I5030 Unspecified diastolic (congestive) heart failure: Secondary | ICD-10-CM | POA: Diagnosis not present

## 2019-10-28 DIAGNOSIS — R112 Nausea with vomiting, unspecified: Secondary | ICD-10-CM | POA: Diagnosis not present

## 2019-10-28 DIAGNOSIS — N183 Chronic kidney disease, stage 3 unspecified: Secondary | ICD-10-CM | POA: Diagnosis not present

## 2019-10-28 DIAGNOSIS — I10 Essential (primary) hypertension: Secondary | ICD-10-CM | POA: Diagnosis present

## 2019-10-28 DIAGNOSIS — K449 Diaphragmatic hernia without obstruction or gangrene: Secondary | ICD-10-CM | POA: Diagnosis present

## 2019-10-28 DIAGNOSIS — Z7902 Long term (current) use of antithrombotics/antiplatelets: Secondary | ICD-10-CM | POA: Diagnosis not present

## 2019-10-28 DIAGNOSIS — R778 Other specified abnormalities of plasma proteins: Secondary | ICD-10-CM | POA: Diagnosis not present

## 2019-10-28 DIAGNOSIS — I5032 Chronic diastolic (congestive) heart failure: Secondary | ICD-10-CM | POA: Diagnosis not present

## 2019-10-28 DIAGNOSIS — I639 Cerebral infarction, unspecified: Secondary | ICD-10-CM | POA: Diagnosis not present

## 2019-10-28 DIAGNOSIS — N1831 Chronic kidney disease, stage 3a: Secondary | ICD-10-CM | POA: Diagnosis present

## 2019-10-28 DIAGNOSIS — R69 Illness, unspecified: Secondary | ICD-10-CM | POA: Diagnosis not present

## 2019-10-28 DIAGNOSIS — K922 Gastrointestinal hemorrhage, unspecified: Secondary | ICD-10-CM | POA: Insufficient documentation

## 2019-10-28 DIAGNOSIS — R7989 Other specified abnormal findings of blood chemistry: Secondary | ICD-10-CM | POA: Diagnosis not present

## 2019-10-28 DIAGNOSIS — I130 Hypertensive heart and chronic kidney disease with heart failure and stage 1 through stage 4 chronic kidney disease, or unspecified chronic kidney disease: Secondary | ICD-10-CM | POA: Diagnosis not present

## 2019-10-28 DIAGNOSIS — K219 Gastro-esophageal reflux disease without esophagitis: Secondary | ICD-10-CM | POA: Diagnosis not present

## 2019-11-04 ENCOUNTER — Ambulatory Visit (INDEPENDENT_AMBULATORY_CARE_PROVIDER_SITE_OTHER): Payer: Medicare HMO | Admitting: Legal Medicine

## 2019-11-04 ENCOUNTER — Encounter: Payer: Self-pay | Admitting: Legal Medicine

## 2019-11-04 ENCOUNTER — Other Ambulatory Visit: Payer: Self-pay

## 2019-11-04 VITALS — BP 150/86 | HR 143 | Temp 97.6°F | Resp 16 | Ht 60.0 in | Wt 168.8 lb

## 2019-11-04 DIAGNOSIS — T675XXA Heat exhaustion, unspecified, initial encounter: Secondary | ICD-10-CM | POA: Insufficient documentation

## 2019-11-04 DIAGNOSIS — Z6832 Body mass index (BMI) 32.0-32.9, adult: Secondary | ICD-10-CM

## 2019-11-04 NOTE — Progress Notes (Signed)
Subjective:  Patient ID: [name], female    DOB: [birth year]  Age: 79 y.o. MRN: [id number]  Chief Complaint  Patient presents with  . High Triponin    HPI: transition of care .  Patient admitted 10/27/2019 with heat exhaustion and syncope and elevated troponins.  Cardiac workup was negative.  She was rehydrated and discharged on 10/28/2019.  She has been doing well and gaining her energy.  No chest pains.     Current Outpatient Medications on File Prior to Visit  Medication Sig Dispense Refill  . Acetaminophen (TYLENOL) 325 MG CAPS Take 650 mg by mouth every 6 (six) hours.    [name] amLODipine (NORVASC) 10 MG tablet Take 1 tablet (10 mg total) by mouth daily. 90 tablet 2  . aspirin 325 MG tablet Take 325 mg by mouth daily.    . cetirizine (ZYRTEC) 10 MG tablet Take 10 mg by mouth at bedtime.    . clopidogrel (PLAVIX) 75 MG tablet Take 1 tablet (75 mg total) by mouth daily. 30 tablet 0  . furosemide (LASIX) 20 MG tablet TAKE 1 TABLET BY MOUTH EVERY DAY AS NEEDED FOR FLUID 90 tablet 2  . gabapentin (NEURONTIN) 100 MG capsule Take 1 capsule (100 mg total) by mouth 3 (three) times daily. 90 capsule 3  . latanoprost (XALATAN) 0.005 % ophthalmic solution 1 drop at bedtime.    . pantoprazole (PROTONIX) 40 MG tablet Take 1 tablet (40 mg total) by mouth 2 (two) times daily.    . valsartan (DIOVAN) 320 MG tablet TAKE 1 TABLET BY MOUTH EVERY DAY 90 tablet 2  . venlafaxine XR (EFFEXOR-XR) 37.5 MG 24 hr capsule TAKE 1 CAPSULE BY MOUTH DAILY WITH BREAKFAST. 90 capsule 2   No current facility-administered medications on file prior to visit.   Past Medical History:  Diagnosis Date  . Benign paroxysmal vertigo, unspecified ear 06/07/2019  . CKD (chronic kidney disease), stage III   . GERD (gastroesophageal reflux disease)   . Glaucoma   . HTN (hypertension) 07/12/2011  . Stroke [facility])    Past Surgical History:  Procedure Laterality Date  . ABDOMINAL HYSTERECTOMY    . BACK SURGERY    . cataract  surgery    . COLONOSCOPY N/A 08/03/2015   Procedure: COLONOSCOPY;  Surgeon: Carman Ching, MD;  Location: [facility] ENDOSCOPY;  Service: Endoscopy;  Laterality: N/A;  . ESOPHAGOGASTRODUODENOSCOPY N/A 07/31/2015   Procedure: ESOPHAGOGASTRODUODENOSCOPY (EGD);  Surgeon: Carman Ching, MD;  Location: [facility] ENDOSCOPY;  Service: Endoscopy;  Laterality: N/A;  . OOPHORECTOMY    . TOTAL HIP ARTHROPLASTY    . URETER SURGERY      Family History  Problem Relation Age of Onset  . Hypertension Mother   . Hypertension Father   . Heart attack Father   . Hypertension Brother   . Diabetes Brother    Social History   Socioeconomic History  . Marital status: Married    Spouse name: Not on file  . Number of children: Not on file  . Years of education: Not on file  . Highest education level: Not on file  Occupational History  . Not on file  Tobacco Use  . Smoking status: Never Smoker  . Smokeless tobacco: Never Used  Vaping Use  . Vaping Use: Never used  Substance and Sexual Activity  . Alcohol use: No  . Drug use: No  . Sexual activity: Not Currently  Other Topics Concern  . Not on file  Social History Narrative  . Not  on file   Social Determinants of Health   Financial Resource Strain:   . Difficulty of Paying Living Expenses:   Food Insecurity:   . Worried About Programme researcher, broadcasting/film/video in the Last Year:   . Barista in the Last Year:   Transportation Needs:   . Freight forwarder (Medical):   [name] Lack of Transportation (Non-Medical):   Physical Activity:   . Days of Exercise per Week:   . Minutes of Exercise per Session:   Stress:   . Feeling of Stress :   Social Connections:   . Frequency of Communication with Friends and Family:   . Frequency of Social Gatherings with Friends and Family:   . Attends Religious Services:   . Active Member of Clubs or Organizations:   . Attends Banker Meetings:   [name] Marital Status:     Review of Systems  Constitutional: Negative.     HENT: Negative.   Eyes: Negative.   Respiratory: Negative.   Cardiovascular: Negative.   Gastrointestinal: Negative.   Endocrine: Negative.   Genitourinary: Negative.   Musculoskeletal: Negative.   Neurological: Negative.   Psychiatric/Behavioral: Negative.      Objective:  BP (!) 150/86   Pulse (!) 143   Temp 97.6 F (36.4 C)   Resp 16   Ht 5' (1.524 m)   Wt 168 lb 12.8 oz (76.6 kg)   SpO2 (!) 85%   BMI 32.97 kg/m   BP/Weight 11/04/2019 10/14/2019 10/01/2019  Systolic BP 150 130 100  Diastolic BP 86 98 80  Wt. (Lbs) 168.8 164.2 167.4  BMI 32.97 32.07 32.69    Physical Exam Vitals reviewed.  Constitutional:      Appearance: Normal appearance.  HENT:     Right Ear: Tympanic membrane normal.     Left Ear: Tympanic membrane normal.     Mouth/Throat:     Mouth: Mucous membranes are moist.  Eyes:     Extraocular Movements: Extraocular movements intact.     Conjunctiva/sclera: Conjunctivae normal.     Pupils: Pupils are equal, round, and reactive to light.  Cardiovascular:     Rate and Rhythm: Normal rate and regular rhythm.     Pulses: Normal pulses.     Heart sounds: Normal heart sounds.  Pulmonary:     Effort: Pulmonary effort is normal.     Breath sounds: Normal breath sounds.  Abdominal:     General: Abdomen is flat. Bowel sounds are normal.     Palpations: Abdomen is soft.  Musculoskeletal:        General: Normal range of motion.     Cervical back: Normal range of motion and neck supple.  Skin:    General: Skin is warm and dry.     Capillary Refill: Capillary refill takes less than 2 seconds.  Neurological:     General: No focal deficit present.     Mental Status: She is alert and oriented to person, place, and time. Mental status is at baseline.  Psychiatric:        Mood and Affect: Mood normal.        Thought Content: Thought content normal.        Judgment: Judgment normal.    Lab Results  Component Value Date   WBC 5.5 10/01/2019   HGB 13.1  10/01/2019   HCT 40.9 10/01/2019   PLT 323 10/01/2019   GLUCOSE 104 (H) 10/01/2019   CHOL 295 (H) 10/01/2019   TRIG  246 (H) 10/01/2019   HDL 38 (L) 10/01/2019   LDLCALC 208 (H) 10/01/2019   ALT 9 10/01/2019   AST 9 10/01/2019   NA 140 10/01/2019   K 4.3 10/01/2019   CL 105 10/01/2019   CREATININE 1.09 (H) 10/01/2019   BUN 19 10/01/2019   CO2 20 10/01/2019   TSH 2.310 10/01/2019   INR 1.22 07/31/2015   HGBA1C 5.6 08/01/2015      Assessment & Plan:   1. Heat exhaustion, initial encounter Patient was admitted for heat exhaustion and cardiac causes were ruled out.  She is to stay out of sun until recovered        Follow-up: No follow-ups on file.  An After Visit Summary was printed and given to the patient.  [name] [phone]

## 2019-11-15 ENCOUNTER — Ambulatory Visit: Payer: Medicare HMO | Admitting: Legal Medicine

## 2019-12-22 DIAGNOSIS — H401131 Primary open-angle glaucoma, bilateral, mild stage: Secondary | ICD-10-CM | POA: Diagnosis not present

## 2019-12-31 DIAGNOSIS — H524 Presbyopia: Secondary | ICD-10-CM | POA: Diagnosis not present

## 2019-12-31 DIAGNOSIS — H52223 Regular astigmatism, bilateral: Secondary | ICD-10-CM | POA: Diagnosis not present

## 2020-01-04 NOTE — Progress Notes (Signed)
CARDIOLOGY OFFICE NOTE  Date:  01/14/2020    [name] Date of Birth: 20-Apr-1940 Medical Record #[id number]  PCP:  [name], MD  Cardiologist:  Eden Emms    No chief complaint on file.   History of Present Illness:  79 y.o. History of HTN, LBBB, HLD. 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 Echo with EF 60-65% MRI with multiple infarcts in pons, cerebellar peduncle and significant intracranial vascular disease. Neuro suggested Permissive HTN and ASA/Plavix. 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 Intolerant to statins And was on niacin. Carotids have been variable but most recent duplex 05/25/19  only 40-59% LICA stenosis   Still with difficulty walking due to stroke and bad arthritic knees  She has had a SEM and echo 05/25/19 showed AV sclerosis with mean gradient only 10 mmhg and AVA> 2 cm2  Admitted to [facility] August 2021 with dehydration , syncope and elevated troponin. Did fine with hydration ECG with chronic LBBB and echo with normal EF no RWAM's or valve dx and no pericardial effusion   She takes Effexor for depression   Discussed need to Rx LdL which is over 200 Options include PSK9 and nexlitol willing to try if Not cost prohibitive   Has some LLE swelling and pain    Past Medical History:  Diagnosis Date  . Benign paroxysmal vertigo, unspecified ear 06/07/2019  . CKD (chronic kidney disease), stage III (HCC)   . GERD (gastroesophageal reflux disease)   . Glaucoma     Past Surgical History:  Procedure Laterality Date  . ABDOMINAL HYSTERECTOMY    . BACK SURGERY    . cataract surgery    . COLONOSCOPY N/A 08/03/2015   Procedure: COLONOSCOPY;  Surgeon: Carman Ching, MD;  Location: [facility] ENDOSCOPY;  Service: Endoscopy;  Laterality: N/A;  . ESOPHAGOGASTRODUODENOSCOPY N/A 07/31/2015   Procedure: ESOPHAGOGASTRODUODENOSCOPY (EGD);  Surgeon: Carman Ching, MD;  Location: [facility] ENDOSCOPY;  Service: Endoscopy;   Laterality: N/A;  . OOPHORECTOMY    . TOTAL HIP ARTHROPLASTY    . URETER SURGERY       Medications: Current Outpatient Medications  Medication Sig Dispense Refill  . Acetaminophen (TYLENOL) 325 MG CAPS Take 650 mg by mouth every 6 (six) hours.    [name] amLODipine (NORVASC) 10 MG tablet Take 1 tablet (10 mg total) by mouth daily. 90 tablet 2  . aspirin 325 MG tablet Take 325 mg by mouth daily.    . cetirizine (ZYRTEC) 10 MG tablet Take 10 mg by mouth at bedtime.    . clopidogrel (PLAVIX) 75 MG tablet TAKE 1 TABLET BY MOUTH EVERY DAY 90 tablet 2  . furosemide (LASIX) 20 MG tablet TAKE 1 TABLET BY MOUTH EVERY DAY AS NEEDED FOR FLUID 90 tablet 2  . gabapentin (NEURONTIN) 100 MG capsule Take 1 capsule (100 mg total) by mouth 3 (three) times daily. 90 capsule 3  . latanoprost (XALATAN) 0.005 % ophthalmic solution 1 drop at bedtime.    . pantoprazole (PROTONIX) 40 MG tablet Take 1 tablet (40 mg total) by mouth 2 (two) times daily.    . valsartan (DIOVAN) 320 MG tablet TAKE 1 TABLET BY MOUTH EVERY DAY 90 tablet 2  . venlafaxine XR (EFFEXOR-XR) 37.5 MG 24 hr capsule TAKE 1 CAPSULE BY MOUTH DAILY WITH BREAKFAST. 90 capsule 2   No current facility-administered medications for this visit.    Allergies: Allergies  Allergen Reactions  . Codeine Nausea And Vomiting  Nausea   . Meclizine Other (See Comments)    Lethargy, extreme sleepiness  . Requip [Ropinirole Hcl] Nausea And Vomiting  . Statins     Muscle weakness    Social History: The patient  reports that she has never smoked. She has never used smokeless tobacco. She reports that she does not drink alcohol and does not use drugs.   Family History: The patient's family history includes Diabetes in her brother; Heart attack in her father; Hypertension in her brother, father, and mother.   Review of Systems: Please see the history of present illness.   Otherwise, the review of systems is positive for none.   All other systems are  reviewed and negative.   Physical Exam: VS:  BP 138/66   Pulse 83   Ht 5\' 1"  (1.549 m)   Wt 169 lb 12.8 oz (77 kg)   SpO2 96%   BMI 32.08 kg/m  .  BMI Body mass index is 32.08 kg/m.  Wt Readings from Last 3 Encounters:  01/14/20 169 lb 12.8 oz (77 kg)  01/12/20 169 lb 6.4 oz (76.8 kg)  11/04/19 168 lb 12.8 oz (76.6 kg)    Affect appropriate Healthy:  appears stated age HEENT: normal Neck supple with no adenopathy JVP normal bilateral carotid  Bruits high pitched on left  no thyromegaly Lungs clear with no wheezing and good diaphragmatic motion Heart:  S1/S2  SEM murmur, no rub, gallop or click PMI normal Abdomen: benighn, BS positve, no tenderness, no AAA no bruit.  No HSM or HJR Distal pulses intact with no bruits No edema Neuro non-focal Skin warm and dry No muscular weakness    LABORATORY DATA:  EKG: 08/02/15   NSR with LBBB. 05/25/18 SR rate 75 LBBB LAD no changes   Lab Results  Component Value Date   WBC 7.1 01/12/2020   HGB 13.0 01/12/2020   HCT 38.7 01/12/2020   PLT 352 01/12/2020   GLUCOSE 102 (H) 01/12/2020   CHOL 296 (H) 01/12/2020   TRIG 217 (H) 01/12/2020   HDL 40 01/12/2020   LDLCALC 213 (H) 01/12/2020   ALT 7 01/12/2020   AST 8 01/12/2020   NA 141 01/12/2020   K 4.1 01/12/2020   CL 104 01/12/2020   CREATININE 1.02 (H) 01/12/2020   BUN 15 01/12/2020   CO2 22 01/12/2020   TSH 2.180 01/12/2020   INR 1.22 07/31/2015   HGBA1C 5.6 08/01/2015    BNP (last 3 results) No results for input(s): BNP in the last 8760 hours.  ProBNP (last 3 results) No results for input(s): PROBNP in the last 8760 hours.   Other Studies Reviewed Today:  Echo Study Conclusions from 09/2014  - Left ventricle: The cavity size was normal. Wall thickness was  increased in a pattern of mild LVH. Systolic function was normal.  The estimated ejection fraction was in the range of 60% to 65%.  Doppler parameters are consistent with abnormal left ventricular   relaxation (grade 1 diastolic dysfunction).  Assessment/Plan: 1. CVA:  DAT f/u neurology duplex left carotid 05/25/19  40-59% LICA f/u March 2022   2. 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 HLD intolerant to statins was on niacin refer to lipid clinic to start PSK9 if not cost prohibitive   4. Chronic LBBB stable no high grade AV block   5. Murmur:  TTE reviewed 05/25/19 AV sclerosis mean gradient 10 mmHg  no stenosis EF normal 60-65%    TTE and carotids March 2022 Lipid Clinic start PSK9 F/U in a year     Charlton Haws

## 2020-01-05 ENCOUNTER — Other Ambulatory Visit: Payer: Self-pay | Admitting: Legal Medicine

## 2020-01-12 ENCOUNTER — Ambulatory Visit (INDEPENDENT_AMBULATORY_CARE_PROVIDER_SITE_OTHER): Payer: Medicare HMO | Admitting: Legal Medicine

## 2020-01-12 ENCOUNTER — Encounter: Payer: Self-pay | Admitting: Legal Medicine

## 2020-01-12 ENCOUNTER — Other Ambulatory Visit: Payer: Self-pay

## 2020-01-12 VITALS — BP 138/80 | HR 86 | Temp 97.7°F | Resp 16 | Ht 60.0 in | Wt 169.4 lb

## 2020-01-12 DIAGNOSIS — E782 Mixed hyperlipidemia: Secondary | ICD-10-CM

## 2020-01-12 DIAGNOSIS — I10 Essential (primary) hypertension: Secondary | ICD-10-CM | POA: Diagnosis not present

## 2020-01-12 DIAGNOSIS — R5383 Other fatigue: Secondary | ICD-10-CM

## 2020-01-12 DIAGNOSIS — N1831 Chronic kidney disease, stage 3a: Secondary | ICD-10-CM | POA: Diagnosis not present

## 2020-01-12 NOTE — Progress Notes (Signed)
Subjective:  Patient ID: [name], female    DOB: 1940/06/24  Age: 79 y.o. MRN: [id number]  Chief Complaint  Patient presents with  . Hypertension    3 month follow up, pt is fasting    HPI: chronic visit  Patient presents for follow up of hypertension.  Patient tolerating amlodipine, valsartan well with side effects.  Patient was diagnosed with hypertension 2010 so has been treated for hypertension for 10 years.Patient is working on maintaining diet and exercise regimen and follows up as directed. Complication include none.  Patient presents with hyperlipidemia.  Compliance with treatment has been good; patient takes medicines as directed, maintains low cholesterol diet, follows up as directed, and maintains exercise regimen.  Patient is using diet without problems.  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 They are stable.  He is coping with this deficit by cane. The patient is continuing treatment yes.   Current Outpatient Medications on File Prior to Visit  Medication Sig Dispense Refill  . Acetaminophen (TYLENOL) 325 MG CAPS Take 650 mg by mouth every 6 (six) hours.    [name] amLODipine (NORVASC) 10 MG tablet Take 1 tablet (10 mg total) by mouth daily. 90 tablet 2  . aspirin 325 MG tablet Take 325 mg by mouth daily.    . cetirizine (ZYRTEC) 10 MG tablet Take 10 mg by mouth at bedtime.    . clopidogrel (PLAVIX) 75 MG tablet TAKE 1 TABLET BY MOUTH EVERY DAY 90 tablet 2  . furosemide (LASIX) 20 MG tablet TAKE 1 TABLET BY MOUTH EVERY DAY AS NEEDED FOR FLUID 90 tablet 2  . gabapentin (NEURONTIN) 100 MG capsule Take 1 capsule (100 mg total) by mouth 3 (three) times daily. 90 capsule 3  . latanoprost (XALATAN) 0.005 % ophthalmic solution 1 drop at bedtime.    . pantoprazole (PROTONIX) 40 MG tablet Take 1 tablet (40 mg total) by mouth 2 (two) times daily.    . valsartan (DIOVAN) 320 MG tablet TAKE 1 TABLET BY MOUTH EVERY DAY 90 tablet 2  . venlafaxine XR  (EFFEXOR-XR) 37.5 MG 24 hr capsule TAKE 1 CAPSULE BY MOUTH DAILY WITH BREAKFAST. 90 capsule 2   No current facility-administered medications on file prior to visit.   Past Medical History:  Diagnosis Date  . Benign paroxysmal vertigo, unspecified ear 06/07/2019  . CKD (chronic kidney disease), stage III (HCC)   . GERD (gastroesophageal reflux disease)   . Glaucoma    Past Surgical History:  Procedure Laterality Date  . ABDOMINAL HYSTERECTOMY    . BACK SURGERY    . cataract surgery    . COLONOSCOPY N/A 08/03/2015   Procedure: COLONOSCOPY;  Surgeon: Carman Ching, MD;  Location: [facility] ENDOSCOPY;  Service: Endoscopy;  Laterality: N/A;  . ESOPHAGOGASTRODUODENOSCOPY N/A 07/31/2015   Procedure: ESOPHAGOGASTRODUODENOSCOPY (EGD);  Surgeon: Carman Ching, MD;  Location: [facility] ENDOSCOPY;  Service: Endoscopy;  Laterality: N/A;  . OOPHORECTOMY    . TOTAL HIP ARTHROPLASTY    . URETER SURGERY      Family History  Problem Relation Age of Onset  . Hypertension Mother   . Hypertension Father   . Heart attack Father   . Hypertension Brother   . Diabetes Brother    Social History   Socioeconomic History  . Marital status: Married    Spouse name: Not on file  . Number of children: Not on file  . Years of education: Not on file  . Highest education level: Not  on file  Occupational History  . Not on file  Tobacco Use  . Smoking status: Never Smoker  . Smokeless tobacco: Never Used  Vaping Use  . Vaping Use: Never used  Substance and Sexual Activity  . Alcohol use: No  . Drug use: No  . Sexual activity: Not Currently  Other Topics Concern  . Not on file  Social History Narrative  . Not on file   Social Determinants of Health   Financial Resource Strain:   . Difficulty of Paying Living Expenses: Not on file  Food Insecurity:   . Worried About Programme researcher, broadcasting/film/video in the Last Year: Not on file  . Ran Out of Food in the Last Year: Not on file  Transportation Needs:   . Lack of  Transportation (Medical): Not on file  . Lack of Transportation (Non-Medical): Not on file  Physical Activity:   . Days of Exercise per Week: Not on file  . Minutes of Exercise per Session: Not on file  Stress:   . Feeling of Stress : Not on file  Social Connections:   . Frequency of Communication with Friends and Family: Not on file  . Frequency of Social Gatherings with Friends and Family: Not on file  . Attends Religious Services: Not on file  . Active Member of Clubs or Organizations: Not on file  . Attends Banker Meetings: Not on file  . Marital Status: Not on file    Review of Systems  Constitutional: Negative.   HENT: Negative.   Eyes: Negative.   Respiratory: Negative.  Negative for cough and shortness of breath.   Cardiovascular: Negative for chest pain, palpitations and leg swelling.  Gastrointestinal: Negative.   Endocrine: Negative.   Genitourinary: Negative.   Musculoskeletal: Positive for back pain.  Skin: Negative.   Neurological: Positive for weakness.  Psychiatric/Behavioral: Negative.      Objective:  BP 138/80   Pulse 86   Temp 97.7 F (36.5 C)   Resp 16   Ht 5' (1.524 m)   Wt 169 lb 6.4 oz (76.8 kg)   SpO2 96%   BMI 33.08 kg/m   BP/Weight 01/12/2020 11/04/2019 10/14/2019  Systolic BP 138 150 130  Diastolic BP 80 86 98  Wt. (Lbs) 169.4 168.8 164.2  BMI 33.08 32.97 32.07    Physical Exam Vitals reviewed.  Constitutional:      Appearance: Normal appearance.  HENT:     Head: Normocephalic and atraumatic.     Right Ear: Tympanic membrane, ear canal and external ear normal.     Left Ear: Ear canal and external ear normal.     Nose: Nose normal.     Mouth/Throat:     Mouth: Mucous membranes are moist.     Pharynx: Oropharynx is clear.  Eyes:     Extraocular Movements: Extraocular movements intact.     Conjunctiva/sclera: Conjunctivae normal.     Pupils: Pupils are equal, round, and reactive to light.     Comments: glasses    Cardiovascular:     Rate and Rhythm: Normal rate and regular rhythm.     Pulses: Normal pulses.     Heart sounds: Normal heart sounds.  Pulmonary:     Effort: Pulmonary effort is normal.     Breath sounds: Normal breath sounds.  Abdominal:     General: Abdomen is flat. Bowel sounds are normal.     Palpations: Abdomen is soft.  Musculoskeletal:  General: Normal range of motion.     Cervical back: Normal range of motion and neck supple.  Skin:    General: Skin is warm and dry.     Capillary Refill: Capillary refill takes less than 2 seconds.  Neurological:     Mental Status: She is alert and oriented to person, place, and time. Mental status is at baseline.       Lab Results  Component Value Date   WBC 5.5 10/01/2019   HGB 13.1 10/01/2019   HCT 40.9 10/01/2019   PLT 323 10/01/2019   GLUCOSE 104 (H) 10/01/2019   CHOL 295 (H) 10/01/2019   TRIG 246 (H) 10/01/2019   HDL 38 (L) 10/01/2019   LDLCALC 208 (H) 10/01/2019   ALT 9 10/01/2019   AST 9 10/01/2019   NA 140 10/01/2019   K 4.3 10/01/2019   CL 105 10/01/2019   CREATININE 1.09 (H) 10/01/2019   BUN 19 10/01/2019   CO2 20 10/01/2019   TSH 2.310 10/01/2019   INR 1.22 07/31/2015   HGBA1C 5.6 08/01/2015      Assessment & Plan:   1. Essential hypertension - CBC with Differential/Platelet - Comprehensive metabolic panel An individual hypertension care plan was established and reinforced today.  The patient's status was assessed using clinical findings on exam and labs or diagnostic tests. The patient's success at meeting treatment goals on disease specific evidence-based guidelines and found to be well controlled. SELF MANAGEMENT: The patient and I together assessed ways to personally work towards obtaining the recommended goals. RECOMMENDATIONS: avoid decongestants found in common cold remedies, decrease consumption of alcohol, perform routine monitoring of BP with home BP cuff, exercise, reduction of dietary  salt, take medicines as prescribed, try not to miss doses and quit smoking.  Regular exercise and maintaining a healthy weight is needed.  Stress reduction may help. A CLINICAL SUMMARY including written plan identify barriers to care unique to individual due to social or financial issues.  We attempt to mutually creat solutions for individual and family understanding.  2. Stage 3a chronic kidney disease (HCC) AN INDIVIDUAL CARE PLAN for renal disease was established and reinforced today.  The patient's status was assessed using clinical findings on exam, labs, and other diagnostic testing. Patient's success at meeting treatment goals based on disease specific evidence-bassed guidelines and found to be in good control. RECOMMENDATIONS include maintaining present medicines and treatment.  3. Mixed hyperlipidemia - Lipid panel AN INDIVIDUAL CARE PLAN for hyperlipidemia/ cholesterol was established and reinforced today.  The patient's status was assessed using clinical findings on exam, lab and other diagnostic tests. The patient's disease status was assessed based on evidence-based guidelines and found to be well controlled. MEDICATIONS were reviewed. SELF MANAGEMENT GOALS have been discussed and patient's success at attaining the goal of low cholesterol was assessed. RECOMMENDATION given include regular exercise 3 days a week and low cholesterol/low fat diet. CLINICAL SUMMARY including written plan to identify barriers unique to the patient due to social or economic  reasons was discussed.  4. Fatigue, unspecified type - TSH - VITAMIN D 25 Hydroxy (Vit-D Deficiency, Fractures) Patient is having more fatigue, nee to check vitamin D level      Orders Placed This Encounter  Procedures  . CBC with Differential/Platelet  . Comprehensive metabolic panel  . Lipid panel  . TSH  . VITAMIN D 25 Hydroxy (Vit-D Deficiency, Fractures)     Follow-up: Return in 3 months (on 04/13/2020).  An After  Visit Summary was  printed and given to the patient.  [name] [facility] [phone]

## 2020-01-13 LAB — TSH: TSH: 2.18 u[IU]/mL (ref 0.450–4.500)

## 2020-01-13 LAB — COMPREHENSIVE METABOLIC PANEL
ALT: 7 IU/L (ref 0–32)
AST: 8 IU/L (ref 0–40)
Albumin/Globulin Ratio: 1.4 (ref 1.2–2.2)
Albumin: 4 g/dL (ref 3.7–4.7)
Alkaline Phosphatase: 133 IU/L — ABNORMAL HIGH (ref 44–121)
BUN/Creatinine Ratio: 15 (ref 12–28)
BUN: 15 mg/dL (ref 8–27)
Bilirubin Total: 0.3 mg/dL (ref 0.0–1.2)
CO2: 22 mmol/L (ref 20–29)
Calcium: 9.9 mg/dL (ref 8.7–10.3)
Chloride: 104 mmol/L (ref 96–106)
Creatinine, Ser: 1.02 mg/dL — ABNORMAL HIGH (ref 0.57–1.00)
GFR calc Af Amer: 60 mL/min/{1.73_m2} (ref >59)
GFR calc non Af Amer: 52 mL/min/{1.73_m2} — ABNORMAL LOW (ref >59)
Globulin, Total: 2.9 g/dL (ref 1.5–4.5)
Glucose: 102 mg/dL — ABNORMAL HIGH (ref 65–99)
Potassium: 4.1 mmol/L (ref 3.5–5.2)
Sodium: 141 mmol/L (ref 134–144)
Total Protein: 6.9 g/dL (ref 6.0–8.5)

## 2020-01-13 LAB — CARDIOVASCULAR RISK ASSESSMENT

## 2020-01-13 LAB — LIPID PANEL
Chol/HDL Ratio: 7.4 ratio — ABNORMAL HIGH (ref 0.0–4.4)
Cholesterol, Total: 296 mg/dL — ABNORMAL HIGH (ref 100–199)
HDL: 40 mg/dL (ref >39)
LDL Chol Calc (NIH): 213 mg/dL — ABNORMAL HIGH (ref 0–99)
Triglycerides: 217 mg/dL — ABNORMAL HIGH (ref 0–149)
VLDL Cholesterol Cal: 43 mg/dL — ABNORMAL HIGH (ref 5–40)

## 2020-01-13 LAB — CBC WITH DIFFERENTIAL/PLATELET
Basophils Absolute: 0 10*3/uL (ref 0.0–0.2)
Basos: 0 %
EOS (ABSOLUTE): 0.1 10*3/uL (ref 0.0–0.4)
Eos: 2 %
Hematocrit: 38.7 % (ref 34.0–46.6)
Hemoglobin: 13 g/dL (ref 11.1–15.9)
Immature Grans (Abs): 0 10*3/uL (ref 0.0–0.1)
Immature Granulocytes: 0 %
Lymphocytes Absolute: 1.2 10*3/uL (ref 0.7–3.1)
Lymphs: 17 %
MCH: 32.2 pg (ref 26.6–33.0)
MCHC: 33.6 g/dL (ref 31.5–35.7)
MCV: 96 fL (ref 79–97)
Monocytes Absolute: 0.7 10*3/uL (ref 0.1–0.9)
Monocytes: 10 %
Neutrophils Absolute: 5 10*3/uL (ref 1.4–7.0)
Neutrophils: 71 %
Platelets: 352 10*3/uL (ref 150–450)
RBC: 4.04 x10E6/uL (ref 3.77–5.28)
RDW: 12.5 % (ref 11.7–15.4)
WBC: 7.1 10*3/uL (ref 3.4–10.8)

## 2020-01-13 LAB — VITAMIN D 25 HYDROXY (VIT D DEFICIENCY, FRACTURES): Vit D, 25-Hydroxy: 8.1 ng/mL — ABNORMAL LOW (ref 30.0–100.0)

## 2020-01-13 NOTE — Progress Notes (Signed)
CBC normal, glucose 102, kidney tests remain stage 3, liver tests normal, triglycerides 217 high and LDL cholesterol 213- very high suggest cardiology consult to assist in lipid lowering with history of vascular disease lp

## 2020-01-14 ENCOUNTER — Ambulatory Visit: Payer: Medicare HMO | Admitting: Cardiovascular Disease

## 2020-01-14 ENCOUNTER — Encounter: Payer: Self-pay | Admitting: Cardiovascular Disease

## 2020-01-14 ENCOUNTER — Other Ambulatory Visit: Payer: Self-pay

## 2020-01-14 VITALS — BP 138/66 | HR 83 | Ht 61.0 in | Wt 169.8 lb

## 2020-01-14 DIAGNOSIS — R0989 Other specified symptoms and signs involving the circulatory and respiratory systems: Secondary | ICD-10-CM

## 2020-01-14 DIAGNOSIS — R011 Cardiac murmur, unspecified: Secondary | ICD-10-CM | POA: Diagnosis not present

## 2020-01-14 DIAGNOSIS — E785 Hyperlipidemia, unspecified: Secondary | ICD-10-CM

## 2020-01-14 DIAGNOSIS — I6522 Occlusion and stenosis of left carotid artery: Secondary | ICD-10-CM

## 2020-01-14 NOTE — Patient Instructions (Addendum)
Medication Instructions:  *If you need a refill on your cardiac medications before your next appointment, please call your pharmacy*  Lab Work: If you have labs (blood work) drawn today and your tests are completely normal, you will receive your results only by: [name] MyChart Message (if you have MyChart) OR . A paper copy in the mail If you have any lab test that is abnormal or we need to change your treatment, we will call you to review the results.  Testing/Procedures: Your physician has requested that you have an echocardiogram in March. Echocardiography is a painless test that uses sound waves to create images of your heart. It provides your doctor with information about the size and shape of your heart and how well your heart's chambers and valves are working. This procedure takes approximately one hour. There are no restrictions for this procedure.  Your physician has requested that you have a carotid duplex in March. This test is an ultrasound of the carotid arteries in your neck. It looks at blood flow through these arteries that supply the brain with blood. Allow one hour for this exam. There are no restrictions or special instructions.  Follow-Up: At [facility], you and your health needs are our priority.  As part of our continuing mission to provide you with exceptional heart care, we have created designated Provider Care Teams.  These Care Teams include your primary Cardiologist (physician) and Advanced Practice Providers (APPs -  Physician Assistants and Nurse Practitioners) who all work together to provide you with the care you need, when you need it.  We recommend signing up for the patient portal called "MyChart".  Sign up information is provided on this After Visit Summary.  MyChart is used to connect with patients for Virtual Visits (Telemedicine).  Patients are able to view lab/test results, encounter notes, upcoming appointments, etc.  Non-urgent messages can be sent to your  provider as well.   To learn more about what you can do with MyChart, go to [email or web address].    Your next appointment:   March after test are scheduled.  The format for your next appointment:   In Person  Provider:   You may see Dr. Eden Emms or one of the following Advanced Practice Providers on your designated Care Team:    Norma Fredrickson, NP  Nada Boozer, NP  Georgie Chard, NP   You have been referred to Lipid Clinic as soon as possible.

## 2020-01-25 ENCOUNTER — Ambulatory Visit (INDEPENDENT_AMBULATORY_CARE_PROVIDER_SITE_OTHER): Payer: Medicare HMO | Admitting: Pharmacist

## 2020-01-25 ENCOUNTER — Other Ambulatory Visit: Payer: Self-pay

## 2020-01-25 ENCOUNTER — Telehealth: Payer: Self-pay | Admitting: Pharmacist

## 2020-01-25 DIAGNOSIS — E782 Mixed hyperlipidemia: Secondary | ICD-10-CM

## 2020-01-25 NOTE — Progress Notes (Signed)
Patient ID: NITARA SZCZERBA                 DOB: [birth year]                    MRN: [id number]     HPI: [name] is a 79 y.o. female patient referred to lipid clinic by Dr. Eden Emms. PMH is significant for HTN, LBBB, HLD, CVA in march 2017. Echo with EF 60-65%. 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 Patient has a history of statin intolerance. Per discussion with Dr. Eden Emms, patient willing to try alternatives if not cost prohibitive.   Patient presents to clinic today accompanied by her daughter. She states she has tried multiple statins in the past. The worst one was Lipitor per her account. States her legs get achy, weak and hurt. Pain goes away when she stops the medication. Cost is $47/month for PCSK9i. Patient may qualify for patient assistance if she has spent more than $500 out of pocket for medications this year per household.  Current Medications: none Intolerances: rosuvastatin 5mg  daily, simvastatin 40mg  daily, atorvastatin (leg pains/weakness) Risk Factors: CVA, LDL >190, HTN LDL goal: <70  Diet: breakfast: instant pre-pack oatmeal  Lunch: nothing Dinner: banana sandwich, chicken pie, vegetables Snacks: 1/2 bag potatoe chips Drink: mt dew, pepsi, amazing orange  Exercise: none due to leg weakness, knee pain  Family History: The patient's family history includes Diabetes in her brother; Heart attack in her father; Hypertension in her brother, father, and mother.   Social History: The patient  reports that she has never smoked. She has never used smokeless tobacco. She reports that she does not drink alcohol and does not use drugs.  Labs: 01/12/20: TC 296, TG 217, HDL 40, LDL 213  Past Medical History:  Diagnosis Date   Benign paroxysmal vertigo, unspecified ear 06/07/2019   CKD (chronic kidney disease), stage III (HCC)    GERD (gastroesophageal reflux disease)    Glaucoma     Current Outpatient Medications on File  Prior to Visit  Medication Sig Dispense Refill   Acetaminophen (TYLENOL) 325 MG CAPS Take 650 mg by mouth every 6 (six) hours.     amLODipine (NORVASC) 10 MG tablet Take 1 tablet (10 mg total) by mouth daily. 90 tablet 2   aspirin 325 MG tablet Take 325 mg by mouth daily.     cetirizine (ZYRTEC) 10 MG tablet Take 10 mg by mouth at bedtime.     clopidogrel (PLAVIX) 75 MG tablet TAKE 1 TABLET BY MOUTH EVERY DAY 90 tablet 2   furosemide (LASIX) 20 MG tablet TAKE 1 TABLET BY MOUTH EVERY DAY AS NEEDED FOR FLUID 90 tablet 2   gabapentin (NEURONTIN) 100 MG capsule Take 1 capsule (100 mg total) by mouth 3 (three) times daily. 90 capsule 3   latanoprost (XALATAN) 0.005 % ophthalmic solution 1 drop at bedtime.     pantoprazole (PROTONIX) 40 MG tablet Take 1 tablet (40 mg total) by mouth 2 (two) times daily.     valsartan (DIOVAN) 320 MG tablet TAKE 1 TABLET BY MOUTH EVERY DAY 90 tablet 2   venlafaxine XR (EFFEXOR-XR) 37.5 MG 24 hr capsule TAKE 1 CAPSULE BY MOUTH DAILY WITH BREAKFAST. 90 capsule 2   No current facility-administered medications on file prior to visit.    Allergies  Allergen Reactions   Codeine Nausea And Vomiting    Nausea    Meclizine Other (See Comments)  Lethargy, extreme sleepiness   Requip [Ropinirole Hcl] Nausea And Vomiting   Statins     Muscle weakness    Assessment/Plan:  1. Hyperlipidemia - Patient LDL is above goal of <70. LDL is very elevated. We discussed the importance of statin therapy on cardiovascular risk reduction and the desire to have patient on baseline statin therapy. Patient does not desire to re-try statins at this time. Discussed PCSK9i. Reviewed injection technique, side effects and efficacy. Patient interested in trying. Will fill out paperwork for patient assistance for PASS. She was given an application. Daughter will drop off completed form with proof of income and out of pocket expenses and we will fax to PASS. I will submit prior  authorization to insurance as this will also be needed.  Diet and exercise were also discussed. Patient TG are elevated. She drinks a large amount of soda (2L per day per pt account). Discussed the negative effects of this much sugar and gave her a goal of decreasing soda intake by 1/2 by the end of Nov and another half by the end of Dec. Also recommended some seated exercises and exercising in the pool.  Will recheck lipids 2-3 months after starting PCSK9i. If LDL is not at goal (most likely will not), can consider adding zetia (using good rx coupon) or rosuvastatin 2-3 times per week.  Of note it was discovered after visit that patient has ASA 325mg  on her med list. Patient is >3 months out from her stroke and should only be on clopidogrel. I will call patient to discuss and see if she is even taking ASA or if its an error in the med list.  Thank you,   , Pharm.D, BCPS, CPP [facility]  [address], [place], [place]  Phone: [phone]; Fax: [phone]

## 2020-01-25 NOTE — Telephone Encounter (Signed)
PA for Praluent approved through 03/24/20. Will submit patient assistance paperwork once daughter returns it to the office.

## 2020-01-25 NOTE — Patient Instructions (Signed)
It was a pleasure to meet you!  We will submit a prior authorization for Praluent. Please fill out the patient assistance applications and return it with a print out from your pharmacy of your out of pocket expenses for 2021.  Please try limit your soda intake. The goal would be to cut your soda intake in half by the end of the November. Then cut in half again by the end of December.  Try making your own oatmeal using fruit, spices and nuts  Call us at [phone] with any questions or concerns.

## 2020-01-27 NOTE — Telephone Encounter (Signed)
Daughter dropped off paperwork needed for Praluent patient assistance. I asked her if patient has been taking ASA. She states she thinks she is. I advised that she was supposed to stop taking. Daughter states she will tell pt to stop taking.

## 2020-01-27 NOTE — Addendum Note (Signed)
Addended by: Malena Peer D on: 01/27/2020 05:47 PM   Modules accepted: Orders

## 2020-01-28 NOTE — Telephone Encounter (Signed)
Application for PASS faxed today 01/28/20

## 2020-02-02 NOTE — Telephone Encounter (Signed)
Patient approved for PASS. Called pt and left VM for her to call back to let her know she was approved and how to set up shipment.

## 2020-02-03 NOTE — Telephone Encounter (Signed)
Spoke with patient. PASS called her today to set up shipment. She has lasting labs in Feb with PCP. Will repeat lipid panel then.

## 2020-02-05 ENCOUNTER — Other Ambulatory Visit: Payer: Self-pay | Admitting: Legal Medicine

## 2020-02-05 DIAGNOSIS — I10 Essential (primary) hypertension: Secondary | ICD-10-CM

## 2020-04-07 ENCOUNTER — Telehealth: Payer: Self-pay | Admitting: Pharmacist

## 2020-04-07 MED ORDER — PRALUENT 75 MG/ML ~~LOC~~ SOAJ: 1.0000 "pen " | SUBCUTANEOUS | 3 refills | Status: DC

## 2020-04-07 NOTE — Telephone Encounter (Signed)
[facility] sent request to do PA for Praluent. PA sent

## 2020-04-07 NOTE — Telephone Encounter (Signed)
Patient called stating she needs to be re-enrolled in PASS. She wont be approved yet bc they have not spent 500 dollars out of pocket. Instead I signed her up for [employer] grant  Pharmacy Card Id [id number] Group [id number] PCN [id number] BIN [id number]  Info [facility]ed to [facility] and Rx sent. Pt advised not to pick up unless its free.

## 2020-04-10 ENCOUNTER — Other Ambulatory Visit: Payer: Self-pay | Admitting: Pharmacist

## 2020-04-10 MED ORDER — PRALUENT 75 MG/ML ~~LOC~~ SOAJ
1.0000 | SUBCUTANEOUS | 3 refills | Status: DC
Start: 2020-04-10 — End: 2020-12-13

## 2020-04-10 NOTE — Telephone Encounter (Signed)
Prior auth approved through 03/24/21, refill sent to pharmacy.

## 2020-04-11 NOTE — Telephone Encounter (Signed)
Patient called and left VM that her Praluent was $150. I called pharmacy gave them the grant info again, now $0. Pt made aware and was grateful for the help.

## 2020-04-26 ENCOUNTER — Other Ambulatory Visit: Payer: Self-pay | Admitting: Legal Medicine

## 2020-04-26 DIAGNOSIS — I10 Essential (primary) hypertension: Secondary | ICD-10-CM

## 2020-05-02 ENCOUNTER — Telehealth: Payer: Self-pay | Admitting: Legal Medicine

## 2020-05-02 NOTE — Chronic Care Management (AMB) (Signed)
  Chronic Care Management   Note  05/02/2020 Name: ARELI FRARY MRN: [id number] DOB: 1940/09/16  TARICA HARL is a 80 y.o. year old female who is a primary care patient of [name], MD. I reached out to Harlene Ramus by phone today in response to a referral sent by Ms. Delfin Gant Winker's PCP, [name], MD.   Ms. Mcgriff was given information about Chronic Care Management services today including:  1. CCM service includes personalized support from designated clinical staff supervised by her physician, including individualized plan of care and coordination with other care providers 2. 24/7 contact phone numbers for assistance for urgent and routine care needs. 3. Service will only be billed when office clinical staff spend 20 minutes or more in a month to coordinate care. 4. Only one practitioner may furnish and bill the service in a calendar month. 5. The patient may stop CCM services at any time (effective at the end of the month) by phone call to the office staff.   Patient agreed to services and verbal consent obtained.   Follow up plan:   Aggie Hacker  Upstream Scheduler

## 2020-05-15 ENCOUNTER — Encounter: Payer: Self-pay | Admitting: Legal Medicine

## 2020-05-15 ENCOUNTER — Other Ambulatory Visit: Payer: Self-pay

## 2020-05-15 ENCOUNTER — Ambulatory Visit (INDEPENDENT_AMBULATORY_CARE_PROVIDER_SITE_OTHER): Payer: Medicare HMO | Admitting: Legal Medicine

## 2020-05-15 VITALS — BP 158/80 | HR 58 | Temp 98.3°F | Resp 16 | Ht 60.0 in | Wt 166.0 lb

## 2020-05-15 DIAGNOSIS — R69 Illness, unspecified: Secondary | ICD-10-CM | POA: Diagnosis not present

## 2020-05-15 DIAGNOSIS — E559 Vitamin D deficiency, unspecified: Secondary | ICD-10-CM

## 2020-05-15 DIAGNOSIS — I10 Essential (primary) hypertension: Secondary | ICD-10-CM | POA: Diagnosis not present

## 2020-05-15 DIAGNOSIS — I6930 Unspecified sequelae of cerebral infarction: Secondary | ICD-10-CM | POA: Insufficient documentation

## 2020-05-15 DIAGNOSIS — F322 Major depressive disorder, single episode, severe without psychotic features: Secondary | ICD-10-CM

## 2020-05-15 DIAGNOSIS — F5101 Primary insomnia: Secondary | ICD-10-CM

## 2020-05-15 DIAGNOSIS — R5383 Other fatigue: Secondary | ICD-10-CM

## 2020-05-15 DIAGNOSIS — I739 Peripheral vascular disease, unspecified: Secondary | ICD-10-CM

## 2020-05-15 DIAGNOSIS — E782 Mixed hyperlipidemia: Secondary | ICD-10-CM | POA: Diagnosis not present

## 2020-05-15 DIAGNOSIS — N1831 Chronic kidney disease, stage 3a: Secondary | ICD-10-CM | POA: Diagnosis not present

## 2020-05-15 NOTE — Progress Notes (Signed)
Subjective:  Patient ID: [name], female    DOB: [birth year]  Age: 80 y.o. MRN: [id number]  Chief Complaint  Patient presents with  . Hypertension  . Hyperlipidemia    HPI: chronic visit  Patient presents for follow up of hypertension.  Patient tolerating valsartan well with side effects.  Patient was diagnosed with hypertension 2010 so has been treated for hypertension for 10 years.Patient is working on maintaining diet and exercise regimen and follows up as directed. Complication include none.  Patient presents with hyperlipidemia.  Compliance with treatment has been good; patient takes medicines as directed, maintains low cholesterol diet, follows up as directed, and maintains exercise regimen.  Patient is using none without problems.  Tired all the time, check thyroid and sleep study  Cerebellar stroke with tsill gait disturbance and some falls.she is using a cane.   Current Outpatient Medications on File Prior to Visit  Medication Sig Dispense Refill  . Acetaminophen (TYLENOL) 325 MG CAPS Take 650 mg by mouth every 6 (six) hours.    . Alirocumab (PRALUENT) 75 MG/ML SOAJ Inject 1 pen into the skin every 14 (fourteen) days. 6 mL 3  . amLODipine (NORVASC) 10 MG tablet TAKE 1 TABLET BY MOUTH EVERY DAY 90 tablet 2  . clopidogrel (PLAVIX) 75 MG tablet TAKE 1 TABLET BY MOUTH EVERY DAY 90 tablet 2  . furosemide (LASIX) 20 MG tablet TAKE 1 TABLET BY MOUTH EVERY DAY AS NEEDED FOR FLUID 90 tablet 2  . gabapentin (NEURONTIN) 100 MG capsule Take 1 capsule (100 mg total) by mouth 3 (three) times daily. 90 capsule 3  . latanoprost (XALATAN) 0.005 % ophthalmic solution 1 drop at bedtime.    [name] loratadine (CLARITIN) 10 MG tablet Take 10 mg by mouth daily.    . pantoprazole (PROTONIX) 40 MG tablet TAKE 1 TABLET BY MOUTH TWICE A DAY 180 tablet 2  . sertraline (ZOLOFT) 25 MG tablet TAKE 1 TABLET BY MOUTH EVERY DAY 90 tablet 2  . valsartan (DIOVAN) 320 MG tablet TAKE 1 TABLET BY MOUTH EVERY DAY  90 tablet 2  . venlafaxine XR (EFFEXOR-XR) 37.5 MG 24 hr capsule TAKE 1 CAPSULE BY MOUTH DAILY WITH BREAKFAST. 90 capsule 2   No current facility-administered medications on file prior to visit.   Past Medical History:  Diagnosis Date  . Benign paroxysmal vertigo, unspecified ear 06/07/2019  . CKD (chronic kidney disease), stage III (HCC)   . GERD (gastroesophageal reflux disease)   . Glaucoma    Past Surgical History:  Procedure Laterality Date  . ABDOMINAL HYSTERECTOMY    . BACK SURGERY    . cataract surgery    . COLONOSCOPY N/A 08/03/2015   Procedure: COLONOSCOPY;  Surgeon: Carman Ching, MD;  Location: [facility] ENDOSCOPY;  Service: Endoscopy;  Laterality: N/A;  . ESOPHAGOGASTRODUODENOSCOPY N/A 07/31/2015   Procedure: ESOPHAGOGASTRODUODENOSCOPY (EGD);  Surgeon: Carman Ching, MD;  Location: [facility] ENDOSCOPY;  Service: Endoscopy;  Laterality: N/A;  . OOPHORECTOMY    . TOTAL HIP ARTHROPLASTY    . URETER SURGERY      Family History  Problem Relation Age of Onset  . Hypertension Mother   . Hypertension Father   . Heart attack Father   . Hypertension Brother   . Diabetes Brother    Social History   Socioeconomic History  . Marital status: Married    Spouse name: Not on file  . Number of children: Not on file  . Years of education: Not on file  . Highest education  level: Not on file  Occupational History  . Not on file  Tobacco Use  . Smoking status: Never Smoker  . Smokeless tobacco: Never Used  Vaping Use  . Vaping Use: Never used  Substance and Sexual Activity  . Alcohol use: No  . Drug use: No  . Sexual activity: Not Currently  Other Topics Concern  . Not on file  Social History Narrative  . Not on file   Social Determinants of Health   Financial Resource Strain: Not on file  Food Insecurity: Not on file  Transportation Needs: Not on file  Physical Activity: Not on file  Stress: Not on file  Social Connections: Not on file    Review of Systems  Constitutional:  Positive for fatigue. Negative for activity change and appetite change.  HENT: Negative for congestion and ear pain.   Eyes: Negative for visual disturbance.  Respiratory: Negative for cough, chest tightness and shortness of breath.   Cardiovascular: Negative for chest pain, palpitations and leg swelling.  Gastrointestinal: Negative for abdominal distention and abdominal pain.  Endocrine: Positive for polyuria.  Genitourinary: Negative for difficulty urinating, enuresis and frequency.  Musculoskeletal: Negative for arthralgias and back pain.  Neurological:       Gait disturbance  Psychiatric/Behavioral: Negative.      Objective:  BP (!) 158/80   Pulse (!) 58   Temp 98.3 F (36.8 C)   Resp 16   Ht 5' (1.524 m)   Wt 166 lb (75.3 kg)   SpO2 95%   BMI 32.42 kg/m   BP/Weight 05/15/2020 01/14/2020 01/12/2020  Systolic BP 158 138 138  Diastolic BP 80 66 80  Wt. (Lbs) 166 169.8 169.4  BMI 32.42 32.08 33.08    Physical Exam Vitals reviewed.  Constitutional:      Appearance: Normal appearance.  HENT:     Head: Normocephalic.     Right Ear: Tympanic membrane, ear canal and external ear normal.     Left Ear: Tympanic membrane, ear canal and external ear normal.     Mouth/Throat:     Mouth: Mucous membranes are moist.     Pharynx: Oropharynx is clear.  Eyes:     Extraocular Movements: Extraocular movements intact.     Conjunctiva/sclera: Conjunctivae normal.     Pupils: Pupils are equal, round, and reactive to light.  Cardiovascular:     Rate and Rhythm: Normal rate and regular rhythm.     Pulses: Normal pulses.     Heart sounds: No murmur heard. No gallop.   Pulmonary:     Effort: Pulmonary effort is normal. No respiratory distress.     Breath sounds: Normal breath sounds. No rales.  Abdominal:     General: Abdomen is flat. Bowel sounds are normal. There is no distension.     Palpations: Abdomen is soft.     Tenderness: There is no abdominal tenderness.   Musculoskeletal:        General: Normal range of motion.  Skin:    General: Skin is warm and dry.     Capillary Refill: Capillary refill takes less than 2 seconds.  Neurological:     General: No focal deficit present.     Mental Status: She is alert and oriented to person, place, and time.     Gait: Gait abnormal.     Comments: Positive rhomberg  Psychiatric:        Mood and Affect: Mood normal.        Thought Content: Thought content  normal.        Judgment: Judgment normal.    Depression screen [facility] 2/9 05/15/2020 01/12/2020 10/14/2019 10/01/2019 06/02/2019  Decreased Interest 1 0 1 3 2   Down, Depressed, Hopeless 1 0 0 3 1  PHQ - 2 Score 2 0 1 6 3   Altered sleeping 3 0 3 3 3   Tired, decreased energy 3 0 3 3 3   Change in appetite 2 0 3 3 2   Feeling bad or failure about yourself  0 0 0 3 2  Trouble concentrating 0 0 0 2 3  Moving slowly or fidgety/restless 1 0 0 0 2  Suicidal thoughts 0 0 0 0 0  PHQ-9 Score 11 0 10 20 18   Difficult doing work/chores Somewhat difficult - Not difficult at all Very difficult Somewhat difficult      Lab Results  Component Value Date   WBC 7.1 01/12/2020   HGB 13.0 01/12/2020   HCT 38.7 01/12/2020   PLT 352 01/12/2020   GLUCOSE 102 (H) 01/12/2020   CHOL 296 (H) 01/12/2020   TRIG 217 (H) 01/12/2020   HDL 40 01/12/2020   LDLCALC 213 (H) 01/12/2020   ALT 7 01/12/2020   AST 8 01/12/2020   NA 141 01/12/2020   K 4.1 01/12/2020   CL 104 01/12/2020   CREATININE 1.02 (H) 01/12/2020   BUN 15 01/12/2020   CO2 22 01/12/2020   TSH 2.180 01/12/2020   INR 1.22 07/31/2015   HGBA1C 5.6 08/01/2015      Assessment & Plan:   1. Essential hypertension - Comprehensive metabolic panel - CBC with Differential/Platelet - AMB Referral to [facility] An individual hypertension care plan was established and reinforced today.  The patient's status was assessed using clinical findings on exam and labs or diagnostic tests. The patient's  success at meeting treatment goals on disease specific evidence-based guidelines and found to be well controlled. SELF MANAGEMENT: The patient and I together assessed ways to personally work towards obtaining the recommended goals. RECOMMENDATIONS: avoid decongestants found in common cold remedies, decrease consumption of alcohol, perform routine monitoring of BP with home BP cuff, exercise, reduction of dietary salt, take medicines as prescribed, try not to miss doses and quit smoking.  Regular exercise and maintaining a healthy weight is needed.  Stress reduction may help. A CLINICAL SUMMARY including written plan identify barriers to care unique to individual due to social or financial issues.  We attempt to mutually creat solutions for individual and family understanding.  2. Mixed hyperlipidemia - Lipid panel - TSH - [facility]B [facility] AN INDIVIDUAL CARE PLAN for hyperlipidemia/ cholesterol was established and reinforced today.  The patient's status was assessed using clinical findings on exam, lab and other diagnostic tests. The patient's disease status was assessed based on evidence-based guidelines and found to be well controlled. MEDICATIONS were reviewed. SELF MANAGEMENT GOALS have been discussed and patient's success at attaining the goal of low cholesterol was assessed. RECOMMENDATION given include regular exercise 3 days a week and low cholesterol/low fat diet. CLINICAL SUMMARY including written plan to identify barriers unique to the patient due to social or economic  reasons was discussed.  3. Depression, major, single episode, severe (HCC) Patient's depression is controlled with zoloft.   Anhedonia better.  PHQ 9 was performed score 11. An individual care plan was established or reinforced today.  The patient's disease status was assessed using clinical findings on exam, labs, and or other diagnostic testing to determine patient's success  in meeting treatment  goals based on disease specific evidence-based guidelines and found to be improved Recommendations include stay on medicine  4. Primary insomnia Patient is having less insomnia as depression improves.  5. PVD (peripheral vascular disease) (HCC) - AMB Referral to [facility] Patient has leg cramping at times but it is less  6. Stage 3a chronic kidney disease (HCC) - AMB Referral to [facility] Patient has stable CRF and controlling BP.   7. Vitamin D deficiency - VITAMIN D 25 Hydroxy (Vit-D Deficiency, Fractures) Patient is on Vitamin d supplementation, check evel      Orders Placed This Encounter  Procedures  . Comprehensive metabolic panel  . Lipid panel  . TSH  . CBC with Differential/Platelet  . VITAMIN D 25 Hydroxy (Vit-D Deficiency, Fractures)  . AMB Referral to [facility]  . Home sleep test      I spent 30 minutes dedicated to the care of this patient on the date of this encounter to include face-to-face time with the patient, as well as: records reviewed  Follow-up: Return in about 6 months (around 11/12/2020) for fasting.  An After Visit Summary was printed and given to the patient.  [name], MD [facility] [phone]

## 2020-05-16 ENCOUNTER — Other Ambulatory Visit: Payer: Self-pay | Admitting: Legal Medicine

## 2020-05-16 DIAGNOSIS — E559 Vitamin D deficiency, unspecified: Secondary | ICD-10-CM

## 2020-05-16 LAB — COMPREHENSIVE METABOLIC PANEL
ALT: 9 IU/L (ref 0–32)
AST: 14 IU/L (ref 0–40)
Albumin/Globulin Ratio: 1.3 (ref 1.2–2.2)
Albumin: 4 g/dL (ref 3.7–4.7)
Alkaline Phosphatase: 143 IU/L — ABNORMAL HIGH (ref 44–121)
BUN/Creatinine Ratio: 14 (ref 12–28)
BUN: 15 mg/dL (ref 8–27)
Bilirubin Total: 0.3 mg/dL (ref 0.0–1.2)
CO2: 20 mmol/L (ref 20–29)
Calcium: 9.7 mg/dL (ref 8.7–10.3)
Chloride: 105 mmol/L (ref 96–106)
Creatinine, Ser: 1.04 mg/dL — ABNORMAL HIGH (ref 0.57–1.00)
GFR calc Af Amer: 59 mL/min/{1.73_m2} — ABNORMAL LOW (ref >59)
GFR calc non Af Amer: 51 mL/min/{1.73_m2} — ABNORMAL LOW (ref >59)
Globulin, Total: 3 g/dL (ref 1.5–4.5)
Glucose: 104 mg/dL — ABNORMAL HIGH (ref 65–99)
Potassium: 4.1 mmol/L (ref 3.5–5.2)
Sodium: 140 mmol/L (ref 134–144)
Total Protein: 7 g/dL (ref 6.0–8.5)

## 2020-05-16 LAB — CBC WITH DIFFERENTIAL/PLATELET
Basophils Absolute: 0 10*3/uL (ref 0.0–0.2)
Basos: 0 %
EOS (ABSOLUTE): 0.1 10*3/uL (ref 0.0–0.4)
Eos: 2 %
Hematocrit: 38.3 % (ref 34.0–46.6)
Hemoglobin: 12.6 g/dL (ref 11.1–15.9)
Immature Grans (Abs): 0 10*3/uL (ref 0.0–0.1)
Immature Granulocytes: 0 %
Lymphocytes Absolute: 1.2 10*3/uL (ref 0.7–3.1)
Lymphs: 18 %
MCH: 30.8 pg (ref 26.6–33.0)
MCHC: 32.9 g/dL (ref 31.5–35.7)
MCV: 94 fL (ref 79–97)
Monocytes Absolute: 0.7 10*3/uL (ref 0.1–0.9)
Monocytes: 11 %
Neutrophils Absolute: 4.4 10*3/uL (ref 1.4–7.0)
Neutrophils: 69 %
Platelets: 359 10*3/uL (ref 150–450)
RBC: 4.09 x10E6/uL (ref 3.77–5.28)
RDW: 12.5 % (ref 11.7–15.4)
WBC: 6.4 10*3/uL (ref 3.4–10.8)

## 2020-05-16 LAB — CARDIOVASCULAR RISK ASSESSMENT

## 2020-05-16 LAB — LIPID PANEL
Chol/HDL Ratio: 4.3 ratio (ref 0.0–4.4)
Cholesterol, Total: 177 mg/dL (ref 100–199)
HDL: 41 mg/dL (ref >39)
LDL Chol Calc (NIH): 105 mg/dL — ABNORMAL HIGH (ref 0–99)
Triglycerides: 180 mg/dL — ABNORMAL HIGH (ref 0–149)
VLDL Cholesterol Cal: 31 mg/dL (ref 5–40)

## 2020-05-16 LAB — TSH: TSH: 2.55 u[IU]/mL (ref 0.450–4.500)

## 2020-05-16 LAB — VITAMIN D 25 HYDROXY (VIT D DEFICIENCY, FRACTURES): Vit D, 25-Hydroxy: 5.3 ng/mL — ABNORMAL LOW (ref 30.0–100.0)

## 2020-05-16 MED ORDER — CHOLECALCIFEROL 1.25 MG (50000 UT) PO CAPS
50000.0000 [IU] | ORAL_CAPSULE | ORAL | 3 refills | Status: DC
Start: 2020-05-16 — End: 2021-04-12

## 2020-05-16 NOTE — Progress Notes (Signed)
Glucose 104, kidney tests stable stage 3a, liver tests normal, triglycerides high 180 and LDL cholesterol high at 105, TSH 2.55 normal, cbc normal, vitamin d low 5.3, I will call in Rx.

## 2020-05-17 ENCOUNTER — Telehealth: Payer: Self-pay | Admitting: Pharmacist

## 2020-05-17 NOTE — Telephone Encounter (Signed)
LDL not at goal, but significantly improved. 213>105. TG improved but still high.  Called and spoke with patient about her labs. She is very pleased with the drop in LDL. Discussed adding zetia or rosuvastatin 5mg  twice a week. Patient did not wish to make any changes at this time. We discussed trying to decrease soda and sweets intake.

## 2020-05-24 ENCOUNTER — Ambulatory Visit (HOSPITAL_COMMUNITY): Payer: Medicare HMO

## 2020-05-24 ENCOUNTER — Other Ambulatory Visit (HOSPITAL_COMMUNITY): Payer: Medicare HMO

## 2020-06-07 NOTE — Progress Notes (Signed)
Chronic Care Management Pharmacy Note  06/13/2020 Name:  [name] MRN:  [id number] DOB:  [birth year]   Plan Recommendations:   Patient reports stopping sertraline and taking venlafaxine xr 37.5 mg capsule daily. She doesn't feel that depression is ideally controlled. Dr. Henrene Pastor approved increasing to 75 mg daily if patient would like.   Patient declined Dexa scan at this time due to age.   Subjective: [name] is an 80 y.o. year old female who is a primary patient of Henrene Pastor, Zeb Comfort, MD.  The CCM team was consulted for assistance with disease management and care coordination needs.    Engaged with patient by telephone for initial visit in response to provider referral for pharmacy case management and/or care coordination services.   Consent to Services:  The patient was given the following information about Chronic Care Management services today, agreed to services, and gave verbal consent: 1. CCM service includes personalized support from designated clinical staff supervised by the primary care provider, including individualized plan of care and coordination with other care providers 2. 24/7 contact phone numbers for assistance for urgent and routine care needs. 3. Service will only be billed when office clinical staff spend 20 minutes or more in a month to coordinate care. 4. Only one practitioner may furnish and bill the service in a calendar month. 5.The patient may stop CCM services at any time (effective at the end of the month) by phone call to the office staff. 6. The patient will be responsible for cost sharing (co-pay) of up to 20% of the service fee (after annual deductible is met). Patient agreed to services and consent obtained.  Patient Care Team: Lillard Anes, MD as PCP - General (Family Medicine) Burnice Logan, [facility] as Pharmacist (Pharmacist)  Recent office visits: 05/15/2020 - Glucose 104, kidney tests stable stage 3a, liver tests normal,  triglycerides high 180 and LDL cholesterol high at 105, TSH 2.55 normal, cbc normal, vitamin d low 5.3, I will call in Rx. 01/12/2020 - CBC normal, glucose 102, kidney tests remain stage 3, liver tests normal, triglycerides 217 high and LDL cholesterol 213- very high suggest cardiology consult to assist in lipid lowering with history of vascular disease  Recent consult visits: 01/25/2020 - cardiology pharmacist - patient assistance for pass. Can consider adding zetia or rosuvastatin to Praluent. Patient should only be on clopidogrel >3 months out of stroke not aspirin.  01/14/2020 - cardiology - f/u with neurology. Intolerant to statins. Refer to lipid clinic to start PSK9 if not cost prohibitive.   Hospital visits: None in previous 6 months  Objective:  Lab Results  Component Value Date   CREATININE 1.04 (H) 05/15/2020   BUN 15 05/15/2020   GFRNONAA 51 (L) 05/15/2020   GFRAA 59 (L) 05/15/2020   NA 140 05/15/2020   K 4.1 05/15/2020   CALCIUM 9.7 05/15/2020   CO2 20 05/15/2020    Lab Results  Component Value Date/Time   HGBA1C 5.6 08/01/2015 02:43 AM   HGBA1C 5.8 (H) 06/09/2015 11:59 PM    Last diabetic Eye exam: No results found for: HMDIABEYEEXA  Last diabetic Foot exam: No results found for: HMDIABFOOTEX   Lab Results  Component Value Date   CHOL 177 05/15/2020   HDL 41 05/15/2020   LDLCALC 105 (H) 05/15/2020   TRIG 180 (H) 05/15/2020   CHOLHDL 4.3 05/15/2020    Hepatic Function Latest Ref Rng & Units 05/15/2020 01/12/2020 10/01/2019  Total Protein 6.0 - 8.5 g/dL  7.0 6.9 6.7  Albumin 3.7 - 4.7 g/dL 4.0 4.0 4.2  AST 0 - 40 IU/L 14 8 9   ALT 0 - 32 IU/L 9 7 9   Alk Phosphatase 44 - 121 IU/L 143(H) 133(H) 124(H)  Total Bilirubin 0.0 - 1.2 mg/dL 0.3 0.3 0.3  Bilirubin, Direct 0.1 - 0.5 mg/dL - - -    Lab Results  Component Value Date/Time   TSH 2.550 05/15/2020 10:31 AM   TSH 2.180 01/12/2020 10:34 AM    CBC Latest Ref Rng & Units 05/15/2020 01/12/2020 10/01/2019  WBC  3.4 - 10.8 x10E3/uL 6.4 7.1 5.5  Hemoglobin 11.1 - 15.9 g/dL 12.6 13.0 13.1  Hematocrit 34.0 - 46.6 % 38.3 38.7 40.9  Platelets 150 - 450 x10E3/uL 359 352 323    Lab Results  Component Value Date/Time   VD25OH 5.3 (L) 05/15/2020 10:31 AM   VD25OH 8.1 (L) 01/12/2020 10:34 AM    Clinical ASCVD: Yes  The ASCVD Risk score Mikey Bussing DC Jr., et al., 2013) failed to calculate for the following reasons:   The patient has a prior MI or stroke diagnosis    Depression screen [facility] 2/9 06/13/2020 05/15/2020 01/12/2020  Decreased Interest 2 1 0  Down, Depressed, Hopeless 2 1 0  PHQ - 2 Score 4 2 0  Altered sleeping 0 3 0  Tired, decreased energy 3 3 0  Change in appetite 0 2 0  Feeling bad or failure about yourself  0 0 0  Trouble concentrating 0 0 0  Moving slowly or fidgety/restless 0 1 0  Suicidal thoughts 0 0 0  PHQ-9 Score 7 11 0  Difficult doing work/chores Somewhat difficult Somewhat difficult -     Social History   Tobacco Use  Smoking Status Never Smoker  Smokeless Tobacco Never Used   BP Readings from Last 3 Encounters:  05/15/20 (!) 158/80  01/14/20 138/66  01/12/20 138/80   Pulse Readings from Last 3 Encounters:  05/15/20 (!) 58  01/14/20 83  01/12/20 86   Wt Readings from Last 3 Encounters:  05/15/20 166 lb (75.3 kg)  01/14/20 169 lb 12.8 oz (77 kg)  01/12/20 169 lb 6.4 oz (76.8 kg)    Assessment/Interventions: Review of patient past medical history, allergies, medications, health status, including review of consultants reports, laboratory and other test data, was performed as part of comprehensive evaluation and provision of chronic care management services.   SDOH:  (Social Determinants of Health) assessments and interventions performed: Yes SDOH Interventions   Flowsheet Row Most Recent Value  SDOH Interventions   Depression Interventions/Treatment  Medication      CCM Care Plan  Allergies  Allergen Reactions  . Codeine Nausea And Vomiting    Nausea    . Meclizine Other (See Comments)    Lethargy, extreme sleepiness  . Requip [Ropinirole Hcl] Nausea And Vomiting  . Statins     Muscle weakness    Medications Reviewed Today    Reviewed by Burnice Logan, [facility] (Pharmacist) on 06/13/20 at [phone]  Med List Status: <None>  Medication Order Taking? Sig Documenting Provider Last Dose Status Informant  Acetaminophen (TYLENOL) 325 MG CAPS [id number] Yes Take 650 mg by mouth every 6 (six) hours as needed. [provider] Taking Active   Alirocumab (PRALUENT) 75 MG/ML SOAJ [id number] Yes Inject 1 pen into the skin every 14 (fourteen) days. Evans Lance, MD Taking Active   amLODipine (NORVASC) 10 MG tablet [id number] Yes TAKE 1 TABLET BY MOUTH EVERY DAY Henrene Pastor,  Zeb Comfort, MD Taking Active   Cholecalciferol 1.25 MG (50000 UT) capsule [id number] Yes Take 1 capsule (50,000 Units total) by mouth once a week. Lillard Anes, MD Taking Active   clopidogrel (PLAVIX) 75 MG tablet [id number] Yes TAKE 1 TABLET BY MOUTH EVERY DAY Lillard Anes, MD Taking Active   furosemide (LASIX) 20 MG tablet [id number] Yes TAKE 1 TABLET BY MOUTH EVERY DAY AS NEEDED FOR FLUID  Patient taking differently: Take 20 mg by mouth daily. Taking every day for swelling   Lillard Anes, MD Taking Active   gabapentin (NEURONTIN) 100 MG capsule [id number] No Take 1 capsule (100 mg total) by mouth 3 (three) times daily.  Patient not taking: Reported on 06/13/2020   Lillard Anes, MD Not Taking Consider Medication Status and Discontinue   latanoprost (XALATAN) 0.005 % ophthalmic solution [id number] Yes 1 drop at bedtime. [provider] Taking Active   loratadine (CLARITIN) 10 MG tablet [id number] Yes Take 10 mg by mouth daily. [provider] Taking Active   pantoprazole (PROTONIX) 40 MG tablet [id number] Yes TAKE 1 TABLET BY MOUTH TWICE A DAY  Patient taking differently: Take 40 mg by mouth daily. Patient only takes daily   Lillard Anes, MD Taking Active         Discontinued 06/13/20 [phone] (Change in therapy)   valsartan (DIOVAN) 320 MG tablet [id number] Yes TAKE 1 TABLET BY MOUTH EVERY DAY  Patient taking differently: Take 320 mg by mouth in the morning.   Lillard Anes, MD Taking Active   venlafaxine XR (EFFEXOR-XR) 37.5 MG 24 hr capsule [id number] Yes TAKE 1 CAPSULE BY MOUTH DAILY WITH BREAKFAST. Lillard Anes, MD Taking Active           Patient Active Problem List   Diagnosis Date Noted  . History of stroke with residual effects 05/15/2020  . Heat exhaustion 11/04/2019  . Depression, major, single episode, severe ([place]) 10/14/2019  . BMI 32.0-32.9,adult 10/01/2019  . PVD (peripheral vascular disease) ([place]) 10/01/2019  . Benign paroxysmal vertigo, unspecified ear 06/07/2019  . Primary insomnia 05/25/2019  . 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 Hiatal hernia   . Essential hypertension   . CKD (chronic kidney disease), stage III ([place])   . Upper GI bleed   . Acute embolic stroke ([place])   . Stroke (cerebrum) ([place]) 06/10/2015  . Mixed hyperlipidemia 02/13/2015  . Mild aortic stenosis 08/13/2013  . LBBB (left bundle branch block) 07/12/2011  . Carotid bruit 07/12/2011  . Chest pain 07/12/2011  . Murmur 07/12/2011    Immunization History  Administered Date(s) Administered  . PFIZER(Purple Top)SARS-COV-2 Vaccination 06/10/2019, 07/07/2019    Conditions to be addressed/monitored:  Hypertension, Hyperlipidemia, Chronic Kidney Disease, Depression and insomnia, PVD.   Care Plan : [place]  Updates made by Burnice Logan, RPH since 06/13/2020 12:00 AM    Problem: pvd, htn, hld, depression   Priority: High  Onset Date: 06/13/2020    Long-Range Goal: Disease Management   Start Date: 06/13/2020  Expected End Date: 06/13/2021  This Visit's Progress: On track  Priority: High  Note:      Current Barriers:  . Unable to independently afford treatment  regimen . Unable to achieve control of depression   Pharmacist Clinical Goal(s):  [name] Patient will verbalize ability to afford treatment regimen . achieve control of depression  as evidenced by improved PHQ-9 through collaboration with PharmD and provider.   Interventions: . 1:1  collaboration with Lillard Anes, MD regarding development and update of comprehensive plan of care as evidenced by provider attestation and co-signature . Inter-disciplinary care team collaboration (see longitudinal plan of care) . Comprehensive medication review performed; medication list updated in electronic medical record  Hypertension (BP goal <130/80) -Controlled -Current treatment: . amlodipine 10 mg daily  . Valsartan 325 mg daily  -Medications previously tried: none reported  -Current home readings:  not checking  -Current dietary habits:  limits sweets. Eats frozen dinners. Doesn't follow a heart healthy diet.  -Current exercise habits:  walks around yard and house. Has a bad knee that limits.  -Denies hypotensive/hypertensive symptoms -Educated on BP goals and benefits of medications for prevention of heart attack, stroke and kidney damage; Daily salt intake goal < 2300 mg; Exercise goal of 150 minutes per week; -Counseled to monitor BP at home as needed, document, and provide log at future appointments -Counseled on diet and exercise extensively Recommended to continue current medication  Hyperlipidemia/PVD: (LDL goal < 55) -Not ideally controlled -Current treatment: . Praluent 75 mg/ml injection every 14 days . Clopidogrel 75 mg daily  -Medications previously tried: statins -Current dietary patterns:  cooks at home. Eats some frozen dinners. Banana sandwich, Cheese sandwich. Daughter brings leftovers for meals. Trying to watch sweets. Drinking water with [employer] package.  -Current exercise habits: walks around yard and house. Has a bad knee.  -Educated on Cholesterol goals;   Importance of limiting foods high in cholesterol; Exercise goal of 150 minutes per week; -Counseled on diet and exercise extensively Recommended to continue current medication. Pharmacist consulted with [facility] to confirm refill of Praluent. Patient aware.   GERD (Goal: manage symptoms and avoid triggers) -Controlled -Current treatment  . Pantoprazole 40 mg daily -Medications previously tried: none reported  -Counseled on diet and exercise extensively Recommended to continue current medication Educated on avoiding triggers, eating smaller meals, elevating head during rest/sleep.   Depression/Anxiety (Goal: improve depression/PHQ9) -Not ideally controlled -Current treatment: . venlafaxine xr 37.5 mg daily  -Medications previously tried/failed: sertraline  -PHQ9: 7 -Educated on Benefits of medication for symptom control Benefits of cognitive-behavioral therapy with or without medication -Counseled on exercise as a way to improved depression. Discussed gratitude journaling. Patient works to focus on what she is thankful for.  Collaborated with Dr. Henrene Pastor to discuss control of depression. Dr. Henrene Pastor approved increasing to Venlafaxine XR 75 mg daily if patient would like.   Osteoporosis / Osteopenia (Goal reduce risk of fracture) -Not ideally controlled - unable to assess since patient declines updated Dexa -Last DEXA Scan:  none in record - last date indicated 08/08/2005.    -Current treatment  . Vitamin D 50,000 weekly  -Medications previously tried: none reported  -Recommend [phone] units of vitamin D daily. Recommend 1200 mg of calcium daily from dietary and supplemental sources. Recommend weight-bearing and muscle strengthening exercises for building and maintaining bone density. -Counseled on diet and exercise extensively Recommended consider Dexa scan update. Patient declines due to age. Discussed eating a diet rich in calcium through milk, cheese and dark, leafy green vegetables.      Health Maintenance -Vaccine gaps:  booster dose of COVID recommended.  -Current therapy:  . Furosemide 20 mg daily prn fluid - taking every day due to swelling . Loratadine 10 mg daily allergies  -Educated on Visual merchandiser third dose. Patient prefers to schedule with her local pharmacy due to proximity to the office.  -Patient is satisfied with current therapy and denies issues -  Counseled on diet and exercise extensively Recommended to continue current medication Educated on benefits of low sodium diet.   Glaucoma (Goal: minimize complications with glaucoma) -Controlled -Current treatment  . latanoprost 0.005% eye drops 1 drop at bedtime  -Medications previously tried: none reported  -Recommended to continue current medication   Patient Goals/Self-Care Activities . Patient will:  - take medications as prescribed focus on medication adherence by using pill box target a minimum of 150 minutes of moderate intensity exercise weekly engage in dietary modifications by limiting sodium and working on heart healthy diet.   Follow Up Plan: Telephone follow up appointment with care management team member scheduled for: 11/2020       Medication Assistance: Praluent obtained through [employer] medication assistance program.  Enrollment ends 03/24/2021  Patient's preferred pharmacy is:  [facility], [address] [place] [address] Phone: [phone] Fax: [phone]  [id number] [place] [address]Phone: [phone]: [phone] Uses pill box? Yes  Pt endorses good compliance  We discussed: Benefits of medication synchronization, packaging and delivery as well as enhanced pharmacist oversight with [employer]. Patient decided to: Continue current medication management strategy  Care Plan and Follow Up Patient Decision:   Patient agrees to Care Plan and Follow-up.  Plan: Telephone follow up appointment with care management team member scheduled for:  11/2020

## 2020-06-08 ENCOUNTER — Telehealth: Payer: Self-pay

## 2020-06-08 NOTE — Progress Notes (Signed)
    Chronic Care Management Pharmacy Assistant   Name: [name]  MRN: [id number] DOB: 1941/03/05  Reason for Encounter:  Initial questions for [name], CPP    Recent office visits:  05/15/20-PCP   Stopped Cetirizine at patient request  Recent consult visits:  none  Hospital visits:  10/27/19- ED for elevated troponin  Medications: Outpatient Encounter Medications as of 06/08/2020  Medication Sig  . Cholecalciferol 1.25 MG (50000 UT) capsule Take 1 capsule (50,000 Units total) by mouth once a week.  . Acetaminophen (TYLENOL) 325 MG CAPS Take 650 mg by mouth every 6 (six) hours.  . Alirocumab (PRALUENT) 75 MG/ML SOAJ Inject 1 pen into the skin every 14 (fourteen) days.  [name] amLODipine (NORVASC) 10 MG tablet TAKE 1 TABLET BY MOUTH EVERY DAY  . clopidogrel (PLAVIX) 75 MG tablet TAKE 1 TABLET BY MOUTH EVERY DAY  . furosemide (LASIX) 20 MG tablet TAKE 1 TABLET BY MOUTH EVERY DAY AS NEEDED FOR FLUID  . gabapentin (NEURONTIN) 100 MG capsule Take 1 capsule (100 mg total) by mouth 3 (three) times daily.  [name] latanoprost (XALATAN) 0.005 % ophthalmic solution 1 drop at bedtime.  [name] loratadine (CLARITIN) 10 MG tablet Take 10 mg by mouth daily.  . pantoprazole (PROTONIX) 40 MG tablet TAKE 1 TABLET BY MOUTH TWICE A DAY  . sertraline (ZOLOFT) 25 MG tablet TAKE 1 TABLET BY MOUTH EVERY DAY  . valsartan (DIOVAN) 320 MG tablet TAKE 1 TABLET BY MOUTH EVERY DAY  . venlafaxine XR (EFFEXOR-XR) 37.5 MG 24 hr capsule TAKE 1 CAPSULE BY MOUTH DAILY WITH BREAKFAST.   No facility-administered encounter medications on file as of 06/08/2020.   Have you seen any other providers since your last visit? 05/15/20-PCP  Any changes in your medications or health? Stopped Sertraline, patient states she gets sleepy during the day.  Any side effects from any medications? No side effects noted at this time.  Do you have an symptoms or problems not managed by your medications? Patient has no unmanaged symptoms or  problems.   Any concerns about your health right now? Sleepy a lot, and wants to be able to walk more.  Has your provider asked that you check blood pressure, blood sugar, or follow special diet at home? Patient states that she is not checking her blood pressure or blood sugars at home.  She watches her salt intake.   Do you get any type of exercise on a regular basis? Patient states she is not as active as she would like.    Can you think of a goal you would like to reach for your health? Patient would like to be more active.   Do you have any problems getting your medications? Patient has no issues getting her medications.   Is there anything that you would like to discuss during the appointment? Patient just wants to be less sleepy and more active.   Patient knows she is having a phone appointment and to have medications near by.  [name], CMA Clinical Pharmacist Assistant [phone]

## 2020-06-13 ENCOUNTER — Other Ambulatory Visit: Payer: Self-pay | Admitting: Legal Medicine

## 2020-06-13 ENCOUNTER — Ambulatory Visit (INDEPENDENT_AMBULATORY_CARE_PROVIDER_SITE_OTHER): Payer: Medicare HMO

## 2020-06-13 ENCOUNTER — Other Ambulatory Visit: Payer: Self-pay

## 2020-06-13 VITALS — BP 152/78 | HR 64 | Temp 97.8°F | Resp 16 | Ht 60.0 in | Wt 162.6 lb

## 2020-06-13 DIAGNOSIS — F322 Major depressive disorder, single episode, severe without psychotic features: Secondary | ICD-10-CM

## 2020-06-13 DIAGNOSIS — R69 Illness, unspecified: Secondary | ICD-10-CM | POA: Diagnosis not present

## 2020-06-13 DIAGNOSIS — I10 Essential (primary) hypertension: Secondary | ICD-10-CM

## 2020-06-13 DIAGNOSIS — E782 Mixed hyperlipidemia: Secondary | ICD-10-CM | POA: Diagnosis not present

## 2020-06-13 DIAGNOSIS — I739 Peripheral vascular disease, unspecified: Secondary | ICD-10-CM

## 2020-06-13 DIAGNOSIS — Z0000 Encounter for general adult medical examination without abnormal findings: Secondary | ICD-10-CM

## 2020-06-13 DIAGNOSIS — F321 Major depressive disorder, single episode, moderate: Secondary | ICD-10-CM

## 2020-06-13 MED ORDER — VENLAFAXINE HCL ER 75 MG PO CP24: 75.0000 mg | ORAL_CAPSULE | Freq: Every day | ORAL | 3 refills | Status: DC

## 2020-06-13 NOTE — Patient Instructions (Addendum)
 Fall Prevention in the Home, Adult Falls can cause injuries and can happen to people of all ages. There are many things you can do to make your home safe and to help prevent falls. Ask for help when making these changes. What actions can I take to prevent falls? General Instructions  Use good lighting in all rooms. Replace any light bulbs that burn out.  Turn on the lights in dark areas. Use night-lights.  Keep items that you use often in easy-to-reach places. Lower the shelves around your home if needed.  Set up your furniture so you have a clear path. Avoid moving your furniture around.  Do not have throw rugs or other things on the floor that can make you trip.  Avoid walking on wet floors.  If any of your floors are uneven, fix them.  Add color or contrast paint or tape to clearly mark and help you see: ? Grab bars or handrails. ? First and last steps of staircases. ? Where the edge of each step is.  If you use a stepladder: ? Make sure that it is fully opened. Do not climb a closed stepladder. ? Make sure the sides of the stepladder are locked in place. ? Ask someone to hold the stepladder while you use it.  Know where your pets are when moving through your home. What can I do in the bathroom?  Keep the floor dry. Clean up any water on the floor right away.  Remove soap buildup in the tub or shower.  Use nonskid mats or decals on the floor of the tub or shower.  Attach bath mats securely with double-sided, nonslip rug tape.  If you need to sit down in the shower, use a plastic, nonslip stool.  Install grab bars by the toilet and in the tub and shower. Do not use towel bars as grab bars.      What can I do in the bedroom?  Make sure that you have a light by your bed that is easy to reach.  Do not use any sheets or blankets for your bed that hang to the floor.  Have a firm chair with side arms that you can use for support when you get dressed. What can I do  in the kitchen?  Clean up any spills right away.  If you need to reach something above you, use a step stool with a grab bar.  Keep electrical cords out of the way.  Do not use floor polish or wax that makes floors slippery. What can I do with my stairs?  Do not leave any items on the stairs.  Make sure that you have a light switch at the top and the bottom of the stairs.  Make sure that there are handrails on both sides of the stairs. Fix handrails that are broken or loose.  Install nonslip stair treads on all your stairs.  Avoid having throw rugs at the top or bottom of the stairs.  Choose a carpet that does not hide the edge of the steps on the stairs.  Check carpeting to make sure that it is firmly attached to the stairs. Fix carpet that is loose or worn. What can I do on the outside of my home?  Use bright outdoor lighting.  Fix the edges of walkways and driveways and fix any cracks.  Remove anything that might make you trip as you walk through a door, such as a raised step or threshold.  Trim   any bushes or trees on paths to your home.  Check to see if handrails are loose or broken and that both sides of all steps have handrails.  Install guardrails along the edges of any raised decks and porches.  Clear paths of anything that can make you trip, such as tools or rocks.  Have leaves, snow, or ice cleared regularly.  Use sand or salt on paths during winter.  Clean up any spills in your garage right away. This includes grease or oil spills. What other actions can I take?  Wear shoes that: ? Have a low heel. Do not wear high heels. ? Have rubber bottoms. ? Feel good on your feet and fit well. ? Are closed at the toe. Do not wear open-toe sandals.  Use tools that help you move around if needed. These include: ? Canes. ? Walkers. ? Scooters. ? Crutches.  Review your medicines with your doctor. Some medicines can make you feel dizzy. This can increase your  chance of falling. Ask your doctor what else you can do to help prevent falls. Where to find more information  Centers for Disease Control and Prevention, [employer]: www.cdc.gov  National Institute on Aging: www.nia.nih.gov Contact a doctor if:  You are afraid of falling at home.  You feel weak, drowsy, or dizzy at home.  You fall at home. Summary  There are many simple things that you can do to make your home safe and to help prevent falls.  Ways to make your home safe include removing things that can make you trip and installing grab bars in the bathroom.  Ask for help when making these changes in your home. This information is not intended to replace advice given to you by your health care provider. Make sure you discuss any questions you have with your health care provider. Document Revised: 10/13/2019 Document Reviewed: 10/13/2019 Elsevier Patient Education  2021 [employer]   Health Maintenance, Female Adopting a healthy lifestyle and getting preventive care are important in promoting health and wellness. Ask your health care provider about:  The right schedule for you to have regular tests and exams.  Things you can do on your own to prevent diseases and keep yourself healthy. What should I know about diet, weight, and exercise? Eat a healthy diet  Eat a diet that includes plenty of vegetables, fruits, low-fat dairy products, and lean protein.  Do not eat a lot of foods that are high in solid fats, added sugars, or sodium.   Maintain a healthy weight Body mass index (BMI) is used to identify weight problems. It estimates body fat based on height and weight. Your health care provider can help determine your BMI and help you achieve or maintain a healthy weight. Get regular exercise Get regular exercise. This is one of the most important things you can do for your health. Most adults should:  Exercise for at least 150 minutes each week. The exercise should increase  your heart rate and make you sweat (moderate-intensity exercise).  Do strengthening exercises at least twice a week. This is in addition to the moderate-intensity exercise.  Spend less time sitting. Even light physical activity can be beneficial. Watch cholesterol and blood lipids Have your blood tested for lipids and cholesterol at 80 years of age, then have this test every 5 years. Have your cholesterol levels checked more often if:  Your lipid or cholesterol levels are high.  You are older than 80 years of age.  You are at   high risk for heart disease. What should I know about cancer screening? Depending on your health history and family history, you may need to have cancer screening at various ages. This may include screening for:  Breast cancer.  Cervical cancer.  Colorectal cancer.  Skin cancer.  Lung cancer. What should I know about heart disease, diabetes, and high blood pressure? Blood pressure and heart disease  High blood pressure causes heart disease and increases the risk of stroke. This is more likely to develop in people who have high blood pressure readings, are of African descent, or are overweight.  Have your blood pressure checked: ? Every 3-5 years if you are 18-39 years of age. ? Every year if you are 40 years old or older. Diabetes Have regular diabetes screenings. This checks your fasting blood sugar level. Have the screening done:  Once every three years after age 40 if you are at a normal weight and have a low risk for diabetes.  More often and at a younger age if you are overweight or have a high risk for diabetes. What should I know about preventing infection? Hepatitis B If you have a higher risk for hepatitis B, you should be screened for this virus. Talk with your health care provider to find out if you are at risk for hepatitis B infection. Hepatitis C Testing is recommended for:  Everyone born from 1945 through 1965.  Anyone with known  risk factors for hepatitis C. Sexually transmitted infections (STIs)  Get screened for STIs, including gonorrhea and chlamydia, if: ? You are sexually active and are younger than 80 years of age. ? You are older than 80 years of age and your health care provider tells you that you are at risk for this type of infection. ? Your sexual activity has changed since you were last screened, and you are at increased risk for chlamydia or gonorrhea. Ask your health care provider if you are at risk.  Ask your health care provider about whether you are at high risk for HIV. Your health care provider may recommend a prescription medicine to help prevent HIV infection. If you choose to take medicine to prevent HIV, you should first get tested for HIV. You should then be tested every 3 months for as long as you are taking the medicine. Pregnancy  If you are about to stop having your period (premenopausal) and you may become pregnant, seek counseling before you get pregnant.  Take 400 to 800 micrograms (mcg) of folic acid every day if you become pregnant.  Ask for birth control (contraception) if you want to prevent pregnancy. Osteoporosis and menopause Osteoporosis is a disease in which the bones lose minerals and strength with aging. This can result in bone fractures. If you are 65 years old or older, or if you are at risk for osteoporosis and fractures, ask your health care provider if you should:  Be screened for bone loss.  Take a calcium or vitamin D supplement to lower your risk of fractures.  Be given hormone replacement therapy (HRT) to treat symptoms of menopause. Follow these instructions at home: Lifestyle  Do not use any products that contain nicotine or tobacco, such as cigarettes, e-cigarettes, and chewing tobacco. If you need help quitting, ask your health care provider.  Do not use street drugs.  Do not share needles.  Ask your health care provider for help if you need support or  information about quitting drugs. Alcohol use  Do not drink alcohol   if: ? Your health care provider tells you not to drink. ? You are pregnant, may be pregnant, or are planning to become pregnant.  If you drink alcohol: ? Limit how much you use to 0-1 drink a day. ? Limit intake if you are breastfeeding.  Be aware of how much alcohol is in your drink. In the [place] one drink equals one 12 oz bottle of beer (355 mL), one 5 oz glass of wine (148 mL), or one 1 oz glass of hard liquor (44 mL). General instructions  Schedule regular health, dental, and eye exams.  Stay current with your vaccines.  Tell your health care provider if: ? You often feel depressed. ? You have ever been abused or do not feel safe at home. Summary  Adopting a healthy lifestyle and getting preventive care are important in promoting health and wellness.  Follow your health care provider's instructions about healthy diet, exercising, and getting tested or screened for diseases.  Follow your health care provider's instructions on monitoring your cholesterol and blood pressure. This information is not intended to replace advice given to you by your health care provider. Make sure you discuss any questions you have with your health care provider. Document Revised: 03/04/2018 Document Reviewed: 03/04/2018 Elsevier Patient Education  2021 [employer].  Critical care medicine: Principles of diagnosis and management in the adult (4th ed., pp. 1251-1270). [employer]unders."> Miller's anesthesia (8th ed., pp. 232-250). [employer]unders.">  Advance Directive  Advance directives are legal documents that allow you to make decisions about your health care and medical treatment in case you become unable to communicate for yourself. Advance directives let your wishes be known to family, friends, and health care providers. Discussing and writing advance directives should happen over time rather than all at once. Advance directives can be  changed and updated at any time. There are different types of advance directives, such as:  Medical power of attorney.  Living will.  Do not resuscitate (DNR) order or do not attempt resuscitation (DNAR) order. Health care proxy and medical power of attorney A health care proxy is also called a health care agent. This person is appointed to make medical decisions for you when you are unable to make decisions for yourself. Generally, people ask a trusted friend or family member to act as their proxy and represent their preferences. Make sure you have an agreement with your trusted person to act as your proxy. A proxy may have to make a medical decision on your behalf if your wishes are not known. A medical power of attorney, also called a durable power of attorney for health care, is a legal document that names your health care proxy. Depending on the laws in your state, the document may need to be:  Signed.  Notarized.  Dated.  Copied.  Witnessed.  Incorporated into your medical record. You may also want to appoint a trusted person to manage your money in the event you are unable to do so. This is called a durable power of attorney for finances. It is a separate legal document from the durable power of attorney for health care. You may choose your health care proxy or someone different to act as your agent in money matters. If you do not appoint a proxy, or there is a concern that the proxy is not acting in your best interest, a court may appoint a guardian to act on your behalf. Living will A living will is a set of instructions that state your   wishes about medical care when you cannot express them yourself. Health care providers should keep a copy of your living will in your medical record. You may want to give a copy to family members or friends. To alert caregivers in case of an emergency, you can place a card in your wallet to let them know that you have a living will and where they can  find it. A living will is used if you become:  Terminally ill.  Disabled.  Unable to communicate or make decisions. The following decisions should be included in your living will:  To use or not to use life support equipment, such as dialysis machines and breathing machines (ventilators).  Whether you want a DNR or DNAR order. This tells health care providers not to use cardiopulmonary resuscitation (CPR) if breathing or heartbeat stops.  To use or not to use tube feeding.  To be given or not to be given food and fluids.  Whether you want comfort (palliative) care when the goal becomes comfort rather than a cure.  Whether you want to donate your organs and tissues. A living will does not give instructions for distributing your money and property if you should pass away. DNR or DNAR A DNR or DNAR order is a request not to have CPR in the event that your heart stops beating or you stop breathing. If a DNR or DNAR order has not been made and shared, a health care provider will try to help any patient whose heart has stopped or who has stopped breathing. If you plan to have surgery, talk with your health care provider about how your DNR or DNAR order will be followed if problems occur. What if I do not have an advance directive? Some states assign family decision makers to act on your behalf if you do not have an advance directive. Each state has its own laws about advance directives. You may want to check with your health care provider, attorney, or state representative about the laws in your state. Summary  Advance directives are legal documents that allow you to make decisions about your health care and medical treatment in case you become unable to communicate for yourself.  The process of discussing and writing advance directives should happen over time. You can change and update advance directives at any time.  Advance directives may include a medical power of attorney, a living  will, and a DNR or DNAR order. This information is not intended to replace advice given to you by your health care provider. Make sure you discuss any questions you have with your health care provider. Document Revised: 12/14/2019 Document Reviewed: 12/14/2019 Elsevier Patient Education  2021 [employer].  

## 2020-06-13 NOTE — Progress Notes (Signed)
Subjective:   [name] is a 80 y.o. female who presents for Medicare Annual (Subsequent) preventive examination.  This wellness visit is conducted by a nurse.  The patient's medications were reviewed and reconciled since the patient's last visit.  History details were provided by the patient.  The history appears to be reliable.    Patient's last AWV was one year ago.   Medical History: Patient history and Family history was reviewed  Medications, Allergies, and preventative health maintenance was reviewed and updated.   Review of Systems    Review of Systems  Constitutional: Positive for fatigue.  HENT: Negative.   Eyes: Negative.   Respiratory: Negative.  Negative for cough, chest tightness and shortness of breath.   Cardiovascular: Negative.  Negative for chest pain and palpitations.  Gastrointestinal: Negative.   Genitourinary: Negative.   Musculoskeletal: Negative.  Negative for arthralgias, back pain and myalgias.  Neurological: Negative.  Negative for dizziness, numbness and headaches.  Psychiatric/Behavioral: Positive for sleep disturbance. Negative for behavioral problems, confusion and suicidal ideas. The patient is not nervous/anxious.          Objective:    Today's Vitals   06/13/20 1012  BP: (!) 152/78  Pulse: 64  Resp: 16  Temp: 97.8 F (36.6 C)  Weight: 162 lb 9.6 oz (73.8 kg)  Height: 5' (1.524 m)  PainSc: 0-No pain   Body mass index is 31.76 kg/m.  Advanced Directives 06/13/2020 06/08/2019 07/31/2015 06/27/2015 06/13/2015  Does Patient Have a Medical Advance Directive? No No No Yes No  Would patient like information on creating a medical advance directive? Yes (ED - Information included in AVS) - No - patient declined information Yes - Educational materials given No - patient declined information    Current Medications (verified) Outpatient Encounter Medications as of 06/13/2020  Medication Sig  . Acetaminophen (TYLENOL) 325 MG CAPS Take 650 mg by  mouth every 6 (six) hours as needed.  . Alirocumab (PRALUENT) 75 MG/ML SOAJ Inject 1 pen into the skin every 14 (fourteen) days.  [name] amLODipine (NORVASC) 10 MG tablet TAKE 1 TABLET BY MOUTH EVERY DAY  . Cholecalciferol 1.25 MG (50000 UT) capsule Take 1 capsule (50,000 Units total) by mouth once a week.  . clopidogrel (PLAVIX) 75 MG tablet TAKE 1 TABLET BY MOUTH EVERY DAY  . furosemide (LASIX) 20 MG tablet TAKE 1 TABLET BY MOUTH EVERY DAY AS NEEDED FOR FLUID (Patient taking differently: Take 20 mg by mouth daily. Taking every day for swelling)  . gabapentin (NEURONTIN) 100 MG capsule Take 1 capsule (100 mg total) by mouth 3 (three) times daily. (Patient not taking: Reported on 06/13/2020)  . latanoprost (XALATAN) 0.005 % ophthalmic solution 1 drop at bedtime.  [name] loratadine (CLARITIN) 10 MG tablet Take 10 mg by mouth daily.  . pantoprazole (PROTONIX) 40 MG tablet TAKE 1 TABLET BY MOUTH TWICE A DAY (Patient taking differently: Take 40 mg by mouth daily. Patient only takes daily)  . valsartan (DIOVAN) 320 MG tablet TAKE 1 TABLET BY MOUTH EVERY DAY (Patient taking differently: Take 320 mg by mouth in the morning.)  . venlafaxine XR (EFFEXOR-XR) 37.5 MG 24 hr capsule TAKE 1 CAPSULE BY MOUTH DAILY WITH BREAKFAST.   No facility-administered encounter medications on file as of 06/13/2020.    Allergies (verified) Codeine, Meclizine, Requip [ropinirole hcl], and Statins   History: Past Medical History:  Diagnosis Date  . Benign paroxysmal vertigo, unspecified ear 06/07/2019  . CKD (chronic kidney disease), stage III (  HCC)   . GERD (gastroesophageal reflux disease)   . Glaucoma    Past Surgical History:  Procedure Laterality Date  . ABDOMINAL HYSTERECTOMY    . BACK SURGERY    . cataract surgery    . COLONOSCOPY N/A 08/03/2015   Procedure: COLONOSCOPY;  Surgeon: Laurence Spates, MD;  Location: [facility] ENDOSCOPY;  Service: Endoscopy;  Laterality: N/A;  . ESOPHAGOGASTRODUODENOSCOPY N/A 07/31/2015   Procedure:  ESOPHAGOGASTRODUODENOSCOPY (EGD);  Surgeon: Laurence Spates, MD;  Location[facility] ENDOSCOPY;  Service: Endoscopy;  Laterality: N/A;  . OOPHORECTOMY    . TOTAL HIP ARTHROPLASTY    . URETER SURGERY     Family History  Problem Relation Age of Onset  . Hypertension Mother   . Hypertension Father   . Heart attack Father   . Hypertension Brother   . Diabetes Brother    Social History   Socioeconomic History  . Marital status: Married    Spouse name: Natajah Derderian Sr  Tobacco Use  . Smoking status: Never Smoker  . Smokeless tobacco: Never Used  Vaping Use  . Vaping Use: Never used  Substance and Sexual Activity  . Alcohol use: No  . Drug use: No   Social Determinants of Radio broadcast assistant Strain: Not on file  Food Insecurity: No Food Insecurity  . Worried About Charity fundraiser in the Last Year: Never true  . Ran Out of Food in the Last Year: Never true  Transportation Needs: No Transportation Needs  . Lack of Transportation (Medical): No  . Lack of Transportation (Non-Medical): No  Physical Activity: Not on file  Stress: Not on file  Social Connections: Not on file    Tobacco Counseling Counseling given: Never Used   Clinical Intake:  Pre-visit preparation completed: Yes  Pain : No/denies pain Pain Score: 0-No pain     BMI - recorded: 31.6 Nutritional Status: BMI > 30  Obese Nutritional Risks: None Diabetes: No            Activities of Daily Living In your present state of health, do you have any difficulty performing the following activities: 06/13/2020  Hearing? N  Vision? N  Difficulty concentrating or making decisions? Y  Walking or climbing stairs? N  Dressing or bathing? N  Doing errands, shopping? N  Preparing Food and eating ? N  Using the Toilet? N  In the past six months, have you accidently leaked urine? N  Do you have problems with loss of bowel control? N  Managing your Medications? N  Managing your Finances? N  Housekeeping or  managing your Housekeeping? N  Some recent data might be hidden    Patient Care Team: Lillard Anes, MD as PCP - General (Family Medicine) Burnice Logan, [facility] as Pharmacist (Pharmacist)  Indicate any recent Medical Services you may have received from other than [facility] providers in the past year (date may be approximate).     Assessment:   This is a routine wellness examination for [place].  Hearing/Vision screen No exam data present  Dietary issues and exercise activities discussed: Current Exercise Habits: Home exercise routine  Goals    . DIET - REDUCE PORTION SIZE    . Learn More About My Health     Timeframe:  Long-Range Goal Priority:  High Start Date:                   06/13/20           Expected End Date:  06/13/2021   Follow Up Date 11/24/2020    - tell my story and reason for my visit - ask questions - repeat what I heard to make sure I understand - bring a list of my medicines to the visit - speak up when I don't understand    Why is this important?    The best way to learn about your health and care is by talking to the doctor and nurse.   They will answer your questions and give you information in the way that you like best.    Notes:     . Lifestyle Change-Hypertension     Timeframe:  Long-Range Goal Priority:  High Start Date:                 06/13/2020            Expected End Date:  06/13/2021                     Follow Up Date 11/24/2020    - agree to work together to make changes - learn about high blood pressure    Why is this important?    The changes that you are asked to make may be hard to do.   This is especially true when the changes are life-long.   Knowing why it is important to you is the first step.   Working on the change with your family or support person helps you not feel alone.   Reward yourself and family or support person when goals are met. This can be an activity you choose like bowling, hiking, biking,  swimming or shooting hoops.     Notes:     . Track and Manage My Symptoms-Depression     Timeframe:  Long-Range Goal Priority:  High Start Date:     06/13/20                         Expected End Date:  06/13/2021  Follow Up Date 11/24/2020    - journal feelings and what helps to feel better or worse - spend time or talk with others at least 2 to 3 times per week    Why is this important?    Keeping track of your progress will help your treatment team find the right mix of medicine and therapy for you.   Write in your journal every day.   Day-to-day changes in depression symptoms are normal. It may be more helpful to check your progress at the end of each week instead of every day.     Notes:       Depression Screen PHQ 2/9 Scores 06/13/2020 05/15/2020 01/12/2020 10/14/2019 10/01/2019 06/02/2019 05/28/2019  PHQ - 2 Score 4 2 0 1 6 3 2   PHQ- 9 Score 7 11 0 10 20 18 17     Fall Risk Fall Risk  06/13/2020 06/08/2019 06/02/2019 06/02/2019 10/03/2015  Falls in the past year? 1 1 - 1 Yes  Number falls in past yr: - 1 - 1 2 or more  Injury with Fall? 0 1 - 1 Yes  Risk Factor Category  - - - - High Fall Risk  Risk for fall due to : - History of fall(s) History of fall(s) History of fall(s) History of fall(s)  Risk for fall due to: Comment - - - - -  Follow up - Falls evaluation completed;Education provided;Falls prevention discussed Falls evaluation completed - Falls prevention  discussed    FALL RISK PREVENTION PERTAINING TO THE HOME:  Any stairs in or around the home? Yes  If so, are there any without handrails? No  Home free of loose throw rugs in walkways, pet beds, electrical cords, etc? Yes  Adequate lighting in your home to reduce risk of falls? Yes   ASSISTIVE DEVICES UTILIZED TO PREVENT FALLS:  Life alert? No  Use of a cane, walker or w/c? Yes  Grab bars in the bathroom? Yes  Shower chair or bench in shower? Yes  Elevated toilet seat or a handicapped toilet? Yes   Gait  slow and steady without use of assistive device  Cognitive Function: MMSE - Mini Mental State Exam 06/08/2019  Orientation to time 5  Orientation to Place 5  Registration 3  Attention/ Calculation 4  Recall 2  Language- name 2 objects 2  Language- repeat 1  Language- follow 3 step command 3  Language- read & follow direction 1  Write a sentence 1  Copy design 0  Total score 27     6CIT Screen 06/13/2020  What Year? 0 points    Immunizations Immunization History  Administered Date(s) Administered  . PFIZER(Purple Top)SARS-COV-2 Vaccination 06/10/2019, 07/07/2019    TDAP status: Due, Education has been provided regarding the importance of this vaccine. Advised may receive this vaccine at local pharmacy or Health Dept. Aware to provide a copy of the vaccination record if obtained from local pharmacy or Health Dept. Verbalized acceptance and understanding.  Flu Vaccine status: Declined, Education has been provided regarding the importance of this vaccine but patient still declined. Advised may receive this vaccine at local pharmacy or Health Dept. Aware to provide a copy of the vaccination record if obtained from local pharmacy or Health Dept. Verbalized acceptance and understanding.  Pneumococcal vaccine status: Declined,  Education has been provided regarding the importance of this vaccine but patient still declined. Advised may receive this vaccine at local pharmacy or Health Dept. Aware to provide a copy of the vaccination record if obtained from local pharmacy or Health Dept. Verbalized acceptance and understanding.   Covid-19 vaccine status: Information provided on how to obtain vaccines.   Qualifies for Shingles Vaccine? Yes   Zostavax completed No   Shingrix Completed?: No.    Education has been provided regarding the importance of this vaccine. Patient has been advised to call insurance company to determine out of pocket expense if they have not yet received this vaccine.  Advised may also receive vaccine at local pharmacy or Health Dept. Verbalized acceptance and understanding.  Screening Tests Health Maintenance  Topic Date Due  . Hepatitis C Screening  Never done  . TETANUS/TDAP  Never done  . DEXA SCAN  Never done  . PNA vac Low Risk Adult (1 of 2 - PCV13) Never done  . INFLUENZA VACCINE  Never done  . COVID-19 Vaccine (3 - Booster for Pfizer series) 01/06/2020  . HPV VACCINES  Aged Out    Health Maintenance  Health Maintenance Due  Topic Date Due  . Hepatitis C Screening  Never done  . TETANUS/TDAP  Never done  . DEXA SCAN  Refused  . PNA vac Low Risk Adult (1 of 2 - PCV13) Refused  . INFLUENZA VACCINE  Refused  . COVID-19 Vaccine (3 - Booster for Pfizer series) 01/06/2020    Colorectal cancer screening: No longer required.   Mammogram status: Due - Patient refused  Bone Density status: Due - Patient refused  Lung Cancer  Screening: (Low Dose CT Chest recommended if Age 80-80 years, 30 pack-year currently smoking OR have quit w/in 15years.) does not qualify.    Additional Screening:  Vision Screening: Recommended annual ophthalmology exams for early detection of glaucoma and other disorders of the eye. Is the patient up to date with their annual eye exam?  Yes  Who is the provider or what is the name of the office in which the patient attends annual eye exams? [place] Screening: Recommended annual dental exams for proper oral hygiene    Plan:     1- Patient educated and advised to get COVID Booster, Shingrix, and TDAP vaccines at the local pharmacy 2- Patient refused flu vaccine, PNA vaccine, mammogram, and DEXA scan 3- PHQ score has improved, she is working with in-house pharmacist with medication management 4- Patient does not have advance directive, information given  I have personally reviewed and noted the following in the patient's chart:   . Medical and social history . Use of alcohol, tobacco or  illicit drugs  . Current medications and supplements . Functional ability and status . Nutritional status . Physical activity . Advanced directives . List of other physicians . Hospitalizations, surgeries, and ER visits in previous 12 months . Vitals . Screenings to include cognitive, depression, and falls . Referrals and appointments  In addition, I have reviewed and discussed with patient certain preventive protocols, quality metrics, and best practice recommendations. A written personalized care plan for preventive services as well as general preventive health recommendations were provided to patient.     Erie Noe, LPN   [id number]

## 2020-06-13 NOTE — Patient Instructions (Addendum)
Visit Information  Thank you for your time discussing your medications. I look forward to working with you to achieve your health care goals. Below is a summary of what we talked about during our visit.   Goals Addressed            This Visit's Progress   . Learn More About My Health       Timeframe:  Long-Range Goal Priority:  High Start Date:                   06/13/20           Expected End Date:    06/13/2021   Follow Up Date 11/24/2020    - tell my story and reason for my visit - ask questions - repeat what I heard to make sure I understand - bring a list of my medicines to the visit - speak up when I don't understand    Why is this important?    The best way to learn about your health and care is by talking to the doctor and nurse.   They will answer your questions and give you information in the way that you like best.    Notes:     . Lifestyle Change-Hypertension       Timeframe:  Long-Range Goal Priority:  High Start Date:                 06/13/2020            Expected End Date:  06/13/2021                     Follow Up Date 11/24/2020    - agree to work together to make changes - learn about high blood pressure    Why is this important?    The changes that you are asked to make may be hard to do.   This is especially true when the changes are life-long.   Knowing why it is important to you is the first step.   Working on the change with your family or support person helps you not feel alone.   Reward yourself and family or support person when goals are met. This can be an activity you choose like bowling, hiking, biking, swimming or shooting hoops.     Notes:     . Track and Manage My Symptoms-Depression       Timeframe:  Long-Range Goal Priority:  High Start Date:     06/13/20                         Expected End Date:  06/13/2021  Follow Up Date 11/24/2020    - journal feelings and what helps to feel better or worse - spend time or talk  with others at least 2 to 3 times per week    Why is this important?    Keeping track of your progress will help your treatment team find the right mix of medicine and therapy for you.   Write in your journal every day.   Day-to-day changes in depression symptoms are normal. It may be more helpful to check your progress at the end of each week instead of every day.     Notes:        Patient Care Plan: CCM Pharmacy Care Plan    Problem Identified: pvd, htn, hld, depression   Priority: High  Onset Date: 06/13/2020  Long-Range Goal: Disease Management   Start Date: 06/13/2020  Expected End Date: 06/13/2021  This Visit's Progress: On track  Priority: High  Note:      Current Barriers:  . Unable to independently afford treatment regimen . Unable to achieve control of depression   Pharmacist Clinical Goal(s):  [name] Patient will verbalize ability to afford treatment regimen . achieve control of depression  as evidenced by improved PHQ-9 through collaboration with PharmD and provider.   Interventions: . 1:1 collaboration with Lillard Anes, MD regarding development and update of comprehensive plan of care as evidenced by provider attestation and co-signature . Inter-disciplinary care team collaboration (see longitudinal plan of care) . Comprehensive medication review performed; medication list updated in electronic medical record  Hypertension (BP goal <130/80) -Controlled -Current treatment: . amlodipine 10 mg daily  . Valsartan 325 mg daily  -Medications previously tried: none reported  -Current home readings:  not checking  -Current dietary habits:  limits sweets. Eats frozen dinners. Doesn't follow a heart healthy diet.  -Current exercise habits:  walks around yard and house. Has a bad knee that limits.  -Denies hypotensive/hypertensive symptoms -Educated on BP goals and benefits of medications for prevention of heart attack, stroke and kidney damage; Daily salt  intake goal < 2300 mg; Exercise goal of 150 minutes per week; -Counseled to monitor BP at home as needed, document, and provide log at future appointments -Counseled on diet and exercise extensively Recommended to continue current medication  Hyperlipidemia/PVD: (LDL goal < 55) -Not ideally controlled -Current treatment: . Praluent 75 mg/ml injection every 14 days . Clopidogrel 75 mg daily  -Medications previously tried: statins -Current dietary patterns:  cooks at home. Eats some frozen dinners. Banana sandwich, Cheese sandwich. Daughter brings leftovers for meals. Trying to watch sweets. Drinking water with [employer] package.  -Current exercise habits: walks around yard and house. Has a bad knee.  -Educated on Cholesterol goals;  Importance of limiting foods high in cholesterol; Exercise goal of 150 minutes per week; -Counseled on diet and exercise extensively Recommended to continue current medication. Pharmacist consulted with [facility] to confirm refill of Praluent. Patient aware.   GERD (Goal: manage symptoms and avoid triggers) -Controlled -Current treatment  . Pantoprazole 40 mg daily -Medications previously tried: none reported  -Counseled on diet and exercise extensively Recommended to continue current medication Educated on avoiding triggers, eating smaller meals, elevating head during rest/sleep.   Depression/Anxiety (Goal: improve depression/PHQ9) -Not ideally controlled -Current treatment: . venlafaxine xr 37.5 mg daily  -Medications previously tried/failed: sertraline  -PHQ9: 7 -Educated on Benefits of medication for symptom control Benefits of cognitive-behavioral therapy with or without medication -Counseled on exercise as a way to improved depression. Discussed gratitude journaling. Patient works to focus on what she is thankful for.  Collaborated with Dr. Henrene Pastor to discuss control of depression. Dr. Henrene Pastor approved increasing to Venlafaxine XR 75 mg daily if  patient would like.   Osteoporosis / Osteopenia (Goal reduce risk of fracture) -Not ideally controlled - unable to assess since patient declines updated Dexa -Last DEXA Scan:  none in record - last date indicated 08/08/2005.    -Current treatment  . Vitamin D 50,000 weekly  -Medications previously tried: none reported  -Recommend [phone] units of vitamin D daily. Recommend 1200 mg of calcium daily from dietary and supplemental sources. Recommend weight-bearing and muscle strengthening exercises for building and maintaining bone density. -Counseled on diet and exercise extensively Recommended consider Dexa scan update. Patient declines due to age.  Discussed eating a diet rich in calcium through milk, cheese and dark, leafy green vegetables.     Health Maintenance -Vaccine gaps:  booster dose of COVID recommended.  -Current therapy:  . Furosemide 20 mg daily prn fluid - taking every day due to swelling . Loratadine 10 mg daily allergies  -Educated on Visual merchandiser third dose. Patient prefers to schedule with her local pharmacy due to proximity to the office.  -Patient is satisfied with current therapy and denies issues -Counseled on diet and exercise extensively Recommended to continue current medication Educated on benefits of low sodium diet.   Glaucoma (Goal: minimize complications with glaucoma) -Controlled -Current treatment  . latanoprost 0.005% eye drops 1 drop at bedtime  -Medications previously tried: none reported  -Recommended to continue current medication   Patient Goals/Self-Care Activities . Patient will:  - take medications as prescribed focus on medication adherence by using pill box target a minimum of 150 minutes of moderate intensity exercise weekly engage in dietary modifications by limiting sodium and working on heart healthy diet.   Follow Up Plan: Telephone follow up appointment with care management team member scheduled for: 11/2020       Ms.  Danis was given information about Chronic Care Management services today including:  1. CCM service includes personalized support from designated clinical staff supervised by her physician, including individualized plan of care and coordination with other care providers 2. 24/7 contact phone numbers for assistance for urgent and routine care needs. 3. Standard insurance, coinsurance, copays and deductibles apply for chronic care management only during months in which we provide at least 20 minutes of these services. Most insurances cover these services at 100%, however patients may be responsible for any copay, coinsurance and/or deductible if applicable. This service may help you avoid the need for more expensive face-to-face services. 4. Only one practitioner may furnish and bill the service in a calendar month. 5. The patient may stop CCM services at any time (effective at the end of the month) by phone call to the office staff.  Patient agreed to services and verbal consent obtained.   The patient verbalized understanding of instructions, educational materials, and care plan provided today and declined offer to receive copy of patient instructions, educational materials, and care plan.  Telephone follow up appointment with pharmacy team member scheduled for: 11/24/2020   Sherre Poot, PharmD Clinical Pharmacist [place] [phone] (office) [phone] (mobile)  [email or web address]"> [email or web address].[email or web address]"> http://point-of-care.[email or web address].com/skills/"> http://point-of-care.elsevierperformancemanager.com">  Managing Depression, Adult Depression is a mental health condition that affects your thoughts, feelings, and actions. Being diagnosed with depression can bring you relief if you did not know why you have felt or behaved a certain way. It could also leave you feeling overwhelmed with uncertainty about your future. Preparing yourself to manage  your symptoms can help you feel more positive about your future. How to manage lifestyle changes Managing stress Stress is your body's reaction to life changes and events, both good and bad. Stress can add to your feelings of depression. Learning to manage your stress can help lessen your feelings of depression. Try some of the following approaches to reducing your stress (stress reduction techniques):  Listen to music that you enjoy and that inspires you.  Try using a meditation app or take a meditation class.  Develop a practice that helps you connect with your spiritual self. Walk in nature, pray, or go to a place of worship.  Do some deep breathing. To do this, inhale slowly through  your nose. Pause at the top of your inhale for a few seconds and then exhale slowly, letting your muscles relax.  Practice yoga to help relax and work your muscles. Choose a stress reduction technique that suits your lifestyle and personality. These techniques take time and practice to develop. Set aside 5-15 minutes a day to do them. Therapists can offer training in these techniques. Other things you can do to manage stress include:  Keeping a stress diary.  Knowing your limits and saying no when you think something is too much.  Paying attention to how you react to certain situations. You may not be able to control everything, but you can change your reaction.  Adding humor to your life by watching funny films or TV shows.  Making time for activities that you enjoy and that relax you.   Medicines Medicines, such as antidepressants, are often a part of treatment for depression.  Talk with your pharmacist or health care provider about all the medicines, supplements, and herbal products that you take, their possible side effects, and what medicines and other products are safe to take together.  Make sure to report any side effects you may have to your health care provider. Relationships Your health  care provider may suggest family therapy, couples therapy, or individual therapy as part of your treatment. How to recognize changes Everyone responds differently to treatment for depression. As you recover from depression, you may start to:  Have more interest in doing activities.  Feel less hopeless.  Have more energy.  Overeat less often, or have a better appetite.  Have better mental focus. It is important to recognize if your depression is not getting better or is getting worse. The symptoms you had in the beginning may return, such as:  Tiredness (fatigue) or low energy.  Eating too much or too little.  Sleeping too much or too little.  Feeling restless, agitated, or hopeless.  Trouble focusing or making decisions.  Unexplained physical complaints.  Feeling irritable, angry, or aggressive. If you or your family members notice these symptoms coming back, let your health care provider know right away. Follow these instructions at home: Activity  Try to get some form of exercise each day, such as walking, biking, swimming, or lifting weights.  Practice stress reduction techniques.  Engage your mind by taking a class or doing some volunteer work.   Lifestyle  Get the right amount and quality of sleep.  Cut down on using caffeine, tobacco, alcohol, and other potentially harmful substances.  Eat a healthy diet that includes plenty of vegetables, fruits, whole grains, low-fat dairy products, and lean protein. Do not eat a lot of foods that are high in solid fats, added sugars, or salt (sodium). General instructions  Take over-the-counter and prescription medicines only as told by your health care provider.  Keep all follow-up visits as told by your health care provider. This is important. Where to find support Talking to others Friends and family members can be sources of support and guidance. Talk to trusted friends or family members about your condition. Explain  your symptoms to them, and let them know that you are working with a health care provider to treat your depression. Tell friends and family members how they also can be helpful.   Finances  Find appropriate mental health providers that fit with your financial situation.  Talk with your health care provider about options to get reduced prices on your medicines. Where to find more information You  can find support in your area from:  Anxiety and Depression Association of America (ADAA): www.adaa.org  Mental Health America: www.mentalhealthamerica.net  [employer] on Mental Illness: www.nami.org Contact a health care provider if:  You stop taking your antidepressant medicines, and you have any of these symptoms: ? Nausea. ? Headache. ? Light-headedness. ? Chills and body aches. ? Not being able to sleep (insomnia).  You or your friends and family think your depression is getting worse. Get help right away if:  You have thoughts of hurting yourself or others. If you ever feel like you may hurt yourself or others, or have thoughts about taking your own life, get help right away. Go to your nearest emergency department or:  Call your local emergency services (911 in the [place]  Call a suicide crisis helpline, such as the [facility] at (650)561-5152. This is open 24 hours a day in the [place]  Text the Crisis Text Line at 660 495 0171 (in the [place] Summary  If you are diagnosed with depression, preparing yourself to manage your symptoms is a good way to feel positive about your future.  Work with your health care provider on a management plan that includes stress reduction techniques, medicines (if applicable), therapy, and healthy lifestyle habits.  Keep talking with your health care provider about how your treatment is working.  If you have thoughts about taking your own life, call a suicide crisis helpline or text a crisis text line. This information is  not intended to replace advice given to you by your health care provider. Make sure you discuss any questions you have with your health care provider. Document Revised: 01/20/2019 Document Reviewed: 01/20/2019 Elsevier Patient Education  2021 [employer].

## 2020-06-20 ENCOUNTER — Ambulatory Visit (HOSPITAL_BASED_OUTPATIENT_CLINIC_OR_DEPARTMENT_OTHER): Payer: Medicare HMO

## 2020-06-20 ENCOUNTER — Ambulatory Visit (HOSPITAL_COMMUNITY)
Admission: RE | Admit: 2020-06-20 | Discharge: 2020-06-20 | Disposition: A | Discharge status: Home / Self Care | Payer: Medicare HMO | Source: Ambulatory Visit | Attending: Internal Medicine | Admitting: Internal Medicine

## 2020-06-20 ENCOUNTER — Other Ambulatory Visit: Payer: Self-pay

## 2020-06-20 DIAGNOSIS — I6522 Occlusion and stenosis of left carotid artery: Secondary | ICD-10-CM | POA: Insufficient documentation

## 2020-06-20 DIAGNOSIS — R011 Cardiac murmur, unspecified: Secondary | ICD-10-CM | POA: Insufficient documentation

## 2020-06-20 DIAGNOSIS — R0989 Other specified symptoms and signs involving the circulatory and respiratory systems: Secondary | ICD-10-CM | POA: Diagnosis not present

## 2020-06-20 LAB — ECHOCARDIOGRAM COMPLETE
AR max vel: 1.49 cm2
AV Area VTI: 1.54 cm2
AV Area mean vel: 1.61 cm2
AV Mean grad: 10 mmHg
AV Peak grad: 16.2 mmHg
Ao pk vel: 2.01 m/s
Area-P 1/2: 3.08 cm2
S' Lateral: 2.7 cm

## 2020-06-28 ENCOUNTER — Telehealth: Payer: Self-pay

## 2020-06-28 DIAGNOSIS — H401131 Primary open-angle glaucoma, bilateral, mild stage: Secondary | ICD-10-CM | POA: Diagnosis not present

## 2020-06-28 DIAGNOSIS — H353132 Nonexudative age-related macular degeneration, bilateral, intermediate dry stage: Secondary | ICD-10-CM | POA: Diagnosis not present

## 2020-06-28 NOTE — Progress Notes (Signed)
    Chronic Care Management Pharmacy Assistant   Name: [name]  MRN: [id number] DOB: [birth year]  Reason for Encounter: Medication Review for general ahderence  Recent office visits:   06/13/20-[name], CPP-Dr. Marina Goodell approved increasing venlafaxine to 75 mg daily if patient would like.    Recent consult visits:  None  Hospital visits:  10/27/19-ED- elevated troponin  Medications: Outpatient Encounter Medications as of 06/28/2020  Medication Sig  . Acetaminophen (TYLENOL) 325 MG CAPS Take 650 mg by mouth every 6 (six) hours as needed.  . Alirocumab (PRALUENT) 75 MG/ML SOAJ Inject 1 pen into the skin every 14 (fourteen) days.  [name] amLODipine (NORVASC) 10 MG tablet TAKE 1 TABLET BY MOUTH EVERY DAY  . Cholecalciferol 1.25 MG (50000 UT) capsule Take 1 capsule (50,000 Units total) by mouth once a week.  . clopidogrel (PLAVIX) 75 MG tablet TAKE 1 TABLET BY MOUTH EVERY DAY  . furosemide (LASIX) 20 MG tablet TAKE 1 TABLET BY MOUTH EVERY DAY AS NEEDED FOR FLUID (Patient taking differently: Take 20 mg by mouth daily. Taking every day for swelling)  . gabapentin (NEURONTIN) 100 MG capsule Take 1 capsule (100 mg total) by mouth 3 (three) times daily. (Patient not taking: Reported on 06/13/2020)  . latanoprost (XALATAN) 0.005 % ophthalmic solution 1 drop at bedtime.  [name] loratadine (CLARITIN) 10 MG tablet Take 10 mg by mouth daily.  . pantoprazole (PROTONIX) 40 MG tablet TAKE 1 TABLET BY MOUTH TWICE A DAY (Patient taking differently: Take 40 mg by mouth daily. Patient only takes daily)  . valsartan (DIOVAN) 320 MG tablet TAKE 1 TABLET BY MOUTH EVERY DAY (Patient taking differently: Take 320 mg by mouth in the morning.)  . venlafaxine XR (EFFEXOR-XR) 75 MG 24 hr capsule Take 1 capsule (75 mg total) by mouth daily with breakfast.   No facility-administered encounter medications on file as of 06/28/2020.   Spoke to patient she sated she is doing well.  She said she did increase the Venlafaxine, and  there are no reported side effects, she said she is doing great.    Patient stated she is more and more active each day.  She stated she does not add any additional salt to anything and she watches her sugar.  Patient stated she checks her blood pressure once in a while, nothing on a regular basis.  Patient noted no issues with any of her other medications.    [name], CMA Clinical Pharmacist Assistant [phone]

## 2020-07-05 ENCOUNTER — Other Ambulatory Visit: Payer: Self-pay | Admitting: Legal Medicine

## 2020-07-05 DIAGNOSIS — F321 Major depressive disorder, single episode, moderate: Secondary | ICD-10-CM

## 2020-07-21 ENCOUNTER — Other Ambulatory Visit: Payer: Self-pay | Admitting: Legal Medicine

## 2020-07-25 ENCOUNTER — Telehealth: Payer: Self-pay

## 2020-07-25 NOTE — Progress Notes (Signed)
    Chronic Care Management Pharmacy Assistant   Name: [name]  MRN: [id number] DOB: 1941-02-02  Attempted to reach patient 3x.     Reason for Encounter: General Adherence call    Recent office visits:  06/13/2020: [name]. [name], LPN/ annual wellness visit.  Discontinued Venlafaxine XR 37.5mg .Tablet per [name] (PCP) and ordered Venlafaxine 75 mg. Tablet.    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 No recent cerebrovascular symptoms noted.   Hospital visits:  No hospital visits noted since last CPP visit   ALLERGIES: Codeine, Meclizine, Requip, Statins ( no side effects or severity noted)   Medications: Outpatient Encounter Medications as of 07/25/2020  Medication Sig  . Acetaminophen (TYLENOL) 325 MG CAPS Take 650 mg by mouth every 6 (six) hours as needed.  . Alirocumab (PRALUENT) 75 MG/ML SOAJ Inject 1 pen into the skin every 14 (fourteen) days.  [name] amLODipine (NORVASC) 10 MG tablet TAKE 1 TABLET BY MOUTH EVERY DAY  . Cholecalciferol 1.25 MG (50000 UT) capsule Take 1 capsule (50,000 Units total) by mouth once a week.  . clopidogrel (PLAVIX) 75 MG tablet TAKE 1 TABLET BY MOUTH EVERY DAY  . furosemide (LASIX) 20 MG tablet Take 1 tablet (20 mg total) by mouth daily. Taking every day for swelling  . gabapentin (NEURONTIN) 100 MG capsule Take 1 capsule (100 mg total) by mouth 3 (three) times daily. (Patient not taking: Reported on 06/13/2020)  . latanoprost (XALATAN) 0.005 % ophthalmic solution 1 drop at bedtime.  [name] loratadine (CLARITIN) 10 MG tablet Take 10 mg by mouth daily.  . pantoprazole (PROTONIX) 40 MG tablet TAKE 1 TABLET BY MOUTH TWICE A DAY (Patient taking differently: Take 40 mg by mouth daily. Patient only takes daily)  . valsartan (DIOVAN) 320 MG tablet TAKE 1 TABLET BY MOUTH EVERY DAY (Patient taking differently: Take 320 mg by  mouth in the morning.)  . venlafaxine XR (EFFEXOR-XR) 75 MG 24 hr capsule TAKE 1 CAPSULE BY MOUTH DAILY WITH BREAKFAST.   No facility-administered encounter medications on file as of 07/25/2020.   STAR RATING DRUGS:           LFD:               Days supply:  Valsartan   04/26/2020  90DS     Attempted to reach patient 3x. I will plan to follow-up with patient next month if I do not hear from her in the interim.     [name], CMA  [phone] Clinical Pharmacist Assistant

## 2020-08-23 ENCOUNTER — Telehealth: Payer: Self-pay

## 2020-08-23 NOTE — Progress Notes (Signed)
    Chronic Care Management Pharmacy Assistant   Name: [name]  MRN: [id number] DOB: Jul 22, 1940  Reason for Encounter: Disease State for general adherence and Venlofaxine increase    Recent office visits:  06/20/20-Cardiology, left carotid bruit,   06/13/2020: [name]. [name], LPN/ annual wellness visit.  Discontinued Venlafaxine XR 37.5mg .Tablet per [name] (PCP) and ordered Venlafaxine 75 mg. Tablet.   Recent consult visits:  none  Hospital visits:  None in previous 6 months  Medications: Outpatient Encounter Medications as of 08/23/2020  Medication Sig   Acetaminophen (TYLENOL) 325 MG CAPS Take 650 mg by mouth every 6 (six) hours as needed.   Alirocumab (PRALUENT) 75 MG/ML SOAJ Inject 1 pen into the skin every 14 (fourteen) days.   amLODipine (NORVASC) 10 MG tablet TAKE 1 TABLET BY MOUTH EVERY DAY   Cholecalciferol 1.25 MG (50000 UT) capsule Take 1 capsule (50,000 Units total) by mouth once a week.   clopidogrel (PLAVIX) 75 MG tablet TAKE 1 TABLET BY MOUTH EVERY DAY   furosemide (LASIX) 20 MG tablet Take 1 tablet (20 mg total) by mouth daily. Taking every day for swelling   gabapentin (NEURONTIN) 100 MG capsule Take 1 capsule (100 mg total) by mouth 3 (three) times daily. (Patient not taking: Reported on 06/13/2020)   latanoprost (XALATAN) 0.005 % ophthalmic solution 1 drop at bedtime.   loratadine (CLARITIN) 10 MG tablet Take 10 mg by mouth daily.   pantoprazole (PROTONIX) 40 MG tablet TAKE 1 TABLET BY MOUTH TWICE A DAY (Patient taking differently: Take 40 mg by mouth daily. Patient only takes daily)   valsartan (DIOVAN) 320 MG tablet TAKE 1 TABLET BY MOUTH EVERY DAY (Patient taking differently: Take 320 mg by mouth in the morning.)   venlafaxine XR (EFFEXOR-XR) 75 MG 24 hr capsule TAKE 1 CAPSULE BY MOUTH DAILY WITH BREAKFAST.   No facility-administered encounter medications on file as of 08/23/2020.    Unable to reach  Star Rating  Drugs: Clopidogrel  07/22/20 90 Furosemide  07/21/20 90 Valsartan  08/02/20 90 Amlodipine  06/03/20 90  Gaps Dexa-patient declined  AWV: 06/13/20  [name], CMA Clinical Pharmacist Assistant [phone]

## 2020-09-12 ENCOUNTER — Other Ambulatory Visit: Payer: Self-pay

## 2020-09-12 ENCOUNTER — Ambulatory Visit (INDEPENDENT_AMBULATORY_CARE_PROVIDER_SITE_OTHER): Payer: Medicare HMO | Admitting: Legal Medicine

## 2020-09-12 ENCOUNTER — Encounter: Payer: Self-pay | Admitting: Legal Medicine

## 2020-09-12 VITALS — BP 140/80 | HR 60 | Temp 98.4°F | Resp 16 | Ht 60.0 in | Wt 163.0 lb

## 2020-09-12 DIAGNOSIS — I10 Essential (primary) hypertension: Secondary | ICD-10-CM

## 2020-09-12 DIAGNOSIS — F5101 Primary insomnia: Secondary | ICD-10-CM

## 2020-09-12 DIAGNOSIS — E782 Mixed hyperlipidemia: Secondary | ICD-10-CM | POA: Diagnosis not present

## 2020-09-12 DIAGNOSIS — E559 Vitamin D deficiency, unspecified: Secondary | ICD-10-CM | POA: Diagnosis not present

## 2020-09-12 DIAGNOSIS — S92352A Displaced fracture of fifth metatarsal bone, left foot, initial encounter for closed fracture: Secondary | ICD-10-CM

## 2020-09-12 DIAGNOSIS — N1831 Chronic kidney disease, stage 3a: Secondary | ICD-10-CM

## 2020-09-12 DIAGNOSIS — F321 Major depressive disorder, single episode, moderate: Secondary | ICD-10-CM

## 2020-09-12 DIAGNOSIS — I69393 Ataxia following cerebral infarction: Secondary | ICD-10-CM

## 2020-09-12 DIAGNOSIS — R69 Illness, unspecified: Secondary | ICD-10-CM | POA: Diagnosis not present

## 2020-09-12 DIAGNOSIS — Z6832 Body mass index (BMI) 32.0-32.9, adult: Secondary | ICD-10-CM | POA: Diagnosis not present

## 2020-09-12 DIAGNOSIS — I739 Peripheral vascular disease, unspecified: Secondary | ICD-10-CM

## 2020-09-12 NOTE — Progress Notes (Signed)
Established Patient Office Visit  Subjective:  Patient ID: [name], female    DOB: 04/16/1940  Age: 80 y.o. MRN: [id number]  CC:  Chief Complaint  Patient presents with   Hypertension   Hyperlipidemia    HPI [name] presents for chronic  Patient presents for follow up of hypertension.  Patient tolerating valsartan well with side effects.  Patient was diagnosed with hypertension 2010 so has been treated for hypertension for 10 years.Patient is working on maintaining diet and exercise regimen and follows up as directed. Complication include none.   Patient presents with hyperlipidemia.  Compliance with treatment has been good; patient takes medicines as directed, maintains low cholesterol diet, follows up as directed, and maintains exercise regimen.  Patient is using praluent without problems.   Past Medical History:  Diagnosis Date   Benign paroxysmal vertigo, unspecified ear 06/07/2019   CKD (chronic kidney disease), stage III (HCC)    GERD (gastroesophageal reflux disease)    Glaucoma     Past Surgical History:  Procedure Laterality Date   ABDOMINAL HYSTERECTOMY     BACK SURGERY     cataract surgery     COLONOSCOPY N/A 08/03/2015   Procedure: COLONOSCOPY;  Surgeon: Carman Ching, MD;  Location: [facility] ENDOSCOPY;  Service: Endoscopy;  Laterality: N/A;   ESOPHAGOGASTRODUODENOSCOPY N/A 07/31/2015   Procedure: ESOPHAGOGASTRODUODENOSCOPY (EGD);  Surgeon: Carman Ching, MD;  Location: [facility] ENDOSCOPY;  Service: Endoscopy;  Laterality: N/A;   OOPHORECTOMY     TOTAL HIP ARTHROPLASTY     URETER SURGERY      Family History  Problem Relation Age of Onset   Hypertension Mother    Hypertension Father    Heart attack Father    Hypertension Brother    Diabetes Brother     Social History   Socioeconomic History   Marital status: Married    Spouse name: Not on file   Number of children: Not on file   Years of education: Not on file   Highest education level: Not on file   Occupational History   Not on file  Tobacco Use   Smoking status: Never   Smokeless tobacco: Never  Vaping Use   Vaping Use: Never used  Substance and Sexual Activity   Alcohol use: No   Drug use: No   Sexual activity: Not Currently  Other Topics Concern   Not on file  Social History Narrative   Not on file   Social Determinants of Health   Financial Resource Strain: Not on file  Food Insecurity: No Food Insecurity   Worried About Running Out of Food in the Last Year: Never true   Ran Out of Food in the Last Year: Never true  Transportation Needs: No Transportation Needs   Lack of Transportation (Medical): No   Lack of Transportation (Non-Medical): No  Physical Activity: Not on file  Stress: Not on file  Social Connections: Not on file  Intimate Partner Violence: Not on file    Outpatient Medications Prior to Visit  Medication Sig Dispense Refill   Acetaminophen (TYLENOL) 325 MG CAPS Take 650 mg by mouth every 6 (six) hours as needed.     Alirocumab (PRALUENT) 75 MG/ML SOAJ Inject 1 pen into the skin every 14 (fourteen) days. 6 mL 3   amLODipine (NORVASC) 10 MG tablet TAKE 1 TABLET BY MOUTH EVERY DAY 90 tablet 2   Cholecalciferol 1.25 MG (50000 UT) capsule Take 1 capsule (50,000 Units total) by mouth once a week. 12  capsule 3   clopidogrel (PLAVIX) 75 MG tablet TAKE 1 TABLET BY MOUTH EVERY DAY 90 tablet 2   furosemide (LASIX) 20 MG tablet Take 1 tablet (20 mg total) by mouth daily. Taking every day for swelling 90 tablet 2   gabapentin (NEURONTIN) 100 MG capsule Take 1 capsule (100 mg total) by mouth 3 (three) times daily. 90 capsule 3   latanoprost (XALATAN) 0.005 % ophthalmic solution 1 drop at bedtime.     loratadine (CLARITIN) 10 MG tablet Take 10 mg by mouth daily.     pantoprazole (PROTONIX) 40 MG tablet TAKE 1 TABLET BY MOUTH TWICE A DAY (Patient taking differently: Take 40 mg by mouth daily. Patient only takes daily) 180 tablet 2   valsartan (DIOVAN) 320 MG tablet  TAKE 1 TABLET BY MOUTH EVERY DAY (Patient taking differently: Take 320 mg by mouth in the morning.) 90 tablet 2   venlafaxine XR (EFFEXOR-XR) 75 MG 24 hr capsule TAKE 1 CAPSULE BY MOUTH DAILY WITH BREAKFAST. 90 capsule 2   No facility-administered medications prior to visit.    Allergies  Allergen Reactions   Codeine Nausea And Vomiting    Nausea    Meclizine Other (See Comments)    Lethargy, extreme sleepiness   Requip [Ropinirole Hcl] Nausea And Vomiting   Statins     Muscle weakness    ROS Review of Systems  Constitutional:  Negative for activity change and appetite change.  HENT:  Negative for congestion.   Eyes:  Negative for visual disturbance.  Respiratory:  Negative for cough and chest tightness.   Cardiovascular:  Negative for chest pain, palpitations and leg swelling.  Gastrointestinal:  Negative for abdominal distention and abdominal pain.  Endocrine: Negative for polyuria.  Genitourinary:  Negative for difficulty urinating and dysuria.  Musculoskeletal:  Negative for arthralgias and back pain.  Skin: Negative.   Neurological: Negative.   Psychiatric/Behavioral: Negative.       Objective:    Physical Exam Vitals reviewed.  Constitutional:      Appearance: Normal appearance.  HENT:     Head: Normocephalic.     Right Ear: Tympanic membrane, ear canal and external ear normal.     Left Ear: Tympanic membrane, ear canal and external ear normal.     Mouth/Throat:     Mouth: Mucous membranes are moist.     Pharynx: Oropharynx is clear.  Eyes:     Extraocular Movements: Extraocular movements intact.     Conjunctiva/sclera: Conjunctivae normal.     Pupils: Pupils are equal, round, and reactive to light.  Cardiovascular:     Rate and Rhythm: Normal rate and regular rhythm.     Pulses: Normal pulses.     Heart sounds: Normal heart sounds. No murmur heard.   No gallop.  Pulmonary:     Effort: Pulmonary effort is normal. No respiratory distress.     Breath  sounds: Normal breath sounds. No wheezing.  Abdominal:     General: Abdomen is flat. Bowel sounds are normal. There is no distension.     Palpations: Abdomen is soft.     Tenderness: There is no abdominal tenderness.  Musculoskeletal:        General: Normal range of motion.     Cervical back: Normal range of motion and neck supple.  Skin:    Capillary Refill: Capillary refill takes less than 2 seconds.     Findings: Bruising (lft foot) present.  Neurological:     General: No focal deficit present.  Mental Status: She is alert and oriented to person, place, and time. Mental status is at baseline.  Psychiatric:        Mood and Affect: Mood normal.        Thought Content: Thought content normal.   Depression screen [facility] 2/9 09/12/2020 06/13/2020 06/13/2020 05/15/2020 01/12/2020  Decreased Interest 1 0 2 1 0  Down, Depressed, Hopeless 1 0 2 1 0  PHQ - 2 Score 2 0 4 2 0  Altered sleeping 3 3 0 3 0  Tired, decreased energy 3 3 3 3  0  Change in appetite 0 0 0 2 0  Feeling bad or failure about yourself  0 0 0 0 0  Trouble concentrating 0 0 0 0 0  Moving slowly or fidgety/restless 0 0 0 1 0  Suicidal thoughts 0 0 0 0 0  PHQ-9 Score 8 6 7 11  0  Difficult doing work/chores Not difficult at all Not difficult at all Somewhat difficult Somewhat difficult -  Some recent data might be hidden    BP 140/80   Pulse 60   Temp 98.4 F (36.9 C)   Resp 16   Ht 5' (1.524 m)   Wt 163 lb (73.9 kg)   SpO2 98%   BMI 31.83 kg/m  Wt Readings from Last 3 Encounters:  09/12/20 163 lb (73.9 kg)  06/13/20 162 lb 9.6 oz (73.8 kg)  05/15/20 166 lb (75.3 kg)     Health Maintenance Due  Topic Date Due   TETANUS/TDAP  Never done   COVID-19 Vaccine (3 - Booster for Pfizer series) 12/07/2019    There are no preventive care reminders to display for this patient.  Lab Results  Component Value Date   TSH 2.550 05/15/2020   Lab Results  Component Value Date   WBC 6.4 05/15/2020   HGB 12.6  05/15/2020   HCT 38.3 05/15/2020   MCV 94 05/15/2020   PLT 359 05/15/2020   Lab Results  Component Value Date   NA 140 05/15/2020   K 4.1 05/15/2020   CO2 20 05/15/2020   GLUCOSE 104 (H) 05/15/2020   BUN 15 05/15/2020   CREATININE 1.04 (H) 05/15/2020   BILITOT 0.3 05/15/2020   ALKPHOS 143 (H) 05/15/2020   AST 14 05/15/2020   ALT 9 05/15/2020   PROT 7.0 05/15/2020   ALBUMIN 4.0 05/15/2020   CALCIUM 9.7 05/15/2020   ANIONGAP 6 08/03/2015   Lab Results  Component Value Date   CHOL 177 05/15/2020   Lab Results  Component Value Date   HDL 41 05/15/2020   Lab Results  Component Value Date   LDLCALC 105 (H) 05/15/2020   Lab Results  Component Value Date   TRIG 180 (H) 05/15/2020   Lab Results  Component Value Date   CHOLHDL 4.3 05/15/2020   Lab Results  Component Value Date   HGBA1C 5.6 08/01/2015      Assessment & Plan:   Problem List Items Addressed This Visit       Cardiovascular and Mediastinum   Essential hypertension - Primary   Relevant Orders   Comprehensive metabolic panel   CBC with Differential/Platelet An individual hypertension care plan was established and reinforced today.  The patient's status was assessed using clinical findings on exam and labs or diagnostic tests. The patient's success at meeting treatment goals on disease specific evidence-based guidelines and found to be well controlled. SELF MANAGEMENT: The patient and I together assessed ways to personally work towards obtaining the  recommended goals. RECOMMENDATIONS: avoid decongestants found in common cold remedies, decrease consumption of alcohol, perform routine monitoring of BP with home BP cuff, exercise, reduction of dietary salt, take medicines as prescribed, try not to miss doses and quit smoking.  Regular exercise and maintaining a healthy weight is needed.  Stress reduction may help. A CLINICAL SUMMARY including written plan identify barriers to care unique to individual due to  social or financial issues.  We attempt to mutually creat solutions for individual and family understanding.     PVD (peripheral vascular disease) (HCC) AN INDIVIDUAL CARE PLAN for PVD was established and reinforced today.  The patient's status was assessed using clinical findings on exam, labs, and other diagnostic testing. Patient's success at meeting treatment goals based on disease specific evidence-bassed guidelines and found to be in fair control. RECOMMENDATIONS include maintaining present medicines and treatment.      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  The patient's status was assessed using clinical findings on exam, labs, and other diagnostic testing. Patient's success at meeting treatment goals based on disease specific evidence-bassed guidelines and found to be in fair control. RECOMMENDATIONS include maintaining present medicines and treatment.      Other   Mixed hyperlipidemia   Relevant Orders   Lipid panel AN INDIVIDUAL CARE PLAN for hyperlipidemia/ cholesterol was established and reinforced today.  The patient's status was assessed using clinical findings on exam, lab and other diagnostic tests. The patient's disease status was assessed based on evidence-based guidelines and found to be fair controlled. MEDICATIONS were reviewed. SELF MANAGEMENT GOALS have been discussed and patient's success at attaining the goal of low cholesterol was assessed. RECOMMENDATION given include regular exercise 3 days a week and low cholesterol/low fat diet. CLINICAL SUMMARY including written plan to identify barriers unique to the patient due to social or economic  reasons was discussed.     Primary insomnia AN INDIVIDUAL CARE PLAN for insomnia was established and reinforced today.  The  patient's status was assessed using clinical findings on exam, labs, and other diagnostic testing. Patient's success at meeting treatment goals based on disease specific evidence-bassed guidelines and found to be in fair control. RECOMMENDATIONS include maintaining present medicines and treatment.     BMI 32.0-32.9,adult An individualize plan was formulated for obesity using patient history and physical exam to encourage weight loss.  An evidence based program was formulated.  Patient is to cut portion size with meals and to plan physical exercise 3 days a week at least 20 minutes.  Weight watchers and other programs are helpful.  Planned amount of weight loss 10 lbs.    Other Visit Diagnoses     Depression, major, single episode, moderate (HCC)       Vitamin D deficiency       Relevant Orders   VITAMIN D 25 Hydroxy (Vit-D Deficiency, Fractures) Patient's depression is controlled with venlafacine.   Anhedonia better.  PHQ 9 was performed score 8. An individual care plan was established or reinforced today.  The patient's disease status was assessed using clinical findings on exam, labs, and or other diagnostic testing to determine patient's success in meeting treatment goals based on disease specific evidence-based guidelines and found to be improving Recommendations include stay on medicines     Closed fracture  of base of fifth metatarsal bone of left foot, initial encounter       Relevant Orders   DG Foot Complete Left X-ray negative for fracture          Follow-up: Return in about 6 months (around 03/14/2021) for fasting.    Brent BullaLawrence Leilan Bochenek, MD

## 2020-09-13 LAB — CBC WITH DIFFERENTIAL/PLATELET
Basophils Absolute: 0 10*3/uL (ref 0.0–0.2)
Basos: 0 %
EOS (ABSOLUTE): 0.1 10*3/uL (ref 0.0–0.4)
Eos: 2 %
Hematocrit: 39.1 % (ref 34.0–46.6)
Hemoglobin: 12.5 g/dL (ref 11.1–15.9)
Immature Grans (Abs): 0 10*3/uL (ref 0.0–0.1)
Immature Granulocytes: 0 %
Lymphocytes Absolute: 1.6 10*3/uL (ref 0.7–3.1)
Lymphs: 26 %
MCH: 30.2 pg (ref 26.6–33.0)
MCHC: 32 g/dL (ref 31.5–35.7)
MCV: 94 fL (ref 79–97)
Monocytes Absolute: 0.7 10*3/uL (ref 0.1–0.9)
Monocytes: 11 %
Neutrophils Absolute: 3.7 10*3/uL (ref 1.4–7.0)
Neutrophils: 61 %
Platelets: 346 10*3/uL (ref 150–450)
RBC: 4.14 x10E6/uL (ref 3.77–5.28)
RDW: 12.9 % (ref 11.7–15.4)
WBC: 6.1 10*3/uL (ref 3.4–10.8)

## 2020-09-13 LAB — LIPID PANEL
Chol/HDL Ratio: 4.8 ratio — ABNORMAL HIGH (ref 0.0–4.4)
Cholesterol, Total: 192 mg/dL (ref 100–199)
HDL: 40 mg/dL (ref >39)
LDL Chol Calc (NIH): 109 mg/dL — ABNORMAL HIGH (ref 0–99)
Triglycerides: 252 mg/dL — ABNORMAL HIGH (ref 0–149)
VLDL Cholesterol Cal: 43 mg/dL — ABNORMAL HIGH (ref 5–40)

## 2020-09-13 LAB — COMPREHENSIVE METABOLIC PANEL
ALT: 1 IU/L (ref 0–32)
AST: 12 IU/L (ref 0–40)
Albumin/Globulin Ratio: 1.6 (ref 1.2–2.2)
Albumin: 4.1 g/dL (ref 3.7–4.7)
Alkaline Phosphatase: 128 IU/L — ABNORMAL HIGH (ref 44–121)
BUN/Creatinine Ratio: 16 (ref 12–28)
BUN: 16 mg/dL (ref 8–27)
Bilirubin Total: 0.3 mg/dL (ref 0.0–1.2)
CO2: 23 mmol/L (ref 20–29)
Calcium: 10.1 mg/dL (ref 8.7–10.3)
Chloride: 106 mmol/L (ref 96–106)
Creatinine, Ser: 1.02 mg/dL — ABNORMAL HIGH (ref 0.57–1.00)
Globulin, Total: 2.6 g/dL (ref 1.5–4.5)
Glucose: 101 mg/dL — ABNORMAL HIGH (ref 65–99)
Potassium: 4.7 mmol/L (ref 3.5–5.2)
Sodium: 140 mmol/L (ref 134–144)
Total Protein: 6.7 g/dL (ref 6.0–8.5)
eGFR: 56 mL/min/{1.73_m2} — ABNORMAL LOW (ref >59)

## 2020-09-13 LAB — VITAMIN D 25 HYDROXY (VIT D DEFICIENCY, FRACTURES): Vit D, 25-Hydroxy: 31.6 ng/mL (ref 30.0–100.0)

## 2020-09-13 LAB — CARDIOVASCULAR RISK ASSESSMENT

## 2020-09-13 NOTE — Progress Notes (Signed)
Glucose 101, kidneys stage 3a, liver tests normal, 252 triglycerides high and LDL cholesterol high at 109, CBC normal, Vitamin  D low 5.3 take supplements lp

## 2020-09-15 DIAGNOSIS — R69 Illness, unspecified: Secondary | ICD-10-CM | POA: Diagnosis not present

## 2020-09-15 DIAGNOSIS — H3530 Unspecified macular degeneration: Secondary | ICD-10-CM | POA: Diagnosis not present

## 2020-09-15 DIAGNOSIS — I10 Essential (primary) hypertension: Secondary | ICD-10-CM | POA: Diagnosis not present

## 2020-09-15 DIAGNOSIS — E669 Obesity, unspecified: Secondary | ICD-10-CM | POA: Diagnosis not present

## 2020-09-15 DIAGNOSIS — H547 Unspecified visual loss: Secondary | ICD-10-CM | POA: Diagnosis not present

## 2020-09-15 DIAGNOSIS — I951 Orthostatic hypotension: Secondary | ICD-10-CM | POA: Diagnosis not present

## 2020-09-15 DIAGNOSIS — G8220 Paraplegia, unspecified: Secondary | ICD-10-CM | POA: Diagnosis not present

## 2020-09-15 DIAGNOSIS — H409 Unspecified glaucoma: Secondary | ICD-10-CM | POA: Diagnosis not present

## 2020-09-15 DIAGNOSIS — I69365 Other paralytic syndrome following cerebral infarction, bilateral: Secondary | ICD-10-CM | POA: Diagnosis not present

## 2020-09-21 NOTE — Progress Notes (Addendum)
[name], CPP, stated the patient had left her a message.  I contacted the patient , she is doing ok with the Venlafaxine increase, she said she is not really better but is not worse than she was.  She denied any other medication issues.  She stated she rarely checks her blood sugars.  She said that she has talked with [facility] about getting the March charges filed with her insurance Administrator), she said [facility] told her that our office would need to call and fix the issue of the bill she received for $500.00.  I told her I would let [name] CPP know.  [name], CMA Clinical Pharmacist Assistant [phone]

## 2020-10-22 ENCOUNTER — Other Ambulatory Visit: Payer: Self-pay | Admitting: Legal Medicine

## 2020-10-23 ENCOUNTER — Other Ambulatory Visit: Payer: Self-pay | Admitting: Legal Medicine

## 2020-10-23 DIAGNOSIS — I10 Essential (primary) hypertension: Secondary | ICD-10-CM

## 2020-11-15 DIAGNOSIS — H353132 Nonexudative age-related macular degeneration, bilateral, intermediate dry stage: Secondary | ICD-10-CM | POA: Diagnosis not present

## 2020-11-24 ENCOUNTER — Telehealth: Payer: Medicare HMO

## 2020-12-08 DIAGNOSIS — Z043 Encounter for examination and observation following other accident: Secondary | ICD-10-CM | POA: Diagnosis not present

## 2020-12-08 DIAGNOSIS — I509 Heart failure, unspecified: Secondary | ICD-10-CM | POA: Diagnosis not present

## 2020-12-08 DIAGNOSIS — M19071 Primary osteoarthritis, right ankle and foot: Secondary | ICD-10-CM | POA: Diagnosis not present

## 2020-12-08 DIAGNOSIS — S99921A Unspecified injury of right foot, initial encounter: Secondary | ICD-10-CM | POA: Diagnosis not present

## 2020-12-08 DIAGNOSIS — S0081XA Abrasion of other part of head, initial encounter: Secondary | ICD-10-CM | POA: Diagnosis not present

## 2020-12-08 DIAGNOSIS — S81012A Laceration without foreign body, left knee, initial encounter: Secondary | ICD-10-CM | POA: Diagnosis not present

## 2020-12-08 DIAGNOSIS — Z885 Allergy status to narcotic agent status: Secondary | ICD-10-CM | POA: Diagnosis not present

## 2020-12-08 DIAGNOSIS — Z23 Encounter for immunization: Secondary | ICD-10-CM | POA: Diagnosis not present

## 2020-12-08 DIAGNOSIS — Z96642 Presence of left artificial hip joint: Secondary | ICD-10-CM | POA: Diagnosis not present

## 2020-12-08 DIAGNOSIS — M1712 Unilateral primary osteoarthritis, left knee: Secondary | ICD-10-CM | POA: Diagnosis not present

## 2020-12-08 DIAGNOSIS — M7989 Other specified soft tissue disorders: Secondary | ICD-10-CM | POA: Diagnosis not present

## 2020-12-08 DIAGNOSIS — W101XXA Fall (on)(from) sidewalk curb, initial encounter: Secondary | ICD-10-CM | POA: Diagnosis not present

## 2020-12-08 DIAGNOSIS — Z8673 Personal history of transient ischemic attack (TIA), and cerebral infarction without residual deficits: Secondary | ICD-10-CM | POA: Diagnosis not present

## 2020-12-08 DIAGNOSIS — Z90710 Acquired absence of both cervix and uterus: Secondary | ICD-10-CM | POA: Diagnosis not present

## 2020-12-13 ENCOUNTER — Other Ambulatory Visit: Payer: Self-pay | Admitting: Legal Medicine

## 2020-12-13 ENCOUNTER — Other Ambulatory Visit: Payer: Self-pay | Admitting: Internal Medicine

## 2020-12-13 DIAGNOSIS — I10 Essential (primary) hypertension: Secondary | ICD-10-CM

## 2020-12-13 NOTE — Telephone Encounter (Signed)
Refill sent to pharmacy.   

## 2020-12-18 ENCOUNTER — Ambulatory Visit (INDEPENDENT_AMBULATORY_CARE_PROVIDER_SITE_OTHER): Payer: Medicare HMO | Admitting: Legal Medicine

## 2020-12-18 ENCOUNTER — Encounter: Payer: Self-pay | Admitting: Legal Medicine

## 2020-12-18 ENCOUNTER — Other Ambulatory Visit: Payer: Self-pay

## 2020-12-18 VITALS — BP 132/74 | HR 99 | Temp 97.5°F | Ht 60.0 in | Wt 161.6 lb

## 2020-12-18 DIAGNOSIS — M79671 Pain in right foot: Secondary | ICD-10-CM | POA: Diagnosis not present

## 2020-12-18 DIAGNOSIS — T148XXA Other injury of unspecified body region, initial encounter: Secondary | ICD-10-CM

## 2020-12-18 DIAGNOSIS — L089 Local infection of the skin and subcutaneous tissue, unspecified: Secondary | ICD-10-CM

## 2020-12-18 MED ORDER — SULFAMETHOXAZOLE-TRIMETHOPRIM 800-160 MG PO TABS: 1.0000 | ORAL_TABLET | Freq: Two times a day (BID) | ORAL | 0 refills | Status: DC

## 2020-12-18 NOTE — Progress Notes (Signed)
Acute Office Visit  Subjective:    Patient ID: [name], female    DOB: 1940/05/25, 80 y.o.   MRN: [id number]  Chief Complaint  Patient presents with   Laceration    Left knee with stitches, has had them for 10 days today, right foot fractured.     HPI Patient is in today for lacerations She fell 10 days ago onto concrete and left knee is red and swollen. The ankles are swollen 2+. She has ecchymosis on 2nd toe right foot. Can wlk. Past Medical History:  Diagnosis Date   Benign paroxysmal vertigo, unspecified ear 06/07/2019   CKD (chronic kidney disease), stage III (HCC)    GERD (gastroesophageal reflux disease)    Glaucoma     Past Surgical History:  Procedure Laterality Date   ABDOMINAL HYSTERECTOMY     BACK SURGERY     cataract surgery     COLONOSCOPY N/A 08/03/2015   Procedure: COLONOSCOPY;  Surgeon: Laurence Spates, MD;  Location: [place];  Service: Endoscopy;  Laterality: N/A;   ESOPHAGOGASTRODUODENOSCOPY N/A 07/31/2015   Procedure: ESOPHAGOGASTRODUODENOSCOPY (EGD);  Surgeon: Laurence Spates, MD;  Location: [facility] ENDOSCOPY;  Service: Endoscopy;  Laterality: N/A;   OOPHORECTOMY     TOTAL HIP ARTHROPLASTY     URETER SURGERY      Family History  Problem Relation Age of Onset   Hypertension Mother    Hypertension Father    Heart attack Father    Hypertension Brother    Diabetes Brother     Social History   Socioeconomic History   Marital status: Married    Spouse name: Not on file   Number of children: Not on file   Years of education: Not on file   Highest education level: Not on file  Occupational History   Not on file  Tobacco Use   Smoking status: Never   Smokeless tobacco: Never  Vaping Use   Vaping Use: Never used  Substance and Sexual Activity   Alcohol use: No   Drug use: No   Sexual activity: Not Currently  Other Topics Concern   Not on file  Social History Narrative   Not on file   Social Determinants of Health   Financial Resource  Strain: Not on file  Food Insecurity: No Food Insecurity   Worried About Running Out of Food in the Last Year: Never true   [place] in the Last Year: Never true  Transportation Needs: No Transportation Needs   Lack of Transportation (Medical): No   Lack of Transportation (Non-Medical): No  Physical Activity: Not on file  Stress: Not on file  Social Connections: Not on file  Intimate Partner Violence: Not on file    Outpatient Medications Prior to Visit  Medication Sig Dispense Refill   Acetaminophen (TYLENOL) 325 MG CAPS Take 650 mg by mouth every 6 (six) hours as needed.     Alirocumab (PRALUENT) 75 MG/ML SOAJ INJECT 1 PEN INTO THE SKIN EVERY 14 (FOURTEEN) DAYS. 2 mL 11   amLODipine (NORVASC) 10 MG tablet TAKE 1 TABLET BY MOUTH EVERY DAY 90 tablet 2   Cholecalciferol 1.25 MG (50000 UT) capsule Take 1 capsule (50,000 Units total) by mouth once a week. 12 capsule 3   clopidogrel (PLAVIX) 75 MG tablet TAKE 1 TABLET BY MOUTH EVERY DAY 90 tablet 2   furosemide (LASIX) 20 MG tablet Take 1 tablet (20 mg total) by mouth daily. Taking every day for swelling 90 tablet 2  gabapentin (NEURONTIN) 100 MG capsule Take 1 capsule (100 mg total) by mouth 3 (three) times daily. 90 capsule 3   latanoprost (XALATAN) 0.005 % ophthalmic solution 1 drop at bedtime.     loratadine (CLARITIN) 10 MG tablet Take 10 mg by mouth daily.     pantoprazole (PROTONIX) 40 MG tablet TAKE 1 TABLET BY MOUTH TWICE A DAY 180 tablet 2   valsartan (DIOVAN) 320 MG tablet TAKE 1 TABLET BY MOUTH EVERY DAY 90 tablet 2   venlafaxine XR (EFFEXOR-XR) 75 MG 24 hr capsule TAKE 1 CAPSULE BY MOUTH DAILY WITH BREAKFAST. 90 capsule 2   No facility-administered medications prior to visit.    Allergies  Allergen Reactions   Codeine Nausea And Vomiting    Nausea    Meclizine Other (See Comments)    Lethargy, extreme sleepiness   Requip [Ropinirole Hcl] Nausea And Vomiting   Statins     Muscle weakness    Review of Systems   Constitutional:  Negative for activity change and appetite change.  HENT:  Negative for congestion.   Eyes:  Negative for visual disturbance.  Respiratory:  Negative for chest tightness and shortness of breath.   Cardiovascular:  Negative for chest pain, palpitations and leg swelling.  Gastrointestinal:  Negative for abdominal distention and abdominal pain.  Genitourinary:  Negative for difficulty urinating and dysuria.  Musculoskeletal:  Negative for arthralgias.       7 cm laceration left knee  Skin:  Positive for wound (red).  Psychiatric/Behavioral: Negative.        Objective:    Physical Exam  BP 132/74   Pulse 99   Temp (!) 97.5 F (36.4 C)   Ht 5' (1.524 m)   Wt 161 lb 9.6 oz (73.3 kg)   SpO2 95%   BMI 31.56 kg/m  Wt Readings from Last 3 Encounters:  12/18/20 161 lb 9.6 oz (73.3 kg)  09/12/20 163 lb (73.9 kg)  06/13/20 162 lb 9.6 oz (73.8 kg)    Health Maintenance Due  Topic Date Due   Zoster Vaccines- Shingrix (1 of 2) Never done   COVID-19 Vaccine (3 - Booster for Pfizer series) 12/07/2019   INFLUENZA VACCINE  Never done    There are no preventive care reminders to display for this patient.   Lab Results  Component Value Date   TSH 2.550 05/15/2020   Lab Results  Component Value Date   WBC 6.1 09/12/2020   HGB 12.5 09/12/2020   HCT 39.1 09/12/2020   MCV 94 09/12/2020   PLT 346 09/12/2020   Lab Results  Component Value Date   NA 140 09/12/2020   K 4.7 09/12/2020   CO2 23 09/12/2020   GLUCOSE 101 (H) 09/12/2020   BUN 16 09/12/2020   CREATININE 1.02 (H) 09/12/2020   BILITOT 0.3 09/12/2020   ALKPHOS 128 (H) 09/12/2020   AST 12 09/12/2020   ALT 1 09/12/2020   PROT 6.7 09/12/2020   ALBUMIN 4.1 09/12/2020   CALCIUM 10.1 09/12/2020   ANIONGAP 6 08/03/2015   EGFR 56 (L) 09/12/2020   Lab Results  Component Value Date   CHOL 192 09/12/2020   Lab Results  Component Value Date   HDL 40 09/12/2020   Lab Results  Component Value Date    LDLCALC 109 (H) 09/12/2020   Lab Results  Component Value Date   TRIG 252 (H) 09/12/2020   Lab Results  Component Value Date   CHOLHDL 4.8 (H) 09/12/2020   Lab Results  Component Value Date   HGBA1C 5.6 08/01/2015       Assessment & Plan:  Diagnoses and all orders for this visit: Infected laceration -     sulfamethoxazole-trimethoprim (BACTRIM DS) 800-160 MG tablet; Take 1 tablet by mouth 2 (two) times daily. Laceration left knee with infection, the wound is pulling apart and there is effusion.  Dress knee and keep sutures in for one more week Foot pain, right  Pain in right foot with ecchymosis , I discern no evidence of fracture and x-ray at ER was negative       I spent 20 minutes dedicated to the care of this patient on the date of this encounter to include face-to-face time with the patient, as well as:  Follow-up: Return in about 1 week (around 12/25/2020) for laceration, marla to remove stiches.  An After Visit Summary was printed and given to the patient.  Reinaldo Meeker, MD [facility] [phone]

## 2020-12-25 ENCOUNTER — Ambulatory Visit (INDEPENDENT_AMBULATORY_CARE_PROVIDER_SITE_OTHER): Payer: Medicare HMO | Admitting: Family Medicine

## 2020-12-25 ENCOUNTER — Other Ambulatory Visit: Payer: Self-pay

## 2020-12-25 ENCOUNTER — Encounter: Payer: Self-pay | Admitting: Family Medicine

## 2020-12-25 DIAGNOSIS — T8149XA Infection following a procedure, other surgical site, initial encounter: Secondary | ICD-10-CM

## 2020-12-25 DIAGNOSIS — Z4802 Encounter for removal of sutures: Secondary | ICD-10-CM

## 2020-12-25 HISTORY — DX: Infection following a procedure, other surgical site, initial encounter: T81.49XA

## 2020-12-25 MED ORDER — CIPROFLOXACIN HCL 250 MG PO TABS: 250.0000 mg | ORAL_TABLET | Freq: Two times a day (BID) | ORAL | 0 refills | Status: AC

## 2020-12-25 NOTE — Progress Notes (Signed)
Acute Office Visit  Subjective:    Patient ID: [name], female    DOB: 17-Jan-1941, 80 y.o.   MRN: [id number]  Chief Complaint  Patient presents with   Suture / Staple Removal     HPI Patient is in today for suture removal from left knee.  The patient had a fall and was seen in the emergency department and has a 3 inch horizontal sutured incision.  Family is concerned because there is some increased erythema around it.  The patient was given Bactrim by Dr. Henrene Pastor last week for possible incisional infection.  Has completed antibiotics (has 1 day left.)  Past Medical History:  Diagnosis Date   Benign paroxysmal vertigo, unspecified ear 06/07/2019   CKD (chronic kidney disease), stage III (HCC)    GERD (gastroesophageal reflux disease)    Glaucoma     Past Surgical History:  Procedure Laterality Date   ABDOMINAL HYSTERECTOMY     BACK SURGERY     cataract surgery     COLONOSCOPY N/A 08/03/2015   Procedure: COLONOSCOPY;  Surgeon: Laurence Spates, MD;  Location: [place];  Service: Endoscopy;  Laterality: N/A;   ESOPHAGOGASTRODUODENOSCOPY N/A 07/31/2015   Procedure: ESOPHAGOGASTRODUODENOSCOPY (EGD);  Surgeon: Laurence Spates, MD;  Location[facility] ENDOSCOPY;  Service: Endoscopy;  Laterality: N/A;   OOPHORECTOMY     TOTAL HIP ARTHROPLASTY     URETER SURGERY      Family History  Problem Relation Age of Onset   Hypertension Mother    Hypertension Father    Heart attack Father    Hypertension Brother    Diabetes Brother     Social History   Socioeconomic History   Marital status: Married    Spouse name: Not on file   Number of children: Not on file   Years of education: Not on file   Highest education level: Not on file  Occupational History   Not on file  Tobacco Use   Smoking status: Never   Smokeless tobacco: Never  Vaping Use   Vaping Use: Never used  Substance and Sexual Activity   Alcohol use: No   Drug use: No   Sexual activity: Not Currently  Other Topics  Concern   Not on file  Social History Narrative   Not on file   Social Determinants of Health   Financial Resource Strain: Not on file  Food Insecurity: No Food Insecurity   Worried About Running Out of Food in the Last Year: Never true   [place] in the Last Year: Never true  Transportation Needs: No Transportation Needs   Lack of Transportation (Medical): No   Lack of Transportation (Non-Medical): No  Physical Activity: Not on file  Stress: Not on file  Social Connections: Not on file  Intimate Partner Violence: Not on file    Outpatient Medications Prior to Visit  Medication Sig Dispense Refill   Acetaminophen (TYLENOL) 325 MG CAPS Take 650 mg by mouth every 6 (six) hours as needed.     Alirocumab (PRALUENT) 75 MG/ML SOAJ INJECT 1 PEN INTO THE SKIN EVERY 14 (FOURTEEN) DAYS. 2 mL 11   amLODipine (NORVASC) 10 MG tablet TAKE 1 TABLET BY MOUTH EVERY DAY 90 tablet 2   Cholecalciferol 1.25 MG (50000 UT) capsule Take 1 capsule (50,000 Units total) by mouth once a week. 12 capsule 3   clopidogrel (PLAVIX) 75 MG tablet TAKE 1 TABLET BY MOUTH EVERY DAY 90 tablet 2   furosemide (LASIX) 20 MG tablet  Take 1 tablet (20 mg total) by mouth daily. Taking every day for swelling 90 tablet 2   gabapentin (NEURONTIN) 100 MG capsule Take 1 capsule (100 mg total) by mouth 3 (three) times daily. 90 capsule 3   latanoprost (XALATAN) 0.005 % ophthalmic solution 1 drop at bedtime.     loratadine (CLARITIN) 10 MG tablet Take 10 mg by mouth daily.     pantoprazole (PROTONIX) 40 MG tablet TAKE 1 TABLET BY MOUTH TWICE A DAY 180 tablet 2   valsartan (DIOVAN) 320 MG tablet TAKE 1 TABLET BY MOUTH EVERY DAY 90 tablet 2   venlafaxine XR (EFFEXOR-XR) 75 MG 24 hr capsule TAKE 1 CAPSULE BY MOUTH DAILY WITH BREAKFAST. 90 capsule 2   sulfamethoxazole-trimethoprim (BACTRIM DS) 800-160 MG tablet Take 1 tablet by mouth 2 (two) times daily. 20 tablet 0   No facility-administered medications prior to visit.     Allergies  Allergen Reactions   Codeine Nausea And Vomiting    Nausea    Meclizine Other (See Comments)    Lethargy, extreme sleepiness   Requip [Ropinirole Hcl] Nausea And Vomiting   Statins     Muscle weakness    Review of Systems  Constitutional:  Negative for chills and fever.  Musculoskeletal:  Negative for arthralgias (Denies knee pain.  Does have some stiffness.).      Objective:    Physical Exam Constitutional:      Appearance: Normal appearance.  Skin:    Comments: Incision-clean, intact, no drainage.  Some erythema.  Sutures 8 removed.  Neurological:     Mental Status: She is alert.    There were no vitals taken for this visit. Wt Readings from Last 3 Encounters:  12/18/20 161 lb 9.6 oz (73.3 kg)  09/12/20 163 lb (73.9 kg)  06/13/20 162 lb 9.6 oz (73.8 kg)    Health Maintenance Due  Topic Date Due   Zoster Vaccines- Shingrix (1 of 2) Never done   COVID-19 Vaccine (3 - Booster for Pfizer series) 12/07/2019   INFLUENZA VACCINE  Never done    There are no preventive care reminders to display for this patient.   Lab Results  Component Value Date   TSH 2.550 05/15/2020   Lab Results  Component Value Date   WBC 6.1 09/12/2020   HGB 12.5 09/12/2020   HCT 39.1 09/12/2020   MCV 94 09/12/2020   PLT 346 09/12/2020   Lab Results  Component Value Date   NA 140 09/12/2020   K 4.7 09/12/2020   CO2 23 09/12/2020   GLUCOSE 101 (H) 09/12/2020   BUN 16 09/12/2020   CREATININE 1.02 (H) 09/12/2020   BILITOT 0.3 09/12/2020   ALKPHOS 128 (H) 09/12/2020   AST 12 09/12/2020   ALT 1 09/12/2020   PROT 6.7 09/12/2020   ALBUMIN 4.1 09/12/2020   CALCIUM 10.1 09/12/2020   ANIONGAP 6 08/03/2015   EGFR 56 (L) 09/12/2020   Lab Results  Component Value Date   CHOL 192 09/12/2020   Lab Results  Component Value Date   HDL 40 09/12/2020   Lab Results  Component Value Date   LDLCALC 109 (H) 09/12/2020   Lab Results  Component Value Date   TRIG 252 (H)  09/12/2020   Lab Results  Component Value Date   CHOLHDL 4.8 (H) 09/12/2020   Lab Results  Component Value Date   HGBA1C 5.6 08/01/2015       Assessment & Plan:   Problem List Items Addressed This Visit  Other   Incisional infection - Primary    Stop bactrim ds.  Start cipro 250 mg one twice a day x 7 days.  Follow up if not improving.       Visit for suture removal    Sutures removed       Meds ordered this encounter  Medications   ciprofloxacin (CIPRO) 250 MG tablet    Sig: Take 1 tablet (250 mg total) by mouth 2 (two) times daily for 7 days.    Dispense:  14 tablet    Refill:  0    No orders of the defined types were placed in this encounter.    Follow-up: No follow-ups on file.  An After Visit Summary was printed and given to the patient.  Rochel Brome, MD [facility] [phone]

## 2020-12-25 NOTE — Patient Instructions (Signed)
Start Cipro 250 mg twice daily for 1 week. Continue to gently flex knee back and forth. Sutures removed. If increased pain, drainage or fever

## 2020-12-25 NOTE — Assessment & Plan Note (Signed)
Stop bactrim ds.  Start cipro 250 mg one twice a day x 7 days.  Follow up if not improving.

## 2020-12-25 NOTE — Assessment & Plan Note (Signed)
Sutures removed.

## 2021-02-06 ENCOUNTER — Ambulatory Visit: Payer: Medicare HMO | Admitting: Legal Medicine

## 2021-02-06 ENCOUNTER — Encounter: Payer: Self-pay | Admitting: Nurse Practitioner

## 2021-02-06 ENCOUNTER — Ambulatory Visit (INDEPENDENT_AMBULATORY_CARE_PROVIDER_SITE_OTHER): Payer: Medicare HMO | Admitting: Nurse Practitioner

## 2021-02-06 ENCOUNTER — Other Ambulatory Visit: Payer: Self-pay

## 2021-02-06 ENCOUNTER — Ambulatory Visit: Payer: Medicare HMO | Admitting: Nurse Practitioner

## 2021-02-06 VITALS — BP 154/80 | HR 73 | Temp 97.2°F | Ht 60.0 in | Wt 159.0 lb

## 2021-02-06 DIAGNOSIS — R69 Illness, unspecified: Secondary | ICD-10-CM | POA: Diagnosis not present

## 2021-02-06 DIAGNOSIS — R531 Weakness: Secondary | ICD-10-CM | POA: Diagnosis not present

## 2021-02-06 DIAGNOSIS — R5383 Other fatigue: Secondary | ICD-10-CM

## 2021-02-06 DIAGNOSIS — F32A Depression, unspecified: Secondary | ICD-10-CM | POA: Diagnosis not present

## 2021-02-06 DIAGNOSIS — R296 Repeated falls: Secondary | ICD-10-CM | POA: Diagnosis not present

## 2021-02-06 DIAGNOSIS — F331 Major depressive disorder, recurrent, moderate: Secondary | ICD-10-CM

## 2021-02-06 MED ORDER — VORTIOXETINE HBR 5 MG PO TABS: 5.0000 mg | ORAL_TABLET | Freq: Every day | ORAL | 0 refills | Status: DC

## 2021-02-06 MED ORDER — VORTIOXETINE HBR 10 MG PO TABS: 10.0000 mg | ORAL_TABLET | Freq: Every day | ORAL | 0 refills | Status: DC

## 2021-02-06 NOTE — Patient Instructions (Addendum)
We will call you with lab results [name] Recommend counseling for depression Begin Trintellix 5 mg by mouth daily for 1 week, then increase to Trintellix 10 mg one tablet by mouth daily for 3 weeks Notify office immediately of any adverse side effects of Trintellix Follow-up 03/14/21 at 10:00 with Dr Marina Goodell as scheduled, or sooner if needed  Managing Depression, Adult Depression is a mental health condition that affects your thoughts, feelings, and actions. Being diagnosed with depression can bring you relief if you did not know why you have felt or behaved a certain way. It could also leave you feeling overwhelmed with uncertainty about your future. Preparing yourself to manage your symptoms can help you feel more positive about your future. How to manage lifestyle changes Managing stress Stress is your body's reaction to life changes and events, both good and bad. Stress can add to your feelings of depression. Learning to manage your stress can help lessen your feelings of depression. Try some of the following approaches to reducing your stress (stress reduction techniques): Listen to music that you enjoy and that inspires you. Try using a meditation app or take a meditation class. Develop a practice that helps you connect with your spiritual self. Walk in nature, pray, or go to a place of worship. Do some deep breathing. To do this, inhale slowly through your nose. Pause at the top of your inhale for a few seconds and then exhale slowly, letting your muscles relax. Practice yoga to help relax and work your muscles. Choose a stress reduction technique that suits your lifestyle and personality. These techniques take time and practice to develop. Set aside 5-15 minutes a day to do them. Therapists can offer training in these techniques. Other things you can do to manage stress include: Keeping a stress diary. Knowing your limits and saying no when you think something is too  much. Paying attention to how you react to certain situations. You may not be able to control everything, but you can change your reaction. Adding humor to your life by watching funny films or TV shows. Making time for activities that you enjoy and that relax you.  Medicines Medicines, such as antidepressants, are often a part of treatment for depression. Talk with your pharmacist or health care provider about all the medicines, supplements, and herbal products that you take, their possible side effects, and what medicines and other products are safe to take together. Make sure to report any side effects you may have to your health care provider. Relationships Your health care provider may suggest family therapy, couples therapy, or individual therapy as part of your treatment. How to recognize changes Everyone responds differently to treatment for depression. As you recover from depression, you may start to: Have more interest in doing activities. Feel less hopeless. Have more energy. Overeat less often, or have a better appetite. Have better mental focus. It is important to recognize if your depression is not getting better or is getting worse. The symptoms you had in the beginning may return, such as: Tiredness (fatigue) or low energy. Eating too much or too little. Sleeping too much or too little. Feeling restless, agitated, or hopeless. Trouble focusing or making decisions. Unexplained physical complaints. Feeling irritable, angry, or aggressive. If you or your family members notice these symptoms coming back, let your health care provider know right away. Follow these instructions at [facility]: Activity  Try to get some form of exercise each day, such as walking, biking, swimming,  or lifting weights. Practice stress reduction techniques. Engage your mind by taking a class or doing some volunteer work. Lifestyle Get the right amount and quality of sleep. Cut down on using caffeine,  tobacco, alcohol, and other potentially harmful substances. Eat a healthy diet that includes plenty of vegetables, fruits, whole grains, low-fat dairy products, and lean protein. Do not eat a lot of foods that are high in solid fats, added sugars, or salt (sodium). General instructions Take over-the-counter and prescription medicines only as told by your health care provider. Keep all follow-up visits as told by your health care provider. This is important. Where to find support Talking to others Friends and family members can be sources of support and guidance. Talk to trusted friends or family members about your condition. Explain your symptoms to them, and let them know that you are working with a health care provider to treat your depression. Tell friends and family members how they also can be helpful. Finances Find appropriate mental health providers that fit with your financial situation. Talk with your health care provider about options to get reduced prices on your medicines. Where to find more information You can find support in your area from: Anxiety and Depression Association of America (ADAA): www.adaa.org Mental Health America: www.mentalhealthamerica.net [employer] on Mental Illness: www.nami.org Contact a health care provider if: You stop taking your antidepressant medicines, and you have any of these symptoms: Nausea. Headache. Light-headedness. Chills and body aches. Not being able to sleep (insomnia). You or your friends and family think your depression is getting worse. Get help right away if: You have thoughts of hurting yourself or others. If you ever feel like you may hurt yourself or others, or have thoughts about taking your own life, get help right away. Go to your nearest emergency department or: Call your local emergency services (911 in the [place] Call a suicide crisis helpline, such as the National Suicide Prevention Lifeline at 838 474 4839 or 988  in the [place] This is open 24 hours a day in the [place] Text the Crisis Text Line at (364)626-9981 (in the [place] Summary If you are diagnosed with depression, preparing yourself to manage your symptoms is a good way to feel positive about your future. Work with your health care provider on a management plan that includes stress reduction techniques, medicines (if applicable), therapy, and healthy lifestyle habits. Keep talking with your health care provider about how your treatment is working. If you have thoughts about taking your own life, call a suicide crisis helpline or text a crisis text line. This information is not intended to replace advice given to you by your health care provider. Make sure you discuss any questions you have with your health care provider. Document Revised: 10/04/2020 Document Reviewed: 01/20/2019 Elsevier Patient Education  2022 Elsevier Inc.    Safety In The [place]  Use a variety of textures, such as Velcro, rubber bands and raised dots to provide tactile clues.  Apply to the on/off controls on appliances, at the end of the banister, or on medicine bottles.  Flooring  The following suggestions can help reduce the risk of a Diane: Repair or replace torn carpet because a foot, cane or walker can easily get caught. Remove area carpets or throw rugs, especially if your loved one has a shuffling gait or uses a walker. When rugs  Or carpeting cannot be eliminated, place non-skid padding under rugs or secure to floor with double sided tape.  Area carpets without padding or  tape can easily buckle underneath when walked on, causing a person to slip or Diane. Use only matte, non-shiny finishes on the floor. Doorsills can be tripping hazards.  Remove them whenever possible or paint them a contrasting color. Eliminate low furniture that is easy to trip over such as coffee tables and footstools. Move furniture against walls to create a large area of uncluttered space in the center of the  room.  However, if you are rearranging a room for someone else, discuss beforehand with that person.  Many individuals rely on specific locations of furniture to find their way around a room. It is easier to see the sofa or chair when its color contrasts with that of the flooring.  Choose a fabric that contrasts with the floor material or use a bright colored piping along the edges of the seat cushion. Reduce glare on polished furniture by covering it with a large doily or tablecloth.  Diane Prevention in the [place], Adult Falls can cause injuries and can happen to people of all ages. There are many things you can do to make your [facility] safe and to help prevent falls. Ask for help when making these changes. What actions can I take to prevent falls? General Instructions Use good lighting in all rooms. Replace any light bulbs that burn out. Turn on the lights in dark areas. Use night-lights. Keep items that you use often in easy-to-reach places. Lower the shelves around your Yates if needed. Set up your furniture so you have a clear path. Avoid moving your furniture around. Do not have throw rugs or other things on the floor that can make you trip. Avoid walking on wet floors. If any of your floors are uneven, fix them. Add color or contrast paint or tape to clearly mark and help you see: Grab bars or handrails. First and last steps of staircases. Where the edge of each step is. If you use a stepladder: Make sure that it is fully opened. Do not climb a closed stepladder. Make sure the sides of the stepladder are locked in place. Ask someone to hold the stepladder while you use it. Know where your pets are when moving through your Yates. What can I do in the bathroom?   Keep the floor dry. Clean up any water on the floor right away. Remove soap buildup in the tub or shower. Use nonskid mats or decals on the floor of the tub or shower. Attach bath mats securely with double-sided, nonslip rug  tape. If you need to sit down in the shower, use a plastic, nonslip stool. Install grab bars by the toilet and in the tub and shower. Do not use towel bars as grab bars. What can I do in the bedroom? Make sure that you have a light by your bed that is easy to reach. Do not use any sheets or blankets for your bed that hang to the floor. Have a firm chair with side arms that you can use for support when you get dressed. What can I do in the kitchen? Clean up any spills right away. If you need to reach something above you, use a step stool with a grab bar. Keep electrical cords out of the way. Do not use floor polish or wax that makes floors slippery. What can I do with my stairs? Do not leave any items on the stairs. Make sure that you have a light switch at the top and the bottom of the stairs. Make sure that  there are handrails on both sides of the stairs. Fix handrails that are broken or loose. Install nonslip stair treads on all your stairs. Avoid having throw rugs at the top or bottom of the stairs. Choose a carpet that does not hide the edge of the steps on the stairs. Check carpeting to make sure that it is firmly attached to the stairs. Fix carpet that is loose or worn. What can I do on the outside of my Yates? Use bright outdoor lighting. Fix the edges of walkways and driveways and fix any cracks. Remove anything that might make you trip as you walk through a door, such as a raised step or threshold. Trim any bushes or trees on paths to your Yates. Check to see if handrails are loose or broken and that both sides of all steps have handrails. Install guardrails along the edges of any raised decks and porches. Clear paths of anything that can make you trip, such as tools or rocks. Have leaves, snow, or ice cleared regularly. Use sand or salt on paths during winter. Clean up any spills in your garage right away. This includes grease or oil spills. What other actions can I take? Wear  shoes that: Have a low heel. Do not wear high heels. Have rubber bottoms. Feel good on your feet and fit well. Are closed at the toe. Do not wear open-toe sandals. Use tools that help you move around if needed. These include: Canes. Walkers. Scooters. Crutches. Review your medicines with your doctor. Some medicines can make you feel dizzy. This can increase your chance of falling. Ask your doctor what else you can do to help prevent falls. Where to find more information Centers for Disease Control and Prevention, [employer]: FootballExhibition.com.br [employer] on Aging: https://walker.com/ Contact a doctor if: You are afraid of falling at [facility]. You feel weak, drowsy, or dizzy at [facility]. You Diane at [facility]. Summary There are many simple things that you can do to make your [facility] safe and to help prevent falls. Ways to make your [facility] safe include removing things that can make you trip and installing grab bars in the bathroom. Ask for help when making these changes in your Y[facility]. This information is not intended to replace advice given to you by your health care provider. Make sure you discuss any questions you have with your health care provider. Document Revised: 10/13/2019 Document Reviewed: 10/13/2019 Elsevier Patient Education  2022 [employer].

## 2021-02-06 NOTE — Progress Notes (Signed)
Acute Office Visit  Subjective:    Patient ID: [name], female    DOB: Jun 21, 1940, 80 y.o.   MRN: [id number]  Chief Complaint  Patient presents with   Extremity Weakness    HPI [name] is a 80 year old Caucasian female that presents for evaluation of bilateral leg weakness and fatigue x 2 months ago. She is accompanied by a family friend. States,  "I can't alk right, feels like I had a stroke". Denies any facial drooping or weakness, no slurred speech, did not seek emergency medical care. States PCP increased Effexor prescription was increased several months ago. States she stopped it abruptly without discussion with PCP due to reading information "in a medical book that said Effexor was bad for your legs".  States she does have recurring feelings of depression due to caring for spouse with dementia. Symptoms include irritability and crying. She tells me prior depression medications have included Lexapro and Zoloft. She denies seeking counseling and has declined referral information for counseling today.   [name] states she is independent with ADLS including handling finances, medications, and housecleaning. States she has fallen several times in the past few weeks, denies any injuries. States she was able to get herself up off the floor. States she uses a walker in her home. She is seated in a w/c today.  States she was told by orthopedic doctor that she needed a right total knee replacement. States she has declined knee replacement. She has a past history of CVA four years ago. States she underwent PT after CVA but has declined referral for gait and balance training today.   Past Medical History:  Diagnosis Date   Benign paroxysmal vertigo, unspecified ear 06/07/2019   CKD (chronic kidney disease), stage III (HCC)    GERD (gastroesophageal reflux disease)    Glaucoma     Past Surgical History:  Procedure Laterality Date   ABDOMINAL HYSTERECTOMY     BACK SURGERY     cataract  surgery     COLONOSCOPY N/A 08/03/2015   Procedure: COLONOSCOPY;  Surgeon: Laurence Spates, MD;  Location: [facility];  Service: Endoscopy;  Laterality: N/A;   ESOPHAGOGASTRODUODENOSCOPY N/A 07/31/2015   Procedure: ESOPHAGOGASTRODUODENOSCOPY (EGD);  Surgeon: Laurence Spates, MD;  Location: [facility] ENDOSCOPY;  Service: Endoscopy;  Laterality: N/A;   OOPHORECTOMY     TOTAL HIP ARTHROPLASTY     URETER SURGERY      Family History  Problem Relation Age of Onset   Hypertension Mother    Hypertension Father    Heart attack Father    Hypertension Brother    Diabetes Brother     Social History   Socioeconomic History   Marital status: Married    Spouse name: Not on file   Number of children: Not on file   Years of education: Not on file   Highest education level: Not on file  Occupational History   Not on file  Tobacco Use   Smoking status: Never   Smokeless tobacco: Never  Vaping Use   Vaping Use: Never used  Substance and Sexual Activity   Alcohol use: No   Drug use: No   Sexual activity: Not Currently  Other Topics Concern   Not on file  Social History Narrative   Not on file   Social Determinants of Health   Financial Resource Strain: Not on file  Food Insecurity: No Food Insecurity   Worried About [place] in the Last Year: Never true   Ran Out  of Food in the Last Year: Never true  Transportation Needs: No Transportation Needs   Lack of Transportation (Medical): No   Lack of Transportation (Non-Medical): No  Physical Activity: Not on file  Stress: Not on file  Social Connections: Not on file  Intimate Partner Violence: Not on file    Outpatient Medications Prior to Visit  Medication Sig Dispense Refill   Acetaminophen (TYLENOL) 325 MG CAPS Take 650 mg by mouth every 6 (six) hours as needed.     Alirocumab (PRALUENT) 75 MG/ML SOAJ INJECT 1 PEN INTO THE SKIN EVERY 14 (FOURTEEN) DAYS. 2 mL 11   amLODipine (NORVASC) 10 MG tablet TAKE 1 TABLET BY MOUTH EVERY DAY  90 tablet 2   Cholecalciferol 1.25 MG (50000 UT) capsule Take 1 capsule (50,000 Units total) by mouth once a week. 12 capsule 3   clopidogrel (PLAVIX) 75 MG tablet TAKE 1 TABLET BY MOUTH EVERY DAY 90 tablet 2   furosemide (LASIX) 20 MG tablet Take 1 tablet (20 mg total) by mouth daily. Taking every day for swelling 90 tablet 2   gabapentin (NEURONTIN) 100 MG capsule Take 1 capsule (100 mg total) by mouth 3 (three) times daily. 90 capsule 3   latanoprost (XALATAN) 0.005 % ophthalmic solution 1 drop at bedtime.     loratadine (CLARITIN) 10 MG tablet Take 10 mg by mouth daily.     pantoprazole (PROTONIX) 40 MG tablet TAKE 1 TABLET BY MOUTH TWICE A DAY 180 tablet 2   valsartan (DIOVAN) 320 MG tablet TAKE 1 TABLET BY MOUTH EVERY DAY 90 tablet 2   venlafaxine XR (EFFEXOR-XR) 75 MG 24 hr capsule TAKE 1 CAPSULE BY MOUTH DAILY WITH BREAKFAST. 90 capsule 2   No facility-administered medications prior to visit.    Allergies  Allergen Reactions   Codeine Nausea And Vomiting    Nausea    Meclizine Other (See Comments)    Lethargy, extreme sleepiness   Requip [Ropinirole Hcl] Nausea And Vomiting   Statins     Muscle weakness    Review of Systems  Constitutional:  Positive for fatigue. Negative for chills and fever.  HENT:  Negative for congestion, ear pain, postnasal drip, rhinorrhea, sinus pressure, sinus pain and sore throat.   Respiratory:  Negative for cough and shortness of breath.   Cardiovascular:  Negative for chest pain and leg swelling.  Gastrointestinal:  Negative for diarrhea and nausea.  Neurological:  Positive for weakness.      Objective:    Physical Exam Vitals reviewed.  Constitutional:      Appearance: Normal appearance.  HENT:     Head: Normocephalic.     Right Ear: Tympanic membrane normal.     Left Ear: Tympanic membrane normal.     Nose: Nose normal.     Mouth/Throat:     Mouth: Mucous membranes are moist.  Eyes:     Pupils: Pupils are equal, round, and  reactive to light.  Cardiovascular:     Rate and Rhythm: Normal rate and regular rhythm.     Pulses: Normal pulses.     Heart sounds: Normal heart sounds.  Pulmonary:     Effort: Pulmonary effort is normal.     Breath sounds: Normal breath sounds.  Abdominal:     General: Bowel sounds are normal.     Palpations: Abdomen is soft.  Musculoskeletal:     Cervical back: Neck supple.  Skin:    General: Skin is warm and dry.     Capillary  Refill: Capillary refill takes less than 2 seconds.  Neurological:     General: No focal deficit present.     Mental Status: She is alert and oriented to person, place, and time.     Cranial Nerves: No cranial nerve deficit.     Sensory: No sensory deficit.     Motor: Weakness present.     Gait: Gait normal.     Deep Tendon Reflexes: Reflexes normal.  Psychiatric:        Mood and Affect: Mood normal.        Behavior: Behavior normal.   Pulse 75   Temp (!) 97.2 F (36.2 C)   Ht 5' (1.524 m)   Wt 159 lb (72.1 kg)   SpO2 95%   BMI 31.05 kg/m  BP (!) 154/80   Pulse 73   Temp (!) 97.2 F (36.2 C)   Ht 5' (1.524 m)   Wt 159 lb (72.1 kg)   SpO2 95%   BMI 31.05 kg/m   Wt Readings from Last 3 Encounters:  02/06/21 159 lb (72.1 kg)  12/18/20 161 lb 9.6 oz (73.3 kg)  09/12/20 163 lb (73.9 kg)    Health Maintenance Due  Topic Date Due   Zoster Vaccines- Shingrix (1 of 2) Never done   Pneumonia Vaccine 3+ Years old (1 - PCV) Never done   COVID-19 Vaccine (3 - Booster for Pfizer series) 09/01/2019   INFLUENZA VACCINE  Never done       Lab Results  Component Value Date   TSH 2.550 05/15/2020   Lab Results  Component Value Date   WBC 6.1 09/12/2020   HGB 12.5 09/12/2020   HCT 39.1 09/12/2020   MCV 94 09/12/2020   PLT 346 09/12/2020   Lab Results  Component Value Date   NA 140 09/12/2020   K 4.7 09/12/2020   CO2 23 09/12/2020   GLUCOSE 101 (H) 09/12/2020   BUN 16 09/12/2020   CREATININE 1.02 (H) 09/12/2020   BILITOT 0.3  09/12/2020   ALKPHOS 128 (H) 09/12/2020   AST 12 09/12/2020   ALT 1 09/12/2020   PROT 6.7 09/12/2020   ALBUMIN 4.1 09/12/2020   CALCIUM 10.1 09/12/2020   ANIONGAP 6 08/03/2015   EGFR 56 (L) 09/12/2020   Lab Results  Component Value Date   CHOL 192 09/12/2020   Lab Results  Component Value Date   HDL 40 09/12/2020   Lab Results  Component Value Date   LDLCALC 109 (H) 09/12/2020   Lab Results  Component Value Date   TRIG 252 (H) 09/12/2020   Lab Results  Component Value Date   CHOLHDL 4.8 (H) 09/12/2020   Lab Results  Component Value Date   HGBA1C 5.6 08/01/2015       Assessment & Plan:   1. Moderate recurrent major depression (HCC) - vortioxetine HBr (TRINTELLIX) 5 MG TABS tablet; Take 1 tablet (5 mg total) by mouth daily.  Dispense: 7 tablet; Refill: 0 - vortioxetine HBr (TRINTELLIX) 10 MG TABS tablet; Take 1 tablet (10 mg total) by mouth daily.  Dispense: 21 tablet; Refill: 0 -recommend counseling -begin Trintellix 5 mg by mouth daily for 1 week then increase to Trintellix 10 mg by mouth daily for three weeks -notify office immediately of any adverse side effects of Trintellix  2. Generalized weakness - CBC with Differential/Platelet - Comprehensive metabolic panel -Pt declines referral for gait/balance training with PT  3. Frequent falls - CBC with Differential/Platelet - Comprehensive metabolic panel -fall precautions -  pt refused physical therapy referral   4. Right sided weakness - CBC with Differential/Platelet - Comprehensive metabolic panel  5. Fatigue due to depression - CBC with Differential/Platelet - Comprehensive metabolic panel - vortioxetine HBr (TRINTELLIX) 5 MG TABS tablet; Take 1 tablet (5 mg total) by mouth daily.  Dispense: 7 tablet; Refill: 0 - vortioxetine HBr (TRINTELLIX) 10 MG TABS tablet; Take 1 tablet (10 mg total) by mouth daily.  Dispense: 21 tablet; Refill: 0       We will call you with lab results Fall precautions at  home Recommend counseling for depression Begin Trintellix 5 mg by mouth daily for 1 week, then increase to Trintellix 10 mg one tablet by mouth daily for 3 weeks Notify office immediately of any adverse side effects of Trintellix Follow-up 03/14/21 at 10:00 with Dr Henrene Pastor as scheduled, or sooner if needed  Follow-up: 03/14/21 at 10:00 with Dr Henrene Pastor as scheduled or sooner if needed   An After Visit Summary was printed and given to the patient.  I, Rip Harbour, NP, have reviewed all documentation for this visit. The documentation on 02/06/21 for the exam, diagnosis, procedures, and orders are all accurate and complete.     Signed, Rip Harbour, NP [place] [phone]

## 2021-02-07 LAB — COMPREHENSIVE METABOLIC PANEL
ALT: 12 IU/L (ref 0–32)
AST: 13 IU/L (ref 0–40)
Albumin/Globulin Ratio: 1.7 (ref 1.2–2.2)
Albumin: 4.2 g/dL (ref 3.7–4.7)
Alkaline Phosphatase: 123 IU/L — ABNORMAL HIGH (ref 44–121)
BUN/Creatinine Ratio: 18 (ref 12–28)
BUN: 19 mg/dL (ref 8–27)
Bilirubin Total: 0.2 mg/dL (ref 0.0–1.2)
CO2: 22 mmol/L (ref 20–29)
Calcium: 9.9 mg/dL (ref 8.7–10.3)
Chloride: 106 mmol/L (ref 96–106)
Creatinine, Ser: 1.08 mg/dL — ABNORMAL HIGH (ref 0.57–1.00)
Globulin, Total: 2.5 g/dL (ref 1.5–4.5)
Glucose: 129 mg/dL — ABNORMAL HIGH (ref 70–99)
Potassium: 4.5 mmol/L (ref 3.5–5.2)
Sodium: 142 mmol/L (ref 134–144)
Total Protein: 6.7 g/dL (ref 6.0–8.5)
eGFR: 52 mL/min/{1.73_m2} — ABNORMAL LOW (ref >59)

## 2021-02-07 LAB — CBC WITH DIFFERENTIAL/PLATELET
Basophils Absolute: 0.1 10*3/uL (ref 0.0–0.2)
Basos: 1 %
EOS (ABSOLUTE): 0.2 10*3/uL (ref 0.0–0.4)
Eos: 3 %
Hematocrit: 38.7 % (ref 34.0–46.6)
Hemoglobin: 12.6 g/dL (ref 11.1–15.9)
Immature Grans (Abs): 0 10*3/uL (ref 0.0–0.1)
Immature Granulocytes: 0 %
Lymphocytes Absolute: 1.4 10*3/uL (ref 0.7–3.1)
Lymphs: 21 %
MCH: 30.5 pg (ref 26.6–33.0)
MCHC: 32.6 g/dL (ref 31.5–35.7)
MCV: 94 fL (ref 79–97)
Monocytes Absolute: 0.7 10*3/uL (ref 0.1–0.9)
Monocytes: 11 %
Neutrophils Absolute: 4.1 10*3/uL (ref 1.4–7.0)
Neutrophils: 64 %
Platelets: 325 10*3/uL (ref 150–450)
RBC: 4.13 x10E6/uL (ref 3.77–5.28)
RDW: 12.3 % (ref 11.7–15.4)
WBC: 6.4 10*3/uL (ref 3.4–10.8)

## 2021-03-14 ENCOUNTER — Other Ambulatory Visit: Payer: Self-pay

## 2021-03-14 ENCOUNTER — Ambulatory Visit (INDEPENDENT_AMBULATORY_CARE_PROVIDER_SITE_OTHER): Payer: Medicare HMO | Admitting: Legal Medicine

## 2021-03-14 ENCOUNTER — Telehealth: Payer: Self-pay

## 2021-03-14 ENCOUNTER — Encounter: Payer: Self-pay | Admitting: Legal Medicine

## 2021-03-14 VITALS — BP 130/78 | HR 81 | Temp 98.6°F | Resp 18 | Ht 60.0 in | Wt 159.8 lb

## 2021-03-14 DIAGNOSIS — F32A Depression, unspecified: Secondary | ICD-10-CM

## 2021-03-14 DIAGNOSIS — I10 Essential (primary) hypertension: Secondary | ICD-10-CM

## 2021-03-14 DIAGNOSIS — R0602 Shortness of breath: Secondary | ICD-10-CM | POA: Diagnosis not present

## 2021-03-14 DIAGNOSIS — I69393 Ataxia following cerebral infarction: Secondary | ICD-10-CM | POA: Diagnosis not present

## 2021-03-14 DIAGNOSIS — R5383 Other fatigue: Secondary | ICD-10-CM | POA: Diagnosis not present

## 2021-03-14 DIAGNOSIS — I639 Cerebral infarction, unspecified: Secondary | ICD-10-CM | POA: Diagnosis not present

## 2021-03-14 DIAGNOSIS — E782 Mixed hyperlipidemia: Secondary | ICD-10-CM

## 2021-03-14 DIAGNOSIS — I63119 Cerebral infarction due to embolism of unspecified vertebral artery: Secondary | ICD-10-CM

## 2021-03-14 DIAGNOSIS — I350 Nonrheumatic aortic (valve) stenosis: Secondary | ICD-10-CM

## 2021-03-14 DIAGNOSIS — N1831 Chronic kidney disease, stage 3a: Secondary | ICD-10-CM

## 2021-03-14 DIAGNOSIS — R69 Illness, unspecified: Secondary | ICD-10-CM | POA: Diagnosis not present

## 2021-03-14 DIAGNOSIS — F331 Major depressive disorder, recurrent, moderate: Secondary | ICD-10-CM

## 2021-03-14 DIAGNOSIS — I6782 Cerebral ischemia: Secondary | ICD-10-CM | POA: Diagnosis not present

## 2021-03-14 MED ORDER — VORTIOXETINE HBR 10 MG PO TABS: 10.0000 mg | ORAL_TABLET | Freq: Every day | ORAL | 3 refills | Status: DC

## 2021-03-14 NOTE — Progress Notes (Signed)
Subjective:  Patient ID: [name], female    DOB: 26-May-1940  Age: 80 y.o. MRN: [id number]  Chief Complaint  Patient presents with   Hypertension    HPI: late October sudden weakness and trouble walking.  Head feels swimmy.  May have had a stroke.  Patient presents for follow up of hypertension.  Patient tolerating valsartan , amlodipine well with side effects.  Patient was diagnosed with hypertension 2010 so has been treated for hypertension for 12 years.Patient is working on maintaining diet and exercise regimen and follows up as directed. Complication include strokes.   Patient presents with hyperlipidemia.  Compliance with treatment has been good; patient takes medicines as directed, maintains low cholesterol diet, follows up as directed, and maintains exercise regimen.  Patient is using praulent without problems.    Current Outpatient Medications on File Prior to Visit  Medication Sig Dispense Refill   Acetaminophen (TYLENOL) 325 MG CAPS Take 650 mg by mouth every 6 (six) hours as needed.     Alirocumab (PRALUENT) 75 MG/ML SOAJ INJECT 1 PEN INTO THE SKIN EVERY 14 (FOURTEEN) DAYS. 2 mL 11   amLODipine (NORVASC) 10 MG tablet TAKE 1 TABLET BY MOUTH EVERY DAY 90 tablet 2   Cholecalciferol 1.25 MG (50000 UT) capsule Take 1 capsule (50,000 Units total) by mouth once a week. 12 capsule 3   clopidogrel (PLAVIX) 75 MG tablet TAKE 1 TABLET BY MOUTH EVERY DAY 90 tablet 2   furosemide (LASIX) 20 MG tablet Take 1 tablet (20 mg total) by mouth daily. Taking every day for swelling 90 tablet 2   gabapentin (NEURONTIN) 100 MG capsule Take 1 capsule (100 mg total) by mouth 3 (three) times daily. 90 capsule 3   latanoprost (XALATAN) 0.005 % ophthalmic solution 1 drop at bedtime.     loratadine (CLARITIN) 10 MG tablet Take 10 mg by mouth daily.     pantoprazole (PROTONIX) 40 MG tablet TAKE 1 TABLET BY MOUTH TWICE A DAY 180 tablet 2   valsartan (DIOVAN) 320 MG tablet TAKE 1 TABLET BY MOUTH EVERY  DAY 90 tablet 2   No current facility-administered medications on file prior to visit.   Past Medical History:  Diagnosis Date   Benign paroxysmal vertigo, unspecified ear 06/07/2019   CKD (chronic kidney disease), stage III (HCC)    GERD (gastroesophageal reflux disease)    Glaucoma    Past Surgical History:  Procedure Laterality Date   ABDOMINAL HYSTERECTOMY     BACK SURGERY     cataract surgery     COLONOSCOPY N/A 08/03/2015   Procedure: COLONOSCOPY;  Surgeon: Carman Ching, MD;  Location: [facility] ENDOSCOPY;  Service: Endoscopy;  Laterality: N/A;   ESOPHAGOGASTRODUODENOSCOPY N/A 07/31/2015   Procedure: ESOPHAGOGASTRODUODENOSCOPY (EGD);  Surgeon: Carman Ching, MD;  Location: [facility] ENDOSCOPY;  Service: Endoscopy;  Laterality: N/A;   OOPHORECTOMY     TOTAL HIP ARTHROPLASTY     URETER SURGERY      Family History  Problem Relation Age of Onset   Hypertension Mother    Hypertension Father    Heart attack Father    Hypertension Brother    Diabetes Brother    Social History   Socioeconomic History   Marital status: Married    Spouse name: Not on file   Number of children: Not on file   Years of education: Not on file   Highest education level: Not on file  Occupational History   Not on file  Tobacco Use   Smoking status:  Never   Smokeless tobacco: Never  Vaping Use   Vaping Use: Never used  Substance and Sexual Activity   Alcohol use: No   Drug use: No   Sexual activity: Not Currently  Other Topics Concern   Not on file  Social History Narrative   Not on file   Social Determinants of Health   Financial Resource Strain: Not on file  Food Insecurity: No Food Insecurity   Worried About Running Out of Food in the Last Year: Never true   Ran Out of Food in the Last Year: Never true  Transportation Needs: No Transportation Needs   Lack of Transportation (Medical): No   Lack of Transportation (Non-Medical): No  Physical Activity: Not on file  Stress: Not on file  Social  Connections: Not on file    Review of Systems  Constitutional:  Negative for activity change and appetite change.  Eyes:  Negative for visual disturbance.  Respiratory:  Negative for chest tightness and shortness of breath.   Cardiovascular:  Negative for chest pain, palpitations and leg swelling.  Gastrointestinal:  Negative for abdominal distention and abdominal pain.  Endocrine: Negative for polyuria.  Genitourinary: Negative.   Musculoskeletal:  Negative for back pain.  Neurological:  Positive for weakness.  Psychiatric/Behavioral: Negative.      Objective:  BP 130/78    Pulse 81    Temp 98.6 F (37 C)    Resp 18    Ht 5' (1.524 m)    Wt 159 lb 12.8 oz (72.5 kg)    SpO2 98%    BMI 31.21 kg/m   BP/Weight 03/14/2021 02/06/2021 0000000  Systolic BP [id number]  Diastolic BP 78 80 74  Wt. (Lbs) 159.8 159 161.6  BMI 31.21 31.05 31.56    Physical Exam Vitals reviewed.  Constitutional:      General: She is in acute distress.     Appearance: Normal appearance.  HENT:     Right Ear: Tympanic membrane normal.     Left Ear: Tympanic membrane normal.  Eyes:     Conjunctiva/sclera: Conjunctivae normal.     Pupils: Pupils are equal, round, and reactive to light.     Comments: Right eye droop  Cardiovascular:     Rate and Rhythm: Normal rate and regular rhythm.     Pulses: Normal pulses.     Heart sounds: Murmur heard.  Pulmonary:     Effort: Pulmonary effort is normal. No respiratory distress.     Breath sounds: Normal breath sounds. No wheezing.  Abdominal:     General: Abdomen is flat. Bowel sounds are normal. There is no distension.     Palpations: Abdomen is soft.     Tenderness: There is no abdominal tenderness.  Musculoskeletal:     Cervical back: Normal range of motion and neck supple.     Right lower leg: No edema.     Left lower leg: No edema.  Skin:    Capillary Refill: Capillary refill takes less than 2 seconds.  Neurological:     Mental Status: She is  alert.     Motor: Weakness present.     Gait: Gait abnormal.     Deep Tendon Reflexes: Reflexes normal.     Comments: Weakness right side leg > arm.        Lab Results  Component Value Date   WBC 6.4 02/06/2021   HGB 12.6 02/06/2021   HCT 38.7 02/06/2021   PLT 325 02/06/2021   GLUCOSE 129 (  H) 02/06/2021   CHOL 192 09/12/2020   TRIG 252 (H) 09/12/2020   HDL 40 09/12/2020   LDLCALC 109 (H) 09/12/2020   ALT 12 02/06/2021   AST 13 02/06/2021   NA 142 02/06/2021   K 4.5 02/06/2021   CL 106 02/06/2021   CREATININE 1.08 (H) 02/06/2021   BUN 19 02/06/2021   CO2 22 02/06/2021   TSH 2.550 05/15/2020   INR 1.22 07/31/2015   HGBA1C 5.6 08/01/2015      Assessment & Plan:   Problem List Items Addressed This Visit       Cardiovascular and Mediastinum   Mild aortic stenosis - Primary Stable by cardiology    Stroke (cerebrum) ([place])   Relevant Orders   CT HEAD WO CONTRAST (5MM) (Completed)   Ambulatory referral to Neurology New sudden onset weakness 6 weeks ago with weakness more on right side CT shows microvascular changes with old cerebellar stroke    Essential hypertension   Relevant Orders   CBC with Differential/Platelet   Comprehensive metabolic panel An individual hypertension care plan was established and reinforced today.  The patient's status was assessed using clinical findings on exam and labs or diagnostic tests. The patient's success at meeting treatment goals on disease specific evidence-based guidelines and found to be well controlled. SELF MANAGEMENT: The patient and I together assessed ways to personally work towards obtaining the recommended goals. RECOMMENDATIONS: avoid decongestants found in common cold remedies, decrease consumption of alcohol, perform routine monitoring of BP with home BP cuff, exercise, reduction of dietary salt, take medicines as prescribed, try not to miss doses and quit smoking.  Regular exercise and maintaining a healthy weight is  needed.  Stress reduction may help. A CLINICAL SUMMARY including written plan identify barriers to care unique to individual due to social or financial issues.  We attempt to mutually creat solutions for individual and family understanding.      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  The patient's status was assessed using clinical findings on exam, labs, and other diagnostic testing. Patient's success at meeting treatment goals based on disease specific evidence-bassed guidelines and found to be in good control. RECOMMENDATIONS include maintaining present medicines and treatment.      Other   Mixed hyperlipidemia   Relevant Orders   Lipid panel AN INDIVIDUAL CARE PLAN for hyperlipidemia/ cholesterol was established and reinforced today.  The patient's status was assessed using clinical findings on exam, lab and other diagnostic tests. The patient's disease status was assessed based on evidence-based guidelines and found to be well controlled. MEDICATIONS were reviewed. SELF MANAGEMENT GOALS have been discussed and patient's success at attaining the goal of low cholesterol was assessed. RECOMMENDATION given include regular exercise 3 days a week and low cholesterol/low fat diet. CLINICAL SUMMARY including written plan to identify barriers unique to the patient due to social or economic  reasons was discussed.    Other Visit Diagnoses     Fatigue, unspecified type       Relevant Orders   TSH Weak on right side check thyroid function    Moderate recurrent major depression (HCC)       Relevant Medications   vortioxetine HBr (TRINTELLIX) 10 MG TABS tablet Continue on trintelix    Fatigue due to depression  Relevant Medications   vortioxetine HBr (TRINTELLIX) 10 MG TABS  tablet Continue on trintellix     .  Meds ordered this encounter  Medications   vortioxetine HBr (TRINTELLIX) 10 MG TABS tablet    Sig: Take 1 tablet (10 mg total) by mouth daily.    Dispense:  30 tablet    Refill:  3    Orders Placed This Encounter  Procedures   CT HEAD WO CONTRAST (5MM)   CBC with Differential/Platelet   Comprehensive metabolic panel   Lipid panel   TSH   Ambulatory referral to Neurology   30 plus minutes visit with review of old records  Follow-up: Return in about 4 months (around 07/13/2021).  An After Visit Summary was printed and given to the patient.  Reinaldo Meeker, MD [facility] [phone]

## 2021-03-14 NOTE — Telephone Encounter (Signed)
I called patient and let her know about CT scan results. Dr Marina Goodell recommended to continue with plavix and aspirin and continue to use her walker when she deambulate.

## 2021-03-15 ENCOUNTER — Other Ambulatory Visit: Payer: Self-pay

## 2021-03-15 LAB — COMPREHENSIVE METABOLIC PANEL
ALT: 10 IU/L (ref 0–32)
AST: 14 IU/L (ref 0–40)
Albumin/Globulin Ratio: 1.4 (ref 1.2–2.2)
Albumin: 4.1 g/dL (ref 3.7–4.7)
Alkaline Phosphatase: 118 IU/L (ref 44–121)
BUN/Creatinine Ratio: 16 (ref 12–28)
BUN: 18 mg/dL (ref 8–27)
Bilirubin Total: 0.4 mg/dL (ref 0.0–1.2)
CO2: 21 mmol/L (ref 20–29)
Calcium: 9.5 mg/dL (ref 8.7–10.3)
Chloride: 107 mmol/L — ABNORMAL HIGH (ref 96–106)
Creatinine, Ser: 1.12 mg/dL — ABNORMAL HIGH (ref 0.57–1.00)
Globulin, Total: 2.9 g/dL (ref 1.5–4.5)
Glucose: 111 mg/dL — ABNORMAL HIGH (ref 70–99)
Potassium: 4.1 mmol/L (ref 3.5–5.2)
Sodium: 143 mmol/L (ref 134–144)
Total Protein: 7 g/dL (ref 6.0–8.5)
eGFR: 50 mL/min/{1.73_m2} — ABNORMAL LOW (ref >59)

## 2021-03-15 LAB — CBC WITH DIFFERENTIAL/PLATELET
Basophils Absolute: 0 10*3/uL (ref 0.0–0.2)
Basos: 0 %
EOS (ABSOLUTE): 0.1 10*3/uL (ref 0.0–0.4)
Eos: 2 %
Hematocrit: 39.9 % (ref 34.0–46.6)
Hemoglobin: 12.9 g/dL (ref 11.1–15.9)
Immature Grans (Abs): 0 10*3/uL (ref 0.0–0.1)
Immature Granulocytes: 0 %
Lymphocytes Absolute: 0.9 10*3/uL (ref 0.7–3.1)
Lymphs: 24 %
MCH: 30.2 pg (ref 26.6–33.0)
MCHC: 32.3 g/dL (ref 31.5–35.7)
MCV: 93 fL (ref 79–97)
Monocytes Absolute: 0.4 10*3/uL (ref 0.1–0.9)
Monocytes: 10 %
Neutrophils Absolute: 2.4 10*3/uL (ref 1.4–7.0)
Neutrophils: 64 %
Platelets: 282 10*3/uL (ref 150–450)
RBC: 4.27 x10E6/uL (ref 3.77–5.28)
RDW: 12.5 % (ref 11.7–15.4)
WBC: 3.8 10*3/uL (ref 3.4–10.8)

## 2021-03-15 LAB — LIPID PANEL
Chol/HDL Ratio: 4.6 ratio — ABNORMAL HIGH (ref 0.0–4.4)
Cholesterol, Total: 152 mg/dL (ref 100–199)
HDL: 33 mg/dL — ABNORMAL LOW (ref >39)
LDL Chol Calc (NIH): 71 mg/dL (ref 0–99)
Triglycerides: 300 mg/dL — ABNORMAL HIGH (ref 0–149)
VLDL Cholesterol Cal: 48 mg/dL — ABNORMAL HIGH (ref 5–40)

## 2021-03-15 LAB — TSH: TSH: 2.85 u[IU]/mL (ref 0.450–4.500)

## 2021-03-15 LAB — CARDIOVASCULAR RISK ASSESSMENT

## 2021-03-15 NOTE — Progress Notes (Signed)
TSH normal, cbc normal, glucose 111, kidney tets stable with stage 3a, liver tests normal, triglycerides 300mg  high watch diet,  lp

## 2021-03-16 ENCOUNTER — Encounter: Payer: Self-pay | Admitting: Neurology

## 2021-03-20 ENCOUNTER — Other Ambulatory Visit: Payer: Self-pay | Admitting: Legal Medicine

## 2021-03-20 DIAGNOSIS — E538 Deficiency of other specified B group vitamins: Secondary | ICD-10-CM

## 2021-03-20 LAB — VITAMIN B12: Vitamin B-12: 165 pg/mL — ABNORMAL LOW (ref 232–1245)

## 2021-03-20 LAB — SPECIMEN STATUS REPORT

## 2021-03-20 MED ORDER — CYANOCOBALAMIN 1000 MCG/ML IJ SOLN: 1000.0000 ug | Freq: Once | INTRAMUSCULAR | 2 refills | Status: AC

## 2021-03-20 NOTE — Progress Notes (Signed)
B12 low 165, start injection once a month at office lp

## 2021-03-21 ENCOUNTER — Ambulatory Visit (INDEPENDENT_AMBULATORY_CARE_PROVIDER_SITE_OTHER): Payer: Medicare HMO

## 2021-03-21 ENCOUNTER — Other Ambulatory Visit: Payer: Self-pay

## 2021-03-21 ENCOUNTER — Ambulatory Visit: Payer: Medicare HMO

## 2021-03-21 DIAGNOSIS — E538 Deficiency of other specified B group vitamins: Secondary | ICD-10-CM

## 2021-03-21 MED ORDER — CYANOCOBALAMIN 1000 MCG/ML IJ SOLN
1000.0000 ug | Freq: Once | INTRAMUSCULAR | Status: AC
Start: 2021-03-21 — End: 2021-03-21
  Administered 2021-03-21: 14:00:00 1000 ug via INTRAMUSCULAR

## 2021-03-21 MED ORDER — CYANOCOBALAMIN 1000 MCG/ML IJ SOLN: 1000.0000 ug | INTRAMUSCULAR | 2 refills | Status: DC

## 2021-03-21 NOTE — Progress Notes (Signed)
Pt in office for first b12 injection. Pt tolerated in Left deltoid. Will return in one month for next injection.   Lorita Officer, [place] 03/21/21 2:04 PM

## 2021-03-22 ENCOUNTER — Other Ambulatory Visit: Payer: Self-pay

## 2021-03-22 MED ORDER — CYANOCOBALAMIN 1000 MCG/ML IJ SOLN
1000.0000 ug | INTRAMUSCULAR | 2 refills | Status: DC
Start: 2021-03-22 — End: 2021-05-22

## 2021-03-25 DIAGNOSIS — I2510 Atherosclerotic heart disease of native coronary artery without angina pectoris: Secondary | ICD-10-CM | POA: Insufficient documentation

## 2021-03-27 ENCOUNTER — Inpatient Hospital Stay: Payer: Medicare HMO | Admitting: Legal Medicine

## 2021-03-28 ENCOUNTER — Other Ambulatory Visit: Payer: Self-pay

## 2021-03-28 DIAGNOSIS — I6522 Occlusion and stenosis of left carotid artery: Secondary | ICD-10-CM

## 2021-03-28 DIAGNOSIS — R0989 Other specified symptoms and signs involving the circulatory and respiratory systems: Secondary | ICD-10-CM

## 2021-03-28 NOTE — Progress Notes (Signed)
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  F/U duplex in one year    Placed order for patient to have test done in March.

## 2021-03-29 ENCOUNTER — Telehealth: Payer: Self-pay

## 2021-03-29 NOTE — Chronic Care Management (AMB) (Signed)
Chronic Care Management Pharmacy Assistant   Name: [name]  MRN: [id number] DOB: 03-May-1940  Reason for Encounter: Disease State call for lipids    Recent office visits:  03/21/21 Diane Hight LPN. Seen for B12 Deficiency. Administered B12 1000 Injection.   03/20/21 Diane Bulla MD. Orders Only. Ordered B12 .ml Injection.   03/14/21 Diane Bulla MD. Seen for Mild Aortic Stenosis. Increased Vortioxetine HBR from 5 mg to 10 mg daily. Referral to Neurology.   02/06/21 Diane Hailstone NP. Seen for Extremity Weakness. Started Vortioxetine HBR 5 mg daily and 10 mg daily.  Notes state -begin Trintellix 5 mg by mouth daily for 1 week then increase to Trintellix 10 mg by mouth daily for three weeks.  12/25/20 Yates, Kirsten MD. Seen for suture/staple removal. Started Ciprofloxacin HCI 250 mg 2 times daily. D/C Bactrim 800-160 mg two times daily due to completed course.  12/18/20 Diane Bulla MD. Seen for Laceration. Started Bactrim 800-160 mg two times daily.  Recent consult visits:  None   Hospital visits:  Medication Reconciliation was completed by comparing discharge summary, patients EMR and Pharmacy list, and upon discussion with patient.  Admitted to the hospital on 12/08/20 due to Knee Laceration. Discharge date was 12/08/20. Discharged from [name].    New?Medications Started at [facility] Discharge:?? -started Bacitracin 500 units ointment due to infection   -All other medications will remain the same.    Medications: Outpatient Encounter Medications as of 03/29/2021  Medication Sig   Acetaminophen (TYLENOL) 325 MG CAPS Take 650 mg by mouth every 6 (six) hours as needed.   Alirocumab (PRALUENT) 75 MG/ML SOAJ INJECT 1 PEN INTO THE SKIN EVERY 14 (FOURTEEN) DAYS.   amLODipine (NORVASC) 10 MG tablet TAKE 1 TABLET BY MOUTH EVERY DAY   Cholecalciferol 1.25 MG (50000 UT) capsule Take 1 capsule (50,000 Units total) by mouth once a week.   clopidogrel  (PLAVIX) 75 MG tablet TAKE 1 TABLET BY MOUTH EVERY DAY   cyanocobalamin (,VITAMIN B-12,) 1000 MCG/ML injection Inject 1 mL (1,000 mcg total) into the muscle every 30 (thirty) days.   furosemide (LASIX) 20 MG tablet Take 1 tablet (20 mg total) by mouth daily. Taking every day for swelling   gabapentin (NEURONTIN) 100 MG capsule Take 1 capsule (100 mg total) by mouth 3 (three) times daily.   latanoprost (XALATAN) 0.005 % ophthalmic solution 1 drop at bedtime.   loratadine (CLARITIN) 10 MG tablet Take 10 mg by mouth daily.   pantoprazole (PROTONIX) 40 MG tablet TAKE 1 TABLET BY MOUTH TWICE A DAY   valsartan (DIOVAN) 320 MG tablet TAKE 1 TABLET BY MOUTH EVERY DAY   vortioxetine HBr (TRINTELLIX) 10 MG TABS tablet Take 1 tablet (10 mg total) by mouth daily.   No facility-administered encounter medications on file as of 03/29/2021.   Lipid Panel    Component Value Date/Time   CHOL 152 03/14/2021 1051   TRIG 300 (H) 03/14/2021 1051   HDL 33 (L) 03/14/2021 1051   LDLCALC 71 03/14/2021 1051    10-year ASCVD risk score: The ASCVD Risk score (Arnett DK, et al., 2019) failed to calculate for the following reasons:   The 2019 ASCVD risk score is only valid for ages 46 to 63   The patient has a prior MI or stroke diagnosis  Current antihyperlipidemic regimen:  Praluent 75mg  Inject 1 pen into skin every 14 days Plavix 75 mg daily  ASCVD risk enhancing conditions: age >80, HTN, and CKD  What  recent interventions/DTPs have been made by any provider to improve Cholesterol control since last CPP Visit: No recent changes   Any recent hospitalizations or ED visits since last visit with CPP? Yes  I spoke to pt daughter Diane Braun in regards to her mom and she stated that they give pt her injections of Praluent just fine. Diane Braun had other concerns about her mom not taking her Trintellix since the pt lives alone with her husband and she stated her mom can be difficult and she has not idea what she is taking and  what she's not taking. Diane Braun has concerns about her moms BP and her taking a high dose of Valsartan. She stated her moms BP is around 117/66 without her medication and she has been complaining of falls and being unsteady on her feet. I advised to schedule a follow up with the CPP since its been a while. Diane Braun wants to be there on the day of the apt so they can all discuss this together with CPP. This apt has been scheduled for 04/04/21 at 12:00 pm  Adherence Review: Does the patient have >5 day gap between last estimated fill dates? No  Star Rating Drugs Medication Name Last Fill Days supply None noted   Care Gaps: Last annual wellness visit? 06/13/20  [name], CMA Clinical Pharmacist Assistant  [phone]

## 2021-03-30 ENCOUNTER — Telehealth: Payer: Self-pay

## 2021-03-30 NOTE — Progress Notes (Signed)
° ° °  Chronic Care Management Pharmacy Assistant   Name: [name]  MRN: [id number] DOB: 1940-11-23   Reason for Encounter: Appointment Reminder call for appointment on 04/04/2021 at 12.00 pm. Patient confirmed appointment.    Doreen Beam Clinical Pharmacist Assistant [phone]

## 2021-04-02 NOTE — Progress Notes (Signed)
NEUROLOGY CONSULTATION NOTE  [name] MRN: [id number] DOB: [birth year]  Referring provider: [name], MD Primary care provider: [name], MD  Reason for consult:  stroke  Assessment/Plan:   Bilateral leg weakness, falls - etiology unclear.  It may very well be related to B12 deficiency.  Also consider vertebrobasilar insufficiency vs lumbar stenosis. 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  Follow up in 4 months.  If no improvement, will evaluate for other possible etiologies.  Subjective:  [name] is an 81 year old right-handed female with CHF, HTN, HLD, CKD and history of CVA who presents for stroke.  History supplemented by her daughter.  In September, she started experiencing heaviness in her legs causing her to fall.  No associated dizziness or loss of consciousness.  Legs just give out.  She has pain in her legs but no numbness.  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  She previously used a cane but had to resort to a walker.  She also noticed some weakness in the right arm as well.  Venlafaxine was changed to Trintellix which helped but symptoms continued.  Daughter is concerned that she may be on too much antihypertensive medications.  CT head performed on 03/14/2021 showed chronic small vessel ischemic changes in the cerebral white matter with remote left cerebellar infarct but no acute intracranial abnormalities.  LDL was 71.  Serum glucose was 111.  TSH was 2.850.  B12 was low at 165 and she was started on monthly injections 3  weeks ago.    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  She also was found to have bilateral ICA stenosis.  Carotid ultrasound from 06/20/2020 showed 40-59% left ICA stenosis and 1-39% right ICA stenosis.     PAST MEDICAL HISTORY: Past Medical History:  Diagnosis Date   Benign paroxysmal vertigo, unspecified ear 06/07/2019   CKD (chronic kidney disease), stage III (HCC)    GERD (gastroesophageal reflux disease)    Glaucoma     PAST SURGICAL HISTORY: Past Surgical History:  Procedure Laterality Date   ABDOMINAL HYSTERECTOMY     BACK SURGERY     cataract surgery     COLONOSCOPY N/A 08/03/2015   Procedure: COLONOSCOPY;  Surgeon: Carman Ching, MD;  Location: [facility] ENDOSCOPY;  Service: Endoscopy;  Laterality: N/A;   ESOPHAGOGASTRODUODENOSCOPY N/A 07/31/2015   Procedure: ESOPHAGOGASTRODUODENOSCOPY (EGD);  Surgeon: Carman Ching, MD;  Location: [facility] ENDOSCOPY;  Service: Endoscopy;  Laterality: N/A;   OOPHORECTOMY     TOTAL HIP ARTHROPLASTY     URETER SURGERY      MEDICATIONS: Current Outpatient Medications on File Prior to Visit  Medication Sig Dispense Refill   Acetaminophen (TYLENOL) 325 MG CAPS Take 650 mg by mouth every 6 (six) hours as needed.     Alirocumab (PRALUENT) 75 MG/ML SOAJ INJECT 1 PEN INTO THE SKIN EVERY 14 (FOURTEEN) DAYS. 2 mL 11  amLODipine (NORVASC) 10 MG tablet TAKE 1 TABLET BY MOUTH EVERY DAY 90 tablet 2   Cholecalciferol 1.25 MG (50000 UT) capsule Take 1 capsule (50,000 Units total) by mouth once a week. 12 capsule 3   clopidogrel (PLAVIX) 75 MG tablet TAKE 1 TABLET BY MOUTH EVERY DAY 90 tablet 2   cyanocobalamin (,VITAMIN B-12,) 1000 MCG/ML injection Inject 1 mL (1,000 mcg total) into the muscle every 30 (thirty) days. 30 mL 2   furosemide (LASIX) 20 MG tablet  Take 1 tablet (20 mg total) by mouth daily. Taking every day for swelling 90 tablet 2   gabapentin (NEURONTIN) 100 MG capsule Take 1 capsule (100 mg total) by mouth 3 (three) times daily. 90 capsule 3   latanoprost (XALATAN) 0.005 % ophthalmic solution 1 drop at bedtime.     loratadine (CLARITIN) 10 MG tablet Take 10 mg by mouth daily.     pantoprazole (PROTONIX) 40 MG tablet TAKE 1 TABLET BY MOUTH TWICE A DAY 180 tablet 2   valsartan (DIOVAN) 320 MG tablet TAKE 1 TABLET BY MOUTH EVERY DAY 90 tablet 2   vortioxetine HBr (TRINTELLIX) 10 MG TABS tablet Take 1 tablet (10 mg total) by mouth daily. 30 tablet 3   No current facility-administered medications on file prior to visit.    ALLERGIES: Allergies  Allergen Reactions   Codeine Nausea And Vomiting    Nausea    Meclizine Other (See Comments)    Lethargy, extreme sleepiness   Requip [Ropinirole Hcl] Nausea And Vomiting   Statins     Muscle weakness    FAMILY HISTORY: Family History  Problem Relation Age of Onset   Hypertension Mother    Hypertension Father    Heart attack Father    Hypertension Brother    Diabetes Brother     Objective:  Blood pressure (!) 151/71, pulse 88, height 5' (1.524 m), weight 158 lb 3.2 oz (71.8 kg), SpO2 96 %. General: No acute distress.  Patient appears well-groomed.   Head:  Normocephalic/atraumatic Eyes:  fundi examined but not visualized Neck: supple, no paraspinal tenderness, full range of motion Back: No paraspinal tenderness Heart: regular rate and rhythm Lungs: Clear to auscultation bilaterally. Vascular: No carotid bruits. Neurological Exam: Mental status: alert and oriented to person, place, and time, recent and remote memory intact, fund of knowledge intact, attention and concentration intact, speech fluent and not dysarthric, language intact. Cranial nerves: CN I: not tested CN II: pupils equal, round and reactive to light, visual fields intact CN III, IV, VI:  full range of  motion, no nystagmus, right ptosis CN V: facial sensation intact. CN VII: upper and lower face symmetric CN VIII: hearing intact CN IX, X: gag intact, uvula midline CN XI: sternocleidomastoid and trapezius muscles intact CN XII: tongue midline Bulk & Tone: normal, no fasciculations. Motor:  muscle strength 5-/5 upper extremities and bilateral hip flexion.  Otherwise, 5/5 Sensation:  Pinprick sensation reduced in hands and feet vibratory sensation intact. Deep Tendon Reflexes:  2+ throughout,  toes downgoing.   Finger to nose testing:  Without dysmetria.     Gait:  Broad-based gait.  Requires walker to ambulate.  Romberg with sway.    Thank you for allowing me to take part in the care of this patient.  Shon Millet, DO  CC: [name], MD

## 2021-04-03 ENCOUNTER — Other Ambulatory Visit: Payer: Self-pay

## 2021-04-03 ENCOUNTER — Encounter: Payer: Self-pay | Admitting: Neurology

## 2021-04-03 ENCOUNTER — Ambulatory Visit: Payer: Medicare HMO | Admitting: Neurology

## 2021-04-03 VITALS — BP 151/71 | HR 88 | Ht 60.0 in | Wt 158.2 lb

## 2021-04-03 DIAGNOSIS — G450 Vertebro-basilar artery syndrome: Secondary | ICD-10-CM

## 2021-04-03 DIAGNOSIS — R29898 Other symptoms and signs involving the musculoskeletal system: Secondary | ICD-10-CM | POA: Diagnosis not present

## 2021-04-03 DIAGNOSIS — W19XXXS Unspecified fall, sequela: Secondary | ICD-10-CM | POA: Diagnosis not present

## 2021-04-03 DIAGNOSIS — E538 Deficiency of other specified B group vitamins: Secondary | ICD-10-CM | POA: Diagnosis not present

## 2021-04-03 NOTE — Patient Instructions (Signed)
Check CTA of head and neck Continue B12 injections Consider physical therapy Follow up in 4 months.  Repeat B12 a week prior to follow up.

## 2021-04-04 ENCOUNTER — Ambulatory Visit (INDEPENDENT_AMBULATORY_CARE_PROVIDER_SITE_OTHER): Payer: Medicare HMO

## 2021-04-04 DIAGNOSIS — E538 Deficiency of other specified B group vitamins: Secondary | ICD-10-CM

## 2021-04-04 DIAGNOSIS — I10 Essential (primary) hypertension: Secondary | ICD-10-CM

## 2021-04-04 DIAGNOSIS — I350 Nonrheumatic aortic (valve) stenosis: Secondary | ICD-10-CM

## 2021-04-04 DIAGNOSIS — E782 Mixed hyperlipidemia: Secondary | ICD-10-CM

## 2021-04-04 NOTE — Progress Notes (Signed)
Chronic Care Management Pharmacy Note  04/04/2021 Name:  [name] MRN:  [id number] DOB:  [birth year]   Plan Recommendations:  Patient's daughter feels recent dizziness due to Hypotension but has no BP readings, they'll test daily and CPP will f/u 1 month Onboard to [facility] to ease organizational burden on patient's daughter. Asked front staff to send in scripts Dc'd Gabapentin per patient's wishes, it causes dizziness  Subjective: [name] is an 81 y.o. year old female who is a primary patient of Henrene Pastor, Zeb Comfort, MD.  The CCM team was consulted for assistance with disease management and care coordination needs.    Engaged with patient by telephone for follow up visit in response to provider referral for pharmacy case management and/or care coordination services.   Consent to Services:  The patient was given the following information about Chronic Care Management services today, agreed to services, and gave verbal consent: 1. CCM service includes personalized support from designated clinical staff supervised by the primary care provider, including individualized plan of care and coordination with other care providers 2. 24/7 contact phone numbers for assistance for urgent and routine care needs. 3. Service will only be billed when office clinical staff spend 20 minutes or more in a month to coordinate care. 4. Only one practitioner may furnish and bill the service in a calendar month. 5.The patient may stop CCM services at any time (effective at the end of the month) by phone call to the office staff. 6. The patient will be responsible for cost sharing (co-pay) of up to 20% of the service fee (after annual deductible is met). Patient agreed to services and consent obtained.  Patient Care Team: Lillard Anes, MD as PCP - General (Family Medicine) Josue Hector, MD as Consulting Physician (Cardiology) Lane Hacker, [facility] (Pharmacist)  Recent office  visits: 05/15/2020 - Glucose 104, kidney tests stable stage 3a, liver tests normal, triglycerides high 180 and LDL cholesterol high at 105, TSH 2.55 normal, cbc normal, vitamin d low 5.3, I will call in Rx. 01/12/2020 - CBC normal, glucose 102, kidney tests remain stage 3, liver tests normal, triglycerides 217 high and LDL cholesterol 213- very high suggest cardiology consult to assist in lipid lowering with history of vascular disease  Recent consult visits: 01/25/2020 - cardiology pharmacist - patient assistance for pass. Can consider adding zetia or rosuvastatin to Praluent. Patient should only be on clopidogrel >3 months out of stroke not aspirin.  01/14/2020 - cardiology - f/u with neurology. Intolerant to statins. Refer to lipid clinic to start PSK9 if not cost prohibitive.   Hospital visits: None in previous 6 months  Objective:  Lab Results  Component Value Date   CREATININE 1.12 (H) 03/14/2021   BUN 18 03/14/2021   GFRNONAA 51 (L) 05/15/2020   GFRAA 59 (L) 05/15/2020   NA 143 03/14/2021   K 4.1 03/14/2021   CALCIUM 9.5 03/14/2021   CO2 21 03/14/2021    Lab Results  Component Value Date/Time   HGBA1C 5.6 08/01/2015 02:43 AM   HGBA1C 5.8 (H) 06/09/2015 11:59 PM    Last diabetic Eye exam: No results found for: HMDIABEYEEXA  Last diabetic Foot exam: No results found for: HMDIABFOOTEX   Lab Results  Component Value Date   CHOL 152 03/14/2021   HDL 33 (L) 03/14/2021   LDLCALC 71 03/14/2021   TRIG 300 (H) 03/14/2021   CHOLHDL 4.6 (H) 03/14/2021    Hepatic Function Latest Ref Rng & Units 03/14/2021  02/06/2021 09/12/2020  Total Protein 6.0 - 8.5 g/dL 7.0 6.7 6.7  Albumin 3.7 - 4.7 g/dL 4.1 4.2 4.1  AST 0 - 40 IU/L 14 13 12   ALT 0 - 32 IU/L 10 12 1   Alk Phosphatase 44 - 121 IU/L 118 123(H) 128(H)  Total Bilirubin 0.0 - 1.2 mg/dL 0.4 0.2 0.3  Bilirubin, Direct 0.1 - 0.5 mg/dL - - -    Lab Results  Component Value Date/Time   TSH 2.850 03/14/2021 10:52 AM   TSH  2.550 05/15/2020 10:31 AM    CBC Latest Ref Rng & Units 03/14/2021 02/06/2021 09/12/2020  WBC 3.4 - 10.8 x10E3/uL 3.8 6.4 6.1  Hemoglobin 11.1 - 15.9 g/dL 12.9 12.6 12.5  Hematocrit 34.0 - 46.6 % 39.9 38.7 39.1  Platelets 150 - 450 x10E3/uL 282 325 346    Lab Results  Component Value Date/Time   VD25OH 31.6 09/12/2020 10:24 AM   VD25OH 5.3 (L) 05/15/2020 10:31 AM    Clinical ASCVD: Yes  The ASCVD Risk score (Arnett DK, et al., 2019) failed to calculate for the following reasons:   The 2019 ASCVD risk score is only valid for ages 51 to 42   The patient has a prior MI or stroke diagnosis    Depression screen [facility] 2/9 02/06/2021 12/18/2020 09/12/2020  Decreased Interest 1 3 1   Down, Depressed, Hopeless 3 0 1  PHQ - 2 Score 4 3 2   Altered sleeping 3 3 3   Tired, decreased energy 3 3 3   Change in appetite 0 2 0  Feeling bad or failure about yourself  0 0 0  Trouble concentrating 0 0 0  Moving slowly or fidgety/restless 2 0 0  Suicidal thoughts 0 0 0  PHQ-9 Score 12 11 8   Difficult doing work/chores Not difficult at all Somewhat difficult Not difficult at all  Some recent data might be hidden     Social History   Tobacco Use  Smoking Status Never  Smokeless Tobacco Never   BP Readings from Last 3 Encounters:  04/03/21 (!) 151/71  03/14/21 130/78  02/06/21 (!) 154/80   Pulse Readings from Last 3 Encounters:  04/03/21 88  03/14/21 81  02/06/21 73   Wt Readings from Last 3 Encounters:  04/03/21 158 lb 3.2 oz (71.8 kg)  03/14/21 159 lb 12.8 oz (72.5 kg)  02/06/21 159 lb (72.1 kg)    Assessment/Interventions: Review of patient past medical history, allergies, medications, health status, including review of consultants reports, laboratory and other test data, was performed as part of comprehensive evaluation and provision of chronic care management services.   SDOH:  (Social Determinants of Health) assessments and interventions performed: Yes SDOH Interventions     Flowsheet Row Most Recent Value  SDOH Interventions   Financial Strain Interventions Intervention Not Indicated       CCM Care Plan  Allergies  Allergen Reactions   Codeine Nausea And Vomiting    Nausea    Meclizine Other (See Comments)    Lethargy, extreme sleepiness   Requip [Ropinirole Hcl] Nausea And Vomiting   Statins     Muscle weakness    Medications Reviewed Today     Reviewed by Lane Hacker, [facility] (Pharmacist) on 04/04/21 at [place] List Status: <None>   Medication Order Taking? Sig Documenting Provider Last Dose Status Informant  Acetaminophen (TYLENOL) 325 MG CAPS [id number] Yes Take 650 mg by mouth every 6 (six) hours as needed. [provider] Taking Active   Alirocumab ([place])  75 MG/ML SOAJ [id number] Yes INJECT 1 PEN INTO THE SKIN EVERY 14 (FOURTEEN) DAYS. Josue Hector, MD Taking Active   amLODipine (NORVASC) 10 MG tablet [id number] Yes TAKE 1 TABLET BY MOUTH EVERY DAY Lillard Anes, MD Taking Active   Cholecalciferol 1.25 MG (50000 UT) capsule [id number] Yes Take 1 capsule (50,000 Units total) by mouth once a week. Lillard Anes, MD Taking Active   clopidogrel (PLAVIX) 75 MG tablet [id number] Yes TAKE 1 TABLET BY MOUTH EVERY DAY Lillard Anes, MD Taking Active   cyanocobalamin (,VITAMIN B-12,) 1000 MCG/ML injection [id number] Yes Inject 1 mL (1,000 mcg total) into the muscle every 30 (thirty) days. Lillard Anes, MD Taking Active   furosemide (LASIX) 20 MG tablet [id number] Yes Take 1 tablet (20 mg total) by mouth daily. Taking every day for swelling Lillard Anes, MD Taking Active   gabapentin (NEURONTIN) 100 MG capsule [id number] No Take 1 capsule (100 mg total) by mouth 3 (three) times daily.  Patient not taking: Reported on 04/04/2021   Lillard Anes, MD Not Taking Consider Medication Status and Discontinue   latanoprost (XALATAN) 0.005 % ophthalmic solution [id number] No 1 drop at bedtime.   Patient not taking: Reported on 04/04/2021   [provider] Not Taking Active   loratadine (CLARITIN) 10 MG tablet [id number] No Take 10 mg by mouth daily.  Patient not taking: Reported on 04/04/2021   [provider] Not Taking Active   pantoprazole (PROTONIX) 40 MG tablet [id number]  TAKE 1 TABLET BY MOUTH TWICE A DAY Lillard Anes, MD  Active   valsartan (DIOVAN) 320 MG tablet [id number] Yes TAKE 1 TABLET BY MOUTH EVERY DAY Lillard Anes, MD Taking Active   vortioxetine HBr (TRINTELLIX) 10 MG TABS tablet [id number] Yes Take 1 tablet (10 mg total) by mouth daily. Lillard Anes, MD Taking Active             Patient Active Problem List   Diagnosis Date Noted   Incisional infection 12/25/2020   Visit for suture removal 12/25/2020   History of stroke with residual effects 05/15/2020   Heat exhaustion 11/04/2019   CKD (chronic kidney disease), stage III (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   Care Plan : [place]  Updates  made by Lane Hacker, RPH since 04/04/2021 12:00 AM     Problem: pvd, htn, hld, depression   Priority: High  Onset Date: 06/13/2020     Long-Range Goal: Disease Management   Start Date: 06/13/2020  Expected End Date: 06/13/2021  Recent Progress: On track  Priority: High  Note:   Current Barriers:  Unable to independently afford treatment regimen Unable to achieve control of depression   Pharmacist Clinical Goal(s):  Patient will verbalize ability to afford treatment regimen achieve control of depression  as evidenced by improved PHQ-9 through collaboration with PharmD and provider.   Interventions: 1:1 collaboration with Lillard Anes, MD regarding development and update of comprehensive plan of care as evidenced by provider attestation and co-signature Inter-disciplinary care team collaboration (see longitudinal plan of care) Comprehensive medication review performed; medication list updated in electronic medical record  Hypertension (BP goal [phone] systolic per Neuro 10/11/92) BP Readings from Last 3 Encounters:  04/03/21 (!) 151/71  03/14/21 130/78  02/06/21 (!) 154/80  -Controlled -Current treatment: amlodipine 10 mg daily Query Appropriate, Query effective, Query Safe, Accessible Valsartan 325 mg daily Query Appropriate, Query effective, Query Safe, Accessible -Medications previously tried: none reported  -Current home readings:  not checking  -Current dietary habits:  limits sweets. Eats frozen dinners. Doesn't follow a heart healthy diet.  -Current exercise habits:  walks around yard and house. Has a bad knee that limits.  -Denies hypotensive/hypertensive symptoms -Educated on BP goals and benefits of medications for prevention of heart attack, stroke and kidney damage; Daily salt intake goal < 2300 mg; Exercise goal of 150 minutes per week; -Counseled to monitor BP at home as needed, document, and provide log at future appointments -Counseled on diet and  exercise extensively Jan 2023: Daughter, Santiago Glad, is convinced patient's lethargy and imbalance is due to hypotension. Neuro stated Vit B12 is low and is most likely the cause Plan: Will test BP daily to determine orthostasis. Will take Amlodipine AM and Valsartan PM to help as well  Hyperlipidemia/PVD: (LDL goal < 55) The ASCVD Risk score (Arnett DK, et al., 2019) failed to calculate for the following reasons:   The 2019 ASCVD risk score is only valid for ages 76 to 45   The patient has a prior MI or stroke diagnosis Lab Results  Component Value Date   CHOL 152 03/14/2021   CHOL 192 09/12/2020   CHOL 177 05/15/2020   Lab Results  Component Value Date   HDL 33 (L) 03/14/2021   HDL 40 09/12/2020   HDL 41 05/15/2020   Lab Results  Component Value Date   LDLCALC 71 03/14/2021   LDLCALC 109 (H) 09/12/2020   LDLCALC 105 (H) 05/15/2020   Lab Results  Component Value Date   TRIG 300 (H) 03/14/2021   TRIG 252 (H) 09/12/2020   TRIG 180 (H) 05/15/2020   Lab Results  Component Value Date   CHOLHDL 4.6 (H) 03/14/2021   CHOLHDL 4.8 (H) 09/12/2020   CHOLHDL 4.3 05/15/2020  No results found for: LDLDIRECT -Not ideally controlled -Current treatment: Praluent 75 mg/ml injection every 14 days Appropriate, Effective, Safe, Accessible Clopidogrel 75 mg daily Appropriate, Effective, Safe, Accessible -Medications previously tried: statins -Current dietary patterns:  cooks at home. Eats some frozen dinners. Banana sandwich, Cheese sandwich. Daughter brings leftovers for meals. Trying to watch sweets. Drinking water with Crystal [employer] package.  -Current exercise habits: walks around yard and house. Has a bad knee.  -Educated on Cholesterol goals;  Importance of limiting foods high in cholesterol; Exercise goal of 150 minutes per week; -Counseled on diet and exercise extensively Recommended to continue current medication. Pharmacist consulted with [facility] to confirm refill of Praluent. Patient  aware.   GERD (Goal: manage symptoms and avoid triggers) -Controlled -Current treatment  Pantoprazole 40 mg PRN (Patient truly taking PRN) Appropriate, Effective, Safe, Accessible -  Medications previously tried: none reported  -Counseled on diet and exercise extensively Recommended to continue current medication Educated on avoiding triggers, eating smaller meals, elevating head during rest/sleep.   Depression/Anxiety (Goal: improve depression/PHQ9) -Not ideally controlled -Current treatment: Trintellix 27m QD Appropriate, Effective, Safe, Query accessible -Medications previously tried/failed: sertraline  -PHQ9: 7 -Educated on Benefits of medication for symptom control Benefits of cognitive-behavioral therapy with or without medication -Counseled on exercise as a way to improved depression. Discussed gratitude journaling. Patient works to focus on what she is thankful for.  Jan 2023: Is expensive for patient, will do PAP  Osteoporosis / Osteopenia (Goal reduce risk of fracture) -Not ideally controlled - unable to assess since patient declines updated Dexa -Last DEXA Scan:  none in record - last date indicated 08/08/2005.  -Current treatment  Vitamin D 50,000 weekly Appropriate, Effective, Safe, Accessible -Medications previously tried: none reported  -Recommend [phone] units of vitamin D daily. Recommend 1200 mg of calcium daily from dietary and supplemental sources. Recommend weight-bearing and muscle strengthening exercises for building and maintaining bone density. -Counseled on diet and exercise extensively Recommended consider Dexa scan update. Patient declines due to age. Discussed eating a diet rich in calcium through milk, cheese and dark, leafy green vegetables.   Jan 2023: Patient hasn't been taking, explained how to take, when, and why    Patient Goals/Self-Care Activities Patient will:  - take medications as prescribed focus on medication adherence by using pill  box target a minimum of 150 minutes of moderate intensity exercise weekly engage in dietary modifications by limiting sodium and working on heart healthy diet.   Follow Up Plan: Telephone follow up appointment with care management team member scheduled for: Feb 2023  NArizona Constable PFloridaD. -- [phone]      Medication Assistance:  Praluent obtained through PASS medication assistance program.  Enrollment ends 03/24/2022  [place]  [place] [address]Phone: [phone]: [phone]  [id number] [id number]: [phone]: [phone] pill box? Yes  Pt endorses good compliance  We discussed: Benefits of medication synchronization, packaging and delivery as well as enhanced pharmacist oversight with [facility]. Patient decided to: Utilize [facility] pharmacy for medication synchronization, packaging and delivery Verbal consent obtained for [facility] enhanced pharmacy services (medication synchronization, adherence packaging, delivery coordination). A medication sync plan was created to allow patient to get all medications delivered once every 30 to 90 days per patient preference. Patient understands they have freedom to choose pharmacy and clinical pharmacist will coordinate care between all prescribers and [facility].  Medication Name                        (please note if Rx is PRN) Prescriber                                                                  (list Provider Name & Phone Number)  Timing    Refill Timing Last Fill Date & DS       (if last fill/DS unavailable, list pt.'s quantity on hand) Anticipated next due date    BB B L EM BT   Days Supply   Amlodipine 60m QD [place] - [phone]  1    Cycle Fill  03/14/2021 90.00 06/12/21  Cholecalciferol 1.219mweek (Tuesday) [place] - [phone]    Cycle Fill 03/14/2021 90.00 06/12/21  Clopidogrel 7590mD [place] - [phone]    Cycle Fill 01/19/2021 90.00 04/19/21  Furosemide 44m35mN [place] - [phone]  PRN 01/19/2021 90.00 05/06/21  Pantoprazole 40mg57m PerryHenrene [phone] PRN   01/00/00  Valsartan 344mg 35merry Henrene Pastor-[phone] Cycle Fill 02/05/2021 90.00 05/06/21  Trintellix 10mg Q55mrry -Henrene [phone] Fill 03/19/2021 30.00 04/18/21            01/00/00  Tylenol PRN 325mg Pe57m- Henrene Pastor-[phone]       Care Plan and Follow Up Patient Decision:  Patient agrees to Care Plan and Follow-up.  Plan: Telephone follow up appointment with care management team member scheduled for:  Feb 2023

## 2021-04-04 NOTE — Patient Instructions (Signed)
Visit Information   Goals Addressed   None    Patient Care Plan: CCM Pharmacy Care Plan     Problem Identified: pvd, htn, hld, depression   Priority: High  Onset Date: 06/13/2020     Long-Range Goal: Disease Management   Start Date: 06/13/2020  Expected End Date: 06/13/2021  Recent Progress: On track  Priority: High  Note:   Current Barriers:  Unable to independently afford treatment regimen Unable to achieve control of depression   Pharmacist Clinical Goal(s):  Patient will verbalize ability to afford treatment regimen achieve control of depression  as evidenced by improved PHQ-9 through collaboration with PharmD and provider.   Interventions: 1:1 collaboration with [name], MD regarding development and update of comprehensive plan of care as evidenced by provider attestation and co-signature Inter-disciplinary care team collaboration (see longitudinal plan of care) Comprehensive medication review performed; medication list updated in electronic medical record  Hypertension (BP goal 130-150 systolic per Neuro 04/03/21) BP Readings from Last 3 Encounters:  04/03/21 (!) 151/71  03/14/21 130/78  02/06/21 (!) 154/80  -Controlled -Current treatment: amlodipine 10 mg daily Query Appropriate, Query effective, Query Safe, Accessible Valsartan 325 mg daily Query Appropriate, Query effective, Query Safe, Accessible -Medications previously tried: none reported  -Current home readings:  not checking  -Current dietary habits:  limits sweets. Eats frozen dinners. Doesn't follow a heart healthy diet.  -Current exercise habits:  walks around yard and house. Has a bad knee that limits.  -Denies hypotensive/hypertensive symptoms -Educated on BP goals and benefits of medications for prevention of heart attack, stroke and kidney damage; Daily salt intake goal < 2300 mg; Exercise goal of 150 minutes per week; -Counseled to monitor BP at home as needed, document, and provide  log at future appointments -Counseled on diet and exercise extensively Jan 2023: Daughter, [name], is convinced patient's lethargy and imbalance is due to hypotension. Neuro stated Vit B12 is low and is most likely the cause Plan: Will test BP daily to determine orthostasis. Will take Amlodipine AM and Valsartan PM to help as well  Hyperlipidemia/PVD: (LDL goal < 55) The ASCVD Risk score (Arnett DK, et al., 2019) failed to calculate for the following reasons:   The 2019 ASCVD risk score is only valid for ages 47 to 42   The patient has a prior MI or stroke diagnosis Lab Results  Component Value Date   CHOL 152 03/14/2021   CHOL 192 09/12/2020   CHOL 177 05/15/2020   Lab Results  Component Value Date   HDL 33 (L) 03/14/2021   HDL 40 09/12/2020   HDL 41 05/15/2020   Lab Results  Component Value Date   LDLCALC 71 03/14/2021   LDLCALC 109 (H) 09/12/2020   LDLCALC 105 (H) 05/15/2020   Lab Results  Component Value Date   TRIG 300 (H) 03/14/2021   TRIG 252 (H) 09/12/2020   TRIG 180 (H) 05/15/2020   Lab Results  Component Value Date   CHOLHDL 4.6 (H) 03/14/2021   CHOLHDL 4.8 (H) 09/12/2020   CHOLHDL 4.3 05/15/2020  No results found for: LDLDIRECT -Not ideally controlled -Current treatment: Praluent 75 mg/ml injection every 14 days Appropriate, Effective, Safe, Accessible Clopidogrel 75 mg daily Appropriate, Effective, Safe, Accessible -Medications previously tried: statins -Current dietary patterns:  cooks at home. Eats some frozen dinners. Banana sandwich, Cheese sandwich. Daughter brings leftovers for meals. Trying to watch sweets. Drinking water with [employer] package.  -Current exercise habits: walks around yard and house. Has a  bad knee.  -Educated on Cholesterol goals;  Importance of limiting foods high in cholesterol; Exercise goal of 150 minutes per week; -Counseled on diet and exercise extensively Recommended to continue current medication. Pharmacist consulted  with [facility] to confirm refill of Praluent. Patient aware.   GERD (Goal: manage symptoms and avoid triggers) -Controlled -Current treatment  Pantoprazole 40 mg PRN (Patient truly taking PRN) Appropriate, Effective, Safe, Accessible -Medications previously tried: none reported  -Counseled on diet and exercise extensively Recommended to continue current medication Educated on avoiding triggers, eating smaller meals, elevating head during rest/sleep.   Depression/Anxiety (Goal: improve depression/PHQ9) -Not ideally controlled -Current treatment: Trintellix 10mg  QD Appropriate, Effective, Safe, Query accessible -Medications previously tried/failed: sertraline  -PHQ9: 7 -Educated on Benefits of medication for symptom control Benefits of cognitive-behavioral therapy with or without medication -Counseled on exercise as a way to improved depression. Discussed gratitude journaling. Patient works to focus on what she is thankful for.  Jan 2023: Is expensive for patient, will do PAP  Osteoporosis / Osteopenia (Goal reduce risk of fracture) -Not ideally controlled - unable to assess since patient declines updated Dexa -Last DEXA Scan:  none in record - last date indicated 08/08/2005.  -Current treatment  Vitamin D 50,000 weekly Appropriate, Effective, Safe, Accessible -Medications previously tried: none reported  -Recommend [phone] units of vitamin D daily. Recommend 1200 mg of calcium daily from dietary and supplemental sources. Recommend weight-bearing and muscle strengthening exercises for building and maintaining bone density. -Counseled on diet and exercise extensively Recommended consider Dexa scan update. Patient declines due to age. Discussed eating a diet rich in calcium through milk, cheese and dark, leafy green vegetables.   Jan 2023: Patient hasn't been taking, explained how to take, when, and why    Patient Goals/Self-Care Activities Patient will:  - take medications as  prescribed focus on medication adherence by using pill box target a minimum of 150 minutes of moderate intensity exercise weekly engage in dietary modifications by limiting sodium and working on heart healthy diet.   Follow Up Plan: Telephone follow up appointment with care management team member scheduled for: Feb 2023  Mar 2023, Artelia Laroche.D. - [phone]       The patient verbalized understanding of instructions, educational materials, and care plan provided today and declined offer to receive copy of patient instructions, educational materials, and care plan.  The pharmacy team will reach out to the patient again over the next 30 days.   02-22-1990, [facility]

## 2021-04-10 ENCOUNTER — Telehealth: Payer: Self-pay

## 2021-04-10 NOTE — Progress Notes (Signed)
° ° °  Chronic Care Management Pharmacy Assistant   Name: [name]  MRN: [id number] DOB: 04-17-1940  Reason for Encounter: PAP    Medications: Outpatient Encounter Medications as of 04/10/2021  Medication Sig   Acetaminophen (TYLENOL) 325 MG CAPS Take 650 mg by mouth every 6 (six) hours as needed.   Alirocumab (PRALUENT) 75 MG/ML SOAJ INJECT 1 PEN INTO THE SKIN EVERY 14 (FOURTEEN) DAYS.   amLODipine (NORVASC) 10 MG tablet TAKE 1 TABLET BY MOUTH EVERY DAY   Cholecalciferol 1.25 MG (50000 UT) capsule Take 1 capsule (50,000 Units total) by mouth once a week.   clopidogrel (PLAVIX) 75 MG tablet TAKE 1 TABLET BY MOUTH EVERY DAY   cyanocobalamin (,VITAMIN B-12,) 1000 MCG/ML injection Inject 1 mL (1,000 mcg total) into the muscle every 30 (thirty) days.   furosemide (LASIX) 20 MG tablet Take 1 tablet (20 mg total) by mouth daily. Taking every day for swelling   gabapentin (NEURONTIN) 100 MG capsule Take 1 capsule (100 mg total) by mouth 3 (three) times daily. (Patient not taking: Reported on 04/04/2021)   latanoprost (XALATAN) 0.005 % ophthalmic solution 1 drop at bedtime. (Patient not taking: Reported on 04/04/2021)   loratadine (CLARITIN) 10 MG tablet Take 10 mg by mouth daily. (Patient not taking: Reported on 04/04/2021)   pantoprazole (PROTONIX) 40 MG tablet TAKE 1 TABLET BY MOUTH TWICE A DAY   valsartan (DIOVAN) 320 MG tablet TAKE 1 TABLET BY MOUTH EVERY DAY   vortioxetine HBr (TRINTELLIX) 10 MG TABS tablet Take 1 tablet (10 mg total) by mouth daily.   No facility-administered encounter medications on file as of 04/10/2021.    04/10/2021 Patient Assistant Application for Trintellix mailed to patient. Patient called voicemail left to sign application and bring back to office.   [place] Pharmacist Assistant [phone]

## 2021-04-12 ENCOUNTER — Telehealth: Payer: Self-pay

## 2021-04-12 ENCOUNTER — Other Ambulatory Visit: Payer: Self-pay

## 2021-04-12 DIAGNOSIS — R5383 Other fatigue: Secondary | ICD-10-CM

## 2021-04-12 DIAGNOSIS — F32A Depression, unspecified: Secondary | ICD-10-CM

## 2021-04-12 DIAGNOSIS — F331 Major depressive disorder, recurrent, moderate: Secondary | ICD-10-CM

## 2021-04-12 DIAGNOSIS — E559 Vitamin D deficiency, unspecified: Secondary | ICD-10-CM

## 2021-04-12 DIAGNOSIS — I10 Essential (primary) hypertension: Secondary | ICD-10-CM

## 2021-04-12 MED ORDER — AMLODIPINE BESYLATE 10 MG PO TABS: 10.0000 mg | ORAL_TABLET | Freq: Every day | ORAL | 1 refills | Status: DC

## 2021-04-12 MED ORDER — CLOPIDOGREL BISULFATE 75 MG PO TABS: 75.0000 mg | ORAL_TABLET | Freq: Every day | ORAL | 1 refills | Status: DC

## 2021-04-12 MED ORDER — VORTIOXETINE HBR 10 MG PO TABS: 10.0000 mg | ORAL_TABLET | Freq: Every day | ORAL | 1 refills | Status: DC

## 2021-04-12 MED ORDER — FUROSEMIDE 20 MG PO TABS: 20.0000 mg | ORAL_TABLET | Freq: Every day | ORAL | 1 refills | Status: DC

## 2021-04-12 MED ORDER — VALSARTAN 320 MG PO TABS: 320.0000 mg | ORAL_TABLET | Freq: Every day | ORAL | 1 refills | Status: DC

## 2021-04-12 MED ORDER — CHOLECALCIFEROL 1.25 MG (50000 UT) PO CAPS: 50000.0000 [IU] | ORAL_CAPSULE | ORAL | 3 refills | Status: DC

## 2021-04-12 NOTE — Chronic Care Management (AMB) (Signed)
° ° °  Chronic Care Management Pharmacy Assistant   Name: [name]  MRN: [id number] DOB: 04-24-1940  Reason for Encounter: Medication Coordination   04/12/2021- Request sent to [facility] for 90 days supply refill on Trintellix 10 mg, Clopidogrel 75 mg for delivery on 04/18/2021 to [facility]. Also requested Furosemide 20 mg, Valsartan 320 mg, Amlodipine 10 mg and Cholecalciferol 1.25 mg to be sent and placed on hold at [facility] until scheduled delivery on 05/06/21 and 06/12/21.  Medications: Outpatient Encounter Medications as of 04/12/2021  Medication Sig   Acetaminophen (TYLENOL) 325 MG CAPS Take 650 mg by mouth every 6 (six) hours as needed.   Alirocumab (PRALUENT) 75 MG/ML SOAJ INJECT 1 PEN INTO THE SKIN EVERY 14 (FOURTEEN) DAYS.   amLODipine (NORVASC) 10 MG tablet TAKE 1 TABLET BY MOUTH EVERY DAY   Cholecalciferol 1.25 MG (50000 UT) capsule Take 1 capsule (50,000 Units total) by mouth once a week.   clopidogrel (PLAVIX) 75 MG tablet TAKE 1 TABLET BY MOUTH EVERY DAY   cyanocobalamin (,VITAMIN B-12,) 1000 MCG/ML injection Inject 1 mL (1,000 mcg total) into the muscle every 30 (thirty) days.   furosemide (LASIX) 20 MG tablet Take 1 tablet (20 mg total) by mouth daily. Taking every day for swelling   gabapentin (NEURONTIN) 100 MG capsule Take 1 capsule (100 mg total) by mouth 3 (three) times daily. (Patient not taking: Reported on 04/04/2021)   latanoprost (XALATAN) 0.005 % ophthalmic solution 1 drop at bedtime. (Patient not taking: Reported on 04/04/2021)   loratadine (CLARITIN) 10 MG tablet Take 10 mg by mouth daily. (Patient not taking: Reported on 04/04/2021)   pantoprazole (PROTONIX) 40 MG tablet TAKE 1 TABLET BY MOUTH TWICE A DAY   valsartan (DIOVAN) 320 MG tablet TAKE 1 TABLET BY MOUTH EVERY DAY   vortioxetine HBr (TRINTELLIX) 10 MG TABS tablet Take 1 tablet (10 mg total) by mouth daily.   No facility-administered encounter medications on file as of 04/12/2021.    Billee Cashing, CMA Clinical Pharmacist Assistant [phone]

## 2021-04-14 ENCOUNTER — Other Ambulatory Visit: Payer: Self-pay | Admitting: Legal Medicine

## 2021-04-14 DIAGNOSIS — E559 Vitamin D deficiency, unspecified: Secondary | ICD-10-CM

## 2021-04-19 ENCOUNTER — Ambulatory Visit
Admission: RE | Admit: 2021-04-19 | Discharge: 2021-04-19 | Disposition: A | Discharge status: Home / Self Care | Payer: Medicare HMO | Source: Ambulatory Visit | Attending: Neurology | Admitting: Neurology

## 2021-04-19 DIAGNOSIS — G450 Vertebro-basilar artery syndrome: Secondary | ICD-10-CM

## 2021-04-19 DIAGNOSIS — R531 Weakness: Secondary | ICD-10-CM | POA: Diagnosis not present

## 2021-04-19 MED ORDER — IOPAMIDOL (ISOVUE-370) INJECTION 76%
75.0000 mL | Freq: Once | INTRAVENOUS | Status: AC | PRN
  Administered 2021-04-19: 75 mL via INTRAVENOUS

## 2021-04-20 ENCOUNTER — Other Ambulatory Visit: Payer: Self-pay | Admitting: Legal Medicine

## 2021-04-20 ENCOUNTER — Other Ambulatory Visit: Payer: Self-pay

## 2021-04-20 ENCOUNTER — Ambulatory Visit (INDEPENDENT_AMBULATORY_CARE_PROVIDER_SITE_OTHER): Payer: Medicare HMO

## 2021-04-20 DIAGNOSIS — E538 Deficiency of other specified B group vitamins: Secondary | ICD-10-CM | POA: Diagnosis not present

## 2021-04-20 DIAGNOSIS — F321 Major depressive disorder, single episode, moderate: Secondary | ICD-10-CM

## 2021-04-20 MED ORDER — CYANOCOBALAMIN 1000 MCG/ML IJ SOLN
1000.0000 ug | Freq: Once | INTRAMUSCULAR | Status: AC
  Administered 2021-04-20: 1000 ug via INTRAMUSCULAR

## 2021-04-20 NOTE — Progress Notes (Signed)
° °  B12 injection given per order, patient tolerated well.   [name], LPN 75:10 AM

## 2021-04-23 ENCOUNTER — Telehealth: Payer: Self-pay | Admitting: Neurology

## 2021-04-23 DIAGNOSIS — I671 Cerebral aneurysm, nonruptured: Secondary | ICD-10-CM

## 2021-04-23 DIAGNOSIS — I651 Occlusion and stenosis of basilar artery: Secondary | ICD-10-CM

## 2021-04-23 DIAGNOSIS — I739 Peripheral vascular disease, unspecified: Secondary | ICD-10-CM

## 2021-04-23 DIAGNOSIS — I639 Cerebral infarction, unspecified: Secondary | ICD-10-CM

## 2021-04-23 DIAGNOSIS — I6503 Occlusion and stenosis of bilateral vertebral arteries: Secondary | ICD-10-CM

## 2021-04-23 NOTE — Telephone Encounter (Signed)
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  I will refer her to vascular surgery for their opinion in regards to management.  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  Typically, I would have her add ASA 81mg  daily in addition to the Plavix but will hold off for now given the aneurysm.  I answered all questions to the best of my ability

## 2021-04-23 NOTE — Telephone Encounter (Signed)
Patients daughter called, said her mother missed a call and they are unsure what it is about.

## 2021-04-23 NOTE — Addendum Note (Signed)
Addended by: Leida Lauth on: 04/23/2021 02:06 PM   Modules accepted: Orders

## 2021-04-24 ENCOUNTER — Other Ambulatory Visit (HOSPITAL_COMMUNITY): Payer: Self-pay | Admitting: Interventional Radiology

## 2021-04-24 DIAGNOSIS — E782 Mixed hyperlipidemia: Secondary | ICD-10-CM

## 2021-04-24 DIAGNOSIS — I671 Cerebral aneurysm, nonruptured: Secondary | ICD-10-CM

## 2021-04-24 DIAGNOSIS — I10 Essential (primary) hypertension: Secondary | ICD-10-CM | POA: Diagnosis not present

## 2021-04-25 ENCOUNTER — Other Ambulatory Visit: Payer: Self-pay

## 2021-04-25 ENCOUNTER — Ambulatory Visit (HOSPITAL_COMMUNITY)
Admission: RE | Admit: 2021-04-25 | Discharge: 2021-04-25 | Disposition: A | Discharge status: Home / Self Care | Payer: Medicare HMO | Source: Ambulatory Visit | Attending: Interventional Radiology | Admitting: Interventional Radiology

## 2021-04-25 DIAGNOSIS — I671 Cerebral aneurysm, nonruptured: Secondary | ICD-10-CM

## 2021-04-26 HISTORY — PX: IR RADIOLOGIST EVAL & MGMT: IMG5224

## 2021-05-02 ENCOUNTER — Encounter: Payer: Self-pay | Admitting: Vascular Surgery

## 2021-05-02 ENCOUNTER — Other Ambulatory Visit: Payer: Self-pay

## 2021-05-02 ENCOUNTER — Ambulatory Visit: Payer: Medicare HMO | Admitting: Vascular Surgery

## 2021-05-02 VITALS — BP 152/93 | HR 70 | Temp 98.3°F | Resp 20 | Ht 60.0 in | Wt 158.0 lb

## 2021-05-02 DIAGNOSIS — G450 Vertebro-basilar artery syndrome: Secondary | ICD-10-CM | POA: Diagnosis not present

## 2021-05-02 NOTE — Progress Notes (Signed)
Patient ID: [name], female   DOB: 02-Jun-1940, 81 y.o.   MRN: [id number]   Subjective:     HPI:  [name] is a 81 y.o. female with known history of vertebral artery stenosis previously noted on carotid duplex last March.  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  She has symptoms been evaluated by interventional radiology with discussion of diagnostic angiography.  She tells me that she just does not feel normal on the right side.  If she is sitting still she is okay but otherwise she has an uneasy feeling about her right side and feels unsteady particularly with walking.  She does continue to walk with the help of a walker.  She was recently switched from aspirin to Plavix.  Past Medical History:  Diagnosis Date   Benign paroxysmal vertigo, unspecified ear 06/07/2019   CKD (chronic kidney disease), stage III (HCC)    GERD (gastroesophageal reflux disease)    Glaucoma    Family History  Problem Relation Age of Onset   Hypertension Mother    Hypertension Father    Heart attack Father    Hypertension Brother    Diabetes Brother    Past Surgical History:  Procedure Laterality Date   ABDOMINAL HYSTERECTOMY     BACK SURGERY     cataract surgery     COLONOSCOPY N/A 08/03/2015   Procedure: COLONOSCOPY;  Surgeon: Carman Ching, MD;  Location: [facility] ENDOSCOPY;  Service: Endoscopy;  Laterality: N/A;   ESOPHAGOGASTRODUODENOSCOPY N/A 07/31/2015   Procedure: ESOPHAGOGASTRODUODENOSCOPY (EGD);  Surgeon: Carman Ching, MD;  Location: [facility] ENDOSCOPY;  Service: Endoscopy;  Laterality: N/A;   IR RADIOLOGIST EVAL & MGMT  04/26/2021   OOPHORECTOMY     TOTAL HIP ARTHROPLASTY     URETER SURGERY      Short Social History:  Social History   Tobacco Use   Smoking status: Never   Smokeless tobacco: Never  Substance Use  Topics   Alcohol use: No    Allergies  Allergen Reactions   Codeine Nausea And Vomiting    Nausea    Meclizine Other (See Comments)    Lethargy, extreme sleepiness   Requip [Ropinirole Hcl] Nausea And Vomiting   Statins     Muscle weakness    Current Outpatient Medications  Medication Sig Dispense Refill   Acetaminophen (TYLENOL) 325 MG CAPS Take 650 mg by mouth every 6 (six) hours as needed.     Alirocumab (PRALUENT) 75 MG/ML SOAJ INJECT 1 PEN INTO THE SKIN EVERY 14 (FOURTEEN) DAYS. 2 mL 11   amLODipine (NORVASC) 10 MG tablet Take 1 tablet (10 mg total) by mouth daily. 90 tablet 1   clopidogrel (PLAVIX) 75 MG tablet Take 1 tablet (75 mg total) by mouth daily. 90 tablet 1   cyanocobalamin (,VITAMIN B-12,) 1000 MCG/ML injection Inject 1 mL (1,000 mcg total) into the muscle every 30 (thirty) days. 30 mL 2   D3-50 1.25 MG (50000 UT) capsule TAKE 1 CAPSULE BY MOUTH ONE TIME PER WEEK 12 capsule 3   furosemide (LASIX) 20 MG tablet TAKE 1 TABLET (20 MG TOTAL) BY MOUTH DAILY. TAKING EVERY DAY FOR SWELLING 90 tablet 2   pantoprazole (PROTONIX) 40 MG tablet TAKE 1 TABLET BY MOUTH TWICE A DAY 180 tablet 2   valsartan (DIOVAN) 320 MG  tablet Take 1 tablet (320 mg total) by mouth daily. 90 tablet 1   vortioxetine HBr (TRINTELLIX) 10 MG TABS tablet Take 1 tablet (10 mg total) by mouth daily. 90 tablet 1   gabapentin (NEURONTIN) 100 MG capsule Take 1 capsule (100 mg total) by mouth 3 (three) times daily. (Patient not taking: Reported on 04/04/2021) 90 capsule 3   latanoprost (XALATAN) 0.005 % ophthalmic solution 1 drop at bedtime. (Patient not taking: Reported on 04/04/2021)     loratadine (CLARITIN) 10 MG tablet Take 10 mg by mouth daily. (Patient not taking: Reported on 04/04/2021)     No current facility-administered medications for this visit.    Review of Systems  Constitutional:  Constitutional negative. HENT: HENT negative.  Eyes: Eyes negative.  Respiratory: Respiratory negative.   Cardiovascular: Cardiovascular negative.  GI: Gastrointestinal negative.  Musculoskeletal: Positive for gait problem.  Neurological: Positive for dizziness, focal weakness and light-headedness.  Hematologic: Hematologic/lymphatic negative.  Psychiatric: Psychiatric negative.       Objective:  Objective  Vitals:   05/02/21 1034 05/02/21 1037  BP: (!) 198/76 (!) 152/93  Pulse: 70   Resp: 20   Temp: 98.3 F (36.8 C)   SpO2: 99%        Physical Exam HENT:     Nose: Nose normal.     Mouth/Throat:     Comments: Wearing a mask Eyes:     Pupils: Pupils are equal, round, and reactive to light.  Neck:     Vascular: Carotid bruit present.  Cardiovascular:     Rate and Rhythm: Normal rate.     Pulses:          Radial pulses are 2+ on the right side and 1+ on the left side.     Heart sounds: Murmur heard.  Pulmonary:     Effort: Pulmonary effort is normal.     Breath sounds: Normal breath sounds.  Abdominal:     General: Abdomen is flat.  Musculoskeletal:        General: Normal range of motion.     Right lower leg: No edema.     Left lower leg: No edema.  Lymphadenopathy:     Cervical: No cervical adenopathy.  Skin:    General: Skin is warm.     Capillary Refill: Capillary refill takes less than 2 seconds.  Neurological:     General: No focal deficit present.     Mental Status: She is alert.  Psychiatric:        Mood and Affect: Mood normal.        Behavior: Behavior normal.        Thought Content: Thought content normal.        Judgment: Judgment normal.    Data: CTA Head/Neck IMPRESSION: Severe and progressive atherosclerosis in the head and neck since 2017:   1. 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 High-grade narrowing in the bilateral cervical vertebrals which is progressed from 2017. 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 Fetal  type posterior cerebral arteries but progressive right P2 segment stenosis. 2. New moderate bilateral M1 segment stenoses. Branch atherosclerosis is extensive.   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 This aneurysm is notably lobulated 2. 2 mm at the right carotid terminus 3. 1 mm at the anterior communicating artery.   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  She has discussed with Dr. Corliss Skains and interventional radiology proceeding with angiography from either common femoral or radial approach.  I discussed with the patient and her daughter that this would be the next best step although it may be diagnostic only and could be that with this progression of disease there is no treatment option available from an endovascular approach.  It is also possible that her symptoms are not related to her progressive vertebrobasilar disease and that treating the underlying vascular disease may not improve her symptoms.  They are of good understanding, will discuss with the family and likely plan for angiography with interventional radiology in the near future.  She can see me on an as-needed basis     Maeola Harman MD [facility]

## 2021-05-04 ENCOUNTER — Ambulatory Visit (INDEPENDENT_AMBULATORY_CARE_PROVIDER_SITE_OTHER): Payer: Medicare HMO

## 2021-05-04 ENCOUNTER — Other Ambulatory Visit: Payer: Self-pay

## 2021-05-04 DIAGNOSIS — N1831 Chronic kidney disease, stage 3a: Secondary | ICD-10-CM

## 2021-05-04 DIAGNOSIS — E782 Mixed hyperlipidemia: Secondary | ICD-10-CM

## 2021-05-04 DIAGNOSIS — I10 Essential (primary) hypertension: Secondary | ICD-10-CM

## 2021-05-04 NOTE — Progress Notes (Addendum)
Chronic Care Management Pharmacy Note  05/04/2021 Name:  [name] MRN:  [id number] DOB:  1940/10/04   Plan Recommendations:  N/A, patient coming in next week to see PCP Sp[facility] with [facility] at [facility] to DC automatic refills  Subjective: [name] is an 81 y.o. year old female who is a primary patient of Henrene Pastor, Zeb Comfort, MD.  The CCM team was consulted for assistance with disease management and care coordination needs.    Engaged with patient by telephone for follow up visit in response to provider referral for pharmacy case management and/or care coordination services.   Consent to Services:  The patient was given the following information about Chronic Care Management services today, agreed to services, and gave verbal consent: 1. CCM service includes personalized support from designated clinical staff supervised by the primary care provider, including individualized plan of care and coordination with other care providers 2. 24/7 contact phone numbers for assistance for urgent and routine care needs. 3. Service will only be billed when office clinical staff spend 20 minutes or more in a month to coordinate care. 4. Only one practitioner may furnish and bill the service in a calendar month. 5.The patient may stop CCM services at any time (effective at the end of the month) by phone call to the office staff. 6. The patient will be responsible for cost sharing (co-pay) of up to 20% of the service fee (after annual deductible is met). Patient agreed to services and consent obtained.  Patient Care Team: Lillard Anes, MD as PCP - General (Family Medicine) Josue Hector, MD as Consulting Physician (Cardiology) Lane Hacker, [facility] (Pharmacist)  Recent office visits: 05/15/2020 - Glucose 104, kidney tests stable stage 3a, liver tests normal, triglycerides high 180 and LDL cholesterol high at 105, TSH 2.55 normal, cbc normal, vitamin d low 5.3, I will call in Rx. 01/12/2020 -  CBC normal, glucose 102, kidney tests remain stage 3, liver tests normal, triglycerides 217 high and LDL cholesterol 213- very high suggest cardiology consult to assist in lipid lowering with history of vascular disease  Recent consult visits: 01/25/2020 - cardiology pharmacist - patient assistance for pass. Can consider adding zetia or rosuvastatin to Praluent. Patient should only be on clopidogrel >3 months out of stroke not aspirin.  01/14/2020 - cardiology - f/u with neurology. Intolerant to statins. Refer to lipid clinic to start PSK9 if not cost prohibitive.   Hospital visits: None in previous 6 months  Objective:  Lab Results  Component Value Date   CREATININE 1.12 (H) 03/14/2021   BUN 18 03/14/2021   GFRNONAA 51 (L) 05/15/2020   GFRAA 59 (L) 05/15/2020   NA 143 03/14/2021   K 4.1 03/14/2021   CALCIUM 9.5 03/14/2021   CO2 21 03/14/2021    Lab Results  Component Value Date/Time   HGBA1C 5.6 08/01/2015 02:43 AM   HGBA1C 5.8 (H) 06/09/2015 11:59 PM    Last diabetic Eye exam: No results found for: HMDIABEYEEXA  Last diabetic Foot exam: No results found for: HMDIABFOOTEX   Lab Results  Component Value Date   CHOL 152 03/14/2021   HDL 33 (L) 03/14/2021   LDLCALC 71 03/14/2021   TRIG 300 (H) 03/14/2021   CHOLHDL 4.6 (H) 03/14/2021    Hepatic Function Latest Ref Rng & Units 03/14/2021 02/06/2021 09/12/2020  Total Protein 6.0 - 8.5 g/dL 7.0 6.7 6.7  Albumin 3.7 - 4.7 g/dL 4.1 4.2 4.1  AST 0 - 40 IU/L 14 13  12  ALT 0 - 32 IU/L 10 12 1   Alk Phosphatase 44 - 121 IU/L 118 123(H) 128(H)  Total Bilirubin 0.0 - 1.2 mg/dL 0.4 0.2 0.3  Bilirubin, Direct 0.1 - 0.5 mg/dL - - -    Lab Results  Component Value Date/Time   TSH 2.850 03/14/2021 10:52 AM   TSH 2.550 05/15/2020 10:31 AM    CBC Latest Ref Rng & Units 03/14/2021 02/06/2021 09/12/2020  WBC 3.4 - 10.8 x10E3/uL 3.8 6.4 6.1  Hemoglobin 11.1 - 15.9 g/dL 12.9 12.6 12.5  Hematocrit 34.0 - 46.6 % 39.9 38.7 39.1   Platelets 150 - 450 x10E3/uL 282 325 346    Lab Results  Component Value Date/Time   VD25OH 31.6 09/12/2020 10:24 AM   VD25OH 5.3 (L) 05/15/2020 10:31 AM    Clinical ASCVD: Yes  The ASCVD Risk score (Arnett DK, et al., 2019) failed to calculate for the following reasons:   The 2019 ASCVD risk score is only valid for ages 64 to 81   The patient has a prior MI or stroke diagnosis    Depression screen [facility] 2/9 02/06/2021 12/18/2020 09/12/2020  Decreased Interest 1 3 1   Down, Depressed, Hopeless 3 0 1  PHQ - 2 Score 4 3 2   Altered sleeping 3 3 3   Tired, decreased energy 3 3 3   Change in appetite 0 2 0  Feeling bad or failure about yourself  0 0 0  Trouble concentrating 0 0 0  Moving slowly or fidgety/restless 2 0 0  Suicidal thoughts 0 0 0  PHQ-9 Score 12 11 8   Difficult doing work/chores Not difficult at all Somewhat difficult Not difficult at all  Some recent data might be hidden     Social History   Tobacco Use  Smoking Status Never  Smokeless Tobacco Never   BP Readings from Last 3 Encounters:  05/02/21 (!) 152/93  04/03/21 (!) 151/71  03/14/21 130/78   Pulse Readings from Last 3 Encounters:  05/02/21 70  04/03/21 88  03/14/21 81   Wt Readings from Last 3 Encounters:  05/02/21 158 lb (71.7 kg)  04/03/21 158 lb 3.2 oz (71.8 kg)  03/14/21 159 lb 12.8 oz (72.5 kg)    Assessment/Interventions: Review of patient past medical history, allergies, medications, health status, including review of consultants reports, laboratory and other test data, was performed as part of comprehensive evaluation and provision of chronic care management services.   SDOH:  (Social Determinants of Health) assessments and interventions performed: Yes    CCM Care Plan  Allergies  Allergen Reactions   Codeine Nausea And Vomiting    Nausea    Meclizine Other (See Comments)    Lethargy, extreme sleepiness   Requip [Ropinirole Hcl] Nausea And Vomiting   Statins     Muscle weakness     Medications Reviewed Today     Reviewed by Leota Jacobsen, LPN (Licensed Practical Nurse) on 05/02/21 at 1034  Med List Status: <None>   Medication Order Taking? Sig Documenting Provider Last Dose Status Informant  Acetaminophen (TYLENOL) 325 MG CAPS [id number] Yes Take 650 mg by mouth every 6 (six) hours as needed. [provider] Taking Active   Alirocumab (PRALUENT) 62 MG/ML SOAJ [id number] Yes INJECT 1 PEN INTO THE SKIN EVERY 14 (FOURTEEN) DAYS. Josue Hector, MD Taking Active   amLODipine (NORVASC) 10 MG tablet [id number] Yes Take 1 tablet (10 mg total) by mouth daily. Lillard Anes, MD Taking Active   clopidogrel (PLAVIX) 75 MG  tablet [id number] Yes Take 1 tablet (75 mg total) by mouth daily. Lillard Anes, MD Taking Active   cyanocobalamin (,VITAMIN B-12,) 1000 MCG/ML injection [id number] Yes Inject 1 mL (1,000 mcg total) into the muscle every 30 (thirty) days. Lillard Anes, MD Taking Active   D3-50 1.25 MG (50000 UT) capsule [id number] Yes TAKE 1 CAPSULE BY MOUTH ONE TIME PER WEEK Lillard Anes, MD Taking Active   furosemide (LASIX) 20 MG tablet [id number] Yes TAKE 1 TABLET (20 MG TOTAL) BY MOUTH DAILY. TAKING EVERY DAY FOR SWELLING Lillard Anes, MD Taking Active   gabapentin (NEURONTIN) 100 MG capsule [id number] No Take 1 capsule (100 mg total) by mouth 3 (three) times daily.  Patient not taking: Reported on 04/04/2021   Lillard Anes, MD Not Taking Active   latanoprost (XALATAN) 0.005 % ophthalmic solution [id number] No 1 drop at bedtime.  Patient not taking: Reported on 04/04/2021   [provider] Not Taking Active   loratadine (CLARITIN) 10 MG tablet [id number] No Take 10 mg by mouth daily.  Patient not taking: Reported on 04/04/2021   [provider] Not Taking Active   pantoprazole (PROTONIX) 40 MG tablet [id number] Yes TAKE 1 TABLET BY MOUTH TWICE A DAY Lillard Anes, MD Taking Active    valsartan (DIOVAN) 320 MG tablet [id number] Yes Take 1 tablet (320 mg total) by mouth daily. Lillard Anes, MD Taking Active   vortioxetine HBr (TRINTELLIX) 10 MG TABS tablet [id number] Yes Take 1 tablet (10 mg total) by mouth daily. Lillard Anes, MD Taking Active             Patient Active Problem List   Diagnosis Date Noted   Incisional infection 12/25/2020   Visit for suture removal 12/25/2020   History of stroke with residual effects 05/15/2020   Heat exhaustion 11/04/2019   CKD (chronic kidney disease), stage III ([place]) 10/28/2019   Upper GI bleed 10/28/2019   Hiatal hernia 10/28/2019   Essential hypertension 10/28/2019   Depression, major, single episode, severe ([place]) 10/14/2019   BMI 32.0-32.9,adult 10/01/2019   PVD (peripheral vascular disease) ([place]) 10/01/2019   Pain in right knee 10/01/2019   Benign paroxysmal vertigo, unspecified ear 06/07/2019   Primary insomnia 05/25/2019   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   Care Plan : [place]  Updates made by Lane Hacker, RPH since 05/04/2021 12:00 AM     Problem: pvd, htn, hld, depression   Priority: High  Onset Date: 06/13/2020     Long-Range Goal: Disease Management   Start Date: 06/13/2020  Expected End Date: 06/13/2021  Recent Progress: On track  Priority: High  Note:   Current Barriers:  Unable to independently afford treatment  regimen Unable to achieve control of depression   Pharmacist Clinical Goal(s):  Patient will verbalize ability to afford treatment regimen achieve control of depression  as evidenced by improved PHQ-9 through collaboration with PharmD and provider.   Interventions: 1:1 collaboration with Reinaldo Meeker  Percell Miller, MD regarding development and update of comprehensive plan of care as evidenced by provider attestation and co-signature Inter-disciplinary care team collaboration (see longitudinal plan of care) Comprehensive medication review performed; medication list updated in electronic medical record  Hypertension (BP goal 081-448 systolic per Neuro 1/85/63) BP Readings from Last 3 Encounters:  04/03/21 (!) 151/71  03/14/21 130/78  02/06/21 (!) 154/80  -Controlled -Current treatment: amlodipine 10 mg daily Query Appropriate, Query effective, Query Safe, Accessible Valsartan 320 mg daily Query Appropriate, Query effective, Query Safe, Accessible -Medications previously tried: none reported  -Current home readings:  not checking  -Current dietary habits:  limits sweets. Eats frozen dinners. Doesn't follow a heart healthy diet.  -Current exercise habits:  walks around yard and house. Has a bad knee that limits.  -Denies hypotensive/hypertensive symptoms -Educated on BP goals and benefits of medications for prevention of heart attack, stroke and kidney damage; Daily salt intake goal < 2300 mg; Exercise goal of 150 minutes per week; -Counseled to monitor BP at home as needed, document, and provide log at future appointments -Counseled on diet and exercise extensively Jan 2023: Daughter, Santiago Glad, is convinced patient's lethargy and imbalance is due to hypotension. Neuro stated Vit B12 is low and is most likely the cause Plan: Will test BP daily to determine orthostasis. Will take Amlodipine AM and Valsartan PM to help as well Feb 2023: Patient's daughter is very frustrated with patient's overall  healthcare. Is afraid of issues with Valsartan 34m, asked for Valsartan 3225mto be changed to Valsartan 16062mID. Sent direct msg to Dr. PerHenrene Pastoro said no. Called patient to inform her and she scheduled a visit to see PCP next week.   Hyperlipidemia/PVD: (LDL goal < 55) The ASCVD Risk score (Arnett DK, et al., 2019) failed to calculate for the following reasons:   The 2019 ASCVD risk score is only valid for ages 40 80 79 70The patient has a prior MI or stroke diagnosis Lab Results  Component Value Date   CHOL 152 03/14/2021   CHOL 192 09/12/2020   CHOL 177 05/15/2020   Lab Results  Component Value Date   HDL 33 (L) 03/14/2021   HDL 40 09/12/2020   HDL 41 05/15/2020   Lab Results  Component Value Date   LDLCALC 71 03/14/2021   LDLCALC 109 (H) 09/12/2020   LDLCALC 105 (H) 05/15/2020   Lab Results  Component Value Date   TRIG 300 (H) 03/14/2021   TRIG 252 (H) 09/12/2020   TRIG 180 (H) 05/15/2020   Lab Results  Component Value Date   CHOLHDL 4.6 (H) 03/14/2021   CHOLHDL 4.8 (H) 09/12/2020   CHOLHDL 4.3 05/15/2020  No results found for: LDLDIRECT -Not ideally controlled -Current treatment: Praluent 75 mg/ml injection every 14 days Appropriate, Effective, Safe, Accessible Clopidogrel 75 mg daily Appropriate, Effective, Safe, Accessible -Medications previously tried: statins -Current dietary patterns:  cooks at home. Eats some frozen dinners. Banana sandwich, Cheese sandwich. Daughter brings leftovers for meals. Trying to watch sweets. Drinking water with Crystal [employer] package.  -Current exercise habits: walks around yard and house. Has a bad knee.  -Educated on Cholesterol goals;  Importance of limiting foods high in cholesterol; Exercise goal of 150 minutes per week; -Counseled on diet and exercise extensively Recommended to continue current medication.    GERD (Goal: manage symptoms and avoid triggers) -Controlled -Current treatment  Pantoprazole 40 mg PRN  (Patient truly taking PRN) Appropriate, Effective, Safe, Accessible -Medications previously tried: none reported  -  Counseled on diet and exercise extensively Recommended to continue current medication Educated on avoiding triggers, eating smaller meals, elevating head during rest/sleep.   Depression/Anxiety (Goal: improve depression/PHQ9) -Not ideally controlled -Current treatment: Trintellix 50m QD Appropriate, Effective, Safe, Query accessible -Medications previously tried/failed: sertraline  -PHQ9: 7 -Educated on Benefits of medication for symptom control Benefits of cognitive-behavioral therapy with or without medication -Counseled on exercise as a way to improved depression. Discussed gratitude journaling. Patient works to focus on what she is thankful for.  Jan 2023: Is expensive for patient, will do PAP  Osteoporosis / Osteopenia (Goal reduce risk of fracture) -Not ideally controlled - unable to assess since patient declines updated Dexa -Last DEXA Scan:  none in record - last date indicated 08/08/2005.  -Current treatment  Vitamin D 50,000 weekly Appropriate, Effective, Safe, Accessible -Medications previously tried: none reported  -Recommend [phone] units of vitamin D daily. Recommend 1200 mg of calcium daily from dietary and supplemental sources. Recommend weight-bearing and muscle strengthening exercises for building and maintaining bone density. -Counseled on diet and exercise extensively Recommended consider Dexa scan update. Patient declines due to age. Discussed eating a diet rich in calcium through milk, cheese and dark, leafy green vegetables.   Jan 2023: Patient hasn't been taking, explained how to take, when, and why    Patient Goals/Self-Care Activities Patient will:  - take medications as prescribed focus on medication adherence by using pill box target a minimum of 150 minutes of moderate intensity exercise weekly engage in dietary modifications by limiting  sodium and working on heart healthy diet.   Follow Up Plan: Telephone follow up appointment with care management team member scheduled for: May 2023  NArizona Constable PFloridaD. -- [phone]      Medication Assistance:  Praluent obtained through PASS medication assistance program.  Enrollment ends 03/24/2022  Patient's preferred pharmacy is:  [facility] [place] [address] [id number]: [phone]: [phone] [id number] [id number]: [phone]: [phone] pill box? Yes  Pt endorses good compliance  We discussed: Benefits of medication synchronization, packaging and delivery as well as enhanced pharmacist oversight with [facility]. Patient decided to: Utilize [facility] for medication synchronization, packaging and delivery Verbal consent obtained for [facility] enhanced pharmacy services (medication synchronization, adherence packaging, delivery coordination). A medication sync plan was created to allow patient to get all medications delivered once every 30 to 90 days per patient preference. Patient understands they have freedom to choose pharmacy and clinical pharmacist will coordinate care between all prescribers and [facility].   Care Plan and Follow Up Patient Decision:  Patient agrees to Care Plan and Follow-up.  Plan: Telephone follow up appointment with care management team member scheduled for:  May  2023

## 2021-05-04 NOTE — Patient Instructions (Signed)
Visit Information   Goals Addressed   None    Patient Care Plan: CCM Pharmacy Care Plan     Problem Identified: pvd, htn, hld, depression   Priority: High  Onset Date: 06/13/2020     Long-Range Goal: Disease Management   Start Date: 06/13/2020  Expected End Date: 06/13/2021  Recent Progress: On track  Priority: High  Note:   Current Barriers:  Unable to independently afford treatment regimen Unable to achieve control of depression   Pharmacist Clinical Goal(s):  Patient will verbalize ability to afford treatment regimen achieve control of depression  as evidenced by improved PHQ-9 through collaboration with PharmD and provider.   Interventions: 1:1 collaboration with Lillard Anes, MD regarding development and update of comprehensive plan of care as evidenced by provider attestation and co-signature Inter-disciplinary care team collaboration (see longitudinal plan of care) Comprehensive medication review performed; medication list updated in electronic medical record  Hypertension (BP goal A999333 systolic per Neuro A999333) BP Readings from Last 3 Encounters:  04/03/21 (!) 151/71  03/14/21 130/78  02/06/21 (!) 154/80  -Controlled -Current treatment: amlodipine 10 mg daily Query Appropriate, Query effective, Query Safe, Accessible Valsartan 320 mg daily Query Appropriate, Query effective, Query Safe, Accessible -Medications previously tried: none reported  -Current home readings:  not checking  -Current dietary habits:  limits sweets. Eats frozen dinners. Doesn't follow a heart healthy diet.  -Current exercise habits:  walks around yard and house. Has a bad knee that limits.  -Denies hypotensive/hypertensive symptoms -Educated on BP goals and benefits of medications for prevention of heart attack, stroke and kidney damage; Daily salt intake goal < 2300 mg; Exercise goal of 150 minutes per week; -Counseled to monitor BP at home as needed, document, and provide  log at future appointments -Counseled on diet and exercise extensively Jan 2023: Daughter, Santiago Glad, is convinced patient's lethargy and imbalance is due to hypotension. Neuro stated Vit B12 is low and is most likely the cause Plan: Will test BP daily to determine orthostasis. Will take Amlodipine AM and Valsartan PM to help as well Feb 2023: Patient's daughter is very frustrated with patient's overall healthcare. Is afraid of issues with Valsartan 320mg , asked for Valsartan 320mg  to be changed to Valsartan 160mg  BID. Sent direct msg to Dr. Henrene Pastor who said no. Called patient to inform her and she scheduled a visit to see PCP next week.   Hyperlipidemia/PVD: (LDL goal < 55) The ASCVD Risk score (Arnett DK, et al., 2019) failed to calculate for the following reasons:   The 2019 ASCVD risk score is only valid for ages 58 to 40   The patient has a prior MI or stroke diagnosis Lab Results  Component Value Date   CHOL 152 03/14/2021   CHOL 192 09/12/2020   CHOL 177 05/15/2020   Lab Results  Component Value Date   HDL 33 (L) 03/14/2021   HDL 40 09/12/2020   HDL 41 05/15/2020   Lab Results  Component Value Date   LDLCALC 71 03/14/2021   LDLCALC 109 (H) 09/12/2020   LDLCALC 105 (H) 05/15/2020   Lab Results  Component Value Date   TRIG 300 (H) 03/14/2021   TRIG 252 (H) 09/12/2020   TRIG 180 (H) 05/15/2020   Lab Results  Component Value Date   CHOLHDL 4.6 (H) 03/14/2021   CHOLHDL 4.8 (H) 09/12/2020   CHOLHDL 4.3 05/15/2020  No results found for: LDLDIRECT -Not ideally controlled -Current treatment: Praluent 75 mg/ml injection every 14 days Appropriate, Effective, Safe,  Accessible Clopidogrel 75 mg daily Appropriate, Effective, Safe, Accessible -Medications previously tried: statins -Current dietary patterns:  cooks at home. Eats some frozen dinners. Banana sandwich, Cheese sandwich. Daughter brings leftovers for meals. Trying to watch sweets. Drinking water with [employer] package.   -Current exercise habits: walks around yard and house. Has a bad knee.  -Educated on Cholesterol goals;  Importance of limiting foods high in cholesterol; Exercise goal of 150 minutes per week; -Counseled on diet and exercise extensively Recommended to continue current medication.    GERD (Goal: manage symptoms and avoid triggers) -Controlled -Current treatment  Pantoprazole 40 mg PRN (Patient truly taking PRN) Appropriate, Effective, Safe, Accessible -Medications previously tried: none reported  -Counseled on diet and exercise extensively Recommended to continue current medication Educated on avoiding triggers, eating smaller meals, elevating head during rest/sleep.   Depression/Anxiety (Goal: improve depression/PHQ9) -Not ideally controlled -Current treatment: Trintellix 10mg  QD Appropriate, Effective, Safe, Query accessible -Medications previously tried/failed: sertraline  -PHQ9: 7 -Educated on Benefits of medication for symptom control Benefits of cognitive-behavioral therapy with or without medication -Counseled on exercise as a way to improved depression. Discussed gratitude journaling. Patient works to focus on what she is thankful for.  Jan 2023: Is expensive for patient, will do PAP  Osteoporosis / Osteopenia (Goal reduce risk of fracture) -Not ideally controlled - unable to assess since patient declines updated Dexa -Last DEXA Scan:  none in record - last date indicated 08/08/2005.  -Current treatment  Vitamin D 50,000 weekly Appropriate, Effective, Safe, Accessible -Medications previously tried: none reported  -Recommend [phone] units of vitamin D daily. Recommend 1200 mg of calcium daily from dietary and supplemental sources. Recommend weight-bearing and muscle strengthening exercises for building and maintaining bone density. -Counseled on diet and exercise extensively Recommended consider Dexa scan update. Patient declines due to age. Discussed eating a diet rich  in calcium through milk, cheese and dark, leafy green vegetables.   Jan 2023: Patient hasn't been taking, explained how to take, when, and why    Patient Goals/Self-Care Activities Patient will:  - take medications as prescribed focus on medication adherence by using pill box target a minimum of 150 minutes of moderate intensity exercise weekly engage in dietary modifications by limiting sodium and working on heart healthy diet.   Follow Up Plan: Telephone follow up appointment with care management team member scheduled for: May 2023  [place] Constable, [place].D. [phone]      Ms. Amborn was given information about Chronic Care Management services today including:  CCM service includes personalized support from designated clinical staff supervised by her physician, including individualized plan of care and coordination with other care providers 24/7 contact phone numbers for assistance for urgent and routine care needs. Standard insurance, coinsurance, copays and deductibles apply for chronic care management only during months in which we provide at least 20 minutes of these services. Most insurances cover these services at 100%, however patients may be responsible for any copay, coinsurance and/or deductible if applicable. This service may help you avoid the need for more expensive face-to-face services. Only one practitioner may furnish and bill the service in a calendar month. The patient may stop CCM services at any time (effective at the end of the month) by phone call to the office staff.  Patient agreed to services and verbal consent obtained.   The patient verbalized understanding of instructions, educational materials, and care plan provided today and declined offer to receive copy of patient instructions, educational materials, and care plan.  The pharmacy team will reach out to the patient again over the next 60 days.   Lane Hacker, [place]

## 2021-05-07 ENCOUNTER — Other Ambulatory Visit: Payer: Self-pay

## 2021-05-07 ENCOUNTER — Ambulatory Visit (INDEPENDENT_AMBULATORY_CARE_PROVIDER_SITE_OTHER): Payer: Medicare HMO | Admitting: Legal Medicine

## 2021-05-07 ENCOUNTER — Encounter: Payer: Self-pay | Admitting: Legal Medicine

## 2021-05-07 VITALS — BP 160/96 | HR 85 | Temp 97.3°F | Ht 60.0 in | Wt 155.0 lb

## 2021-05-07 DIAGNOSIS — I6521 Occlusion and stenosis of right carotid artery: Secondary | ICD-10-CM

## 2021-05-07 DIAGNOSIS — G450 Vertebro-basilar artery syndrome: Secondary | ICD-10-CM

## 2021-05-07 DIAGNOSIS — I10 Essential (primary) hypertension: Secondary | ICD-10-CM | POA: Diagnosis not present

## 2021-05-07 DIAGNOSIS — I771 Stricture of artery: Secondary | ICD-10-CM

## 2021-05-07 DIAGNOSIS — E538 Deficiency of other specified B group vitamins: Secondary | ICD-10-CM

## 2021-05-07 MED ORDER — VALSARTAN 160 MG PO TABS: 320.0000 mg | ORAL_TABLET | Freq: Every day | ORAL | 2 refills | Status: DC

## 2021-05-07 NOTE — Progress Notes (Signed)
Subjective:  Patient ID: [name], female    DOB: 1940/06/21  Age: 81 y.o. MRN: [id number]  Chief Complaint  Patient presents with   Discuss Specialty Doctors Results    HPI: visit  Saw neurology and vascular surgeon for stroke. 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 She has occlusion right vertebral artery.  Daughter had multiple questions about her neurology and vascular consult.  Patient still does not feel well and would like to be proceed with arteriography I discussed with her that this may be a 2 visit thank one of the arteriography and 1 with intervention especially if in the carotid.  Patient understood the risk as stated the daughters and we will set up with interventional radiologist in [facility]  She is taking valsartan 160mg , 320 mg causes ow BP, uses at night.  Amlodipine in AM.  dR. Devenshwar- intervential radiology.   Want to discuss results from Specialty Doctors  Current Outpatient Medications on File Prior to Visit  Medication Sig Dispense Refill   Acetaminophen (TYLENOL) 325 MG CAPS Take 650 mg by mouth every 6 (six) hours as needed.     Alirocumab (PRALUENT) 75 MG/ML SOAJ INJECT 1 PEN INTO THE SKIN EVERY 14 (FOURTEEN) DAYS. 2 mL 11   amLODipine (NORVASC) 10 MG tablet Take 1 tablet (10 mg total) by mouth daily. 90 tablet 1   clopidogrel (PLAVIX) 75 MG tablet Take 1 tablet (75 mg total) by mouth daily. 90 tablet 1   cyanocobalamin (,VITAMIN B-12,) 1000 MCG/ML injection Inject 1 mL (1,000 mcg total) into the muscle every 30 (thirty) days. 30 mL 2   D3-50 1.25 MG (50000 UT) capsule TAKE 1 CAPSULE BY MOUTH ONE TIME PER WEEK 12 capsule 3   furosemide (LASIX) 20 MG tablet TAKE 1 TABLET (20 MG TOTAL) BY MOUTH DAILY. TAKING EVERY DAY FOR SWELLING 90 tablet 2   gabapentin (NEURONTIN) 100 MG capsule Take 1 capsule (100 mg total) by mouth 3 (three) times daily. (Patient not taking: Reported on 04/04/2021) 90  capsule 3   latanoprost (XALATAN) 0.005 % ophthalmic solution 1 drop at bedtime. (Patient not taking: Reported on 04/04/2021)     loratadine (CLARITIN) 10 MG tablet Take 10 mg by mouth daily. (Patient not taking: Reported on 04/04/2021)     pantoprazole (PROTONIX) 40 MG tablet TAKE 1 TABLET BY MOUTH TWICE A DAY 180 tablet 2   vortioxetine HBr (TRINTELLIX) 10 MG TABS tablet Take 1 tablet (10 mg total) by mouth daily. 90 tablet 1   No current facility-administered medications on file prior to visit.   Past Medical History:  Diagnosis Date   Benign paroxysmal vertigo, unspecified ear 06/07/2019   CKD (chronic kidney disease), stage III (HCC)    GERD (gastroesophageal reflux disease)    Glaucoma    Past Surgical History:  Procedure Laterality Date   ABDOMINAL HYSTERECTOMY     BACK SURGERY     cataract surgery     COLONOSCOPY N/A 08/03/2015   Procedure: COLONOSCOPY;  Surgeon: Laurence Spates, MD;  Location: [place];  Service: Endoscopy;  Laterality: N/A;   ESOPHAGOGASTRODUODENOSCOPY N/A 07/31/2015   Procedure: ESOPHAGOGASTRODUODENOSCOPY (EGD);  Surgeon: Laurence Spates, MD;  Location[facility] ENDOSCOPY;  Service: Endoscopy;  Laterality: N/A;   IR RADIOLOGIST EVAL & MGMT  04/26/2021   OOPHORECTOMY     TOTAL HIP ARTHROPLASTY     URETER SURGERY      Family History  Problem Relation Age  of Onset   Hypertension Mother    Hypertension Father    Heart attack Father    Hypertension Brother    Diabetes Brother    Social History   Socioeconomic History   Marital status: Married    Spouse name: Not on file   Number of children: Not on file   Years of education: Not on file   Highest education level: Not on file  Occupational History   Not on file  Tobacco Use   Smoking status: Never   Smokeless tobacco: Never  Vaping Use   Vaping Use: Never used  Substance and Sexual Activity   Alcohol use: No   Drug use: No   Sexual activity: Not Currently  Other Topics Concern   Not on file  Social  History Narrative   Not on file   Social Determinants of Health   Financial Resource Strain: Low Risk    Difficulty of Paying Living Expenses: Not hard at all  Food Insecurity: No Food Insecurity   Worried About Charity fundraiser in the Last Year: Never true   [place] in the Last Year: Never true  Transportation Needs: No Transportation Needs   Lack of Transportation (Medical): No   Lack of Transportation (Non-Medical): No  Physical Activity: Not on file  Stress: Not on file  Social Connections: Not on file    Review of Systems  Constitutional:  Negative for chills, fatigue and fever.  HENT:  Negative for congestion, ear pain, rhinorrhea and sore throat.   Eyes:  Negative for visual disturbance.  Respiratory:  Negative for cough and shortness of breath.   Cardiovascular:  Negative for chest pain.  Gastrointestinal:  Negative for abdominal pain, constipation, diarrhea, nausea and vomiting.  Genitourinary:  Negative for dysuria and urgency.  Musculoskeletal:  Negative for back pain and myalgias.  Neurological:  Negative for dizziness, weakness, light-headedness and headaches.  Psychiatric/Behavioral:  Negative for dysphoric mood. The patient is not nervous/anxious.     Objective:  BP (!) 160/96 Comment: 144/90 in right arm   Pulse 85    Temp (!) 97.3 F (36.3 C)    Ht 5' (1.524 m)    Wt 155 lb (70.3 kg)    SpO2 93%    BMI 30.27 kg/m   BP/Weight 05/07/2021 05/02/2021 [id number]  Systolic BP 0000000 0000000 [id number]  Diastolic BP 96 93 71  Wt. (Lbs) 155 158 158.2  BMI 30.27 30.86 30.9    Physical Exam Vitals reviewed.  Constitutional:      Appearance: Normal appearance. She is normal weight.  HENT:     Head: Normocephalic.     Right Ear: Tympanic membrane normal.     Left Ear: Tympanic membrane normal.     Mouth/Throat:     Mouth: Mucous membranes are moist.     Pharynx: Oropharynx is clear.  Eyes:     Extraocular Movements: Extraocular movements intact.      Conjunctiva/sclera: Conjunctivae normal.     Pupils: Pupils are equal, round, and reactive to light.  Cardiovascular:     Rate and Rhythm: Normal rate and regular rhythm.     Pulses: Normal pulses.     Heart sounds: Normal heart sounds. No murmur heard.   No gallop.  Pulmonary:     Effort: Pulmonary effort is normal. No respiratory distress.     Breath sounds: No wheezing.  Abdominal:     General: Abdomen is flat. Bowel sounds are normal. There is no  distension.     Tenderness: There is no abdominal tenderness.  Musculoskeletal:     Cervical back: Normal range of motion.  Skin:    General: Skin is warm.     Capillary Refill: Capillary refill takes less than 2 seconds.  Neurological:     Mental Status: She is alert and oriented to person, place, and time.     Motor: Weakness (generalized) present.     Gait: Gait abnormal (falls backward).     Comments: Walks with walker        Lab Results  Component Value Date   WBC 3.8 03/14/2021   HGB 12.9 03/14/2021   HCT 39.9 03/14/2021   PLT 282 03/14/2021   GLUCOSE 111 (H) 03/14/2021   CHOL 152 03/14/2021   TRIG 300 (H) 03/14/2021   HDL 33 (L) 03/14/2021   LDLCALC 71 03/14/2021   ALT 10 03/14/2021   AST 14 03/14/2021   NA 143 03/14/2021   K 4.1 03/14/2021   CL 107 (H) 03/14/2021   CREATININE 1.12 (H) 03/14/2021   BUN 18 03/14/2021   CO2 21 03/14/2021   TSH 2.850 03/14/2021   INR 1.22 07/31/2015   HGBA1C 5.6 08/01/2015      Assessment & Plan:   Diagnoses and all orders for this visit: B12 deficiency -     Vitamin B12 Patient was found to have B12 deficiency by neurology and started on 1000 units once a month family question why she was not on a loading dose once a week for a month.  We discussed. Essential (primary) hypertension -     valsartan (DIOVAN) 160 MG tablet; Take 2 tablets (320 mg total) by mouth at bedtime. Patient has been having some hypotension with the 320 mg of valsartan.  We will have her take 160 mg  of valsartan nightly and her amlodipine 10 mg in the morning Vertebrobasilar artery insufficiency -     Ambulatory referral to Interventional Radiology Vascular CT patient was found to have his partial stenosis of the right vertebral artery which they would like to examine more closely with an arteriogram.  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  They recommend an arteriogram to better determine this but vascular or neurosurgery may have to repair this if it is a problem.  .       Follow-up: Return in about 3 months (around 08/04/2021).  An After Visit Summary was printed and given to the patient.  Reinaldo Meeker, MD [facility] [phone]

## 2021-05-08 ENCOUNTER — Other Ambulatory Visit (HOSPITAL_COMMUNITY): Payer: Self-pay | Admitting: Interventional Radiology

## 2021-05-08 DIAGNOSIS — I671 Cerebral aneurysm, nonruptured: Secondary | ICD-10-CM

## 2021-05-08 LAB — VITAMIN B12: Vitamin B-12: 283 pg/mL (ref 232–1245)

## 2021-05-08 NOTE — Progress Notes (Signed)
B12 283 normal lp

## 2021-05-09 ENCOUNTER — Telehealth (HOSPITAL_COMMUNITY): Payer: Self-pay

## 2021-05-09 NOTE — Telephone Encounter (Signed)
Called to schedule an angiogram with Dr. Corliss Skains, no answer, left vm. AW

## 2021-05-09 NOTE — Telephone Encounter (Signed)
Returned call, no answer, left vm. AW 

## 2021-05-10 ENCOUNTER — Telehealth (HOSPITAL_COMMUNITY): Payer: Self-pay

## 2021-05-10 NOTE — Telephone Encounter (Signed)
Called to schedule angiogram, no answer, left vm. AW 

## 2021-05-15 ENCOUNTER — Telehealth: Payer: Self-pay

## 2021-05-15 NOTE — Chronic Care Management (AMB) (Signed)
05/15/21- I called pt daughter back after receiveing a message that she had questions about her moms medication. She did not answer. I left a message to call me back.  Called again after receiving a message they called back but had to leave another vm.   05/17/21- Left another message after receiving a vm last night from her and stated she will be available today until 11. She did not answer and had to lvm   [name], CMA Clinical Pharmacist Assistant  [phone]

## 2021-05-16 DIAGNOSIS — H353132 Nonexudative age-related macular degeneration, bilateral, intermediate dry stage: Secondary | ICD-10-CM | POA: Diagnosis not present

## 2021-05-22 ENCOUNTER — Telehealth: Payer: Self-pay

## 2021-05-22 ENCOUNTER — Ambulatory Visit (INDEPENDENT_AMBULATORY_CARE_PROVIDER_SITE_OTHER): Payer: Medicare HMO

## 2021-05-22 ENCOUNTER — Ambulatory Visit: Payer: Medicare HMO

## 2021-05-22 ENCOUNTER — Other Ambulatory Visit: Payer: Self-pay

## 2021-05-22 DIAGNOSIS — N1831 Chronic kidney disease, stage 3a: Secondary | ICD-10-CM | POA: Diagnosis not present

## 2021-05-22 DIAGNOSIS — E782 Mixed hyperlipidemia: Secondary | ICD-10-CM | POA: Diagnosis not present

## 2021-05-22 DIAGNOSIS — E538 Deficiency of other specified B group vitamins: Secondary | ICD-10-CM

## 2021-05-22 DIAGNOSIS — I10 Essential (primary) hypertension: Secondary | ICD-10-CM

## 2021-05-22 MED ORDER — CYANOCOBALAMIN 1000 MCG/ML IJ SOLN
1000.0000 ug | Freq: Once | INTRAMUSCULAR | Status: AC
  Administered 2021-05-22: 1000 ug via INTRAMUSCULAR

## 2021-05-22 MED ORDER — CYANOCOBALAMIN 1000 MCG/ML IJ SOLN: 1000.0000 ug | INTRAMUSCULAR | 2 refills | Status: DC

## 2021-05-22 NOTE — Progress Notes (Signed)
Patient in office for subsequent b12 injection. Tolerated in L deltoid.   [name] 05/22/21 11:37 AM

## 2021-05-22 NOTE — Chronic Care Management (AMB) (Signed)
Chronic Care Management Pharmacy Assistant   Name: [name]  MRN: [id number] DOB: 1940/12/28  Reason for Encounter: Medication Review   05/22/2021- Daughter requested call from pharmacy team regarding patients medications. Called Daughter [name], she was a little upset regarding the process it will take with patient getting all of her medications from [place]. Daughter stated that patient Praluent injection is due tomorrow and [facility] did not have prescription on file so [facility] called [facility] and they have her prescription and will fill for daughter to pick up tomorrow. Discussed with daughter the benefits of patient having her medications delivered to her which will help reduce daughters burden in running back and forth to the pharmacy. Daughter sounded very overwhelmed with being a caregiver for her mom, granddaughter and daughter and she mentioned she just lost her husband in September 2022. Daughter would like for medicaitons to be transferred [facility] to [facility], checked patient profile and informed that her Trintellix order was in progress for delivery. Daughter mentioned that they told her the price would be $200 but it should only be $100. Checked [facility] profile and medication is only $100, daughter would like to know when pharmacy will be coming out, patient and spouse are hard of hearing and both slow to arrive to the door and wants to give them an ETA on delivery. Checked with [facility], they have been trying to get in contact with patient to delivery but they can deliver tomorrow between 11-3pm. Daughter aware. I also inquired if patient was able to fill out patient assistance application for Trintellix with [employer] Patient assistance program that was mailed back in January to help in getting this medication for free. Daughter will check patient mail when she goes over tomorrow, if application is there she will fill out and bring to PCP office on Thursday. Informed daughter  that it looks like all patients medications will be synced next month if she would like for [place] to proceed in packaging medications. Daughter was leery at this time, would like [facility] but we will contact her next month to review again. [place] Constable, Clinical Pharmacist aware and will reach out to daughter also. Message sent to assistants to request prescription of Praluent from Dr Johnsie Cancel office and Latanoprost from Dr. Melony Overly office.    Medications: Outpatient Encounter Medications as of 05/22/2021  Medication Sig   Acetaminophen (TYLENOL) 325 MG CAPS Take 650 mg by mouth every 6 (six) hours as needed.   Alirocumab (PRALUENT) 75 MG/ML SOAJ INJECT 1 PEN INTO THE SKIN EVERY 14 (FOURTEEN) DAYS.   amLODipine (NORVASC) 10 MG tablet Take 1 tablet (10 mg total) by mouth daily.   clopidogrel (PLAVIX) 75 MG tablet Take 1 tablet (75 mg total) by mouth daily.   cyanocobalamin (,VITAMIN B-12,) 1000 MCG/ML injection Inject 1 mL (1,000 mcg total) into the muscle every 30 (thirty) days.   D3-50 1.25 MG (50000 UT) capsule TAKE 1 CAPSULE BY MOUTH ONE TIME PER WEEK   furosemide (LASIX) 20 MG tablet TAKE 1 TABLET (20 MG TOTAL) BY MOUTH DAILY. TAKING EVERY DAY FOR SWELLING   gabapentin (NEURONTIN) 100 MG capsule Take 1 capsule (100 mg total) by mouth 3 (three) times daily. (Patient not taking: Reported on 04/04/2021)   latanoprost (XALATAN) 0.005 % ophthalmic solution 1 drop at bedtime. (Patient not taking: Reported on 04/04/2021)   loratadine (CLARITIN) 10 MG tablet Take 10 mg by mouth daily. (Patient not taking: Reported on 04/04/2021)   pantoprazole (  PROTONIX) 40 MG tablet TAKE 1 TABLET BY MOUTH TWICE A DAY   valsartan (DIOVAN) 160 MG tablet Take 2 tablets (320 mg total) by mouth at bedtime.   vortioxetine HBr (TRINTELLIX) 10 MG TABS tablet Take 1 tablet (10 mg total) by mouth daily.   No facility-administered encounter medications on file as of 05/22/2021.   Pattricia Yates, [place] Pharmacist  Assistant [phone]

## 2021-05-23 ENCOUNTER — Telehealth: Payer: Self-pay

## 2021-05-23 ENCOUNTER — Telehealth: Payer: Self-pay | Admitting: Cardiovascular Disease

## 2021-05-23 MED ORDER — PRALUENT 75 MG/ML ~~LOC~~ SOAJ: 1.0000 "pen " | SUBCUTANEOUS | 3 refills | Status: DC

## 2021-05-23 NOTE — Telephone Encounter (Signed)
?*  STAT* If patient is at the pharmacy, call can be transferred to refill team. ? ? ?1. Which medications need to be refilled? (please list name of each medication and dose if known) Alirocumab (PRALUENT) 75 MG/ML SOAJ ? ?2. Which pharmacy/location (including street and city if local pharmacy) is medication to be sent to? [facility] - [place], [place] - [id number] ? ?3. Do they need a 30 day or 90 day supply? 90 ? ?

## 2021-05-23 NOTE — Chronic Care Management (AMB) (Signed)
? ? ?  Chronic Care Management ?Pharmacy Assistant  ? ?Name: [name]  MRN: [id number] DOB: 11/28/1940 ? ? ?Reason for Encounter: Refill Review  ? ?05/23/21- Called Dr. Eden Emms office and requested pt Praluent 75mg  be refilled and sent to [facility]. Spoke with the staff and they sent an RX to [facility] today.  ? ?05/23/21- Called Dr. 07/23/21 office at [phone] to fill Latanoprost .005% .Spoke with the front staff and they sent a refill request to the clinical team to send in a rx to [facility] and will call back if needed.  ?  ?Medications: ?Outpatient Encounter Medications as of 05/23/2021  ?Medication Sig  ? Acetaminophen (TYLENOL) 325 MG CAPS Take 650 mg by mouth every 6 (six) hours as needed.  ? Alirocumab (PRALUENT) 75 MG/ML SOAJ INJECT 1 PEN INTO THE SKIN EVERY 14 (FOURTEEN) DAYS.  ? amLODipine (NORVASC) 10 MG tablet Take 1 tablet (10 mg total) by mouth daily.  ? clopidogrel (PLAVIX) 75 MG tablet Take 1 tablet (75 mg total) by mouth daily.  ? cyanocobalamin (,VITAMIN B-12,) 1000 MCG/ML injection Inject 1 mL (1,000 mcg total) into the muscle every 30 (thirty) days.  ? D3-50 1.25 MG (50000 UT) capsule TAKE 1 CAPSULE BY MOUTH ONE TIME PER WEEK  ? furosemide (LASIX) 20 MG tablet TAKE 1 TABLET (20 MG TOTAL) BY MOUTH DAILY. TAKING EVERY DAY FOR SWELLING  ? gabapentin (NEURONTIN) 100 MG capsule Take 1 capsule (100 mg total) by mouth 3 (three) times daily. (Patient not taking: Reported on 04/04/2021)  ? latanoprost (XALATAN) 0.005 % ophthalmic solution 1 drop at bedtime. (Patient not taking: Reported on 04/04/2021)  ? loratadine (CLARITIN) 10 MG tablet Take 10 mg by mouth daily. (Patient not taking: Reported on 04/04/2021)  ? pantoprazole (PROTONIX) 40 MG tablet TAKE 1 TABLET BY MOUTH TWICE A DAY  ? valsartan (DIOVAN) 160 MG tablet Take 2 tablets (320 mg total) by mouth at bedtime.  ? vortioxetine HBr (TRINTELLIX) 10 MG TABS tablet Take 1 tablet (10 mg total) by mouth daily.  ? ?No facility-administered  encounter medications on file as of 05/23/2021.  ? ? ?07/23/2021, CMA ?Clinical Pharmacist Assistant  ?[phone]  ?

## 2021-05-24 ENCOUNTER — Telehealth: Payer: Self-pay | Admitting: Cardiovascular Disease

## 2021-05-24 MED ORDER — PRALUENT 75 MG/ML ~~LOC~~ SOAJ: 1.0000 "pen " | SUBCUTANEOUS | 3 refills | Status: DC

## 2021-05-24 NOTE — Telephone Encounter (Signed)
3 way called the pharmacy along w/pt's daughter to provide healthwell grant: ?PATIENT ?[name] ?  ?STATUS  ?Active ?  ?START DATE ?04/24/2021 ?  ?END DATE ?04/23/2022 ?  ?ASSISTANCE TYPE ?Co-pay ?  ?PAID ?$0.00 ?  ?PENDING ?$0.00 ?  ?BALANCE ?$2500.00 ?Pharmacy Card ?CARD NO. ?[id number] ?  ?CARD STATUS ?Active ?  ?BIN ?[id number] ?  ?PCN ?[id number] ?  ?PC GROUP ?[id number] ?  ?HELP DESK ?[phone] ?  ?PROVIDER ?PDMI ?  ?PROCESSOR ?PDMI ?

## 2021-05-24 NOTE — Telephone Encounter (Signed)
Pt c/o medication issue: ? ?1. Name of Medication: Alirocumab (PRALUENT) 75 MG/ML SOAJ ? ?2. How are you currently taking this medication (dosage and times per day)? As directed ? ?3. Are you having a reaction (difficulty breathing--STAT)? no ? ?4. What is your medication issue? Spoke to patient's daughter. She states that the patient was on an assistance program for this medication and when she went to get a refill on it yesterday the pharmacy told her that the manufacturer voucher had ran out and it was going to be $150.00 for the 3 refills. She did have to pay $42.00 yesterday so that her mother could get the injection but wants to know if she can get re-certified back into the assistance program. Please advise.  ?

## 2021-05-31 ENCOUNTER — Telehealth: Payer: Self-pay

## 2021-05-31 ENCOUNTER — Other Ambulatory Visit: Payer: Self-pay | Admitting: Radiology

## 2021-05-31 ENCOUNTER — Other Ambulatory Visit: Payer: Self-pay | Admitting: Internal Medicine

## 2021-05-31 ENCOUNTER — Other Ambulatory Visit: Payer: Self-pay | Admitting: Student

## 2021-05-31 NOTE — Chronic Care Management (AMB) (Signed)
Chronic Care Management Pharmacy Assistant   Name: [name]  MRN: [id number] DOB: [birth year]  Reason for Encounter: Medication Review/ Medication Coordination  Recent office visits:  05-22-2021 Diane Bue, LPN. B12 injection given.  05-07-2021 Diane Miyamoto, MD. Order placed for interventional radiology.  Recent consult visits:  None  Hospital visits:  None in previous 6 months  Medications: Outpatient Encounter Medications as of 05/31/2021  Medication Sig Note   Acetaminophen (TYLENOL) 325 MG CAPS Take 650 mg by mouth every 6 (six) hours as needed (pain).    Alirocumab (PRALUENT) 75 MG/ML SOAJ Inject 1 pen into the skin every 14 (fourteen) days.    amLODipine (NORVASC) 10 MG tablet Take 1 tablet (10 mg total) by mouth daily. (Patient taking differently: Take 10 mg by mouth at bedtime.)    clopidogrel (PLAVIX) 75 MG tablet Take 1 tablet (75 mg total) by mouth daily.    cyanocobalamin (,VITAMIN B-12,) 1000 MCG/ML injection Inject 1 mL (1,000 mcg total) into the muscle every 30 (thirty) days.    D3-50 1.25 MG (50000 UT) capsule TAKE 1 CAPSULE BY MOUTH ONE TIME PER WEEK 05/30/2021: Daughter confirmed that she has this medicine but doesn't think the pt is taking it regularly, daughter also states she doesn't think the pt knows if she takes it or not   furosemide (LASIX) 20 MG tablet TAKE 1 TABLET (20 MG TOTAL) BY MOUTH DAILY. TAKING EVERY DAY FOR SWELLING (Patient taking differently: Take 20 mg by mouth daily as needed for edema.)    latanoprost (XALATAN) 0.005 % ophthalmic solution Place 1 drop into both eyes at bedtime.    loratadine (CLARITIN) 10 MG tablet Take 10 mg by mouth daily as needed for allergies.    pantoprazole (PROTONIX) 40 MG tablet TAKE 1 TABLET BY MOUTH TWICE A DAY (Patient not taking: Reported on 05/30/2021)    valsartan (DIOVAN) 160 MG tablet Take 2 tablets (320 mg total) by mouth at bedtime. (Patient taking differently: Take 160 mg by mouth at  bedtime.)    vortioxetine HBr (TRINTELLIX) 10 MG TABS tablet Take 1 tablet (10 mg total) by mouth daily. (Patient taking differently: Take 10 mg by mouth at bedtime.)    No facility-administered encounter medications on file as of 05/31/2021.   Reviewed chart for medication changes ahead of medication coordination call.  No Consults, or hospital visits since last care coordination call/Pharmacist visit.   No medication changes indicated OR if recent visit, treatment plan here.  BP Readings from Last 3 Encounters:  05/07/21 (!) 160/96  05/02/21 (!) 152/93  04/03/21 (!) 151/71    Lab Results  Component Value Date   HGBA1C 5.6 08/01/2015     Patient obtains medications through Adherence Packaging  30 Days   Last adherence delivery included:  Trintellix 10 mg daily at breakfast- delivered on 05-14-2021 30 DS Plavix 75 mg daily at breakfast- delivered on 04-16-2021 56 DS Valsartan 160 mg twice at bedtime- delivered on 05-04-2021 38 DS  Patient declined (meds) last month: Lasix- filled at [facility] 04-20-2021 90 DS Vitamin D3- filled at [facility] Amlodipine- filled at [facility] 03-14-2021 90 DS Latanoprost- filled at [facility] 05-24-2021 84 DS Praluent- filled at [facility] 05-23-2021 28 DS  Patient is due for next adherence delivery on: 06-11-2021  Called patient and reviewed medications and coordinated delivery.  This delivery to include: Amlodipine 10 mg at breakfast Vitamin D3 5000 units at breakfast on Tuesdays Plavix 75 mg at breakfast Valsartan 160 mg twice at  bedtime Trintellix 10 mg at breakfast Latanoprost 0.01% 1 drop in each eye at bedtime  Patient needs refills for: None  Did not confirm delivery date of 06-11-2021.  NOTES: Patient's daughter stated she can't talk because she was on the way to the ER with patient. Informed pharmacist.  Care Gaps: Shingrix overdue PNA Vac overdue  Star Rating Drugs: Valsartan 160 mg- Last filled 05-04-2021 38 DS [facility]  [facility]  CMA Clinical Pharmacist Assistant [phone]

## 2021-06-01 ENCOUNTER — Other Ambulatory Visit: Payer: Self-pay

## 2021-06-01 ENCOUNTER — Other Ambulatory Visit (HOSPITAL_COMMUNITY): Payer: Self-pay | Admitting: Interventional Radiology

## 2021-06-01 ENCOUNTER — Ambulatory Visit (HOSPITAL_COMMUNITY)
Admission: RE | Admit: 2021-06-01 | Discharge: 2021-06-01 | Disposition: A | Discharge status: Home / Self Care | Payer: Medicare HMO | Source: Ambulatory Visit | Attending: Interventional Radiology | Admitting: Interventional Radiology

## 2021-06-01 ENCOUNTER — Encounter (HOSPITAL_COMMUNITY): Payer: Self-pay

## 2021-06-01 DIAGNOSIS — Z7902 Long term (current) use of antithrombotics/antiplatelets: Secondary | ICD-10-CM | POA: Diagnosis not present

## 2021-06-01 DIAGNOSIS — N183 Chronic kidney disease, stage 3 unspecified: Secondary | ICD-10-CM | POA: Insufficient documentation

## 2021-06-01 DIAGNOSIS — Z79899 Other long term (current) drug therapy: Secondary | ICD-10-CM | POA: Diagnosis not present

## 2021-06-01 DIAGNOSIS — I671 Cerebral aneurysm, nonruptured: Secondary | ICD-10-CM

## 2021-06-01 DIAGNOSIS — H409 Unspecified glaucoma: Secondary | ICD-10-CM | POA: Diagnosis not present

## 2021-06-01 DIAGNOSIS — K219 Gastro-esophageal reflux disease without esophagitis: Secondary | ICD-10-CM | POA: Diagnosis not present

## 2021-06-01 DIAGNOSIS — R42 Dizziness and giddiness: Secondary | ICD-10-CM | POA: Insufficient documentation

## 2021-06-01 HISTORY — PX: IR ANGIO INTRA EXTRACRAN SEL COM CAROTID INNOMINATE BILAT MOD SED: IMG5360

## 2021-06-01 HISTORY — PX: IR ANGIO VERTEBRAL SEL SUBCLAVIAN INNOMINATE UNI L MOD SED: IMG5364

## 2021-06-01 LAB — BASIC METABOLIC PANEL
Anion gap: 6 (ref 5–15)
BUN: 20 mg/dL (ref 8–23)
CO2: 24 mmol/L (ref 22–32)
Calcium: 9.8 mg/dL (ref 8.9–10.3)
Chloride: 109 mmol/L (ref 98–111)
Creatinine, Ser: 1.06 mg/dL — ABNORMAL HIGH (ref 0.44–1.00)
GFR, Estimated: 53 mL/min — ABNORMAL LOW (ref >60)
Glucose, Bld: 111 mg/dL — ABNORMAL HIGH (ref 70–99)
Potassium: 4.2 mmol/L (ref 3.5–5.1)
Sodium: 139 mmol/L (ref 135–145)

## 2021-06-01 LAB — PROTIME-INR
INR: 1 (ref 0.8–1.2)
Prothrombin Time: 13 seconds (ref 11.4–15.2)

## 2021-06-01 LAB — CBC
HCT: 39.7 % (ref 36.0–46.0)
Hemoglobin: 12.9 g/dL (ref 12.0–15.0)
MCH: 30.9 pg (ref 26.0–34.0)
MCHC: 32.5 g/dL (ref 30.0–36.0)
MCV: 95.2 fL (ref 80.0–100.0)
Platelets: 351 10*3/uL (ref 150–400)
RBC: 4.17 MIL/uL (ref 3.87–5.11)
RDW: 12.5 % (ref 11.5–15.5)
WBC: 5.8 10*3/uL (ref 4.0–10.5)
nRBC: 0 % (ref 0.0–0.2)

## 2021-06-01 MED ORDER — ONDANSETRON HCL 4 MG/2ML IJ SOLN
INTRAMUSCULAR | Status: AC
  Filled 2021-06-01: qty 2

## 2021-06-01 MED ORDER — KETOROLAC TROMETHAMINE 30 MG/ML IJ SOLN
INTRAMUSCULAR | Status: AC
  Filled 2021-06-01: qty 1

## 2021-06-01 MED ORDER — HEPARIN SODIUM (PORCINE) 1000 UNIT/ML IJ SOLN
INTRAMUSCULAR | Status: AC
  Filled 2021-06-01: qty 10

## 2021-06-01 MED ORDER — LIDOCAINE HCL (PF) 1 % IJ SOLN
INTRAMUSCULAR | Status: DC | PRN
  Administered 2021-06-01: 10 mL

## 2021-06-01 MED ORDER — SODIUM CHLORIDE 0.9 % IV SOLN: Freq: Once | INTRAVENOUS | Status: AC

## 2021-06-01 MED ORDER — ONDANSETRON HCL 4 MG/2ML IJ SOLN
INTRAMUSCULAR | Status: DC | PRN
  Administered 2021-06-01: 4 mg via INTRAVENOUS

## 2021-06-01 MED ORDER — MIDAZOLAM HCL 2 MG/2ML IJ SOLN
INTRAMUSCULAR | Status: DC | PRN
  Administered 2021-06-01 (×2): .5 mg via INTRAVENOUS

## 2021-06-01 MED ORDER — HEPARIN SODIUM (PORCINE) 1000 UNIT/ML IJ SOLN
INTRAMUSCULAR | Status: AC
Start: 2021-06-01 — End: 2021-06-01
  Filled 2021-06-01: qty 10

## 2021-06-01 MED ORDER — VERAPAMIL HCL 2.5 MG/ML IV SOLN
INTRAVENOUS | Status: AC
  Filled 2021-06-01: qty 2

## 2021-06-01 MED ORDER — SODIUM CHLORIDE 0.9 % IV BOLUS
INTRAVENOUS | Status: DC | PRN
  Administered 2021-06-01: 250 mL via INTRAVENOUS

## 2021-06-01 MED ORDER — IOHEXOL 300 MG/ML  SOLN
100.0000 mL | Freq: Once | INTRAMUSCULAR | Status: AC | PRN
Start: 2021-06-01 — End: 2021-06-01
  Administered 2021-06-01: 5 mL via INTRA_ARTERIAL

## 2021-06-01 MED ORDER — NITROGLYCERIN 1 MG/10 ML FOR IR/CATH LAB
INTRA_ARTERIAL | Status: AC
  Filled 2021-06-01: qty 10

## 2021-06-01 MED ORDER — HEPARIN SODIUM (PORCINE) 1000 UNIT/ML IJ SOLN
INTRAMUSCULAR | Status: DC | PRN
Start: 2021-06-01 — End: 2021-06-02
  Administered 2021-06-01: 1000 [IU] via INTRAVENOUS

## 2021-06-01 MED ORDER — SODIUM CHLORIDE 0.9 % IV SOLN: INTRAVENOUS | Status: AC

## 2021-06-01 MED ORDER — LIDOCAINE HCL 1 % IJ SOLN
INTRAMUSCULAR | Status: AC
Start: 2021-06-01 — End: 2021-06-01
  Filled 2021-06-01: qty 20

## 2021-06-01 MED ORDER — MIDAZOLAM HCL 2 MG/2ML IJ SOLN
INTRAMUSCULAR | Status: AC
  Filled 2021-06-01: qty 2

## 2021-06-01 MED ORDER — IOHEXOL 300 MG/ML  SOLN
100.0000 mL | Freq: Once | INTRAMUSCULAR | Status: AC | PRN
  Administered 2021-06-01: 75 mL via INTRA_ARTERIAL

## 2021-06-01 NOTE — Sedation Documentation (Signed)
Evolving hematoma noted in right groin and SBP trends decreasing to the 90s. Manual pressure applied to puncture site on right groin and NaCl bolus started. Pt awake and responds appropriately at this time. Dr. Estanislado Pandy notified.  ?

## 2021-06-01 NOTE — Sedation Documentation (Signed)
Pt transported to Short stay via stretcher accompanied by RN. Anisha RN at bedside to receive pt. Right groin level 0. Bilateral lower distal pulses present via doppler. Pt awake alert and oriented. No s/s of distress at this time.  ?

## 2021-06-01 NOTE — Procedures (Signed)
INR.  ?$ vessel cerebral arteriogram ?RT CFA approach . ?Findings. ?Approx 6.22mm x 5.5 mm RT PCOM region aneurysm. ?Approx 3.99mmx 2.8 mm basilar  tip ? Aneurysm. 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 ?Lt VA occluded at C2  ?RT VA suboptimally visualized but grossly patent to the skull base with flow noted in the basilar artery. ? ?S.Areona Homer MD ?

## 2021-06-01 NOTE — H&P (Signed)
Chief Complaint: Intracranial aneurysm  Referring Physician(s): Shon Millet  Supervising Physician: Julieanne Cotton  Patient Status: [facility] - Out-pt  History of Present Illness: [name] is a 81 y.o. female with medical issues including vertigo, CKD stage 3, GERD, and glaucoma.  She complains of dizziness, gait imbalance, and falls with started sometime in September last year.  She describes her symptoms as lightheadedness and visual blurring which are transient and brought on with sudden movements of the neck, and also on sitting up or standing up.   CTA done 04/21/21 showed= Severe and progressive atherosclerosis in the head and neck since 2017: 1. 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 High-grade narrowing in the bilateral cervical vertebrals which is progressed from 2017. 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 Fetal type posterior cerebral arteries but progressive right P2 segment stenosis. 2. New moderate bilateral M1 segment stenoses. Branch atherosclerosis is extensive.   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 This aneurysm is notably lobulated 2. 2 mm at the right carotid terminus 3. 1 mm at the anterior communicating artery.   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  She was seen by Dr. Corliss Skains on 04/25/2021 for evaluation.  She is here today for diagnostic cerebral angiography.  She is NPO. No nausea/vomiting. No Fever/chills. ROS negative.   Past Medical History:  Diagnosis Date   Benign paroxysmal vertigo, unspecified ear 06/07/2019   CKD (chronic kidney disease), stage III (HCC)    GERD (gastroesophageal reflux disease)    Glaucoma     Past Surgical History:  Procedure Laterality Date   ABDOMINAL HYSTERECTOMY     BACK  SURGERY     cataract surgery     COLONOSCOPY N/A 08/03/2015   Procedure: COLONOSCOPY;  Surgeon: Carman Ching, MD;  Location: [facility] ENDOSCOPY;  Service: Endoscopy;  Laterality: N/A;   ESOPHAGOGASTRODUODENOSCOPY N/A 07/31/2015   Procedure: ESOPHAGOGASTRODUODENOSCOPY (EGD);  Surgeon: Carman Ching, MD;  Location: [facility] ENDOSCOPY;  Service: Endoscopy;  Laterality: N/A;   IR RADIOLOGIST EVAL & MGMT  04/26/2021   OOPHORECTOMY     TOTAL HIP ARTHROPLASTY     URETER SURGERY      Allergies: Codeine, Gabapentin, Meclizine, Requip [ropinirole hcl], and Statins  Medications: Prior to Admission medications   Medication Sig Start Date End Date Taking? Authorizing Provider  Acetaminophen (TYLENOL) 325 MG CAPS Take 650 mg by mouth every 6 (six) hours as needed (pain).   Yes [provider]  Alirocumab (PRALUENT) 75 MG/ML SOAJ Inject 1 pen into the skin every 14 (fourteen) days. 05/24/21  Yes Wendall Stade, MD  amLODipine (NORVASC) 10 MG tablet Take 1 tablet (10 mg total) by mouth daily. Patient taking differently: Take 10 mg by mouth at bedtime. 04/12/21  Yes [name], MD  ARTIFICIAL TEAR SOLUTION OP Place 1 drop into both eyes daily as needed (dry eyes).   Yes [provider]  clopidogrel (PLAVIX) 75 MG tablet Take 1 tablet (75 mg total) by mouth daily. 04/12/21  Yes [name], MD  cyanocobalamin (,VITAMIN B-12,) 1000 MCG/ML injection Inject 1 mL (1,000 mcg total) into the muscle every 30 (thirty) days. 05/22/21  Yes [name], MD  D3-50 1.25 MG (50000 UT) capsule TAKE 1 CAPSULE BY MOUTH ONE TIME PER WEEK 04/15/21  Yes [name],  MD  furosemide (LASIX) 20 MG tablet TAKE 1 TABLET (20 MG TOTAL) BY MOUTH DAILY. TAKING EVERY DAY FOR SWELLING Patient taking differently: Take 20 mg by mouth daily as needed for edema. 04/20/21  Yes [name], MD  latanoprost (XALATAN) 0.005 % ophthalmic solution Place 1 drop into both eyes at bedtime. 12/09/18   Yes [provider]  loratadine (CLARITIN) 10 MG tablet Take 10 mg by mouth daily as needed for allergies.   Yes [provider]  Multiple Vitamins-Minerals (PRESERVISION AREDS 2) CAPS Take 1 capsule by mouth daily.   Yes [provider]  OVER THE COUNTER MEDICATION Apply 1 application. topically daily as needed (pain). Hempvana pain relief cream   Yes [provider]  valsartan (DIOVAN) 160 MG tablet Take 2 tablets (320 mg total) by mouth at bedtime. Patient taking differently: Take 160 mg by mouth at bedtime. 05/07/21  Yes [name], MD  vortioxetine HBr (TRINTELLIX) 10 MG TABS tablet Take 1 tablet (10 mg total) by mouth daily. Patient taking differently: Take 10 mg by mouth at bedtime. 04/12/21  Yes [name], MD  calcium carbonate (TUMS - DOSED IN MG ELEMENTAL CALCIUM) 500 MG chewable tablet Chew 1 tablet by mouth daily as needed for indigestion or heartburn.    [provider]  pantoprazole (PROTONIX) 40 MG tablet TAKE 1 TABLET BY MOUTH TWICE A DAY Patient not taking: Reported on 05/30/2021 12/13/20   [name], MD     Family History  Problem Relation Age of Onset   Hypertension Mother    Hypertension Father    Heart attack Father    Hypertension Brother    Diabetes Brother     Social History   Socioeconomic History   Marital status: Married    Spouse name: Not on file   Number of children: Not on file   Years of education: Not on file   Highest education level: Not on file  Occupational History   Not on file  Tobacco Use   Smoking status: Never   Smokeless tobacco: Never  Vaping Use   Vaping Use: Never used  Substance and Sexual Activity   Alcohol use: No   Drug use: No   Sexual activity: Not Currently  Other Topics Concern   Not on file  Social History Narrative   Not on file   Social Determinants of Health   Financial Resource Strain: Low Risk    Difficulty of Paying Living  Expenses: Not hard at all  Food Insecurity: No Food Insecurity   Worried About Programme researcher, broadcasting/film/video in the Last Year: Never true   Ran Out of Food in the Last Year: Never true  Transportation Needs: No Transportation Needs   Lack of Transportation (Medical): No   Lack of Transportation (Non-Medical): No  Physical Activity: Not on file  Stress: Not on file  Social Connections: Not on file     Review of Systems: A 12 point ROS discussed and pertinent positives are indicated in the HPI above.  All other systems are negative.  Review of Systems  Vital Signs: BP (!) 154/83    Pulse 78    Temp 98.4 F (36.9 C) (Oral)    Resp 16    Ht 5' (1.524 m)    Wt 160 lb (72.6 kg)    SpO2 98%    BMI 31.25 kg/m   Physical Exam Vitals reviewed.  Constitutional:      Appearance: Normal appearance.  HENT:     Head: Normocephalic and atraumatic.  Eyes:     Extraocular Movements: Extraocular movements intact.  Cardiovascular:     Rate and Rhythm: Normal rate and regular rhythm.  Pulmonary:     Effort: Pulmonary effort is normal. No respiratory distress.     Breath sounds: Normal breath sounds.  Abdominal:     Palpations: Abdomen is soft.  Musculoskeletal:        General: Normal range of motion.     Cervical back: Normal range of motion.  Skin:    General: Skin is warm and dry.  Neurological:     General: No focal deficit present.     Mental Status: She is alert and oriented to person, place, and time.  Psychiatric:        Mood and Affect: Mood normal.        Behavior: Behavior normal.        Thought Content: Thought content normal.        Judgment: Judgment normal.    Imaging: CLINICAL DATA:  Question vertebrobasilar insufficiency. Question CVA with right-sided weakness   EXAM: CT ANGIOGRAPHY HEAD AND NECK   TECHNIQUE: Multidetector CT imaging of the head and neck was performed using the standard protocol during bolus administration of intravenous contrast. Multiplanar CT image  reconstructions and MIPs were obtained to evaluate the vascular anatomy. Carotid stenosis measurements (when applicable) are obtained utilizing NASCET criteria, using the distal internal carotid diameter as the denominator.   RADIATION DOSE REDUCTION: This exam was performed according to the departmental dose-optimization program which includes automated exposure control, adjustment of the mA and/or kV according to patient size and/or use of iterative reconstruction technique.   CONTRAST:  75mL ISOVUE-370 IOPAMIDOL (ISOVUE-370) INJECTION 76%   COMPARISON:  06/10/2015 CTA and brain MRI.   FINDINGS: CT HEAD FINDINGS   Brain: Interval left occipital infarct with faint cortical enhancement compatible with subacute timing. Pre-existing additional posterior circulation infarcts in the bilateral cerebellum and brainstem. Extensive chronic small vessel ischemia in the hemispheric white matter with chronic deep gray nuclei lacunes asymmetric to the left. No evidence of acute hemorrhage, hydrocephalus, or collection. Generalized brain atrophy.   Vascular: See below   Skull: Negative   Sinuses: Negative   Orbits: Bilateral cataract resection   Review of the MIP images confirms the above findings   CTA NECK FINDINGS   Aortic arch: Atheromatous plaque including irregular thrombus at the isthmus. Two vessel branching pattern   Right carotid system: Tortuous vessels. Atheromatous plaque which is greatest and mixed density at the bifurcation without flow limiting stenosis or ulceration. ICA tortuosity with vessel kink.   Left carotid system: Atheromatous plaque which is mixed density and fairly extensive at the bifurcation, although no flow limiting stenosis or ulceration.   Vertebral arteries: Proximal subclavian atherosclerosis on the left with ulcerated plaque in 2 locations along the inferior wall that is new from prior proximal left subclavian stenosis measures 70%. Calcified  plaque at the proximal right subclavian artery narrows the lumen to 1 mm on coronal reformats, at least 90% stenosis. Mixed density plaque at the right vertebral origin with subjective high-grade stenosis, progressed. Extensive plaque affecting bilateral vertebral arteries with high-grade right V2 and left V2/3 stenosis. Vertebral narrowings are diffusely progressed from prior.   Skeleton: Generalized cervical spine degeneration. No acute or aggressive finding   Other neck: No acute finding   Upper chest: No acute finding   Review of the MIP images confirms the above findings  CTA HEAD FINDINGS   Anterior circulation: Extensive atheromatous plaque along the carotid siphons. Aplastic left A1 segment. Branch atherosclerosis is progressed with new bilateral M1 segment stenosis which measures in the 40-50% range.   Lobulated lateral and posteriorly projecting aneurysm from the posterior genu of the right ICA measuring 8 mm in maximal by 4 mm in thickness. Additional superiorly projecting right carotid terminus aneurysm measuring 2 mm. Early upward pointing A-comm region aneurysm which measures 1 mm.   Posterior circulation: Essentially no flow in the left vertebral artery, unchanged. Heavily diseased right vertebral artery with bulky plaque and occlusion before the basilar, stable to mildly progressed. Short segment of basilar filling with faintly enhancing right AICA followed by a subsequent occlusion and faint reconstitution of the distal basilar, unchanged from prior. There are fetal type bilateral posterior cerebral arteries which show atheromatous irregularity and at least 50% stenosis at the right P2 segment, progressed. Negative for aneurysm.   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 Flow reducing  stenosis of the right more than left proximal subclavian artery with interval plaque ulceration on the left. High-grade narrowing in the bilateral cervical vertebrals which is progressed from 2017. 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 Fetal type posterior cerebral arteries but progressive right P2 segment stenosis. 2. New moderate bilateral M1 segment stenoses. Branch atherosclerosis is extensive.   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 This aneurysm is notably lobulated 2. 2 mm at the right carotid terminus 3. 1 mm at the anterior communicating artery.   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     Electronically Signed   By: Tiburcio Pea M.D.   On: 04/21/2021 05:14  Labs:  CBC: Recent Labs    09/12/20 1024 02/06/21 1438 03/14/21 1051 06/01/21 0825  WBC 6.1 6.4 3.8 5.8  HGB 12.5 12.6 12.9 12.9  HCT 39.1 38.7 39.9 39.7  PLT 346 325 282 351    COAGS: Recent Labs    06/01/21 0825  INR 1.0    BMP: Recent Labs    09/12/20 1024 02/06/21 1438 03/14/21 1051 06/01/21 0825  NA 140 142 143 139  K 4.7 4.5 4.1 4.2  CL 106 106 107* 109  CO2 23 22 21 24   GLUCOSE 101* 129* 111* 111*  BUN 16 19 18 20   CALCIUM 10.1 9.9 9.5 9.8  CREATININE 1.02* 1.08* 1.12* 1.06*  GFRNONAA  --   --   --  53*    LIVER FUNCTION TESTS: Recent Labs    09/12/20 1024 02/06/21 1438 03/14/21 1051  BILITOT 0.3 0.2 0.4  AST 12 13 14   ALT 1 12 10   ALKPHOS 128* 123* 118  PROT 6.7 6.7 7.0  ALBUMIN 4.1 4.2 4.1    TUMOR MARKERS: No results for input(s): AFPTM, CEA, CA199, CHROMGRNA in the last 8760 hours.  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 Chronic intracranial occlusions at the right vertebral and mid  basilar arteries. Fetal type posterior cerebral arteries but progressive right P2 segment stenosis. New moderate bilateral M1 segment stenoses. Branch atherosclerosis is extensive.  Will proceed with  diagnostic cerebral angiography today by Dr. Corliss Skains.  Risks and benefits of cerebral angiogram with intervention were discussed with the patient including, but not limited to bleeding, infection, vascular injury, contrast induced renal failure, stroke or even death.  This interventional procedure involves the use of X-rays and because of the nature of the planned procedure, it is possible that we will have prolonged use of X-ray fluoroscopy.  Potential radiation risks to you include (but are not limited to) the following: - A slightly elevated risk for cancer  several years later in life. This risk is typically less than 0.5% percent. This risk is low in comparison to the normal incidence of human cancer, which is 33% for women and 50% for men according to the [employer] Society. - Radiation induced injury can include skin redness, resembling a rash, tissue breakdown / ulcers and hair loss (which can be temporary or permanent).   The likelihood of either of these occurring depends on the difficulty of the procedure and whether you are sensitive to radiation due to previous procedures, disease, or genetic conditions.   IF your procedure requires a prolonged use of radiation, you will be notified and given written instructions for further action.  It is your responsibility to monitor the irradiated area for the 2 weeks following the procedure and to notify your physician if you are concerned that you have suffered a radiation induced injury.    All of the patient's questions were answered, patient is agreeable to proceed.  Consent signed and in chart.  Thank you for allowing our service to participate in [name] 's care.  Electronically Signed: Gwynneth Macleod, PA-C   06/01/2021,  9:38 AM      I spent a total of    15 Minutes in face to face in clinical consultation, greater than 50% of which was counseling/coordinating care for cerebral angiography.

## 2021-06-01 NOTE — Sedation Documentation (Signed)
5 Fr exoseal deployed to right groin ?

## 2021-06-04 ENCOUNTER — Telehealth: Payer: Self-pay | Admitting: Student

## 2021-06-04 ENCOUNTER — Emergency Department (HOSPITAL_BASED_OUTPATIENT_CLINIC_OR_DEPARTMENT_OTHER): Payer: Medicare HMO

## 2021-06-04 ENCOUNTER — Telehealth: Payer: Self-pay

## 2021-06-04 ENCOUNTER — Emergency Department (HOSPITAL_COMMUNITY)
Admission: EM | Admit: 2021-06-04 | Discharge: 2021-06-04 | Disposition: A | Discharge status: Home / Self Care | Payer: Medicare HMO | Attending: Emergency Medicine | Admitting: Emergency Medicine

## 2021-06-04 ENCOUNTER — Emergency Department (HOSPITAL_COMMUNITY): Payer: Medicare HMO

## 2021-06-04 DIAGNOSIS — M79604 Pain in right leg: Secondary | ICD-10-CM | POA: Diagnosis not present

## 2021-06-04 DIAGNOSIS — R1031 Right lower quadrant pain: Secondary | ICD-10-CM | POA: Diagnosis not present

## 2021-06-04 DIAGNOSIS — Z79899 Other long term (current) drug therapy: Secondary | ICD-10-CM | POA: Diagnosis not present

## 2021-06-04 DIAGNOSIS — Z7901 Long term (current) use of anticoagulants: Secondary | ICD-10-CM | POA: Insufficient documentation

## 2021-06-04 DIAGNOSIS — G8918 Other acute postprocedural pain: Secondary | ICD-10-CM | POA: Insufficient documentation

## 2021-06-04 DIAGNOSIS — R710 Precipitous drop in hematocrit: Secondary | ICD-10-CM | POA: Insufficient documentation

## 2021-06-04 DIAGNOSIS — N189 Chronic kidney disease, unspecified: Secondary | ICD-10-CM | POA: Insufficient documentation

## 2021-06-04 DIAGNOSIS — M7989 Other specified soft tissue disorders: Secondary | ICD-10-CM | POA: Diagnosis present

## 2021-06-04 DIAGNOSIS — M79661 Pain in right lower leg: Secondary | ICD-10-CM

## 2021-06-04 LAB — COMPREHENSIVE METABOLIC PANEL
ALT: 12 U/L (ref 0–44)
AST: 15 U/L (ref 15–41)
Albumin: 3.4 g/dL — ABNORMAL LOW (ref 3.5–5.0)
Alkaline Phosphatase: 86 U/L (ref 38–126)
Anion gap: 8 (ref 5–15)
BUN: 14 mg/dL (ref 8–23)
CO2: 21 mmol/L — ABNORMAL LOW (ref 22–32)
Calcium: 9.8 mg/dL (ref 8.9–10.3)
Chloride: 109 mmol/L (ref 98–111)
Creatinine, Ser: 0.97 mg/dL (ref 0.44–1.00)
GFR, Estimated: 59 mL/min — ABNORMAL LOW (ref >60)
Glucose, Bld: 109 mg/dL — ABNORMAL HIGH (ref 70–99)
Potassium: 3.6 mmol/L (ref 3.5–5.1)
Sodium: 138 mmol/L (ref 135–145)
Total Bilirubin: 0.4 mg/dL (ref 0.3–1.2)
Total Protein: 6.5 g/dL (ref 6.5–8.1)

## 2021-06-04 LAB — CBC WITH DIFFERENTIAL/PLATELET
Abs Immature Granulocytes: 0.01 10*3/uL (ref 0.00–0.07)
Basophils Absolute: 0 10*3/uL (ref 0.0–0.1)
Basophils Relative: 1 %
Eosinophils Absolute: 0.1 10*3/uL (ref 0.0–0.5)
Eosinophils Relative: 2 %
HCT: 35.7 % — ABNORMAL LOW (ref 36.0–46.0)
Hemoglobin: 11.9 g/dL — ABNORMAL LOW (ref 12.0–15.0)
Immature Granulocytes: 0 %
Lymphocytes Relative: 24 %
Lymphs Abs: 1.4 10*3/uL (ref 0.7–4.0)
MCH: 31 pg (ref 26.0–34.0)
MCHC: 33.3 g/dL (ref 30.0–36.0)
MCV: 93 fL (ref 80.0–100.0)
Monocytes Absolute: 0.6 10*3/uL (ref 0.1–1.0)
Monocytes Relative: 11 %
Neutro Abs: 3.7 10*3/uL (ref 1.7–7.7)
Neutrophils Relative %: 62 %
Platelets: 308 10*3/uL (ref 150–400)
RBC: 3.84 MIL/uL — ABNORMAL LOW (ref 3.87–5.11)
RDW: 12.4 % (ref 11.5–15.5)
WBC: 5.8 10*3/uL (ref 4.0–10.5)
nRBC: 0 % (ref 0.0–0.2)

## 2021-06-04 NOTE — Progress Notes (Signed)
Lower extremity venous and rt groin pseudo check has been completed.  ? ?Preliminary results in CV Proc.  ? ?Lewis Grivas Mitsuko Luera ?06/04/2021 4:23 PM    ?

## 2021-06-04 NOTE — ED Notes (Signed)
Pt able to ambulate with RW from room to desk and back into room with no c/o pain. ?

## 2021-06-04 NOTE — Telephone Encounter (Signed)
Interventional Radiology Brief Note: ? ?Patient's daughter, [name], called to report leg swelling.  States that since procedure on Friday, patient has developed painful swelling "all the way down her leg."  She is having difficulty with mobility.  Patient does have a history of DVT but had recently stopped all anticoagulation (eliquis and aspirin) within the past 6+ months due to multiple comorbidities including aneurysms.  Santiago Glad reports there was a small amount of oozing on Saturday that did not require a dressing change and that otherwise patient has been stable from her procedure.  She denies dizziness, lightheadness, new shortness of breath, or bruising.  States tenderness and swelling are the primary complaint.  She reports she called her PCP office, however she was instructed to follow-up with our office.  Discussed with Dr. Estanislado Pandy who recommends patient proceed to ED for assessment for possible DVT.  ?Information relayed to Santiago Glad who states she will bring the patient today.  ? ?Brynda Greathouse, MS RD PA-C ? ? ?

## 2021-06-04 NOTE — ED Triage Notes (Signed)
Patient BIB family for evaluation of right leg edema, had right leg arteriogram in IR three days ago, family states earlier today the patient was complaining of right leg pain and so they called IR physician and were instructed to come to ED for evaluation. Patient denies pain and has no complaints at this time. ?

## 2021-06-04 NOTE — ED Notes (Signed)
Pt in CT.

## 2021-06-04 NOTE — Telephone Encounter (Signed)
Daughter, [name] calling as patient had angio on 3/10. They were given special instructions for 24 hours after procedure. Daughter is concerned as patient did not take plavix the next morning as instructed, procedure site was not left to air, and legs were not left elevated as instructed. Today patient is having leg pain and family does state it looks as if it is bruised. They deny heat to area and patient denies burning feeling. Due to patient's hx instructed family to take patient to hospital. [name].  ? ?[name] 06/04/21 11:29 AM ? ?

## 2021-06-04 NOTE — Telephone Encounter (Signed)
Will have [place] call patient tomorrow to check on ER visit and if needs anything new. Patient on Prasugrel and Clopidogrel, will try to see which patient needs ?

## 2021-06-04 NOTE — ED Provider Notes (Cosign Needed)
[facility] EMERGENCY DEPARTMENT Provider Note   CSN: [id number] Arrival date & time: 06/04/21  1403     History  Chief Complaint  Patient presents with   Leg Swelling    [name] is a 81 y.o. female.  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 Corliss Skains on 06/01/21 presents to the ED for c/o RLE pain and "heaviness". Patient awoke this AM reporting difficulty moving or lifting her RLE. States she has a degree of this at baseline, ambulates with a walker, but symptoms today were worse. Right now the patient states she is feeling better. She is on Plavix and held this medication for her procedure. Was advised to restart this on Saturday, but forgot to take her dose until Sunday midday. Daughter of patient reached out to Dr. Corliss Skains today who advised patient proceed to ED for assessment for possible DVT. Patient has had minimal oozing from her access site, but no fevers, dizziness, lightheadness, new shortness of breath, or worsening bruising.  The history is provided by the patient and a relative. No language interpreter was used.      Home Medications Prior to Admission medications   Medication Sig Start Date End Date Taking? Authorizing Provider  Acetaminophen (TYLENOL) 325 MG CAPS Take 650 mg by mouth every 6 (six) hours as needed (pain).    [provider]  Alirocumab (PRALUENT) 75 MG/ML SOAJ Inject 1 pen into the skin every 14 (fourteen) days. 05/24/21   Wendall Stade, MD  amLODipine (NORVASC) 10 MG tablet Take 1 tablet (10 mg total) by mouth daily. Patient taking differently: Take 10 mg by mouth at bedtime. 04/12/21   [name], MD  ARTIFICIAL TEAR SOLUTION OP Place 1 drop into both eyes daily as needed (dry eyes).    [provider]  calcium carbonate (TUMS - DOSED IN MG  ELEMENTAL CALCIUM) 500 MG chewable tablet Chew 1 tablet by mouth daily as needed for indigestion or heartburn.    [provider]  clopidogrel (PLAVIX) 75 MG tablet Take 1 tablet (75 mg total) by mouth daily. 04/12/21   [name], MD  cyanocobalamin (,VITAMIN B-12,) 1000 MCG/ML injection Inject 1 mL (1,000 mcg total) into the muscle every 30 (thirty) days. 05/22/21   [name], MD  D3-50 1.25 MG (50000 UT) capsule TAKE 1 CAPSULE BY MOUTH ONE TIME PER WEEK 04/15/21   [name], MD  furosemide (LASIX) 20 MG tablet TAKE 1 TABLET (20 MG TOTAL) BY MOUTH DAILY. TAKING EVERY DAY FOR SWELLING Patient taking differently: Take 20 mg by mouth daily as needed for edema. 04/20/21   [name], MD  latanoprost (XALATAN) 0.005 % ophthalmic solution Place 1 drop into both eyes at bedtime. 12/09/18   [provider]  loratadine (CLARITIN) 10 MG tablet Take 10 mg by mouth daily as needed for allergies.    [provider]  Multiple Vitamins-Minerals (PRESERVISION AREDS 2) CAPS Take 1 capsule by mouth daily.    [provider]  OVER THE COUNTER MEDICATION Apply 1 application. topically daily as needed (pain). Hempvana pain relief cream    [provider]  pantoprazole (PROTONIX) 40 MG tablet TAKE 1 TABLET BY MOUTH TWICE A DAY Patient not taking: Reported on 05/30/2021 12/13/20   [name], MD  valsartan (DIOVAN) 160 MG tablet Take 2  tablets (320 mg total) by mouth at bedtime. Patient taking differently: Take 160 mg by mouth at bedtime. 05/07/21   Lillard Anes, MD  vortioxetine HBr (TRINTELLIX) 10 MG TABS tablet Take 1 tablet (10 mg total) by mouth daily. Patient taking differently: Take 10 mg by mouth at bedtime. 04/12/21   Lillard Anes, MD      Allergies    Codeine, Gabapentin, Meclizine, Requip [ropinirole hcl], and Statins    Review of Systems   Review of Systems Ten systems reviewed and are  negative for acute change, except as noted in the HPI.    Physical Exam Updated Vital Signs BP (!) 153/88    Pulse 84    Temp 98 F (36.7 C) (Oral)    Resp 20    SpO2 99%   Physical Exam Vitals and nursing note reviewed.  Constitutional:      General: She is not in acute distress.    Appearance: She is well-developed. She is not diaphoretic.     Comments: Nontoxic appearing and in NAD  HENT:     Head: Normocephalic and atraumatic.  Eyes:     General: No scleral icterus.    Conjunctiva/sclera: Conjunctivae normal.  Cardiovascular:     Rate and Rhythm: Normal rate and regular rhythm.     Pulses: Normal pulses.  Pulmonary:     Effort: Pulmonary effort is normal. No respiratory distress.     Breath sounds: No stridor. No wheezing.     Comments: Respirations even and unlabored Musculoskeletal:        General: Normal range of motion.     Cervical back: Normal range of motion.     Comments: Able to flex at hip against gravity with BLE and maintain leg off bed for >5 seconds. Ecchymosis noted to the RLE; soft compartments. Extremity is warm and well perfused. DP pulse palpable in the RLE. Sensation to light touch intact. No erythema, induration, purulence at or surrounding R femoral access site from recent bilateral common carotid and innominate angiography.  Skin:    General: Skin is warm and dry.     Coloration: Skin is not pale.     Findings: No erythema or rash.  Neurological:     Mental Status: She is alert and oriented to person, place, and time.     Coordination: Coordination normal.  Psychiatric:        Behavior: Behavior normal.         ED Results / Procedures / Treatments   Labs (all labs ordered are listed, but only abnormal results are displayed) Labs Reviewed  COMPREHENSIVE METABOLIC PANEL - Abnormal; Notable for the following components:      Result Value   CO2 21 (*)    Glucose, Bld 109 (*)    Albumin 3.4 (*)    GFR, Estimated 59 (*)    All other  components within normal limits  CBC WITH DIFFERENTIAL/PLATELET - Abnormal; Notable for the following components:   RBC 3.84 (*)    Hemoglobin 11.9 (*)    HCT 35.7 (*)    All other components within normal limits    EKG None  Radiology CT Head Wo Contrast  Result Date: 06/04/2021 CLINICAL DATA:  Neuro deficit.  Right leg pain and weakness EXAM: CT HEAD WITHOUT CONTRAST TECHNIQUE: Contiguous axial images were obtained from the base of the skull through the vertex without intravenous contrast. RADIATION DOSE REDUCTION: This exam was performed according to the departmental dose-optimization program which includes  automated exposure control, adjustment of the mA and/or kV according to patient size and/or use of iterative reconstruction technique. COMPARISON:  CT head 04/19/2021 FINDINGS: Brain: No acute intracranial hemorrhage, mass effect, or herniation. No extra-axial fluid collections. No evidence of acute territorial infarct. No hydrocephalus. Mild cortical volume loss. Extensive patchy hypodensities throughout the periventricular and subcortical white matter, likely secondary to chronic microvascular ischemic changes. Small old lacunar infarcts in the bilateral basal ganglia. Old infarct in the left occipital lobe. Old infarct in the left cerebellum. Vascular: Calcified plaques in the carotid siphons. Skull: Normal. Negative for fracture or focal lesion. Sinuses/Orbits: No acute finding. Other: None. IMPRESSION: No acute intracranial process identified. 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 Electronically Signed   By: Ofilia Neas M.D.   On: 06/04/2021 15:53   VAS [place] GROIN PSEUDOANEURYSM  Result Date: 06/04/2021  ARTERIAL PSEUDOANEURYSM  Patient Name:  [name]  Date of Exam:   06/04/2021 Medical Rec #: [id number]       Accession #:    [id number] Date of Birth: 1940/06/17       Patient Gender: F Patient Age:   87 years Exam Location:  [facility] Procedure:      VAS [place]  GROIN PSEUDOANEURYSM Referring Phys: Coral Ceo --------------------------------------------------------------------------------  Exam: Right groin Indications: Patient complains of groin pain. History: Angio done 06/01/21. Comparison Study: no prior Performing Technologist: Archie Patten RVS  Examination Guidelines: A complete evaluation includes B-mode imaging, spectral Doppler, color Doppler, and power Doppler as needed of all accessible portions of each vessel. Bilateral testing is considered an integral part of a complete examination. Limited examinations for reoccurring indications may be performed as noted. +------------+----------+---------+------+----------+  Right Duplex PSV (cm/s) Waveform  Plaque Comment(s)  +------------+----------+---------+------+----------+  CFA             129     triphasic                    +------------+----------+---------+------+----------+  Prox SFA        112     triphasic                    +------------+----------+---------+------+----------+  Summary: No evidence of pseudoaneurysm, AVF or DVT  Diagnosing physician: Orlie Pollen Electronically signed by Orlie Pollen on 06/04/2021 at 7:32:17 PM.   --------------------------------------------------------------------------------    Final    VAS [place] LOWER EXTREMITY VENOUS (DVT)  Result Date: 06/04/2021  Lower Venous DVT Study Patient Name:  [name]  Date of Exam:   06/04/2021 Medical Rec #: [id number]       Accession #:    [id number] Date of Birth: [birth year]       Patient Gender: F Patient Age:   45 years Exam Location:  [facility] Procedure:      VAS [place] LOWER EXTREMITY VENOUS (DVT) Referring Phys: Marijean Bravo COUTURE --------------------------------------------------------------------------------  Indications: Pain. Other Indications: Recent angio done 06/01/21. Comparison Study: no prior Performing Technologist: Archie Patten RVS  Examination Guidelines: A complete evaluation includes B-mode imaging,  spectral Doppler, color Doppler, and power Doppler as needed of all accessible portions of each vessel. Bilateral testing is considered an integral part of a complete examination. Limited examinations for reoccurring indications may be performed as noted. The reflux portion of the exam is performed with the patient in reverse Trendelenburg.  +---------+---------------+---------+-----------+----------+--------------+  RIGHT     Compressibility Phasicity Spontaneity Properties Thrombus Aging  +---------+---------------+---------+-----------+----------+--------------+  CFV  Full            Yes       Yes                                    +---------+---------------+---------+-----------+----------+--------------+  SFJ       Full                                                             +---------+---------------+---------+-----------+----------+--------------+  FV Prox   Full                                                             +---------+---------------+---------+-----------+----------+--------------+  FV Mid    Full                                                             +---------+---------------+---------+-----------+----------+--------------+  FV Distal Full                                                             +---------+---------------+---------+-----------+----------+--------------+  PFV       Full                                                             +---------+---------------+---------+-----------+----------+--------------+  POP       Full            Yes       Yes                                    +---------+---------------+---------+-----------+----------+--------------+  PTV       Full                                                             +---------+---------------+---------+-----------+----------+--------------+  PERO      Full                                                             +---------+---------------+---------+-----------+----------+--------------+    +----+---------------+---------+-----------+----------+--------------+  LEFT Compressibility Phasicity Spontaneity Properties Thrombus Aging  +----+---------------+---------+-----------+----------+--------------+  CFV  Full            Yes       Yes                                    +----+---------------+---------+-----------+----------+--------------+     Summary: RIGHT: - There is no evidence of deep vein thrombosis in the lower extremity.  - No cystic structure found in the popliteal fossa.  LEFT: - No evidence of common femoral vein obstruction.  *See table(s) above for measurements and observations. Electronically signed by Orlie Pollen on 06/04/2021 at 7:32:27 PM.    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 High-grade narrowing in the bilateral cervical vertebrals which is progressed from 2017. 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 Fetal type posterior cerebral arteries but progressive right P2 segment stenosis. 2. New moderate bilateral M1 segment stenoses. Branch atherosclerosis is extensive.   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 This aneurysm is notably lobulated 2. 2 mm at the right carotid terminus 3. 1 mm at the anterior communicating artery.   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 [id number] Page placed to Dr. Estanislado Pandy to discuss patient case and confirm disposition. Anticipate discharge and outpatient follow up. [id number] Spoke with Dr. Estanislado Pandy who is  reassured by the patient's ultrasound findings. Advises patient rest x 1 week; may continue Plavix. [KH]    Clinical Course User Index [KH] Antonietta Breach, PA-C                           Medical Decision Making  This patient presents to the ED for concern of RLE pain and "heaviness" s/p cerebral angiography w/R femoral aa approach, this involves an extensive number of treatment options, and is a complaint that carries with it a high risk of complications and morbidity.  The differential diagnosis includes postprocedural pain vs DVT vs pseudoaneurysm vs arterial occlusion.   Co morbidities that complicate the patient evaluation  Hx DVT CKD   Additional history obtained:  Additional history obtained from daughter at bedside External records from outside source obtained and reviewed including pre-procedural HPI from 06/01/21   Lab Tests:  I Ordered, and personally interpreted labs.  The pertinent results include:  Hgb 11.9    Imaging Studies ordered:  I ordered imaging studies including CT head, venous duplex study, arterial US groin  I independently visualized and interpreted imaging which showed no acute intracranial abnormality. No DVT, AVF or pseudoaneurysm.  I agree with the radiologist interpretation   Cardiac Monitoring:  The patient was maintained on a cardiac monitor.  I personally viewed and interpreted the cardiac monitored which showed an underlying rhythm of: NSR   Consultations Obtained:  I requested consultation with the Neurointerventional radiologist, and discussed lab and imaging findings as well as pertinent  plan - they recommend: rest and continued PCP follow up for recheck   Reevaluation:  After the interventions noted above, I reevaluated the patient and found that they have :improved   Social Determinants of Health:  Ambulatory with walker   Dispostion:  After consideration of the diagnostic results and the patients response to treatment, I  feel that the patent would benefit from outpatient PCP follow up for wound recheck. Patient advised to continue Plavix. Encouraged rest to assist in pain control and healing post procedure. Return precautions discussed and provided. Patient discharged in stable condition with no unaddressed concerns.   Patient seen and evaluated, also, by my attending Dr. Francia Greaves who is in agreement with this work-up, assessment, management plan, and patient's stability for discharge.        Final Clinical Impression(s) / ED Diagnoses Final diagnoses:  Other acute postprocedural pain    Rx / DC Orders ED Discharge Orders     None         Antonietta Breach, PA-C 06/04/21 2101

## 2021-06-04 NOTE — Discharge Instructions (Signed)
Your work up in the ED was reassuring. You may continue your daily Plavix as prescribed. Try to rest and elevate your leg to prevent worsening pain and bruising. Have your wound site rechecked by your primary care doctor in 1 week. Return for new or concerning symptoms. ?

## 2021-06-04 NOTE — ED Provider Triage Note (Signed)
Emergency Medicine Provider Triage Evaluation Note ? ?[name] , a 81 y.o. female  was evaluated in triage.  Pt complains of right leg pain and weakness, and numbness. She woke up with sxs. Had an angiogram a few days ago with Dr. Titus Dubin. Reports decreased sensation.  ? ?Review of Systems  ?Positive: Leg pain/weakness/numnbess ?Negative: Right arm weakness/numbess ? ?Physical Exam  ?BP (!) 185/81 (BP Location: Left Arm)   Pulse 80   Temp 98.7 ?F (37.1 ?C) (Oral)   Resp 16   SpO2 100%  ?Gen:   Awake, no distress   ?Resp:  Normal effort  ?MSK:   Moves extremities without difficulty  ?Other:  Clear speech, no facial droop, right proptosis (chronic), 5/5 strength to the bue, rle weakness and decreased sensation noted on exam. No focal TTP, compartments soft. DP pulse palpable. No swelling to the inguinal area but there is some old ecchymosis present. ? ?Medical Decision Making  ?Medically screening exam initiated at 2:33 PM.  Appropriate orders placed.  [name] was informed that the remainder of the evaluation will be completed by another provider, this initial triage assessment does not replace that evaluation, and the importance of remaining in the ED until their evaluation is complete. ? ? ?  ?Karrie Meres, PA-C ?06/04/21 1451 ? ?

## 2021-06-06 ENCOUNTER — Telehealth: Payer: Self-pay

## 2021-06-06 NOTE — Chronic Care Management (AMB) (Signed)
Called and lvm reminding pt of CPP appt on 3/17. ? ?[name], CMA ?Clinical Pharmacist Assistant  ?[phone]  ?

## 2021-06-07 ENCOUNTER — Telehealth: Payer: Self-pay | Admitting: *Deleted

## 2021-06-07 NOTE — Progress Notes (Signed)
Called patient's daughter to reschedule 06-08-2021 telephone visit with Artelia Laroche to April. ? ?Huey Romans CMA ?Clinical Pharmacist Assistant ?[phone] ? ?

## 2021-06-07 NOTE — Telephone Encounter (Signed)
Referral Transferred to Reece Levy, newly Embedded LCSW with [facility]. ? ?Danford Bad, BSW, MSW, LCSW  ?Licensed Clinical Social Worker  ?[facility]  ?Mailing Address-[address], [place], [place] [address] ?Physical Address-[address] [place], [place], [place] [address] ?Toll Free Main # [phone] ?Fax # [phone] ?Cell # [phone] ?[email or web address] ? ? ? ? ? ?  ?

## 2021-06-08 ENCOUNTER — Telehealth: Payer: Self-pay

## 2021-06-08 ENCOUNTER — Encounter: Payer: Self-pay | Admitting: Legal Medicine

## 2021-06-08 ENCOUNTER — Ambulatory Visit (INDEPENDENT_AMBULATORY_CARE_PROVIDER_SITE_OTHER): Payer: PRIVATE HEALTH INSURANCE | Admitting: Legal Medicine

## 2021-06-08 VITALS — BP 160/98 | HR 77 | Temp 98.7°F | Resp 16 | Ht 60.0 in | Wt 159.0 lb

## 2021-06-08 DIAGNOSIS — R471 Dysarthria and anarthria: Secondary | ICD-10-CM

## 2021-06-08 DIAGNOSIS — G8918 Other acute postprocedural pain: Secondary | ICD-10-CM

## 2021-06-08 DIAGNOSIS — I639 Cerebral infarction, unspecified: Secondary | ICD-10-CM | POA: Diagnosis not present

## 2021-06-08 DIAGNOSIS — I618 Other nontraumatic intracerebral hemorrhage: Secondary | ICD-10-CM | POA: Diagnosis not present

## 2021-06-08 NOTE — Progress Notes (Signed)
? ?Subjective:  ?Patient ID: [name], female    DOB: [birth year]  Age: 81 y.o. MRN: [id number] ? ?Chief Complaint  ?Patient presents with  ? Leg Pain  ? Follow-up  ? ? ?HPI: follow up ?  ?Patient went to Emergency Department [facility] on 06/04/2021.  Patient presented with leg swelling and heaviness. Patient awoke that morning with difficulty moving or lifting her RLE.  She is taking Plavix and held this medication for her procedure.  Arteriography 3/10, then went to ER 3/13 for leg pain negative DVT.  On 06/07/2021 she started having dysrthria right hand,  She now denies problems.extensive discussion with daughter. ?Current Outpatient Medications on File Prior to Visit  ?Medication Sig Dispense Refill  ? Acetaminophen (TYLENOL) 325 MG CAPS Take 650 mg by mouth every 6 (six) hours as needed (pain).    ? Alirocumab (PRALUENT) 75 MG/ML SOAJ Inject 1 pen into the skin every 14 (fourteen) days. 6 mL 3  ? amLODipine (NORVASC) 10 MG tablet Take 1 tablet (10 mg total) by mouth daily. (Patient taking differently: Take 10 mg by mouth at bedtime.) 90 tablet 1  ? ARTIFICIAL TEAR SOLUTION OP Place 1 drop into both eyes daily as needed (dry eyes).    ? calcium carbonate (TUMS - DOSED IN MG ELEMENTAL CALCIUM) 500 MG chewable tablet Chew 1 tablet by mouth daily as needed for indigestion or heartburn.    ? clopidogrel (PLAVIX) 75 MG tablet Take 1 tablet (75 mg total) by mouth daily. 90 tablet 1  ? cyanocobalamin (,VITAMIN B-12,) 1000 MCG/ML injection Inject 1 mL (1,000 mcg total) into the muscle every 30 (thirty) days. 30 mL 2  ? D3-50 1.25 MG (50000 UT) capsule TAKE 1 CAPSULE BY MOUTH ONE TIME PER WEEK 12 capsule 3  ? furosemide (LASIX) 20 MG tablet TAKE 1 TABLET (20 MG TOTAL) BY MOUTH DAILY. TAKING EVERY DAY FOR SWELLING (Patient taking differently: Take 20 mg by mouth daily as needed for edema.) 90 tablet 2  ? latanoprost (XALATAN) 0.005 % ophthalmic solution Place 1 drop into both eyes at bedtime.    ?  loratadine (CLARITIN) 10 MG tablet Take 10 mg by mouth daily as needed for allergies.    ? Multiple Vitamins-Minerals (PRESERVISION AREDS 2) CAPS Take 1 capsule by mouth daily.    ? OVER THE COUNTER MEDICATION Apply 1 application. topically daily as needed (pain). Hempvana pain relief cream    ? pantoprazole (PROTONIX) 40 MG tablet TAKE 1 TABLET BY MOUTH TWICE A DAY 180 tablet 2  ? valsartan (DIOVAN) 160 MG tablet Take 2 tablets (320 mg total) by mouth at bedtime. (Patient taking differently: Take 160 mg by mouth at bedtime.) 90 tablet 2  ? vortioxetine HBr (TRINTELLIX) 10 MG TABS tablet Take 1 tablet (10 mg total) by mouth daily. (Patient taking differently: Take 10 mg by mouth at bedtime.) 90 tablet 1  ? ?No current facility-administered medications on file prior to visit.  ? ?Past Medical History:  ?Diagnosis Date  ? Benign paroxysmal vertigo, unspecified ear 06/07/2019  ? CKD (chronic kidney disease), stage III (HCC)   ? GERD (gastroesophageal reflux disease)   ? Glaucoma   ? ?Past Surgical History:  ?Procedure Laterality Date  ? ABDOMINAL HYSTERECTOMY    ? BACK SURGERY    ? cataract surgery    ? COLONOSCOPY N/A 08/03/2015  ? Procedure: COLONOSCOPY;  Surgeon: Carman Ching, MD;  Location: [facility] ENDOSCOPY;  Service: Endoscopy;  Laterality: N/A;  ? ESOPHAGOGASTRODUODENOSCOPY  N/A 07/31/2015  ? Procedure: ESOPHAGOGASTRODUODENOSCOPY (EGD);  Surgeon: Carman Ching, MD;  Location: [facility] ENDOSCOPY;  Service: Endoscopy;  Laterality: N/A;  ? IR ANGIO INTRA EXTRACRAN SEL COM CAROTID INNOMINATE BILAT MOD SED  06/01/2021  ? IR ANGIO VERTEBRAL SEL SUBCLAVIAN INNOMINATE UNI L MOD SED  06/01/2021  ? IR RADIOLOGIST EVAL & MGMT  04/26/2021  ? OOPHORECTOMY    ? TOTAL HIP ARTHROPLASTY    ? URETER SURGERY    ?  ?Family History  ?Problem Relation Age of Onset  ? Hypertension Mother   ? Hypertension Father   ? Heart attack Father   ? Hypertension Brother   ? Diabetes Brother   ? ?Social History  ? ?Socioeconomic History  ? Marital status: Married   ?  Spouse name: Not on file  ? Number of children: Not on file  ? Years of education: Not on file  ? Highest education level: Not on file  ?Occupational History  ? Not on file  ?Tobacco Use  ? Smoking status: Never  ? Smokeless tobacco: Never  ?Vaping Use  ? Vaping Use: Never used  ?Substance and Sexual Activity  ? Alcohol use: No  ? Drug use: No  ? Sexual activity: Not Currently  ?Other Topics Concern  ? Not on file  ?Social History Narrative  ? Not on file  ? ?Social Determinants of Health  ? ?Financial Resource Strain: Low Risk   ? Difficulty of Paying Living Expenses: Not hard at all  ?Food Insecurity: No Food Insecurity  ? Worried About Programme researcher, broadcasting/film/video in the Last Year: Never true  ? Ran Out of Food in the Last Year: Never true  ?Transportation Needs: No Transportation Needs  ? Lack of Transportation (Medical): No  ? Lack of Transportation (Non-Medical): No  ?Physical Activity: Not on file  ?Stress: Not on file  ?Social Connections: Not on file  ? ? ?Review of Systems  ?Constitutional:  Negative for chills, fatigue and fever.  ?HENT:  Negative for congestion, ear pain and sore throat.   ?Respiratory:  Negative for cough and shortness of breath.   ?Cardiovascular:  Negative for chest pain and palpitations.  ?Gastrointestinal:  Negative for abdominal pain, constipation, diarrhea, nausea and vomiting.  ?Endocrine: Negative for polydipsia, polyphagia and polyuria.  ?Genitourinary:  Negative for difficulty urinating and dysuria.  ?Musculoskeletal:  Negative for arthralgias, back pain and myalgias.  ?Skin:  Negative for rash.  ?Neurological:  Positive for weakness (right side). Negative for headaches.  ?Psychiatric/Behavioral:  Positive for confusion. Negative for dysphoric mood. The patient is not nervous/anxious.   ? ? ?Objective:  ?BP (!) 160/98 (BP Location: Right Arm)   Pulse 77   Temp 98.7 ?F (37.1 ?C)   Resp 16   Ht 5' (1.524 m)   Wt 159 lb (72.1 kg)   SpO2 99%   BMI 31.05 kg/m?  ? ?BP/Weight  06/08/2021 06/04/2021 06/01/2021  ?Systolic BP 160 153 113  ?Diastolic BP 98 88 75  ?Wt. (Lbs) 159 - 160  ?BMI 31.05 - 31.25  ? ? ?Physical Exam ?Vitals reviewed.  ?HENT:  ?   Head: Normocephalic.  ?Eyes:  ?   Extraocular Movements: Extraocular movements intact.  ?   Conjunctiva/sclera: Conjunctivae normal.  ?   Pupils: Pupils are equal, round, and reactive to light.  ?Cardiovascular:  ?   Rate and Rhythm: Normal rate and regular rhythm.  ?   Pulses: Normal pulses.  ?   Heart sounds: Normal heart sounds. No murmur  heard. ?  No gallop.  ?Pulmonary:  ?   Effort: Pulmonary effort is normal. No respiratory distress.  ?   Breath sounds: Normal breath sounds. No wheezing.  ?Abdominal:  ?   General: Abdomen is flat. Bowel sounds are normal. There is no distension.  ?   Tenderness: There is no abdominal tenderness.  ?Musculoskeletal:  ?   Cervical back: Normal range of motion.  ?   Right lower leg: No edema.  ?   Left lower leg: No edema.  ?   Comments: Has walker  ?Skin: ?   Capillary Refill: Capillary refill takes less than 2 seconds.  ?   Findings: Bruising (right groin.) present.  ?Neurological:  ?   Mental Status: She is alert and oriented to person, place, and time.  ?   Gait: Gait normal.  ?   Deep Tendon Reflexes: Reflexes normal.  ?   Comments: She is confused and changes story during vitis, she has full ROM right arm and norma .sensation, she has dysarthria on rapid alternating movements and finger counting bilaterally.  Orient.  BP each arm changing  ? ? ? ?  ? ?Lab Results  ?Component Value Date  ? WBC 5.8 06/04/2021  ? HGB 11.9 (L) 06/04/2021  ? HCT 35.7 (L) 06/04/2021  ? PLT 308 06/04/2021  ? GLUCOSE 109 (H) 06/04/2021  ? CHOL 152 03/14/2021  ? TRIG 300 (H) 03/14/2021  ? HDL 33 (L) 03/14/2021  ? LDLCALC 71 03/14/2021  ? ALT 12 06/04/2021  ? AST 15 06/04/2021  ? NA 138 06/04/2021  ? K 3.6 06/04/2021  ? CL 109 06/04/2021  ? CREATININE 0.97 06/04/2021  ? BUN 14 06/04/2021  ? CO2 21 (L) 06/04/2021  ? TSH 2.850  03/14/2021  ? INR 1.0 06/01/2021  ? HGBA1C 5.6 08/01/2015  ? ? ? ? ?Assessment & Plan:  ? ?Problem List Items Addressed This Visit   ? ?  ? Cardiovascular and Mediastinum  ? Acute embolic stroke (HCC) ?Patient is having n

## 2021-06-08 NOTE — Telephone Encounter (Signed)
Patient has an  appointment for MRI on 06/14/2021 at 3:40 pm  at [facility]. Patient and her daughter was informed. ?

## 2021-06-12 ENCOUNTER — Telehealth: Payer: Self-pay

## 2021-06-12 NOTE — Telephone Encounter (Signed)
Dr. [name] received a letter from [employer] assistance program about [name] and how she is eligible to receive medication at no cost through the program until December 31 st, 2023. ? ?Tried putting pharmacy in patient's preferred pharmacy, was unable to find pharmacy. Left message for patient to call back with more information. ?

## 2021-06-14 ENCOUNTER — Ambulatory Visit
Admission: RE | Admit: 2021-06-14 | Discharge: 2021-06-14 | Disposition: A | Discharge status: Home / Self Care | Payer: Medicare HMO | Source: Ambulatory Visit | Attending: Legal Medicine | Admitting: Legal Medicine

## 2021-06-14 DIAGNOSIS — I619 Nontraumatic intracerebral hemorrhage, unspecified: Secondary | ICD-10-CM | POA: Diagnosis not present

## 2021-06-14 MED ORDER — GADOBENATE DIMEGLUMINE 529 MG/ML IV SOLN
14.0000 mL | Freq: Once | INTRAVENOUS | Status: AC | PRN
  Administered 2021-06-14: 14 mL via INTRAVENOUS

## 2021-06-14 NOTE — Progress Notes (Signed)
MRI subacute infarction in occipital and left parietal lobes- small strokes ?lp

## 2021-06-19 ENCOUNTER — Other Ambulatory Visit: Payer: PRIVATE HEALTH INSURANCE

## 2021-06-19 ENCOUNTER — Ambulatory Visit (INDEPENDENT_AMBULATORY_CARE_PROVIDER_SITE_OTHER): Payer: PRIVATE HEALTH INSURANCE

## 2021-06-19 DIAGNOSIS — E538 Deficiency of other specified B group vitamins: Secondary | ICD-10-CM | POA: Diagnosis not present

## 2021-06-19 MED ORDER — CYANOCOBALAMIN 1000 MCG/ML IJ SOLN
1000.0000 ug | Freq: Once | INTRAMUSCULAR | Status: AC
  Administered 2021-06-19: 1000 ug via INTRAMUSCULAR

## 2021-06-21 ENCOUNTER — Telehealth: Payer: Self-pay

## 2021-06-21 ENCOUNTER — Encounter (HOSPITAL_COMMUNITY): Payer: No Typology Code available for payment source

## 2021-06-21 ENCOUNTER — Ambulatory Visit (INDEPENDENT_AMBULATORY_CARE_PROVIDER_SITE_OTHER): Payer: Medicare HMO | Admitting: *Deleted

## 2021-06-21 DIAGNOSIS — I639 Cerebral infarction, unspecified: Secondary | ICD-10-CM

## 2021-06-21 DIAGNOSIS — I771 Stricture of artery: Secondary | ICD-10-CM

## 2021-06-21 DIAGNOSIS — I618 Other nontraumatic intracerebral hemorrhage: Secondary | ICD-10-CM

## 2021-06-21 DIAGNOSIS — I6521 Occlusion and stenosis of right carotid artery: Secondary | ICD-10-CM

## 2021-06-21 DIAGNOSIS — I350 Nonrheumatic aortic (valve) stenosis: Secondary | ICD-10-CM

## 2021-06-21 NOTE — Patient Instructions (Signed)
Visit Information ? ?Thank you for taking time to visit with me today. Please don't hesitate to contact me if I can be of assistance to you before our next scheduled telephone appointment. ? ?Following are the goals we discussed today:  ?(Copy and paste patient goals from clinical care plan here) ? ?Our next appointment is by telephone on 06/28/21 at 9:45 ? ?Please call the care guide team at [phone] if you need to cancel or reschedule your appointment.  ? ?If you are experiencing a Mental Health or Behavioral Health Crisis or need someone to talk to, please call 1-800-273-TALK (toll free, 24 hour hotline) ?call 911  ? ?The patient verbalized understanding of instructions, educational materials, and care plan provided today and declined offer to receive copy of patient instructions, educational materials, and care plan.  ? ?Reece Levy MSW, LCSW ?Licensed Clinical Social Worker ?[facility]   ?[phone]  ?

## 2021-06-21 NOTE — Chronic Care Management (AMB) (Signed)
?Chronic Care Management  ? ? Clinical Social Work Note ? ?06/21/2021 ?Name: [name] MRN: [id number] DOB: [birth year] ? ?[name] is a 81 y.o. year old female who is a primary care patient of [name], MD. The CCM team was consulted to assist the patient with chronic disease management and/or care coordination needs related to: Transportation Needs , [employer] , Level of Care Concerns, and Caregiver Stress.  ? ?Engaged with patient by telephone for initial visit in response to provider referral for social work chronic care management and care coordination services.  ? ?Consent to Services:  ?The patient was given information about Chronic Care Management services, agreed to services, and gave verbal consent prior to initiation of services.  Please see initial visit note for detailed documentation.  ? ?Patient agreed to services and consent obtained.  ? ?Assessment: Review of patient past medical history, allergies, medications, and health status, including review of relevant consultants reports was performed today as part of a comprehensive evaluation and provision of chronic care management and care coordination services.    ? ?SDOH (Social Determinants of Health) assessments and interventions performed:   ? ?Advanced Directives Status: See Care Plan for related entries. ? ?CCM Care Plan ? ?Allergies  ?Allergen Reactions  ? Codeine Nausea And Vomiting  ? Gabapentin   ?  Dizziness, couldn't function   ? Meclizine Other (See Comments)  ?  Lethargy, extreme sleepiness  ? Requip [Ropinirole Hcl] Nausea And Vomiting  ? Statins   ?  Muscle weakness  ? ? ?Outpatient Encounter Medications as of 06/21/2021  ?Medication Sig Note  ? Acetaminophen (TYLENOL) 325 MG CAPS Take 650 mg by mouth every 6 (six) hours as needed (pain).   ? Alirocumab (PRALUENT) 75 MG/ML SOAJ Inject 1 pen into the skin every 14 (fourteen) days.   ? amLODipine (NORVASC) 10 MG tablet Take 1 tablet (10 mg total) by mouth  daily. (Patient taking differently: Take 10 mg by mouth at bedtime.)   ? ARTIFICIAL TEAR SOLUTION OP Place 1 drop into both eyes daily as needed (dry eyes).   ? calcium carbonate (TUMS - DOSED IN MG ELEMENTAL CALCIUM) 500 MG chewable tablet Chew 1 tablet by mouth daily as needed for indigestion or heartburn.   ? clopidogrel (PLAVIX) 75 MG tablet Take 1 tablet (75 mg total) by mouth daily.   ? cyanocobalamin (,VITAMIN B-12,) 1000 MCG/ML injection Inject 1 mL (1,000 mcg total) into the muscle every 30 (thirty) days.   ? D3-50 1.25 MG (50000 UT) capsule TAKE 1 CAPSULE BY MOUTH ONE TIME PER WEEK 05/30/2021: Daughter confirmed that she has this medicine but doesn't think the pt is taking it regularly, daughter also states she doesn't think the pt knows if she takes it or not  ? furosemide (LASIX) 20 MG tablet TAKE 1 TABLET (20 MG TOTAL) BY MOUTH DAILY. TAKING EVERY DAY FOR SWELLING (Patient taking differently: Take 20 mg by mouth daily as needed for edema.)   ? latanoprost (XALATAN) 0.005 % ophthalmic solution Place 1 drop into both eyes at bedtime.   ? loratadine (CLARITIN) 10 MG tablet Take 10 mg by mouth daily as needed for allergies.   ? Multiple Vitamins-Minerals (PRESERVISION AREDS 2) CAPS Take 1 capsule by mouth daily.   ? OVER THE COUNTER MEDICATION Apply 1 application. topically daily as needed (pain). Hempvana pain relief cream   ? pantoprazole (PROTONIX) 40 MG tablet TAKE 1 TABLET BY MOUTH TWICE A DAY   ? valsartan (  DIOVAN) 160 MG tablet Take 2 tablets (320 mg total) by mouth at bedtime. (Patient taking differently: Take 160 mg by mouth at bedtime.)   ? vortioxetine HBr (TRINTELLIX) 10 MG TABS tablet Take 1 tablet (10 mg total) by mouth daily. (Patient taking differently: Take 10 mg by mouth at bedtime.)   ? ?No facility-administered encounter medications on file as of 06/21/2021.  ? ? ?Patient Active Problem List  ? Diagnosis Date Noted  ? Arterial stenosis (HCC) 05/07/2021  ? Carotid stenosis, non-symptomatic,  right 05/07/2021  ? Incisional infection 12/25/2020  ? Visit for suture removal 12/25/2020  ? History of stroke with residual effects 05/15/2020  ? Heat exhaustion 11/04/2019  ? CKD (chronic kidney disease), stage III (HCC) 10/28/2019  ? Upper GI bleed 10/28/2019  ? Hiatal hernia 10/28/2019  ? Essential hypertension 10/28/2019  ? Depression, major, single episode, severe (HCC) 10/14/2019  ? BMI 32.0-32.9,adult 10/01/2019  ? PVD (peripheral vascular disease) (HCC) 10/01/2019  ? Pain in right knee 10/01/2019  ? Benign paroxysmal vertigo, unspecified ear 06/07/2019  ? Primary insomnia 05/25/2019  ? 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 Acute embolic stroke (HCC)   ? Stroke (cerebrum) (HCC) 06/10/2015  ? Mixed hyperlipidemia 02/13/2015  ? Mild aortic stenosis 08/13/2013  ? LBBB (left bundle branch block) 07/12/2011  ? Carotid bruit 07/12/2011  ? Chest pain 07/12/2011  ? Murmur 07/12/2011  ? ? ?Conditions to be addressed/monitored:  history of CVA's ; Limited social support, Transportation, Level of care concerns, ADL IADL limitations, Social Isolation, Limited access to caregiver, and Lacks knowledge of community resource:   ? ?Care Plan : LCSW Plan of Care  ?Updates made by [name], LCSW since 06/21/2021 12:00 AM  ?  ? ?Problem: Functional Decline/Caregiver Stress   ?Priority: High  ?  ? ?Long-Range Goal: Provide resources, support and guidance to improve QOL and care needs   ?Start Date: 06/21/2021  ?Expected End Date: 10/21/2021  ?This Visit's Progress: On track  ?Priority: High  ?Note:   ?Current Barriers:  ?Limited social support, Transportation, Level of care concerns, ADL IADL limitations, Limited access to caregiver, and Lacks knowledge of community resource:    ?Transportation and Social Connections ?Level of Care Concerns:care needs for pt and spouse ? ?CSW Clinical Goal(s):  ?Caregiver  will explore community resource options for unmet needs related to:  Transportation, Social  Connections, Stress, and Physical Activity ?patient will work with SW to address concerns related to care needs, caregiver stress, etc  through collaboration with Visual merchandiser, provider, and care team.  ? ?Interventions: ? ?CSW spoke with pt's daughter/caregiver, [name], who reports a lot of caregiver stress. Daughter is going back and forth daily to aide in care of pt. Pt lives with her husband of 23 years (he will soon be 27).  ?Daughter is feeling overwhelmed with caregiving; also dealing with her own loss of spouse last year. "I have not had time to grieve". CSW validated all of her emotions and encouraged participation in grief counseling as well as looking at ways to minimize her caregiving responsibilities.  Daughter asking about getting [facility] out to work with pt- CSW will ask PCP to place referral for [facility], PT, OT evals. CSW talked with daughter about hired caregiver support and she will check pt's 3 insurance policies to see if they cover this. Otherwise, they may consider private pay option as discussed.  ?1:1 collaboration with primary care provider regarding development and update of comprehensive plan  of care as evidenced by provider attestation and co-signature ?Inter-disciplinary care team collaboration (see longitudinal plan of care) ?Evaluation of current treatment plan related to  self management and patient's adherence to plan as established by provider ?Review resources, discussed options and provided patient information about  ?Transportation provided by insurance provider ( ) ?Enhanced Benefits connected with insurance provider:( ) ?Caregiver support ?Referral to care guide (meals on wheels, MOM's Meals, life alert, caregiver agency list )  ?Private pay options for personal care needs :( ) ?Meals on wheels () ? ? ?Social Determinants of Health in Patient with  CVA's, care needs :  (Status: New goal.) ?SDOH assessments completed: Social Connections and Physical Activity ? ?Level of  Care Concerns in a patient with  history of CVA's :  (Status: New goal.) ?Current level of care: home with other family or significant other(s): spouse and family member: daughter (comes to help and bring meals)

## 2021-06-21 NOTE — Telephone Encounter (Signed)
? ?  Telephone encounter was:  Unsuccessful.  06/21/2021 ?Name: [name] MRN: [id number] DOB: Jan 20, 1941 ? ?Unsuccessful outbound call made today to assist with:  Transportation Needs , Food Insecurity, and Caregiver Stress ? ?Outreach Attempt:  1st Attempt ? ?A HIPAA compliant voice message was left requesting a return call.  Instructed patient to call back at earliest convenience. ? ?. ? ? ?Lenard Forth ?Care Guide, Embedded Care Coordination ?[place], Care Management  ?[phone] ?[address] [place], [place], [place] [address] ?Phone: [phone] ?Email: [email or web address] ? ?  ?

## 2021-06-21 NOTE — Telephone Encounter (Signed)
PLEASE DISREGARD

## 2021-06-22 DIAGNOSIS — I639 Cerebral infarction, unspecified: Secondary | ICD-10-CM

## 2021-06-22 DIAGNOSIS — I618 Other nontraumatic intracerebral hemorrhage: Secondary | ICD-10-CM

## 2021-06-25 ENCOUNTER — Telehealth: Payer: Self-pay

## 2021-06-25 NOTE — Telephone Encounter (Signed)
? ?  Telephone encounter was:  Successful.  ?06/25/2021 ?Name: [name] MRN: [id number] DOB: 31-Mar-1940 ? ?[name] is a 81 y.o. year old female who is a primary care patient of [name], MD . The community resource team was consulted for assistance with Caregiver Stress ? ?Care guide performed the following interventions: Patient provided with information about care guide support team and interviewed to confirm resource needs. ? ?Follow Up Plan:  Care guide will follow up with patient by phone over the next week ? ? ? ? ?Lenard Forth ?Care Guide, Embedded Care Coordination ?[place], Care Management  ?[phone] ?[address] [place], [place], [place] [address] ?Phone: [phone] ?Email: [email or web address] ? ?   ?

## 2021-06-25 NOTE — Telephone Encounter (Signed)
? ?  Telephone encounter was:  Successful.  ?06/25/2021 ?Name: Ivana F Weidler MRN: [id number] DOB: 01/14/1941 ? ?Talayia F Gorley is a 80 y.o. year old female who is a primary care patient of Perry, Lawrence Edward, MD . The community resource team was consulted for assistance with Caregiver Stress ? ?Care guide performed the following interventions: Patient provided with information about care guide support team and interviewed to confirm resource needs. ? ?Follow Up Plan:  Care guide will follow up with patient by phone over the next week ? ? ? ? ?Yesena Reaves ?Care Guide, Embedded Care Coordination ?[place], Care Management  ?[phone] ?[address], [place], [place] [address] ?Phone: [phone] ?Email: [email or web address] ? ?   ?

## 2021-06-27 ENCOUNTER — Ambulatory Visit (HOSPITAL_COMMUNITY): Payer: Medicare HMO

## 2021-06-27 ENCOUNTER — Telehealth: Payer: Self-pay

## 2021-06-27 NOTE — Telephone Encounter (Signed)
error 

## 2021-06-28 ENCOUNTER — Telehealth: Payer: Self-pay

## 2021-06-28 ENCOUNTER — Ambulatory Visit (INDEPENDENT_AMBULATORY_CARE_PROVIDER_SITE_OTHER): Payer: Medicare HMO | Admitting: *Deleted

## 2021-06-28 ENCOUNTER — Telehealth: Payer: Self-pay | Admitting: *Deleted

## 2021-06-28 DIAGNOSIS — N1831 Chronic kidney disease, stage 3a: Secondary | ICD-10-CM

## 2021-06-28 DIAGNOSIS — I639 Cerebral infarction, unspecified: Secondary | ICD-10-CM

## 2021-06-28 NOTE — Progress Notes (Signed)
? ? ?Chronic Care Management ?Pharmacy Assistant  ? ?Name: [name]  MRN: [id number] DOB: 1940-12-11 ? ? ?Reason for Encounter: Medication Coordination for [facility]  ?  ?Recent office visits:  ?06/19/21 [name] LPN. Seen for B12 injection. No med changes. ? ?06/08/21 [name] MD. Seen for acute postprocedural pain. Referral to Home Health. No med changes.  ? ?Recent consult visits:  ?None ? ?Hospital visits:  ?Medication Reconciliation was completed by comparing discharge summary, patient?s EMR and Pharmacy list, and upon discussion with patient. ? ?Admitted to the hospital on 06/04/21 due to Postprocedural pain. Discharge date was 06/04/21. Discharged from [facility].   ? ?Medications remain the same after Hospital Discharge:??  ? ?Medications: ?Outpatient Encounter Medications as of 06/28/2021  ?Medication Sig Note  ? Acetaminophen (TYLENOL) 325 MG CAPS Take 650 mg by mouth every 6 (six) hours as needed (pain).   ? Alirocumab (PRALUENT) 75 MG/ML SOAJ Inject 1 pen into the skin every 14 (fourteen) days.   ? amLODipine (NORVASC) 10 MG tablet Take 1 tablet (10 mg total) by mouth daily. (Patient taking differently: Take 10 mg by mouth at bedtime.)   ? ARTIFICIAL TEAR SOLUTION OP Place 1 drop into both eyes daily as needed (dry eyes).   ? calcium carbonate (TUMS - DOSED IN MG ELEMENTAL CALCIUM) 500 MG chewable tablet Chew 1 tablet by mouth daily as needed for indigestion or heartburn.   ? clopidogrel (PLAVIX) 75 MG tablet Take 1 tablet (75 mg total) by mouth daily.   ? cyanocobalamin (,VITAMIN B-12,) 1000 MCG/ML injection Inject 1 mL (1,000 mcg total) into the muscle every 30 (thirty) days.   ? D3-50 1.25 MG (50000 UT) capsule TAKE 1 CAPSULE BY MOUTH ONE TIME PER WEEK 05/30/2021: Daughter confirmed that she has this medicine but doesn't think the pt is taking it regularly, daughter also states she doesn't think the pt knows if she takes it or not  ? furosemide (LASIX) 20 MG tablet  TAKE 1 TABLET (20 MG TOTAL) BY MOUTH DAILY. TAKING EVERY DAY FOR SWELLING (Patient taking differently: Take 20 mg by mouth daily as needed for edema.)   ? latanoprost (XALATAN) 0.005 % ophthalmic solution Place 1 drop into both eyes at bedtime.   ? loratadine (CLARITIN) 10 MG tablet Take 10 mg by mouth daily as needed for allergies.   ? Multiple Vitamins-Minerals (PRESERVISION AREDS 2) CAPS Take 1 capsule by mouth daily.   ? OVER THE COUNTER MEDICATION Apply 1 application. topically daily as needed (pain). Hempvana pain relief cream   ? pantoprazole (PROTONIX) 40 MG tablet TAKE 1 TABLET BY MOUTH TWICE A DAY   ? valsartan (DIOVAN) 160 MG tablet Take 2 tablets (320 mg total) by mouth at bedtime. (Patient taking differently: Take 160 mg by mouth at bedtime.)   ? vortioxetine HBr (TRINTELLIX) 10 MG TABS tablet Take 1 tablet (10 mg total) by mouth daily. (Patient taking differently: Take 10 mg by mouth at bedtime.)   ? ?No facility-administered encounter medications on file as of 06/28/2021.  ? ? ?Reviewed chart for medication changes ahead of medication coordination call. ? ?No Consult visits since last care coordination call/Pharmacist visit.  ? ?No medication changes indicated OR if recent visit, treatment plan here. ? ?BP Readings from Last 3 Encounters:  ?06/08/21 (!) 160/98  ?06/04/21 (!) 153/88  ?06/01/21 113/75  ?  ?Lab Results  ?Component Value Date  ? HGBA1C 5.6 08/01/2015  ?  ? ?Patient obtains medications through  Adherence Packaging  30 Days  ? ?Last adherence delivery included:  ?Amlodipine 10 mg at bedtime ?Vitamin D3 5000 units at bedtime on Tuesdays ?Plavix 75 mg at bedtime ?Valsartan 160 mg twice at bedtime ?Trintellix 10 mg at bedtime ?Latanoprost 0.01% 1 drop in each eye at bedtime ?Lasix 20 mg prn in vials ? ?Patient declined (meds) last month  ?None ? ?Patient is due for next adherence delivery on: 07/10/21. ?Called patient and reviewed medications and coordinated delivery. ? ?This delivery to  include: ?Valsartan 160mg  2 BT ?Trintellix 10mg  1 BT ?Furosemide 20mg - 1 tab daily ([place]) ?Amlodipine 10mg  1 BT ?Clopidogrel 75mg  1 BT ?Vitamin D3 50,000units 1 BT on Tuesdays ? ?Patient declined the following medications  ?latanoprost cannot be filled until 4/24 ?Praluent 75mg /ml- Picked up at [facility] on 05/23/21 84 ds. Daughter wants to just keep it as [facility] ?Pantoprazole 40mg - Pt is no longer taking ?B12 Injection- Gets injections at the office  ? ?Patient needs refills  ?None ? ?Confirmed delivery date of 07/10/21, advised patient that pharmacy will contact them the morning of delivery. ? ? ? , CMA ?Clinical Pharmacist Assistant  ?[phone]  ?

## 2021-06-29 DIAGNOSIS — N183 Chronic kidney disease, stage 3 unspecified: Secondary | ICD-10-CM | POA: Diagnosis not present

## 2021-06-29 DIAGNOSIS — I69393 Ataxia following cerebral infarction: Secondary | ICD-10-CM | POA: Diagnosis not present

## 2021-06-29 DIAGNOSIS — I6521 Occlusion and stenosis of right carotid artery: Secondary | ICD-10-CM | POA: Diagnosis not present

## 2021-06-29 DIAGNOSIS — I350 Nonrheumatic aortic (valve) stenosis: Secondary | ICD-10-CM | POA: Diagnosis not present

## 2021-06-29 DIAGNOSIS — K219 Gastro-esophageal reflux disease without esophagitis: Secondary | ICD-10-CM | POA: Diagnosis not present

## 2021-06-29 DIAGNOSIS — K449 Diaphragmatic hernia without obstruction or gangrene: Secondary | ICD-10-CM | POA: Diagnosis not present

## 2021-06-29 DIAGNOSIS — E782 Mixed hyperlipidemia: Secondary | ICD-10-CM | POA: Diagnosis not present

## 2021-06-29 DIAGNOSIS — H409 Unspecified glaucoma: Secondary | ICD-10-CM | POA: Diagnosis not present

## 2021-06-29 DIAGNOSIS — I69322 Dysarthria following cerebral infarction: Secondary | ICD-10-CM | POA: Diagnosis not present

## 2021-06-29 DIAGNOSIS — I447 Left bundle-branch block, unspecified: Secondary | ICD-10-CM | POA: Diagnosis not present

## 2021-06-29 DIAGNOSIS — F5101 Primary insomnia: Secondary | ICD-10-CM | POA: Diagnosis not present

## 2021-06-29 DIAGNOSIS — H8110 Benign paroxysmal vertigo, unspecified ear: Secondary | ICD-10-CM | POA: Diagnosis not present

## 2021-06-29 DIAGNOSIS — I739 Peripheral vascular disease, unspecified: Secondary | ICD-10-CM | POA: Diagnosis not present

## 2021-06-29 DIAGNOSIS — Z7902 Long term (current) use of antithrombotics/antiplatelets: Secondary | ICD-10-CM | POA: Diagnosis not present

## 2021-06-29 DIAGNOSIS — I129 Hypertensive chronic kidney disease with stage 1 through stage 4 chronic kidney disease, or unspecified chronic kidney disease: Secondary | ICD-10-CM | POA: Diagnosis not present

## 2021-06-29 DIAGNOSIS — F323 Major depressive disorder, single episode, severe with psychotic features: Secondary | ICD-10-CM | POA: Diagnosis not present

## 2021-06-29 DIAGNOSIS — I2510 Atherosclerotic heart disease of native coronary artery without angina pectoris: Secondary | ICD-10-CM | POA: Diagnosis not present

## 2021-06-29 NOTE — Patient Instructions (Signed)
Visit Information ? ?Thank you for taking time to visit with me today. Please don't hesitate to contact me if I can be of assistance to you before our next scheduled telephone appointment. ? ?Our next appointment is by telephone  ?Please call the care guide team at [phone] if you need to cancel or reschedule your appointment.  ? ?If you are experiencing a Mental Health or Behavioral Health Crisis or need someone to talk to, please call the [facility]: [phone]  ? ?The patient verbalized understanding of instructions, educational materials, and care plan provided today and declined offer to receive copy of patient instructions, educational materials, and care plan.  ? ?Reece Levy MSW, LCSW ?Licensed Clinical Social Worker ?[facility]   ?[phone]   ?

## 2021-06-29 NOTE — Telephone Encounter (Signed)
Compliant on meds 

## 2021-06-29 NOTE — Chronic Care Management (AMB) (Signed)
?Chronic Care Management  ? ? Clinical Social Work Note ? ?06/29/2021 ?Name: [name] MRN: [id number] DOB: 11/23/1940 ? ?[name] is a 81 y.o. year old female who is a primary care patient of Diane Miyamoto, MD. The CCM team was consulted to assist the patient with chronic disease management and/or care coordination needs related to: [employer] , Level of Care Concerns, and Caregiver Stress.  ? ?Engaged with patient by telephone for follow up visit in response to provider referral for social work chronic care management and care coordination services.  ? ?Consent to Services:  ?The patient was given information about Chronic Care Management services, agreed to services, and gave verbal consent prior to initiation of services.  Please see initial visit note for detailed documentation.  ? ?Patient agreed to services and consent obtained.  ? ?Assessment: Review of patient past medical history, allergies, medications, and health status, including review of relevant consultants reports was performed today as part of a comprehensive evaluation and provision of chronic care management and care coordination services.    ? ?SDOH (Social Determinants of Health) assessments and interventions performed:   ? ?Advanced Directives Status: Not addressed in this encounter. ? ?CCM Care Plan ? ?Allergies  ?Allergen Reactions  ? Codeine Nausea And Vomiting  ? Gabapentin   ?  Dizziness, couldn't function   ? Meclizine Other (See Comments)  ?  Lethargy, extreme sleepiness  ? Requip [Ropinirole Hcl] Nausea And Vomiting  ? Statins   ?  Muscle weakness  ? ? ?Outpatient Encounter Medications as of 06/28/2021  ?Medication Sig Note  ? Acetaminophen (TYLENOL) 325 MG CAPS Take 650 mg by mouth every 6 (six) hours as needed (pain).   ? Alirocumab (PRALUENT) 75 MG/ML SOAJ Inject 1 pen into the skin every 14 (fourteen) days.   ? amLODipine (NORVASC) 10 MG tablet Take 1 tablet (10 mg total) by mouth daily. (Patient taking  differently: Take 10 mg by mouth at bedtime.)   ? ARTIFICIAL TEAR SOLUTION OP Place 1 drop into both eyes daily as needed (dry eyes).   ? calcium carbonate (TUMS - DOSED IN MG ELEMENTAL CALCIUM) 500 MG chewable tablet Chew 1 tablet by mouth daily as needed for indigestion or heartburn.   ? clopidogrel (PLAVIX) 75 MG tablet Take 1 tablet (75 mg total) by mouth daily.   ? cyanocobalamin (,VITAMIN B-12,) 1000 MCG/ML injection Inject 1 mL (1,000 mcg total) into the muscle every 30 (thirty) days.   ? D3-50 1.25 MG (50000 UT) capsule TAKE 1 CAPSULE BY MOUTH ONE TIME PER WEEK 05/30/2021: Daughter confirmed that she has this medicine but doesn't think the pt is taking it regularly, daughter also states she doesn't think the pt knows if she takes it or not  ? furosemide (LASIX) 20 MG tablet TAKE 1 TABLET (20 MG TOTAL) BY MOUTH DAILY. TAKING EVERY DAY FOR SWELLING (Patient taking differently: Take 20 mg by mouth daily as needed for edema.)   ? latanoprost (XALATAN) 0.005 % ophthalmic solution Place 1 drop into both eyes at bedtime.   ? loratadine (CLARITIN) 10 MG tablet Take 10 mg by mouth daily as needed for allergies.   ? Multiple Vitamins-Minerals (PRESERVISION AREDS 2) CAPS Take 1 capsule by mouth daily.   ? OVER THE COUNTER MEDICATION Apply 1 application. topically daily as needed (pain). Hempvana pain relief cream   ? pantoprazole (PROTONIX) 40 MG tablet TAKE 1 TABLET BY MOUTH TWICE A DAY   ? valsartan (DIOVAN) 160 MG  tablet Take 2 tablets (320 mg total) by mouth at bedtime. (Patient taking differently: Take 160 mg by mouth at bedtime.)   ? vortioxetine HBr (TRINTELLIX) 10 MG TABS tablet Take 1 tablet (10 mg total) by mouth daily. (Patient taking differently: Take 10 mg by mouth at bedtime.)   ? ?No facility-administered encounter medications on file as of 06/28/2021.  ? ? ?Patient Active Problem List  ? Diagnosis Date Noted  ? Arterial stenosis (HCC) 05/07/2021  ? Carotid stenosis, non-symptomatic, right 05/07/2021  ?  Incisional infection 12/25/2020  ? Visit for suture removal 12/25/2020  ? History of stroke with residual effects 05/15/2020  ? Heat exhaustion 11/04/2019  ? CKD (chronic kidney disease), stage III (HCC) 10/28/2019  ? Upper GI bleed 10/28/2019  ? Hiatal hernia 10/28/2019  ? Essential hypertension 10/28/2019  ? Depression, major, single episode, severe (HCC) 10/14/2019  ? BMI 32.0-32.9,adult 10/01/2019  ? PVD (peripheral vascular disease) (HCC) 10/01/2019  ? Pain in right knee 10/01/2019  ? Benign paroxysmal vertigo, unspecified ear 06/07/2019  ? Primary insomnia 05/25/2019  ? 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 Acute embolic stroke (HCC)   ? Stroke (cerebrum) (HCC) 06/10/2015  ? Mixed hyperlipidemia 02/13/2015  ? Mild aortic stenosis 08/13/2013  ? LBBB (left bundle branch block) 07/12/2011  ? Carotid bruit 07/12/2011  ? Chest pain 07/12/2011  ? Murmur 07/12/2011  ? ? ?Conditions to be addressed/monitored:  caregiver stress ; Limited access to caregiver ? ?Care Plan : LCSW Plan of Care  ?Updates made by [name], LCSW since 06/29/2021 12:00 AM  ?  ? ?Problem: Functional Decline/Caregiver Stress   ?Priority: High  ?  ? ?Long-Range Goal: Provide resources, support and guidance to improve QOL and care needs   ?Start Date: 06/21/2021  ?Expected End Date: 10/21/2021  ?This Visit's Progress: On track  ?Recent Progress: On track  ?Priority: High  ?Note:   ?Current Barriers:  ?Limited social support, Transportation, Level of care concerns, ADL IADL limitations, Limited access to caregiver, and Lacks knowledge of community resource:    ?Transportation and Social Connections ?Level of Care Concerns:care needs for pt and spouse ? ?CSW Clinical Goal(s):  ?Caregiver  will explore community resource options for unmet needs related to:  Transportation, Social Connections, Stress, and Physical Activity ?patient will work with SW to address concerns related to care needs, caregiver stress, etc  through  collaboration with Visual merchandiser, provider, and care team.  ? ?Interventions: ? ?CSW spoke with pt's daughter/caregiver, [name], who reports she has been dealing with some significant pain herself- received a shot and is hoping for relief. She also shared that pt has fallen a few times at home recently- they are expecting [facility] to come out 06/29/21 to assess and begin [facility] services.  Daughter is concerned that pt could have had a stroke- suggested she communicate this and the falls to PCP. Support offered-  ? ? [name] Daughter is going back and forth daily to aide in care of pt. Pt lives with her husband of 23 years (he will soon be 80).  ?Daughter is feeling overwhelmed with caregiving; also dealing with her own loss of spouse last year. "I have not had time to grieve". CSW validated all of her emotions and encouraged participation in grief counseling as well as looking at ways to minimize her caregiving responsibilities.  Daughter asking about getting [facility] out to work with pt- CSW will ask PCP to place referral for [facility], PT,  OT evals. CSW talked with daughter about hired caregiver support and she will check pt's 3 insurance policies to see if they cover this. Otherwise, they may consider private pay option as discussed.  ?1:1 collaboration with primary care provider regarding development and update of comprehensive plan of care as evidenced by provider attestation and co-signature ?Inter-disciplinary care team collaboration (see longitudinal plan of care) ?Evaluation of current treatment plan related to  self management and patient's adherence to plan as established by provider ?Review resources, discussed options and provided patient information about  ?Transportation provided by insurance provider ( ) ?Enhanced Benefits connected with insurance provider:( ) ?Caregiver support ?Referral to care guide (meals on wheels, MOM's Meals, life alert, caregiver agency list )  ?Private pay options for  personal care needs :( ) ?Meals on wheels () ? ? ?Social Determinants of Health in Patient with  CVA's, care needs :  (Status: New goal.) ?SDOH assessments completed: Social Connections and Physical Activity ? ?Level

## 2021-07-04 ENCOUNTER — Emergency Department (HOSPITAL_COMMUNITY): Payer: Medicare HMO

## 2021-07-04 ENCOUNTER — Observation Stay (HOSPITAL_COMMUNITY)
Admission: EM | Admit: 2021-07-04 | Discharge: 2021-07-06 | Disposition: A | Discharge status: Home Health | Payer: Medicare HMO | Attending: Internal Medicine | Admitting: Internal Medicine

## 2021-07-04 DIAGNOSIS — I63412 Cerebral infarction due to embolism of left middle cerebral artery: Principal | ICD-10-CM | POA: Insufficient documentation

## 2021-07-04 DIAGNOSIS — I63233 Cerebral infarction due to unspecified occlusion or stenosis of bilateral carotid arteries: Secondary | ICD-10-CM | POA: Diagnosis not present

## 2021-07-04 DIAGNOSIS — R300 Dysuria: Secondary | ICD-10-CM | POA: Diagnosis not present

## 2021-07-04 DIAGNOSIS — E782 Mixed hyperlipidemia: Secondary | ICD-10-CM | POA: Diagnosis present

## 2021-07-04 DIAGNOSIS — R41841 Cognitive communication deficit: Secondary | ICD-10-CM | POA: Insufficient documentation

## 2021-07-04 DIAGNOSIS — G319 Degenerative disease of nervous system, unspecified: Secondary | ICD-10-CM | POA: Diagnosis not present

## 2021-07-04 DIAGNOSIS — Z79899 Other long term (current) drug therapy: Secondary | ICD-10-CM | POA: Insufficient documentation

## 2021-07-04 DIAGNOSIS — I447 Left bundle-branch block, unspecified: Secondary | ICD-10-CM | POA: Diagnosis not present

## 2021-07-04 DIAGNOSIS — I213 ST elevation (STEMI) myocardial infarction of unspecified site: Secondary | ICD-10-CM | POA: Diagnosis not present

## 2021-07-04 DIAGNOSIS — M6281 Muscle weakness (generalized): Secondary | ICD-10-CM | POA: Diagnosis present

## 2021-07-04 DIAGNOSIS — R9431 Abnormal electrocardiogram [ECG] [EKG]: Secondary | ICD-10-CM | POA: Diagnosis not present

## 2021-07-04 DIAGNOSIS — K2970 Gastritis, unspecified, without bleeding: Secondary | ICD-10-CM | POA: Diagnosis not present

## 2021-07-04 DIAGNOSIS — R4781 Slurred speech: Secondary | ICD-10-CM | POA: Diagnosis not present

## 2021-07-04 DIAGNOSIS — R69 Illness, unspecified: Secondary | ICD-10-CM | POA: Diagnosis not present

## 2021-07-04 DIAGNOSIS — K922 Gastrointestinal hemorrhage, unspecified: Secondary | ICD-10-CM | POA: Diagnosis present

## 2021-07-04 DIAGNOSIS — I10 Essential (primary) hypertension: Secondary | ICD-10-CM | POA: Diagnosis not present

## 2021-07-04 DIAGNOSIS — I639 Cerebral infarction, unspecified: Secondary | ICD-10-CM | POA: Diagnosis not present

## 2021-07-04 DIAGNOSIS — F322 Major depressive disorder, single episode, severe without psychotic features: Secondary | ICD-10-CM | POA: Insufficient documentation

## 2021-07-04 DIAGNOSIS — N1831 Chronic kidney disease, stage 3a: Secondary | ICD-10-CM | POA: Diagnosis not present

## 2021-07-04 DIAGNOSIS — I63212 Cerebral infarction due to unspecified occlusion or stenosis of left vertebral arteries: Secondary | ICD-10-CM | POA: Diagnosis not present

## 2021-07-04 DIAGNOSIS — K2100 Gastro-esophageal reflux disease with esophagitis, without bleeding: Secondary | ICD-10-CM | POA: Diagnosis not present

## 2021-07-04 DIAGNOSIS — Z743 Need for continuous supervision: Secondary | ICD-10-CM | POA: Diagnosis not present

## 2021-07-04 DIAGNOSIS — K209 Esophagitis, unspecified without bleeding: Secondary | ICD-10-CM | POA: Diagnosis present

## 2021-07-04 DIAGNOSIS — I129 Hypertensive chronic kidney disease with stage 1 through stage 4 chronic kidney disease, or unspecified chronic kidney disease: Secondary | ICD-10-CM | POA: Insufficient documentation

## 2021-07-04 LAB — CBC WITH DIFFERENTIAL/PLATELET
Abs Immature Granulocytes: 0.01 10*3/uL (ref 0.00–0.07)
Basophils Absolute: 0 10*3/uL (ref 0.0–0.1)
Basophils Relative: 1 %
Eosinophils Absolute: 0.3 10*3/uL (ref 0.0–0.5)
Eosinophils Relative: 5 %
HCT: 38.6 % (ref 36.0–46.0)
Hemoglobin: 12.2 g/dL (ref 12.0–15.0)
Immature Granulocytes: 0 %
Lymphocytes Relative: 33 %
Lymphs Abs: 1.9 10*3/uL (ref 0.7–4.0)
MCH: 30.6 pg (ref 26.0–34.0)
MCHC: 31.6 g/dL (ref 30.0–36.0)
MCV: 96.7 fL (ref 80.0–100.0)
Monocytes Absolute: 0.8 10*3/uL (ref 0.1–1.0)
Monocytes Relative: 14 %
Neutro Abs: 2.8 10*3/uL (ref 1.7–7.7)
Neutrophils Relative %: 47 %
Platelets: 278 10*3/uL (ref 150–400)
RBC: 3.99 MIL/uL (ref 3.87–5.11)
RDW: 12.6 % (ref 11.5–15.5)
WBC: 5.7 10*3/uL (ref 4.0–10.5)
nRBC: 0 % (ref 0.0–0.2)

## 2021-07-04 LAB — BASIC METABOLIC PANEL
Anion gap: 9 (ref 5–15)
BUN: 19 mg/dL (ref 8–23)
CO2: 20 mmol/L — ABNORMAL LOW (ref 22–32)
Calcium: 10.1 mg/dL (ref 8.9–10.3)
Chloride: 111 mmol/L (ref 98–111)
Creatinine, Ser: 0.98 mg/dL (ref 0.44–1.00)
GFR, Estimated: 58 mL/min — ABNORMAL LOW (ref >60)
Glucose, Bld: 99 mg/dL (ref 70–99)
Potassium: 3.5 mmol/L (ref 3.5–5.1)
Sodium: 140 mmol/L (ref 135–145)

## 2021-07-04 MED ORDER — ACETAMINOPHEN 325 MG PO TABS
650.0000 mg | ORAL_TABLET | Freq: Once | ORAL | Status: AC
  Administered 2021-07-04: 650 mg via ORAL
  Filled 2021-07-04: qty 2

## 2021-07-04 MED ORDER — GADOBUTROL 1 MMOL/ML IV SOLN
7.0000 mL | Freq: Once | INTRAVENOUS | Status: AC | PRN
  Administered 2021-07-04: 7 mL via INTRAVENOUS

## 2021-07-04 NOTE — ED Triage Notes (Signed)
Per [facility] pt is at baseline for neuro status w/ baseline slurred speech and eye droop. EMS endorses EKG changes en route.  ?

## 2021-07-04 NOTE — ED Provider Notes (Signed)
?[facility] ?Provider Note ? ? ?CSN: [id number] ?Arrival date & time: 07/04/21  2025 ? ?  ? ?History ? ?Chief Complaint  ?Patient presents with  ? Neurologic Problem  ? ? ?[name] is a 81 y.o. female. ? ?Patient here with may be increased slurred speech, right eye droop.  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  Recently had angiogram of the brain that showed multiple aneurysms.  She has had recent stroke that has had some right-sided weakness.  Not sure if there are any new symptoms currently but thought slurred speech was slightly worse today but now normal.  Requesting stroke work-up.  Patient denies any chest pain, shortness of breath, intermittent mild headaches at times.  Overall family thinks that she is at her baseline now but worried that she might of had another stroke. ? ?The history is provided by the patient.  ?Neurologic Problem ?This is a new problem. The current episode started 6 to 12 hours ago. Associated symptoms include headaches. Pertinent negatives include no chest pain, no abdominal pain and no shortness of breath. Nothing aggravates the symptoms. Nothing relieves the symptoms. She has tried nothing for the symptoms. The treatment provided no relief.  ? ?  ? ?Home Medications ?Prior to Admission medications   ?Medication Sig Start Date End Date Taking? Authorizing Provider  ?Acetaminophen (TYLENOL) 325 MG CAPS Take 650 mg by mouth every 6 (six) hours as needed (pain).    [provider]  ?Alirocumab (PRALUENT) 75 MG/ML SOAJ Inject 1 pen into the skin every 14 (fourteen) days. 05/24/21   Josue Hector, MD  ?amLODipine (NORVASC) 10 MG tablet Take 1 tablet (10 mg total) by mouth daily. ?Patient taking differently: Take 10 mg by mouth at bedtime. 04/12/21   Lillard Anes, MD  ?ARTIFICIAL TEAR SOLUTION OP Place 1 drop into both eyes daily as needed (dry eyes).    [provider]  ?calcium carbonate  (TUMS - DOSED IN MG ELEMENTAL CALCIUM) 500 MG chewable tablet Chew 1 tablet by mouth daily as needed for indigestion or heartburn.    [provider]  ?clopidogrel (PLAVIX) 75 MG tablet Take 1 tablet (75 mg total) by mouth daily. 04/12/21   Lillard Anes, MD  ?cyanocobalamin (,VITAMIN B-12,) 1000 MCG/ML injection Inject 1 mL (1,000 mcg total) into the muscle every 30 (thirty) days. 05/22/21   Lillard Anes, MD  ?D3-50 1.25 MG (50000 UT) capsule TAKE 1 CAPSULE BY MOUTH ONE TIME PER WEEK 04/15/21   Lillard Anes, MD  ?furosemide (LASIX) 20 MG tablet TAKE 1 TABLET (20 MG TOTAL) BY MOUTH DAILY. TAKING EVERY DAY FOR SWELLING ?Patient taking differently: Take 20 mg by mouth daily as needed for edema. 04/20/21   Lillard Anes, MD  ?latanoprost (XALATAN) 0.005 % ophthalmic solution Place 1 drop into both eyes at bedtime. 12/09/18   [provider]  ?loratadine (CLARITIN) 10 MG tablet Take 10 mg by mouth daily as needed for allergies.    [provider]  ?Multiple Vitamins-Minerals (PRESERVISION AREDS 2) CAPS Take 1 capsule by mouth daily.    [provider]  ?OVER THE COUNTER MEDICATION Apply 1 application. topically daily as needed (pain). Hempvana pain relief cream    [provider]  ?pantoprazole (PROTONIX) 40 MG tablet TAKE 1 TABLET BY MOUTH TWICE A DAY 12/13/20   Lillard Anes, MD  ?valsartan (DIOVAN) 160 MG tablet Take 2 tablets (320 mg total)  by mouth at bedtime. ?Patient taking differently: Take 160 mg by mouth at bedtime. 05/07/21   Lillard Anes, MD  ?vortioxetine HBr (TRINTELLIX) 10 MG TABS tablet Take 1 tablet (10 mg total) by mouth daily. ?Patient taking differently: Take 10 mg by mouth at bedtime. 04/12/21   Lillard Anes, MD  ?   ? ?Allergies    ?Codeine, Gabapentin, Meclizine, Requip [ropinirole hcl], and Statins   ? ?Review of Systems   ?Review of Systems  ?Respiratory:  Negative for shortness of breath.    ?Cardiovascular:  Negative for chest pain.  ?Gastrointestinal:  Negative for abdominal pain.  ?Neurological:  Positive for headaches.  ? ?Physical Exam ?Updated Vital Signs ?BP 134/70   Pulse 71   Temp 98.4 ?F (36.9 ?C) (Oral)   Resp 14   SpO2 99%  ?Physical Exam ?Vitals and nursing note reviewed.  ?Constitutional:   ?   General: She is not in acute distress. ?   Appearance: She is well-developed. She is not ill-appearing.  ?HENT:  ?   Head: Normocephalic and atraumatic.  ?   Nose: Nose normal.  ?   Mouth/Throat:  ?   Mouth: Mucous membranes are moist.  ?Eyes:  ?   Extraocular Movements: Extraocular movements intact.  ?   Conjunctiva/sclera: Conjunctivae normal.  ?   Pupils: Pupils are equal, round, and reactive to light.  ?Cardiovascular:  ?   Rate and Rhythm: Normal rate and regular rhythm.  ?   Heart sounds: No murmur heard. ?Pulmonary:  ?   Effort: Pulmonary effort is normal. No respiratory distress.  ?   Breath sounds: Normal breath sounds.  ?Abdominal:  ?   General: Abdomen is flat.  ?   Palpations: Abdomen is soft.  ?   Tenderness: There is no abdominal tenderness.  ?Musculoskeletal:     ?   General: No swelling.  ?   Cervical back: Normal range of motion and neck supple.  ?Skin: ?   General: Skin is warm and dry.  ?   Capillary Refill: Capillary refill takes less than 2 seconds.  ?Neurological:  ?   Mental Status: She is alert.  ?   Sensory: No sensory deficit.  ?   Motor: No weakness.  ?   Comments: Mild right eyelid droop but otherwise cranial nerves are intact, she appears to have 5+ out of 5 strength throughout, no drift, normal finger-nose-finger, overall normal speech, no visual field deficit  ?Psychiatric:     ?   Mood and Affect: Mood normal.  ? ? ?ED Results / Procedures / Treatments   ?Labs ?(all labs ordered are listed, but only abnormal results are displayed) ?Labs Reviewed  ?BASIC METABOLIC PANEL - Abnormal; Notable for the following components:  ?    Result Value  ? CO2 20 (*)   ? GFR,  Estimated 58 (*)   ? All other components within normal limits  ?CBC WITH DIFFERENTIAL/PLATELET  ? ? ?EKG ?EKG Interpretation ? ?Date/Time:  Wednesday July 04 2021 [id number] EDT ?Ventricular Rate:  78 ?PR Interval:  208 ?QRS Duration: 155 ?QT Interval:  421 ?QTC Calculation: 480 ?R Axis:   -35 ?Text Interpretation: Sinus rhythm Left bundle branch block Confirmed by Ronnald Nian, Esli Clements ([id number]) on 07/04/2021 8:36:52 PM ? ?Radiology ?CT HEAD WO CONTRAST (5MM) ? ?Result Date: 07/04/2021 ?CLINICAL DATA:  Slurred speech EXAM: CT HEAD WITHOUT CONTRAST TECHNIQUE: Contiguous axial images were obtained from the base of the skull through the vertex without intravenous contrast. RADIATION DOSE  REDUCTION: This exam was performed according to the departmental dose-optimization program which includes automated exposure control, adjustment of the mA and/or kV according to patient size and/or use of iterative reconstruction technique. COMPARISON:  CT 06/04/2021, 04/19/2021 FINDINGS: Brain: No acute territorial infarction, hemorrhage or intracranial mass. Atrophy and chronic small vessel ischemic changes of the white matter. Multiple small chronic left greater than right cerebellar infarcts. Small chronic left occipital infarct. Chronic lacunar infarcts within the basal ganglia and left white matter. Stable ventricle size. Vascular: No hyperdense vessels. Vertebral and carotid vascular calcification Skull: Normal. Negative for fracture or focal lesion. Sinuses/Orbits: No acute finding. Other: None IMPRESSION: 1. No definite CT evidence for acute intracranial abnormality. 2. Atrophy and chronic small vessel ischemic changes of the white matter. Multiple chronic infarcts involving the white matter, basal ganglia, left occipital lobe and cerebellum. Electronically Signed   By: Donavan Foil M.D.   On: 07/04/2021 22:00   ? ?Procedures ?Marland KitchenCritical Care ?Performed by: Lennice Sites, DO ?Authorized by: Lennice Sites, DO  ? ?Critical care provider  statement:  ?  Critical care time (minutes):  35 ?  Critical care was necessary to treat or prevent imminent or life-threatening deterioration of the following conditions:  CNS failure or compromise ?  Critical ca

## 2021-07-04 NOTE — H&P (Signed)
?History and Physical  ? ? ?[name] CWC:[id number] DOB: [birth year] DOA: 07/04/2021 ? ?PCP: [name], MD  ?Patient coming from: Home via EMS ? ?I have personally briefly reviewed patient's old medical records in [facility] ? ?Chief Complaint: Worsening right leg weakness ? ?HPI: ?[name] is a 81 y.o. female with medical history significant for history of occipital CVA, vertebral artery stenosis with occlusion, carotid artery aneurysms, CKD stage IIIa, LBBB, hypertension who presented to the ED for evaluation of slurred speech and right facial weakness.  History is supplemented by patient's daughter at bedside. ? 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  She underwent angiography with IR on 3/10, results under imaging.  She went to the ED on 3/13 for right leg pain/heaviness.  She was found to have a large right anterior thigh bruise.  CT head without contrast was obtained and was negative for acute intracranial process.  Multiple old infarcts were noted.  Venous and arterial ultrasounds were obtained and reassuring.  Case was discussed with IR who recommended patient may continue Plavix.  Patient was discharged to home. ? ?She was seen by her PCP in outpatient setting on 3/17 for new dysarthria and confusional state.  MRI head was ordered and completed on 3/23 which showed subacute infarction in the segmental and left parietal lobes. ? ?Evening of 07/04/2021 while eating supper, patient's daughter noticed that she had difficulty chewing with the right side of her mouth.  Afterwards patient's other daughter noted that she had significant dysarthria and difficulty with speech.  This was transient.  Patient also reports worsening right leg weakness compared to her recent baseline.  She has recently begun working with PT/OT at home. ? ?ED Course  Labs/Imaging on admission: I have personally reviewed  following labs and imaging studies. ? ?Initial vitals showed BP 114/99, pulse 68, RR 20, temp 98.4 ?F, SPO2 97% on room air. ? ?Labs show WBC 5.7, hemoglobin 12.2, platelets 278,000, sodium 140, potassium 3.5, bicarb 20, BUN 19, creatinine 0.98, serum glucose 99. ? ?CT head without contrast negative for acute intracranial abnormality.  Atrophy and chronic small vessel ischemic changes of the white matter seen, multiple chronic infarcts involving the white matter, basal ganglia, left supra lobe, and cerebellum also seen. ? ?MRI brain without contrast shows patchy multifocal acute ischemic nonhemorrhagic infarct involving the left cerebral hemisphere in watershed distribution with additional infarcts involving the right supra lobe and right PCA distribution. ? ?MRA head shows severe intracranial atherosclerotic disease throughout with occlusion of the left vertebral artery and intermittent near complete occlusion of the basilar artery.  Heavily diseased but patent right vertebral artery seen.  Severe high-grade stenosis at left ICA terminus seen.  Right carotid artery aneurysms also seen.  These changes similar compared to recent CTA 04/19/2021. ? ?MRA neck shows extensive atheromatous change involving proximal right greater than left subclavian arteries with superimposed penetrating atheromatous ulcers on the left.  Heavy atheromatous disease throughout both vertebral arteries moderate to severe multifocal stenosis seen.  Tortuosity with atheromatous disease involving both carotid artery systems within the neck seen without hemodynamically significant stenosis.  Changes similar compared to recent CTA. ? ?EDP discussed with on-call neurology who recommended admission for further stroke work-up.  The hospitalist service was consulted to admit for further evaluation and management. ? ?Review of Systems:  ?All systems reviewed and are negative except as documented in history of present illness above. ? ? ?  Past Medical  History:  ?Diagnosis Date  ? Benign paroxysmal vertigo, unspecified ear 06/07/2019  ? CKD (chronic kidney disease), stage III (HCC)   ? GERD (gastroesophageal reflux disease)   ? Glaucoma   ? ? ?Past Surgical History:  ?Procedure Laterality Date  ? ABDOMINAL HYSTERECTOMY    ? BACK SURGERY    ? cataract surgery    ? COLONOSCOPY N/A 08/03/2015  ? Procedure: COLONOSCOPY;  Surgeon: Carman Ching, MD;  Location: [facility] ENDOSCOPY;  Service: Endoscopy;  Laterality: N/A;  ? ESOPHAGOGASTRODUODENOSCOPY N/A 07/31/2015  ? Procedure: ESOPHAGOGASTRODUODENOSCOPY (EGD);  Surgeon: Carman Ching, MD;  Location: [facility] ENDOSCOPY;  Service: Endoscopy;  Laterality: N/A;  ? IR ANGIO INTRA EXTRACRAN SEL COM CAROTID INNOMINATE BILAT MOD SED  06/01/2021  ? IR ANGIO VERTEBRAL SEL SUBCLAVIAN INNOMINATE UNI L MOD SED  06/01/2021  ? IR RADIOLOGIST EVAL & MGMT  04/26/2021  ? OOPHORECTOMY    ? TOTAL HIP ARTHROPLASTY    ? URETER SURGERY    ? ? ?Social History: ? reports that she has never smoked. She has never used smokeless tobacco. She reports that she does not drink alcohol and does not use drugs. ? ?Allergies  ?Allergen Reactions  ? Codeine Nausea And Vomiting  ? Gabapentin   ?  Dizziness, couldn't function   ? Meclizine Other (See Comments)  ?  Lethargy, extreme sleepiness  ? Requip [Ropinirole Hcl] Nausea And Vomiting  ? Statins   ?  Muscle weakness  ? ? ?Family History  ?Problem Relation Age of Onset  ? Hypertension Mother   ? Hypertension Father   ? Heart attack Father   ? Hypertension Brother   ? Diabetes Brother   ? ? ? ?Prior to Admission medications   ?Medication Sig Start Date End Date Taking? Authorizing Provider  ?Acetaminophen (TYLENOL) 325 MG CAPS Take 650 mg by mouth every 6 (six) hours as needed (pain).   Yes [provider]  ?Alirocumab (PRALUENT) 75 MG/ML SOAJ Inject 1 pen into the skin every 14 (fourteen) days. ?Patient taking differently: Inject 75 mg into the skin every 14 (fourteen) days. 05/24/21  Yes Wendall Stade, MD   ?amLODipine (NORVASC) 10 MG tablet Take 1 tablet (10 mg total) by mouth daily. ?Patient taking differently: Take 10 mg by mouth every evening. 04/12/21  Yes [name], MD  ?ARTIFICIAL TEAR SOLUTION OP Place 1 drop into both eyes daily as needed (dry eyes).   Yes [provider]  ?calcium carbonate (TUMS - DOSED IN MG ELEMENTAL CALCIUM) 500 MG chewable tablet Chew 1 tablet by mouth daily as needed for indigestion or heartburn.   Yes [provider]  ?clopidogrel (PLAVIX) 75 MG tablet Take 1 tablet (75 mg total) by mouth daily. ?Patient taking differently: Take 75 mg by mouth every evening. 04/12/21  Yes [name], MD  ?cyanocobalamin (,VITAMIN B-12,) 1000 MCG/ML injection Inject 1 mL (1,000 mcg total) into the muscle every 30 (thirty) days. 05/22/21  Yes [name], MD  ?D3-50 1.25 MG (50000 UT) capsule TAKE 1 CAPSULE BY MOUTH ONE TIME PER WEEK ?Patient taking differently: Take 50,000 Units by mouth every Tuesday. 04/15/21  Yes [name], MD  ?furosemide (LASIX) 20 MG tablet TAKE 1 TABLET (20 MG TOTAL) BY MOUTH DAILY. TAKING EVERY DAY FOR SWELLING ?Patient taking differently: Take 20 mg by mouth daily as needed for edema. 04/20/21  Yes [name], MD  ?latanoprost (XALATAN) 0.005 % ophthalmic solution Place 1 drop into both eyes at  bedtime. 12/09/18  Yes [provider]  ?loratadine (CLARITIN) 10 MG tablet Take 10 mg by mouth daily as needed for allergies.   Yes [provider]  ?Multiple Vitamins-Minerals (PRESERVISION AREDS 2) CAPS Take 1 capsule by mouth 3 (three) times a week. No set days   Yes [provider]  ?OVER THE COUNTER MEDICATION Apply 1 application. topically daily as needed (knee pain). Hempvana pain relief cream   Yes [provider]  ?valsartan (DIOVAN) 160 MG tablet Take 2 tablets (320 mg total) by mouth at bedtime. ?Patient taking differently: Take 160 mg by mouth every evening. 05/07/21   Yes [name], MD  ?vitamin B-12 (CYANOCOBALAMIN) 1000 MCG tablet Take 1,000 mcg by mouth every evening.   Yes [provider]  ?vortioxetine HBr (TRINTELLIX) 10 MG TABS tablet Take 1 tablet (1

## 2021-07-05 ENCOUNTER — Observation Stay (HOSPITAL_COMMUNITY): Payer: Medicare HMO

## 2021-07-05 ENCOUNTER — Other Ambulatory Visit: Payer: Self-pay

## 2021-07-05 ENCOUNTER — Observation Stay (HOSPITAL_BASED_OUTPATIENT_CLINIC_OR_DEPARTMENT_OTHER): Payer: Medicare HMO

## 2021-07-05 ENCOUNTER — Encounter (HOSPITAL_COMMUNITY): Payer: Self-pay | Admitting: Internal Medicine

## 2021-07-05 DIAGNOSIS — Q283 Other malformations of cerebral vessels: Secondary | ICD-10-CM | POA: Diagnosis not present

## 2021-07-05 DIAGNOSIS — E782 Mixed hyperlipidemia: Secondary | ICD-10-CM

## 2021-07-05 DIAGNOSIS — I639 Cerebral infarction, unspecified: Secondary | ICD-10-CM | POA: Diagnosis not present

## 2021-07-05 DIAGNOSIS — N1831 Chronic kidney disease, stage 3a: Secondary | ICD-10-CM | POA: Diagnosis not present

## 2021-07-05 DIAGNOSIS — I651 Occlusion and stenosis of basilar artery: Secondary | ICD-10-CM | POA: Diagnosis not present

## 2021-07-05 DIAGNOSIS — I6603 Occlusion and stenosis of bilateral middle cerebral arteries: Secondary | ICD-10-CM | POA: Diagnosis not present

## 2021-07-05 DIAGNOSIS — K2970 Gastritis, unspecified, without bleeding: Secondary | ICD-10-CM | POA: Diagnosis not present

## 2021-07-05 DIAGNOSIS — I6503 Occlusion and stenosis of bilateral vertebral arteries: Secondary | ICD-10-CM | POA: Diagnosis not present

## 2021-07-05 DIAGNOSIS — I517 Cardiomegaly: Secondary | ICD-10-CM

## 2021-07-05 DIAGNOSIS — I10 Essential (primary) hypertension: Secondary | ICD-10-CM | POA: Diagnosis not present

## 2021-07-05 DIAGNOSIS — I6621 Occlusion and stenosis of right posterior cerebral artery: Secondary | ICD-10-CM | POA: Diagnosis not present

## 2021-07-05 DIAGNOSIS — F322 Major depressive disorder, single episode, severe without psychotic features: Secondary | ICD-10-CM | POA: Diagnosis not present

## 2021-07-05 DIAGNOSIS — R69 Illness, unspecified: Secondary | ICD-10-CM | POA: Diagnosis not present

## 2021-07-05 DIAGNOSIS — I6389 Other cerebral infarction: Secondary | ICD-10-CM | POA: Diagnosis not present

## 2021-07-05 DIAGNOSIS — K209 Esophagitis, unspecified without bleeding: Secondary | ICD-10-CM

## 2021-07-05 LAB — CBC
HCT: 34 % — ABNORMAL LOW (ref 36.0–46.0)
Hemoglobin: 11 g/dL — ABNORMAL LOW (ref 12.0–15.0)
MCH: 30.6 pg (ref 26.0–34.0)
MCHC: 32.4 g/dL (ref 30.0–36.0)
MCV: 94.4 fL (ref 80.0–100.0)
Platelets: 249 10*3/uL (ref 150–400)
RBC: 3.6 MIL/uL — ABNORMAL LOW (ref 3.87–5.11)
RDW: 12.7 % (ref 11.5–15.5)
WBC: 5.3 10*3/uL (ref 4.0–10.5)
nRBC: 0 % (ref 0.0–0.2)

## 2021-07-05 LAB — BASIC METABOLIC PANEL
Anion gap: 4 — ABNORMAL LOW (ref 5–15)
BUN: 16 mg/dL (ref 8–23)
CO2: 23 mmol/L (ref 22–32)
Calcium: 9.6 mg/dL (ref 8.9–10.3)
Chloride: 111 mmol/L (ref 98–111)
Creatinine, Ser: 0.9 mg/dL (ref 0.44–1.00)
GFR, Estimated: >60 mL/min (ref >60)
Glucose, Bld: 99 mg/dL (ref 70–99)
Potassium: 3.6 mmol/L (ref 3.5–5.1)
Sodium: 138 mmol/L (ref 135–145)

## 2021-07-05 LAB — LIPID PANEL
Cholesterol: 156 mg/dL (ref 0–200)
HDL: 36 mg/dL — ABNORMAL LOW (ref >40)
LDL Cholesterol: 78 mg/dL (ref 0–99)
Total CHOL/HDL Ratio: 4.3 RATIO
Triglycerides: 212 mg/dL — ABNORMAL HIGH (ref <150)
VLDL: 42 mg/dL — ABNORMAL HIGH (ref 0–40)

## 2021-07-05 LAB — ECHOCARDIOGRAM COMPLETE
Area-P 1/2: 3.17 cm2
Calc EF: 61.4 %
S' Lateral: 2.3 cm
Single Plane A2C EF: 62.3 %
Single Plane A4C EF: 63 %

## 2021-07-05 LAB — HEMOGLOBIN A1C
Hgb A1c MFr Bld: 5 % (ref 4.8–5.6)
Mean Plasma Glucose: 96.8 mg/dL

## 2021-07-05 MED ORDER — SENNOSIDES-DOCUSATE SODIUM 8.6-50 MG PO TABS: 1.0000 | ORAL_TABLET | Freq: Every evening | ORAL | Status: DC | PRN

## 2021-07-05 MED ORDER — ALIROCUMAB 75 MG/ML ~~LOC~~ SOAJ: 75.0000 mg | SUBCUTANEOUS | Status: DC

## 2021-07-05 MED ORDER — VORTIOXETINE HBR 5 MG PO TABS
10.0000 mg | ORAL_TABLET | Freq: Every evening | ORAL | Status: DC
  Administered 2021-07-05: 10 mg via ORAL
  Filled 2021-07-05 (×2): qty 2

## 2021-07-05 MED ORDER — AMLODIPINE BESYLATE 10 MG PO TABS
10.0000 mg | ORAL_TABLET | Freq: Every evening | ORAL | Status: DC
  Administered 2021-07-05: 10 mg via ORAL
  Filled 2021-07-05: qty 1

## 2021-07-05 MED ORDER — STROKE: EARLY STAGES OF RECOVERY BOOK
Freq: Once | Status: AC
  Filled 2021-07-05: qty 1

## 2021-07-05 MED ORDER — SODIUM CHLORIDE 0.9 % IV SOLN: INTRAVENOUS | Status: DC

## 2021-07-05 MED ORDER — ACETAMINOPHEN 160 MG/5ML PO SOLN: 650.0000 mg | ORAL | Status: DC | PRN

## 2021-07-05 MED ORDER — CLOPIDOGREL BISULFATE 75 MG PO TABS
75.0000 mg | ORAL_TABLET | Freq: Every evening | ORAL | Status: DC
  Administered 2021-07-05: 75 mg via ORAL
  Filled 2021-07-05: qty 1

## 2021-07-05 MED ORDER — FAMOTIDINE 20 MG PO TABS
20.0000 mg | ORAL_TABLET | Freq: Two times a day (BID) | ORAL | Status: DC
  Administered 2021-07-05 – 2021-07-06 (×3): 20 mg via ORAL
  Filled 2021-07-05 (×3): qty 1

## 2021-07-05 MED ORDER — PANTOPRAZOLE SODIUM 40 MG PO TBEC
40.0000 mg | DELAYED_RELEASE_TABLET | Freq: Two times a day (BID) | ORAL | Status: DC
  Filled 2021-07-05: qty 1

## 2021-07-05 MED ORDER — VITAMIN D (ERGOCALCIFEROL) 1.25 MG (50000 UNIT) PO CAPS: 50000.0000 [IU] | ORAL_CAPSULE | ORAL | Status: DC

## 2021-07-05 MED ORDER — IOHEXOL 350 MG/ML SOLN
75.0000 mL | Freq: Once | INTRAVENOUS | Status: AC | PRN
  Administered 2021-07-05: 75 mL via INTRAVENOUS

## 2021-07-05 MED ORDER — HEPARIN SODIUM (PORCINE) 5000 UNIT/ML IJ SOLN
5000.0000 [IU] | Freq: Three times a day (TID) | INTRAMUSCULAR | Status: DC
  Administered 2021-07-05: 5000 [IU] via SUBCUTANEOUS
  Filled 2021-07-05: qty 1

## 2021-07-05 MED ORDER — ASPIRIN EC 81 MG PO TBEC
81.0000 mg | DELAYED_RELEASE_TABLET | Freq: Every day | ORAL | Status: DC
  Administered 2021-07-06: 81 mg via ORAL
  Filled 2021-07-05: qty 1

## 2021-07-05 MED ORDER — LATANOPROST 0.005 % OP SOLN
1.0000 [drp] | Freq: Every day | OPHTHALMIC | Status: DC
  Administered 2021-07-05: 1 [drp] via OPHTHALMIC
  Filled 2021-07-05: qty 2.5

## 2021-07-05 MED ORDER — PANTOPRAZOLE SODIUM 40 MG PO TBEC
40.0000 mg | DELAYED_RELEASE_TABLET | Freq: Every day | ORAL | Status: DC
Start: 2021-07-05 — End: 2021-07-05

## 2021-07-05 MED ORDER — ACETAMINOPHEN 650 MG RE SUPP: 650.0000 mg | RECTAL | Status: DC | PRN

## 2021-07-05 MED ORDER — VITAMIN B-12 1000 MCG PO TABS
1000.0000 ug | ORAL_TABLET | Freq: Every evening | ORAL | Status: DC
  Administered 2021-07-05: 1000 ug via ORAL
  Filled 2021-07-05: qty 1

## 2021-07-05 MED ORDER — LORATADINE 10 MG PO TABS
10.0000 mg | ORAL_TABLET | Freq: Every day | ORAL | Status: DC
  Administered 2021-07-05 – 2021-07-06 (×2): 10 mg via ORAL
  Filled 2021-07-05 (×3): qty 1

## 2021-07-05 MED ORDER — ACETAMINOPHEN 325 MG PO TABS: 650.0000 mg | ORAL_TABLET | ORAL | Status: DC | PRN

## 2021-07-05 NOTE — Assessment & Plan Note (Signed)
>>  ASSESSMENT AND PLAN FOR ESOPHAGITIS WITH GASTRITIS WRITTEN ON 07/06/2021  2:49 PM BY THOMPSON, Lovey Newcomer, MD  - Patient with history of severe esophagitis and gastritis. -Per daughter was on a PPI however discontinued due to decreased vitamin B12 levels. -Placed on Pepcid 20 mg twice daily during the hospitalization which patient will be discharged home on. -Outpatient follow-up with PCP.

## 2021-07-05 NOTE — Assessment & Plan Note (Addendum)
Not on statin due to intolerance.   ?-Prescribed Praluent every 14 days. ?

## 2021-07-05 NOTE — Consult Note (Signed)
Neurology Consultation ?Reason for Consult: Stroke ?Referring Physician: Vernell Barrier, A ? ?CC: Right leg weakness ? ?History is obtained from: Patient, daughter ? ?HPI: [name] is a 81 y.o. female with a history of vertigo who was evaluated with a CT angiogram in January.  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  She was therefore referred to interventional neuroradiology as well as vascular surgery.  She was evaluated by Dr. Estanislado Pandy who performed an angiogram on 3/10 felt that there was significant risk with treating these aneurysms/stenosis and therefore a plan for watchful waiting was pursued.   ? ?Following this angiogram she developed thigh hematoma and she presented to the emergency department on 3/13. She noticed some right leg weakness at the time.  She had an MR venogram done on 3/23 which demonstrated some abnormal areas of enhancement felt to most likely represent subacute strokes. ? ?Since that time, she has had some problems with her speech, but this worsened over the past couple of days and therefore she presented to the emergency department again where an MRI was performed which demonstrates multiple areas of acute ischemic infarct. ? ?LKW: Several weeks ago, worsening starting at 1 PM today ?tpa given?: no, outside of window ? ? ?ROS: A 14 point ROS was performed and is negative except as noted in the HPI.  ? ?Past Medical History:  ?Diagnosis Date  ? Benign paroxysmal vertigo, unspecified ear 06/07/2019  ? CKD (chronic kidney disease), stage III ([place])   ? GERD (gastroesophageal reflux disease)   ? Glaucoma   ? ? ? ?Family History  ?Problem Relation Age of Onset  ? Hypertension Mother   ? Hypertension Father   ? Heart attack Father   ? Hypertension Brother   ? Diabetes Brother   ? ? ? ?Social History:  reports that she has never smoked. She has never used smokeless tobacco. She reports that she does not drink alcohol and does not use  drugs. ? ? ?Exam: ?Current vital signs: ?BP 132/88   Pulse 71   Temp 98.4 ?F (36.9 ?C) (Oral)   Resp 15   SpO2 97%  ?Vital signs in last 24 hours: ?Temp:  [98.4 ?F (36.9 ?C)] 98.4 ?F (36.9 ?C) (04/12 2100) ?Pulse Rate:  [50-72] 71 (04/12 2345) ?Resp:  [11-20] 15 (04/12 2345) ?BP: (114-134)/(70-122) 132/88 (04/12 2345) ?SpO2:  [96 %-100 %] 97 % (04/12 2345) ? ? ?Physical Exam  ?Constitutional: Appears well-developed and well-nourished.  ?Psych: Affect appropriate to situation ?Eyes: No scleral injection ?HENT: No OP obstruction ?MSK: no joint deformities.  ?Cardiovascular: Normal rate and regular rhythm.  ?Respiratory: Effort normal, non-labored breathing ?GI: Soft.  No distension. There is no tenderness.  ?Skin: WDI ? ?Neuro: ?Mental Status: ?Patient is awake, alert, she is able to correctly identify the month and year when given choices, but cannot find the words for them.  She has significant difficulty with repetition. ?No signs neglect ?Cranial Nerves: ?II: Visual Fields are full. Pupils are equal, round, and reactive to light.   ?III,IV, VI: EOMI without ptosis or diploplia.  ?V: Facial sensation is symmetric to temperature ?VII: Facial movement with mild delay on the right smile ?VIII: hearing is intact to voice ?X: Uvula elevates symmetrically ?XI: Shoulder shrug is symmetric. ?XII: tongue is midline without atrophy or fasciculations.  ?Motor: ?Tone is normal. Bulk is normal. 5/5 strength was present in bilateral arms, and her right leg she has 4/5 weakness, 5/5 in left ?Sensory: ?Sensation  is s diminished in the right leg ?Cerebellar: ?No clear ataxia.  His finger ? ? ? ? ?I have reviewed labs in epic and the results pertinent to this consultation are: ?Creatinine 0.97 ? ?I have reviewed the images obtained: MRI brain-embolic appearing infarcts in the posterior and left MCA distribution.  They appear to me to be slightly more acute than a month ago, but this is sometimes very difficult to tease out,  certainly, on the [id number] some of them appear to have been present at the time. ? ?Given that some of them appear more acute, I would favor assessing for cardioembolic sources as well as repeating CT angiogram. ? ?Impression: 81 year old female with embolic appearing strokes.  Family reports initial symptom onset around the same time as her hematoma of the right leg, which was a few days after the angiogram.  Certainly with report of worsening and with acute appearance on MRI, I would favor further work-up at this time. ? ?Recommendations: ?- HgbA1c, fasting lipid panel ?- Frequent neuro checks ?- Echocardiogram ?- CTA head and neck ?- Prophylactic therapy-continue home Plavix ?- Risk factor modification ?- Telemetry monitoring ?- PT consult, OT consult, Speech consult ?- Stroke team to follow ? ? ? ?Roland Rack, MD ?[facility] ?[phone] ? ?If 7pm- 7am, please page neurology on call as listed in [place]. ? ?

## 2021-07-05 NOTE — Evaluation (Signed)
Physical Therapy Evaluation ?Patient Details ?Name: [name] ?MRN: [id number] ?DOB: January 16, 1941 ?Today's Date: 07/05/2021 ? ?History of Present Illness ? 81 y.o. female presents to [facility] hospital on 07/04/2021 with slurred speech and R facial weakness. Pt recently found to have intracranial aneurysms of head and neck and severe vertebrobasilar arteriosclerotic disease. MRI brain 4/12 shows multifocal acute ischemic nonhemorrhagic infarct involving the left cerebral hemisphere with additional infarcts in right supra lobe and right PCA distribution. PMH includes BPPV, CKD, GERD, glaucoma.  ?Clinical Impression ? Pt presents to PT with deficits in sensation, gait, balance, power, strength, functional mobility, vision, and cognition. Pt reports heaviness and reduced sensation in right side, with R foot drag noted during ambulation. Pt requires verbal cues for DME management when ambulating, family reports this has been an ongoing challenge with HHPT. Pt and family express the desire to return home with continued HHPT and family support. Acute PT will continue to follow in an effort to improve strength and reduce caregiver burden.   ?   ? ?Recommendations for follow up therapy are one component of a multi-disciplinary discharge planning process, led by the attending physician.  Recommendations may be updated based on patient status, additional functional criteria and insurance authorization. ? ?Follow Up Recommendations Home health PT ? ?  ?Assistance Recommended at Discharge Frequent or constant Supervision/Assistance  ?Patient can return home with the following ? A little help with walking and/or transfers;A little help with bathing/dressing/bathroom;Help with stairs or ramp for entrance;Assist for transportation ? ?  ?Equipment Recommendations None recommended by PT  ?Recommendations for Other Services ?    ?  ?Functional Status Assessment Patient has had a recent decline in their functional status and demonstrates the  ability to make significant improvements in function in a reasonable and predictable amount of time.  ? ?  ?Precautions / Restrictions Precautions ?Precautions: Fall ?Restrictions ?Weight Bearing Restrictions: No  ? ?  ? ?Mobility ? Bed Mobility ?Overal bed mobility: Needs Assistance ?Bed Mobility: Supine to Sit, Sit to Supine ?  ?  ?Supine to sit: Supervision ?Sit to supine: Min guard ?  ?  ?  ? ?Transfers ?Overall transfer level: Needs assistance ?Equipment used: Rolling walker (2 wheels) ?Transfers: Sit to/from Stand ?Sit to Stand: Min guard ?  ?  ?  ?  ?  ?  ?  ? ?Ambulation/Gait ?Ambulation/Gait assistance: Min guard ?Gait Distance (Feet): 100 Feet ?Assistive device: Rolling walker (2 wheels) ?Gait Pattern/deviations: Step-through pattern, Decreased dorsiflexion - right, Trunk flexed ?Gait velocity: reduced ?Gait velocity interpretation: <1.31 ft/sec, indicative of household ambulator ?  ?General Gait Details: pt with slowed step-through gait, increased trunk flexion and requiring PRN cues to maintain RW closer to BOS. Pt also with R foot drag with fatigue ? ?Stairs ?  ?  ?  ?  ?  ? ?Wheelchair Mobility ?  ? ?Modified Rankin (Stroke Patients Only) ?Modified Rankin (Stroke Patients Only) ?Pre-Morbid Rankin Score: Moderately severe disability ?Modified Rankin: Moderately severe disability ? ?  ? ?Balance Overall balance assessment: Needs assistance ?Sitting-balance support: No upper extremity supported, Feet supported ?Sitting balance-Leahy Scale: Good ?  ?  ?Standing balance support: Single extremity supported, Bilateral upper extremity supported, Reliant on assistive device for balance ?Standing balance-Leahy Scale: Poor ?  ?  ?  ?  ?  ?  ?  ?  ?  ?  ?  ?  ?   ? ? ? ?Pertinent Vitals/Pain Pain Assessment ?Pain Assessment: No/denies pain  ? ? ?Home Living  Family/patient expects to be discharged to:: Private residence ?Living Arrangements: Spouse/significant other ?Available Help at Discharge: Family;Available  PRN/intermittently (near 24/7 per daughters) ?Type of Home: House ?Home Access: Stairs to enter ?Entrance Stairs-Rails: Right ?Entrance Stairs-Number of Steps: 2 ?  ?Home Layout: One level ?Home Equipment: Conservation officer, nature (2 wheels);Cane - single point;BSC/3in1;Transport chair ?   ?  ?Prior Function Prior Level of Function : Needs assist ?  ?  ?  ?  ?  ?  ?Mobility Comments: pt has been woring with HHPT on ambulating with RW since recent CVAs ?ADLs Comments: family assist with bathing and IADLs since recent CVAs ?  ? ? ?Hand Dominance  ? Dominant Hand: Right ? ?  ?Extremity/Trunk Assessment  ? Upper Extremity Assessment ?Upper Extremity Assessment: RUE deficits/detail ?RUE Deficits / Details: grossly 4/5 ?RUE Sensation: decreased light touch ?  ? ?Lower Extremity Assessment ?Lower Extremity Assessment: RLE deficits/detail ?RLE Deficits / Details: grossly 4/5 ?RLE Sensation: decreased light touch ?  ? ?Cervical / Trunk Assessment ?Cervical / Trunk Assessment: Kyphotic  ?Communication  ? Communication: No difficulties  ?Cognition Arousal/Alertness: Awake/alert ?Behavior During Therapy: [facility] for tasks assessed/performed ?Overall Cognitive Status: History of cognitive impairments - at baseline ?  ?  ?  ?  ?  ?  ?  ?  ?  ?  ?  ?  ?  ?  ?  ?  ?General Comments: short term memory impairment ?  ?  ? ?  ?General Comments General comments (skin integrity, edema, etc.): VSS on RA, pt reports blurred vision, denies double vision. PT notes dysconjugate gaze ? ?  ?Exercises    ? ?Assessment/Plan  ?  ?PT Assessment Patient needs continued PT services  ?PT Problem List Decreased strength;Decreased activity tolerance;Decreased balance;Decreased mobility;Decreased cognition;Impaired sensation ? ?   ?  ?PT Treatment Interventions Gait training;DME instruction;Stair training;Functional mobility training;Therapeutic activities;Therapeutic exercise;Balance training;Neuromuscular re-education;Patient/family education   ? ?PT Goals (Current  goals can be found in the Care Plan section)  ?Acute Rehab PT Goals ?Patient Stated Goal: to return home with continued family support ?PT Goal Formulation: With patient/family ?Time For Goal Achievement: 07/19/21 ?Potential to Achieve Goals: Good ? ?  ?Frequency Min 4X/week ?  ? ? ?Co-evaluation   ?  ?  ?  ?  ? ? ?  ?AM-PAC PT "6 Clicks" Mobility  ?Outcome Measure Help needed turning from your back to your side while in a flat bed without using bedrails?: None ?Help needed moving from lying on your back to sitting on the side of a flat bed without using bedrails?: A Little ?Help needed moving to and from a bed to a chair (including a wheelchair)?: A Little ?Help needed standing up from a chair using your arms (e.g., wheelchair or bedside chair)?: A Little ?Help needed to walk in hospital room?: A Little ?Help needed climbing 3-5 steps with a railing? : Total ?6 Click Score: 17 ? ?  ?End of Session   ?Activity Tolerance: Patient tolerated treatment well ?Patient left: in bed;with call bell/phone within reach;with family/visitor present ?Nurse Communication: Mobility status ?PT Visit Diagnosis: Other abnormalities of gait and mobility (R26.89);Other symptoms and signs involving the nervous system (R29.898) ?  ? ?Time: 1104-1130 ?PT Time Calculation (min) (ACUTE ONLY): 26 min ? ? ?Charges:   PT Evaluation ?$PT Eval Low Complexity: 1 Low ?  ?  ?   ? ? ?Zenaida Niece, PT, DPT ?Acute Rehabilitation ?Pager: [phone] ?Office [phone] ? ? ?Zenaida Niece ?07/05/2021, 12:33 PM ?

## 2021-07-05 NOTE — Assessment & Plan Note (Addendum)
Right occipital lobe infarcts ?Admitted with multifocal embolic appearing infarcts in posterior and left MCA distribution.  Has significant atherosclerotic disease of head and neck as well as intracranial aneurysms and severe vertebrobasilar arteriosclerotic disease. 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  Atheromatous disease involving anterior circulation with severe high-grade stenosis at the left ICA terminus, moderate diffuse narrowing/stenosis involving the left greater than right M1 segments. ?-2D echo with EF of 70 to 75%, NWMA, grade 1 diastolic dysfunction, no source of emboli noted. ?-May need TEE however not recommended per neurology. ?-Patient maintained on aspirin and Plavix early on during the hospitalization.   ?-Patient seen by PT/OT/SLP who recommended home health therapies. ?-Not on statin due to intolerance, is on Praluent q14 days which has been reordered. ?-Neurology recommended dual antiplatelet therapy of aspirin and Brilinta for 3 months followed by Brilinta alone. ?-Neurology offered [facility] stroke prevention trial however family declined to participate in it. ?-Outpatient follow-up with primary neurologist, Dr. Tomi Likens in 4 weeks. ?

## 2021-07-05 NOTE — Assessment & Plan Note (Addendum)
-  Remained stable during the hospitalization. ?

## 2021-07-05 NOTE — Assessment & Plan Note (Addendum)
-  On presentation patient's antihypertensive medications were held to allow permissive hypertension.   ?-Patient gently hydrated with IV fluids.   ?-Patient's home regimen amlodipine was resumed.   ?-Patient's home regimen of valsartan will be resumed 1 to 2 days post discharge.   ?-Outpatient follow-up with PCP. ?

## 2021-07-05 NOTE — ED Notes (Signed)
Patient was given a cup of ice water. 

## 2021-07-05 NOTE — Progress Notes (Signed)
?  Echocardiogram ?2D Echocardiogram has been performed. ? ?Augustine Radar ?07/05/2021, 9:38 AM ?

## 2021-07-05 NOTE — Progress Notes (Signed)
?PROGRESS NOTE ? ? ? ?[name]  IHK:[id number] DOB: Sep 14, 1940 DOA: 07/04/2021 ?PCP: [name], MD  ? ? ?Chief Complaint  ?Patient presents with  ? Neurologic Problem  ? ? ?Brief Narrative:  ?[name] is a 81 y.o. female with medical history significant for history of occipital CVA, vertebral artery stenosis with occlusion, carotid artery aneurysms, CKD stage IIIa, LBBB, hypertension who is admitted with embolic appearing infarcts in the posterior and left MCA distribution.  Admitted for further stroke work-up per neurology.  ? ? ?Assessment & Plan: ? Principal Problem: ?  Acute multifocal cerebral infarction, left hemisphere [facility]) ?Active Problems: ?  Essential hypertension ?  Mixed hyperlipidemia ?  Chronic kidney disease, stage 3a (HCC) ?  Depression, major, single episode, severe (HCC) ?  Esophagitis with gastritis ? ? ? ?Assessment and Plan: ?* Acute multifocal cerebral infarction, left hemisphere [facility]) ?Right occipital lobe infarcts ?Admitted with multifocal embolic appearing infarcts in posterior and left MCA distribution.  Has significant atherosclerotic disease of head and neck as well as intracranial aneurysms and severe vertebrobasilar arteriosclerotic disease. 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  Atheromatous disease involving anterior circulation with severe high-grade stenosis at the left ICA terminus, moderate diffuse narrowing/stenosis involving the left greater than right M1 segments. ?-2D echo with EF of 70 to 75%, NWMA, grade 1 diastolic dysfunction, no source of emboli noted. ?-May need TEE however will defer to neurology. ?-Continue  home Plavix 75 mg daily ?-Continue neurochecks, monitor on telemetry ?-PT/OT/SLP eval ?-Not on statin due to intolerance, is on Praluent q14 days which has been reordered. ?-Per neurology. ? ?Essential hypertension ?Holding home amlodipine and valsartan to allow permissive hypertension for now. ? ?Mixed hyperlipidemia ?Not on statin due to intolerance.   ?-Prescribed Praluent every 14 days. ? ?Esophagitis with gastritis ?- Patient with history of severe esophagitis and gastritis. ?-Per daughter was on a PPI however discontinued due to decreased vitamin B12 levels. ?-Place on Pepcid 20 mg twice daily. ?-Outpatient follow-up with PCP. ? ?Depression, major, single episode, severe (HCC) ?-Continue home Trintellix. ? ?Chronic kidney disease, stage 3a (HCC) ?Currently stable, continue to monitor. ? ? ? ? ?  ? ? ?DVT prophylaxis: SCDs ?Code Status: DNR ?Family Communication: Updated patient, daughter at bedside. ?Disposition: TBD ? ?Status is: Observation ?The patient remains OBS appropriate and will d/c before 2 midnights. ?  ?Consultants:  ?Neurology: Dr. Amada Jupiter 07/05/2021 ? ?Procedures:  ?CT head 07/04/2021 ?CT angiogram head and neck 07/05/2021 ?MRI brain 07/04/2021 ?MRA head and neck 07/04/2021 ? ?Antimicrobials:  ?None ? ? ?Subjective: ?Patient laying in bed.  Alert to self. Thinks she is in [place] office.  Knows she is in [place] however unable to name the state until she is prompted.  Expressie phasia.  Daughter at bedside stating patient was taken off PPI due to it blocking intrinsic factor decreasing her vitamin B12 levels and will rather have her mother not on Protonix.  Daughter also stating patient with complaints of pain and bruising with subcutaneous heparin. ? ?Objective: ?Vitals:  ? 07/05/21 0845 07/05/21 0900 07/05/21 0915 07/05/21 1015  ?BP: (!) 136/124 (!) 138/55 (!) 78/16 (!) 152/81  ?Pulse: 73 71 62 71  ?  Resp: (!) 21 20 14 16   ?Temp:      ?TempSrc:      ?SpO2: 97% 95% 97% 97%  ? ?No intake or output  data in the 24 hours ending 07/05/21 1111 ?There were no vitals filed for this visit. ? ?Examination: ? ?General exam: Appears calm and comfortable  ?Respiratory system: Clear to auscultation. Respiratory effort normal. ?Cardiovascular system: S1 & S2 heard, RRR. No JVD, murmurs, rubs, gallops or clicks. No pedal edema. ?Gastrointestinal system: Abdomen is nondistended, soft and nontender. No organomegaly or masses felt. Normal bowel sounds heard. ?Central nervous system: Alert and oriented.  Expressive aphasia.  Moving extremities spontaneously.  ?Extremities: Symmetric 5 x 5 power. ?Skin: No rashes, lesions or ulcers ?Psychiatry: Judgement and insight appear fair to normal.  Mood & affect appropriate.  ? ? ? ?Data Reviewed:  ? ?CBC: ?Recent Labs  ?Lab 07/04/21 ?2049 07/05/21 ?07/07/21  ?WBC 5.7 5.3  ?NEUTROABS 2.8  --   ?HGB 12.2 11.0*  ?HCT 38.6 34.0*  ?MCV 96.7 94.4  ?PLT 278 249  ? ? ?Basic Metabolic Panel: ?Recent Labs  ?Lab 07/04/21 ?2049 07/05/21 ?07/07/21  ?NA 140 138  ?K 3.5 3.6  ?CL 111 111  ?CO2 20* 23  ?GLUCOSE 99 99  ?BUN 19 16  ?CREATININE 0.98 0.90  ?CALCIUM 10.1 9.6  ? ? ?GFR: ?CrCl cannot be calculated (Unknown ideal weight.). ? ?Liver Function Tests: ?No results for input(s): AST, ALT, ALKPHOS, BILITOT, PROT, ALBUMIN in the last 168 hours. ? ?CBG: ?No results for input(s): GLUCAP in the last 168 hours. ? ? ?No results found for this or any previous visit (from the past 240 hour(s)).  ? ? ? ? ? ?Radiology Studies: ?CT ANGIO HEAD NECK W WO CM ? ?Result Date: 07/05/2021 ?CLINICAL DATA:  Follow-up examination for stroke. EXAM: CT ANGIOGRAPHY HEAD AND NECK TECHNIQUE: Multidetector CT imaging of the head and neck was performed using the standard protocol during bolus administration of intravenous contrast. Multiplanar CT image reconstructions and MIPs were obtained to evaluate the vascular anatomy. Carotid stenosis measurements (when applicable) are obtained utilizing NASCET criteria, using the distal internal  carotid diameter as the denominator. RADIATION DOSE REDUCTION: This exam was performed according to the departmental dose-optimization program which includes automated exposure control, adjustment of the mA and/or kV according to patient size and/or use of iterative reconstruction technique. CONTRAST:  65mL OMNIPAQUE IOHEXOL 350 MG/ML SOLN COMPARISON:  Prior MRA from earlier the same evening and CTA from 04/19/2021. FINDINGS: CTA NECK FINDINGS Aortic arch: Normal caliber with moderate atheromatous plaque. Irregular soft plaque protrudes into the lumen at the isthmus. Two vessel branching pattern. Right carotid system: Diffuse tortuosity. Mixed density atheromatous change at the right bifurcation without hemodynamically significant stenosis, stable. Left carotid system: Diffuse tortuosity. Mixed density plaque about the left carotid bifurcation without hemodynamically significant stenosis, stable. Vertebral arteries: Extensive atheromatous change involving the proximal left subclavian artery with associated stenosis of up to 70%. Superimposed 2 areas of penetrating atheromatous ulcers again noted. Appearance is stable. Severe proximal right subclavian artery stenosis of up to approximately 90%, also stable. Mixed density plaque at the origin of the right vertebral artery with severe stenosis. Extensive atheromatous disease with multifocal moderate to severe segmental stenoses involving the right greater than left V2/V3 segments. Appearance is relatively stable. Skeleton: No worrisome osseous lesions.  Mild cervical spondylosis. Other neck: No other acute finding within the neck. Upper chest: No acute finding. Review of the MIP images confirms the above findings CTA  HEAD FINDINGS Anterior circulation: Scattered atheromatous plaque throughout the carotid siphons. Associated severe stenosis at the left ICA terminus, stable. Left A1 hypoplastic. Dominant right A1 patent. Lobulated right carotid siphon aneurysm measures 8  x 5 mm, not significantly changed. 3 mm aneurysm extending from the right carotid terminus, also stable. 1 mm anterior communicating artery aneurysm, also stable. Atheromatous irregularity about both AC

## 2021-07-05 NOTE — Assessment & Plan Note (Addendum)
-   Patient with history of severe esophagitis and gastritis. ?-Per daughter was on a PPI however discontinued due to decreased vitamin B12 levels. ?-Placed on Pepcid 20 mg twice daily during the hospitalization which patient will be discharged home on. ?-Outpatient follow-up with PCP. ?

## 2021-07-05 NOTE — Assessment & Plan Note (Signed)
>>  ASSESSMENT AND PLAN FOR ESSENTIAL HYPERTENSION WRITTEN ON 07/06/2021  2:49 PM BY THOMPSON, DANIEL V, MD  -On presentation patient's antihypertensive medications were held to allow permissive hypertension.   -Patient gently hydrated with IV fluids.   -Patient's home regimen amlodipine was resumed.   -Patient's home regimen of valsartan will be resumed 1 to 2 days post discharge.   -Outpatient follow-up with PCP.

## 2021-07-05 NOTE — Assessment & Plan Note (Addendum)
-   Patient was maintained on home regimen of Trintellix. ?

## 2021-07-05 NOTE — ED Notes (Signed)
Reached out to admitting provider, Rodolph Bong, MD, regarding pts requesting with heparin in relation to her history of bleeding ulcers ?

## 2021-07-05 NOTE — Hospital Course (Signed)
[name] is a 81 y.o. female with medical history significant for history of occipital CVA, vertebral artery stenosis with occlusion, carotid artery aneurysms, CKD stage IIIa, LBBB, hypertension who is admitted with embolic appearing infarcts in the posterior and left MCA distribution.  Admitted for further stroke work-up per neurology. ?

## 2021-07-05 NOTE — Progress Notes (Addendum)
STROKE TEAM PROGRESS NOTE  ? ?INTERVAL HISTORY ?[name] is an 81 year old female with a PMHx of bilateral brainstem strokes in 2017 thought to be 2/2 to severe atheromatous posterior circulation disease. She underwent a CT angiogram in January showing progression of significant bilateral vertebral stenosis as well as multiple aneurysms. She was then evaluated with an cerebral arteriogram by Dr. Corliss Skains on 06/01/21, with no intervention done. The patient's daughter reports noticing new aphasia, slurred speech, and facial droop and brought the patient in for evaluation. Per previous note by Dr. Amada Jupiter, the patient has had some speech difficulties ongoing for several days. MRI notable for patchy left hemispheric infarcts and right PCA territory infarcts.   ? ?Vitals:  ? 07/05/21 1130 07/05/21 1145 07/05/21 1200 07/05/21 1215  ?BP: (!) 190/64 (!) 193/69 (!) 146/84 (!) 174/79  ?Pulse: 76 72 72 72  ?Resp: 18 (!) 21 19 (!) 23  ?Temp:      ?TempSrc:      ?SpO2: 98% 95% 98% 98%  ? ?CBC:  ?Recent Labs  ?Lab 07/04/21 ?2049 07/05/21 ?1660  ?WBC 5.7 5.3  ?NEUTROABS 2.8  --   ?HGB 12.2 11.0*  ?HCT 38.6 34.0*  ?MCV 96.7 94.4  ?PLT 278 249  ? ?Basic Metabolic Panel:  ?Recent Labs  ?Lab 07/04/21 ?2049 07/05/21 ?6301  ?NA 140 138  ?K 3.5 3.6  ?CL 111 111  ?CO2 20* 23  ?GLUCOSE 99 99  ?BUN 19 16  ?CREATININE 0.98 0.90  ?CALCIUM 10.1 9.6  ? ?Lipid Panel:  ?Recent Labs  ?Lab 07/05/21 ?6010  ?CHOL 156  ?TRIG 212*  ?HDL 36*  ?CHOLHDL 4.3  ?VLDL 42*  ?LDLCALC 78  ? ?HgbA1c:  ?Recent Labs  ?Lab 07/05/21 ?9323  ?HGBA1C 5.0  ? ?Urine Drug Screen: No results for input(s): LABOPIA, COCAINSCRNUR, LABBENZ, AMPHETMU, THCU, LABBARB in the last 168 hours.  ?Alcohol Level No results for input(s): ETH in the last 168 hours. ? ?IMAGING past 24 hours ?CT ANGIO HEAD NECK W WO CM ? ?Result Date: 07/05/2021 ?CLINICAL DATA:  Follow-up examination for stroke. EXAM: CT ANGIOGRAPHY HEAD AND NECK TECHNIQUE: Multidetector CT imaging of the head and  neck was performed using the standard protocol during bolus administration of intravenous contrast. Multiplanar CT image reconstructions and MIPs were obtained to evaluate the vascular anatomy. Carotid stenosis measurements (when applicable) are obtained utilizing NASCET criteria, using the distal internal carotid diameter as the denominator. RADIATION DOSE REDUCTION: This exam was performed according to the departmental dose-optimization program which includes automated exposure control, adjustment of the mA and/or kV according to patient size and/or use of iterative reconstruction technique. CONTRAST:  37mL OMNIPAQUE IOHEXOL 350 MG/ML SOLN COMPARISON:  Prior MRA from earlier the same evening and CTA from 04/19/2021. FINDINGS: CTA NECK FINDINGS Aortic arch: Normal caliber with moderate atheromatous plaque. Irregular soft plaque protrudes into the lumen at the isthmus. Two vessel branching pattern. Right carotid system: Diffuse tortuosity. Mixed density atheromatous change at the right bifurcation without hemodynamically significant stenosis, stable. Left carotid system: Diffuse tortuosity. Mixed density plaque about the left carotid bifurcation without hemodynamically significant stenosis, stable. Vertebral arteries: Extensive atheromatous change involving the proximal left subclavian artery with associated stenosis of up to 70%. Superimposed 2 areas of penetrating atheromatous ulcers again noted. Appearance is stable. Severe proximal right subclavian artery stenosis of up to approximately 90%, also stable. Mixed density plaque at the origin of the right vertebral artery with severe stenosis. Extensive atheromatous disease with multifocal moderate to severe segmental  stenoses involving the right greater than left V2/V3 segments. Appearance is relatively stable. Skeleton: No worrisome osseous lesions.  Mild cervical spondylosis. Other neck: No other acute finding within the neck. Upper chest: No acute finding.  Review of the MIP images confirms the above findings CTA HEAD FINDINGS Anterior circulation: Scattered atheromatous plaque throughout the carotid siphons. Associated severe stenosis at the left ICA terminus, stable. Left A1 hypoplastic. Dominant right A1 patent. Lobulated right carotid siphon aneurysm measures 8 x 5 mm, not significantly changed. 3 mm aneurysm extending from the right carotid terminus, also stable. 1 mm anterior communicating artery aneurysm, also stable. Atheromatous irregularity about both ACAs without interval occlusion. Diffuse up to moderate approximate 50% stenosis throughout the left M1 segment. Moderate 40-50% proximal right M1 stenosis. Extensive atheromatous change throughout the MCA branches without interval proximal branch occlusion, stable. Posterior circulation: Left vertebral artery essentially occluded within the cranial vault. Heavily diseased right V4 segment which occludes prior to the vertebrobasilar junction. Right PICA remains perfused. Short-segment filling of the proximal basilar with subsequent reocclusion. Attenuated and diminutive filling of the basilar distally with perfusion of the SCA is. Predominant fetal type origin of the PCAs bilaterally. Extensive atheromatous disease throughout both PCAs with associated moderate right P2 stenosis. A severe distal left P2 stenosis appears mildly progressed (series 9, image 132). PCAs remain patent to their distal aspects. Venous sinuses: Grossly patent allowing for arterial timing the contrast bolus. Anatomic variants: As above. Review of the MIP images confirms the above findings IMPRESSION: 1. Overall, relatively similar CTA of the head and neck as compared to 04/19/2021. 2. Severe intracranial atherosclerotic disease throughout the posterior circulation with occlusion of the left vertebral artery and intermittent occlusion of the basilar artery. Heavily diseased but patent right vertebral artery. Atheromatous disease throughout  both PCAs with moderate right P2 stenosis. A severe distal left P2 stenosis appears mildly progressed from prior. 3. Atheromatous disease involving the anterior circulation with severe high-grade stenosis at the left ICA terminus, with moderate diffuse narrowing/stenoses involving the left greater than right M1 segments. 4. Extensive atheromatous change involving the proximal right greater than left subclavian arteries with superimposed penetrating atheromatous ulcers on the left. Extensive atheromatous disease throughout both vertebral arteries with associated moderate to severe multifocal V2/V3 stenoses, right worse than left. 5. Tortuosity with atheromatous disease involving both carotid artery systems within the neck, but no hemodynamically significant stenosis. 6. 8 x 5 mm lobulated aneurysm arising from the right carotid siphon, 2 mm aneurysm at the right carotid terminus, with 1 mm aneurysm at the anterior communicating artery complex, relatively stable from prior. Electronically Signed   By: Rise Mu M.D.   On: 07/05/2021 03:41  ? ?CT HEAD WO CONTRAST ( ) ? ?Result Date: 07/04/2021 ?CLINICAL DATA:  Slurred speech EXAM: CT HEAD WITHOUT CONTRAST TECHNIQUE: Contiguous axial images were obtained from the base of the skull through the vertex without intravenous contrast. RADIATION DOSE REDUCTION: This exam was performed according to the departmental dose-optimization program which includes automated exposure control, adjustment of the mA and/or kV according to patient size and/or use of iterative reconstruction technique. COMPARISON:  CT 06/04/2021, 04/19/2021 FINDINGS: Brain: No acute territorial infarction, hemorrhage or intracranial mass. Atrophy and chronic small vessel ischemic changes of the white matter. Multiple small chronic left greater than right cerebellar infarcts. Small chronic left occipital infarct. Chronic lacunar infarcts within the basal ganglia and left white matter. Stable  ventricle size. Vascular: No hyperdense vessels. Vertebral and carotid vascular calcification Skull: Normal.  Negative for fracture or focal lesion. Sinuses/Orbits: No acute finding. Other: None IMPRESSION: 1. No de

## 2021-07-05 NOTE — Evaluation (Signed)
Speech Language Pathology Evaluation ?Patient Details ?Name: [name] ?MRN: [id number] ?DOB: 1940-08-01 ?Today's Date: 07/05/2021 ?Time: 1025-1100 ?SLP Time Calculation (min) (ACUTE ONLY): 35 min ? ?Problem List:  ?Patient Active Problem List  ? Diagnosis Date Noted  ? Esophagitis with gastritis 07/05/2021  ? Ischemic stroke ([place])   ? Acute multifocal cerebral infarction, left hemisphere [facility]) 07/04/2021  ? 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 Carotid stenosis, non-symptomatic, right 05/07/2021  ? Incisional infection 12/25/2020  ? Visit for suture removal 12/25/2020  ? History of stroke with residual effects 05/15/2020  ? Heat exhaustion 11/04/2019  ? Chronic kidney disease, stage 3a ([place]) 10/28/2019  ? Upper GI bleed 10/28/2019  ? Hiatal hernia 10/28/2019  ? Essential hypertension 10/28/2019  ? Depression, major, single episode, severe ([place]) 10/14/2019  ? BMI 32.0-32.9,adult 10/01/2019  ? PVD (peripheral vascular disease) ([place]) 10/01/2019  ? Pain in right knee 10/01/2019  ? Benign paroxysmal vertigo, unspecified ear 06/07/2019  ? Primary insomnia 05/25/2019  ? 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 Acute embolic stroke ([place])   ? Stroke (cerebrum) ([place]) 06/10/2015  ? Mixed hyperlipidemia 02/13/2015  ? Mild aortic stenosis 08/13/2013  ? LBBB (left bundle branch block) 07/12/2011  ? Carotid bruit 07/12/2011  ? Chest pain 07/12/2011  ? Murmur 07/12/2011  ? ?Past Medical History:  ?Past Medical History:  ?Diagnosis Date  ? Benign paroxysmal vertigo, unspecified ear 06/07/2019  ? CKD (chronic kidney disease), stage III ([place])   ? GERD (gastroesophageal reflux disease)   ? Glaucoma   ? ?Past Surgical History:  ?Past Surgical History:  ?Procedure Laterality Date  ? ABDOMINAL HYSTERECTOMY    ? BACK SURGERY    ? cataract surgery    ? COLONOSCOPY N/A 08/03/2015  ? Procedure: COLONOSCOPY;  Surgeon: Laurence Spates, MD;  Location[facility] ENDOSCOPY;  Service: Endoscopy;  Laterality: N/A;  ? ESOPHAGOGASTRODUODENOSCOPY  N/A 07/31/2015  ? Procedure: ESOPHAGOGASTRODUODENOSCOPY (EGD);  Surgeon: Laurence Spates, MD;  Location: [facility] ENDOSCOPY;  Service: Endoscopy;  Laterality: N/A;  ? IR ANGIO INTRA EXTRACRAN SEL COM CAROTID INNOMINATE BILAT MOD SED  06/01/2021  ? IR ANGIO VERTEBRAL SEL SUBCLAVIAN INNOMINATE UNI L MOD SED  06/01/2021  ? IR RADIOLOGIST EVAL & MGMT  04/26/2021  ? OOPHORECTOMY    ? TOTAL HIP ARTHROPLASTY    ? URETER SURGERY    ? ?HPI:  ?81yo female admitted 07/04/21 with slurred speech and right facial weakness. PMH: occipital CVA, carotid artery aneurysms, BPPV, CKD3a, HTN, GERD, vertigo, glaucoma. MRI = multifocal acute ischemic nonhemorrhagic infarct involving the left cerebral hemisphere with additional infarcts in right occipital lobe and right PCA distribution  ? ?Assessment / Plan / Recommendation ?Clinical Impression ? The [place] Mental Status (SLUMS) Examination was administered. Pt scored 7/30, indicating significant neurocognitived impairments. Pt exhibited difficulty with orientation to year and month, immediate and delayed recall, mental math (pt has 10th grade education), thought organization (named 3 animals in 1 minute), digit reversal, clock drawing, and auditory attention and recall. Mild right facial weakness is noted, however, pt's speech is fully intelligible. Receptive and Expressive language skills appear WFL, although she did have difficulty with complex/abstract yes/no questions. This may be more related to confusion and decreased attention than receptive language skills. She was able to follow multi-step verbal directions. Pt was able to complete automatic sequences, repeat phrases, name objects, and engage in simple conversation.  Recommend 24 hour supervision at discharge given significant cognitive deficits, to maximize safety and independence. Follow up at  discharge with home health or outpatient speech therapy services is also recommended. ?   ?SLP Assessment ? SLP  Recommendation/Assessment: All further Speech Language Pathology needs can be addressed in the next venue of care - home health or outpatient unless pt goes to inpatient rehab ? ?SLP Visit Diagnosis: Cognitive communication deficit (R41.841)  ?  ?Recommendations for follow up therapy are one component of a multi-disciplinary discharge planning process, led by the attending physician.  Recommendations may be updated based on patient status, additional functional criteria and insurance authorization. ?   ?Follow Up Recommendations ? Follow physician's recommendations for discharge plan and follow up therapies  ?  ?Assistance Recommended at Discharge ? Frequent or constant Supervision/Assistance  ?Functional Status Assessment Patient has had a recent decline in their functional status and/or demonstrates limited ability to make significant improvements in function in a reasonable and predictable amount of time  ?   ?SLP Evaluation ?Cognition ? Overall Cognitive Status: History of cognitive impairments - at baseline ?Arousal/Alertness: Awake/alert ?Orientation Level: Oriented to person;Disoriented to time;Oriented to place;Disoriented to situation ?Year: 2022 ?Month: June ?Day of Week: Correct ?Memory: Impaired  ?  ?   ?Comprehension ? Auditory Comprehension ?Overall Auditory Comprehension: Appears within functional limits for tasks assessed  ?  ?Expression Expression ?Primary Mode of Expression: Verbal ?Verbal Expression ?Overall Verbal Expression: Appears within functional limits for tasks assessed ?Written Expression ?Dominant Hand: Right   ?Oral / Motor ? Oral Motor/Sensory Function ?Overall Oral Motor/Sensory Function: Mild impairment ?Facial ROM: Reduced right ?Facial Symmetry: Abnormal symmetry right ?Facial Strength: Within Functional Limits ?Facial Sensation: Within Functional Limits ?Lingual ROM: Within Functional Limits ?Lingual Symmetry: Within Functional Limits ?Lingual Strength: Within Functional  Limits ?Lingual Sensation: Within Functional Limits ?Mandible: Within Functional Limits ?Motor Speech ?Overall Motor Speech: Appears within functional limits for tasks assessed ?Intelligibility: Intelligible   ?        ?Laurinda Carreno B. Jahnay Lantier, MSP, CCC-SLP ?Speech Language Pathologist ?Office: [phone] ? ?Shonna Chock ?07/05/2021, 11:18 AM ? ? ?

## 2021-07-06 ENCOUNTER — Telehealth: Payer: Self-pay

## 2021-07-06 ENCOUNTER — Other Ambulatory Visit (HOSPITAL_COMMUNITY): Payer: Self-pay

## 2021-07-06 DIAGNOSIS — R69 Illness, unspecified: Secondary | ICD-10-CM | POA: Diagnosis not present

## 2021-07-06 DIAGNOSIS — K2970 Gastritis, unspecified, without bleeding: Secondary | ICD-10-CM | POA: Diagnosis not present

## 2021-07-06 DIAGNOSIS — I639 Cerebral infarction, unspecified: Secondary | ICD-10-CM | POA: Diagnosis not present

## 2021-07-06 DIAGNOSIS — F322 Major depressive disorder, single episode, severe without psychotic features: Secondary | ICD-10-CM | POA: Diagnosis not present

## 2021-07-06 DIAGNOSIS — I10 Essential (primary) hypertension: Secondary | ICD-10-CM | POA: Diagnosis not present

## 2021-07-06 DIAGNOSIS — K209 Esophagitis, unspecified without bleeding: Secondary | ICD-10-CM | POA: Diagnosis not present

## 2021-07-06 DIAGNOSIS — E782 Mixed hyperlipidemia: Secondary | ICD-10-CM | POA: Diagnosis not present

## 2021-07-06 DIAGNOSIS — N1831 Chronic kidney disease, stage 3a: Secondary | ICD-10-CM | POA: Diagnosis not present

## 2021-07-06 LAB — CBC
HCT: 32.2 % — ABNORMAL LOW (ref 36.0–46.0)
Hemoglobin: 10.8 g/dL — ABNORMAL LOW (ref 12.0–15.0)
MCH: 30.9 pg (ref 26.0–34.0)
MCHC: 33.5 g/dL (ref 30.0–36.0)
MCV: 92 fL (ref 80.0–100.0)
Platelets: 242 10*3/uL (ref 150–400)
RBC: 3.5 MIL/uL — ABNORMAL LOW (ref 3.87–5.11)
RDW: 12.8 % (ref 11.5–15.5)
WBC: 4.8 10*3/uL (ref 4.0–10.5)
nRBC: 0 % (ref 0.0–0.2)

## 2021-07-06 LAB — BASIC METABOLIC PANEL
Anion gap: 3 — ABNORMAL LOW (ref 5–15)
BUN: 15 mg/dL (ref 8–23)
CO2: 23 mmol/L (ref 22–32)
Calcium: 9.2 mg/dL (ref 8.9–10.3)
Chloride: 111 mmol/L (ref 98–111)
Creatinine, Ser: 0.93 mg/dL (ref 0.44–1.00)
GFR, Estimated: >60 mL/min (ref >60)
Glucose, Bld: 98 mg/dL (ref 70–99)
Potassium: 3.5 mmol/L (ref 3.5–5.1)
Sodium: 137 mmol/L (ref 135–145)

## 2021-07-06 LAB — MAGNESIUM: Magnesium: 1.8 mg/dL (ref 1.7–2.4)

## 2021-07-06 MED ORDER — ASPIRIN 81 MG PO TBEC: 81.0000 mg | DELAYED_RELEASE_TABLET | Freq: Every day | ORAL | 0 refills | Status: DC

## 2021-07-06 MED ORDER — ASPIRIN 81 MG PO TBEC
81.0000 mg | DELAYED_RELEASE_TABLET | Freq: Every day | ORAL | 11 refills | Status: DC
  Filled 2021-07-06: qty 30, 30d supply, fill #0

## 2021-07-06 MED ORDER — TICAGRELOR 90 MG PO TABS
90.0000 mg | ORAL_TABLET | Freq: Two times a day (BID) | ORAL | 1 refills | Status: DC
  Filled 2021-07-06: qty 60, 30d supply, fill #0

## 2021-07-06 MED ORDER — FAMOTIDINE 20 MG PO TABS
20.0000 mg | ORAL_TABLET | Freq: Two times a day (BID) | ORAL | 1 refills | Status: DC
  Filled 2021-07-06: qty 60, 30d supply, fill #0

## 2021-07-06 MED ORDER — VALSARTAN 160 MG PO TABS: 160.0000 mg | ORAL_TABLET | Freq: Every evening | ORAL | Status: DC

## 2021-07-06 MED ORDER — POTASSIUM CHLORIDE CRYS ER 20 MEQ PO TBCR
40.0000 meq | EXTENDED_RELEASE_TABLET | Freq: Once | ORAL | Status: AC
  Administered 2021-07-06: 40 meq via ORAL
  Filled 2021-07-06: qty 2

## 2021-07-06 MED ORDER — TICAGRELOR 90 MG PO TABS: 90.0000 mg | ORAL_TABLET | Freq: Two times a day (BID) | ORAL | Status: DC

## 2021-07-06 MED ORDER — TICAGRELOR 90 MG PO TABS
90.0000 mg | ORAL_TABLET | Freq: Two times a day (BID) | ORAL | 2 refills | Status: DC
  Filled 2021-07-06: qty 60, 30d supply, fill #0

## 2021-07-06 MED ORDER — MAGNESIUM SULFATE 2 GM/50ML IV SOLN
2.0000 g | Freq: Once | INTRAVENOUS | Status: AC
  Administered 2021-07-06: 2 g via INTRAVENOUS
  Filled 2021-07-06: qty 50

## 2021-07-06 NOTE — Telephone Encounter (Signed)
How have you been since you were released from the hospital? ? ?Do you understand why you were in the hospital? ? ?Do you understanding the discharge instructions? ? ?Items reviewed: ?Medications: ?Allergies: ?Dietary changes: ?Referrals reviewed: ? ?Functional questionare: ?ADLs (independent/dependent) ? Ambulation? ? Bathing/hygiene? ? Feeding/Food prep? ? Continence? ? Grooming/dressing? ? ?Transportation ?Any patient concerns? ? ?Confirmed appointments and set up follow up with PCP: ? ?Confirmed with patient if condition begins to worsen call PCP or go to the ER. Pt. Was given the office number and encouraged to call back with questions or concerns.   ?

## 2021-07-06 NOTE — Evaluation (Signed)
Occupational Therapy Evaluation ?Patient Details ?Name: [name] ?MRN: [id number] ?DOB: Feb 10, 1941 ?Today's Date: 07/06/2021 ? ? ?History of Present Illness 81 y.o. female presents to [facility] hospital on 07/04/2021 with slurred speech and R facial weakness. Pt recently found to have intracranial aneurysms of head and neck and severe vertebrobasilar arteriosclerotic disease. MRI brain 4/12 shows multifocal acute ischemic nonhemorrhagic infarct involving the left cerebral hemisphere with additional infarcts in right supra lobe and right PCA distribution. PMH includes BPPV, CKD, GERD, glaucoma.  ? ?Clinical Impression ?  ?Pt admitted for concerns listed above. PTA pt reported that she was requiring min assist with bathing/shower transfers, and at times min A for functional mobility due to falls. At this time, pt presents with increased weakness, balance concerns, and decreased activity tolerance. She is heavily reliant on the RW and dynamic standing balance, pt requires support. Recommending HHOT To maximize strength and safety at home. OT will follow acutely.   ?   ? ?Recommendations for follow up therapy are one component of a multi-disciplinary discharge planning process, led by the attending physician.  Recommendations may be updated based on patient status, additional functional criteria and insurance authorization.  ? ?Follow Up Recommendations ? Home health OT  ?  ?Assistance Recommended at Discharge Intermittent Supervision/Assistance  ?Patient can return home with the following A little help with walking and/or transfers;A little help with bathing/dressing/bathroom;Assistance with cooking/housework;Direct supervision/assist for medications management;Direct supervision/assist for financial management;Assist for transportation;Help with stairs or ramp for entrance ? ?  ?Functional Status Assessment ? Patient has had a recent decline in their functional status and demonstrates the ability to make significant  improvements in function in a reasonable and predictable amount of time.  ?Equipment Recommendations ? None recommended by OT  ?  ?Recommendations for Other Services   ? ? ?  ?Precautions / Restrictions Precautions ?Precautions: Fall ?Restrictions ?Weight Bearing Restrictions: No  ? ?  ? ?Mobility Bed Mobility ?  ?  ?  ?  ?  ?  ?  ?General bed mobility comments: Up in recliner ?  ? ?Transfers ?Overall transfer level: Needs assistance ?Equipment used: Rolling walker (2 wheels) ?Transfers: Sit to/from Stand ?Sit to Stand: Min guard ?  ?  ?  ?  ?  ?General transfer comment: Min guard for safety, pt a little wobbly, family reports this has been going on for a few weeks now ?  ? ?  ?Balance Overall balance assessment: Needs assistance ?Sitting-balance support: No upper extremity supported, Feet supported ?Sitting balance-Leahy Scale: Good ?  ?  ?Standing balance support: Single extremity supported, Bilateral upper extremity supported, Reliant on assistive device for balance ?Standing balance-Leahy Scale: Poor ?Standing balance comment: Pt has had multiple falls recently ?  ?  ?  ?  ?  ?  ?  ?  ?  ?  ?  ?   ? ?ADL either performed or assessed with clinical judgement  ? ?ADL Overall ADL's : At baseline ?  ?  ?  ?  ?  ?  ?  ?  ?  ?  ?  ?  ?  ?  ?  ?  ?  ?  ?  ?General ADL Comments: Pt is overall at her baseline, requiring supervision to min assist for BADL's and functional mobility  ? ? ? ?Vision Baseline Vision/History: 1 Wears glasses;6 Macular Degeneration ?Ability to See in Adequate Light: 1 Impaired ?Patient Visual Report: No change from baseline ?Additional Comments: Pt has visual impairments at baseline,  especially with the R visual field.  ?   ?Perception   ?  ?Praxis   ?  ? ?Pertinent Vitals/Pain Pain Assessment ?Pain Assessment: No/denies pain  ? ? ? ?Hand Dominance Right ?  ?Extremity/Trunk Assessment Upper Extremity Assessment ?Upper Extremity Assessment: Overall WFL for tasks assessed ?  ?Lower Extremity  Assessment ?Lower Extremity Assessment: Defer to PT evaluation ?  ?Cervical / Trunk Assessment ?Cervical / Trunk Assessment: Kyphotic ?  ?Communication Communication ?Communication: No difficulties ?  ?Cognition Arousal/Alertness: Awake/alert ?Behavior During Therapy: [facility] for tasks assessed/performed ?Overall Cognitive Status: History of cognitive impairments - at baseline ?  ?  ?  ?  ?  ?  ?  ?  ?  ?  ?  ?  ?  ?  ?  ?  ?General Comments: short term memory impairment ?  ?  ?General Comments  VSS on RA ? ?  ?Exercises   ?  ?Shoulder Instructions    ? ? ?Home Living Family/patient expects to be discharged to:: Private residence ?Living Arrangements: Spouse/significant other ?Available Help at Discharge: Family;Available PRN/intermittently ?Type of Home: House ?Home Access: Stairs to enter ?Entrance Stairs-Number of Steps: 2 ?Entrance Stairs-Rails: Right ?Home Layout: One level ?  ?  ?Bathroom Shower/Tub: Walk-in shower ?  ?Bathroom Toilet: Handicapped height ?Bathroom Accessibility: Yes ?How Accessible: Accessible via walker ?Home Equipment: Agricultural consultant (2 wheels);Cane - single Doctor, hospital (4 wheels) ?  ?  ?  ? ?  ?Prior Functioning/Environment Prior Level of Function : Needs assist ?  ?  ?  ?  ?  ?  ?Mobility Comments: pt has been woring with HHPT on ambulating with RW since recent CVAs ?ADLs Comments: family assist with bathing and IADLs since recent CVAs ?  ? ?  ?  ?OT Problem List: Decreased strength;Decreased activity tolerance;Impaired balance (sitting and/or standing);Decreased cognition;Decreased safety awareness ?  ?   ?OT Treatment/Interventions: Therapeutic exercise;Self-care/ADL training;DME and/or AE instruction;Energy conservation;Therapeutic activities;Patient/family education;Balance training;Cognitive remediation/compensation  ?  ?OT Goals(Current goals can be found in the care plan section) Acute Rehab OT Goals ?Patient Stated Goal: To go home ?OT Goal Formulation: With  patient ?Time For Goal Achievement: 07/20/21 ?Potential to Achieve Goals: Good ?ADL Goals ?Pt Will Perform Grooming: with modified independence;standing ?Pt Will Perform Lower Body Bathing: with supervision;sitting/lateral leans;sit to/from stand ?Pt Will Perform Lower Body Dressing: with supervision;sitting/lateral leans;sit to/from stand ?Pt Will Transfer to Toilet: with supervision;ambulating ?Pt Will Perform Toileting - Clothing Manipulation and hygiene: with supervision;sitting/lateral leans;sit to/from stand  ?OT Frequency: Min 2X/week ?  ? ?Co-evaluation   ?  ?  ?  ?  ? ?  ?AM-PAC OT "6 Clicks" Daily Activity     ?Outcome Measure Help from another person eating meals?: None ?Help from another person taking care of personal grooming?: A Little ?Help from another person toileting, which includes using toliet, bedpan, or urinal?: A Little ?Help from another person bathing (including washing, rinsing, drying)?: A Lot ?Help from another person to put on and taking off regular upper body clothing?: A Little ?Help from another person to put on and taking off regular lower body clothing?: A Little ?6 Click Score: 18 ?  ?End of Session Equipment Utilized During Treatment: Gait belt;Rolling walker (2 wheels) ?Nurse Communication: Mobility status ? ?Activity Tolerance: Patient tolerated treatment well ?Patient left: in chair;with call bell/phone within reach;with family/visitor present ? ?OT Visit Diagnosis: Unsteadiness on feet (R26.81);Other abnormalities of gait and mobility (R26.89);Muscle weakness (generalized) (M62.81)  ?              ?  Time: 2334-3568 ?OT Time Calculation (min): 26 min ?Charges:  OT General Charges ?$OT Visit: 1 Visit ?OT Evaluation ?$OT Eval Moderate Complexity: 1 Mod ?OT Treatments ?$Self Care/Home Management : 8-22 mins ? ?Odelle Kosier H., OTR/L ?Acute Rehabilitation ? ?Shyloh Krinke Elane Bing Plume ?07/06/2021, 4:38 PM ?

## 2021-07-06 NOTE — TOC Benefit Eligibility Note (Signed)
Patient Advocate Encounter ? ?Insurance verification completed.   ? ?The patient is currently admitted and upon discharge could be taking Brilinta 60 mg. ? ?The current 30 day co-pay is, $100.00.  ? ?The patient is currently admitted and upon discharge could be taking Brilinta 90 mg. ? ?The current 30 day co-pay is, $100.00.  ? ?The patient is insured through [employer] Part D  ? ? ? ?Roland Earl, CPhT ?Pharmacy Patient Advocate Specialist ?[facility] Patient Advocate Team ?Direct Number: [phone]  Fax: [phone] ? ? ? ? ? ?  ?

## 2021-07-06 NOTE — Telephone Encounter (Signed)
-----   Message from Drema Dallas, DO sent at 07/06/2021  2:52 PM EDT ----- ?Transition of care.  Patient discharged. ? ?

## 2021-07-06 NOTE — Discharge Summary (Signed)
Physician Discharge Summary  ?[name] SWH:[id number] DOB: 1940-06-03 DOA: 07/04/2021 ? ?PCP: [name], MD ? ?Admit date: 07/04/2021 ?Discharge date: 07/06/2021 ? ?Time spent: 60 minutes ? ?Recommendations for Outpatient Follow-up:  ?Patient was discharged home with home health. ?Follow-up with Dr. Everlena Cooper, neurology in 4 weeks. ?Follow-up with [name], MD in 2 weeks.  On follow-up patient will need a basic metabolic profile done to follow-up on electrolytes and renal function.  Patient also need risk factor modification. ? ? ?Discharge Diagnoses:  ?Principal Problem: ?  Acute multifocal cerebral infarction, left hemisphere [facility]) ?Active Problems: ?  Essential hypertension ?  Mixed hyperlipidemia ?  Chronic kidney disease, stage 3a (HCC) ?  Depression, major, single episode, severe (HCC) ?  Esophagitis with gastritis ?  Essential (primary) hypertension ? ? ?Discharge Condition: Stable and improved ? ?Diet recommendation: Heart healthy ? ?Filed Weights  ? 07/05/21 2059  ?Weight: 71.3 kg  ? ? ?History of present illness:  ?HPI per Dr. Allena Katz ?HEVIN HOUSEAL is a 81 y.o. female with medical history significant for history of occipital CVA, vertebral artery stenosis with occlusion, carotid artery aneurysms, CKD stage IIIa, LBBB, hypertension who presented to the ED for evaluation of slurred speech and right facial weakness.  History is supplemented by patient's daughter at bedside. ? 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  She underwent angiography with IR on 3/10, results under imaging.  She went to the ED on 3/13 for right leg pain/heaviness.  She was found to have a large right anterior thigh bruise.  CT head without contrast was obtained and was negative for acute intracranial process.  Multiple old infarcts were noted.  Venous and arterial ultrasounds were obtained and reassuring.  Case was discussed  with IR who recommended patient may continue Plavix.  Patient was discharged to home. ? ?She was seen by her PCP in outpatient setting on 3/17 for new dysarthria and confusional state.  MRI head was ordered and completed on 3/23 which showed subacute infarction in the segmental and left parietal lobes. ? ?Evening of 07/04/2021 while eating supper, patient's daughter noticed that she had difficulty chewing with the right side of her mouth.  Afterwards patient's other daughter noted that she had significant dysarthria and difficulty with speech.  This was transient.  Patient also reports worsening right leg weakness compared to her recent baseline.  She has recently begun working with PT/OT at home. ?  ?ED Course  Labs/Imaging on admission: I have personally reviewed following labs and imaging studies. ?  ?Initial vitals showed BP 114/99, pulse 68, RR 20, temp 98.4 ?F, SPO2 97% on room air. ?  ?Labs show WBC 5.7, hemoglobin 12.2, platelets 278,000, sodium 140, potassium 3.5, bicarb 20, BUN 19, creatinine 0.98, serum glucose 99. ?  ?CT head without contrast negative for acute intracranial abnormality.  Atrophy and chronic small vessel ischemic changes of the white matter seen, multiple chronic infarcts involving the white matter, basal ganglia, left supra lobe, and cerebellum also seen. ?  ?MRI brain without contrast shows patchy multifocal acute ischemic nonhemorrhagic infarct involving the left cerebral hemisphere in watershed distribution with additional infarcts involving the right supra lobe and right PCA distribution. ?  ?MRA head shows severe intracranial atherosclerotic disease throughout with occlusion of the left vertebral artery and intermittent near complete occlusion of the basilar artery.  Heavily diseased but patent right vertebral artery seen.  Severe high-grade stenosis at  left ICA terminus seen.  Right carotid artery aneurysms also seen.  These changes similar compared to recent CTA 04/19/2021. ?  ?MRA  neck shows extensive atheromatous change involving proximal right greater than left subclavian arteries with superimposed penetrating atheromatous ulcers on the left.  Heavy atheromatous disease throughout both vertebral arteries moderate to severe multifocal stenosis seen.  Tortuosity with atheromatous disease involving both carotid artery systems within the neck seen without hemodynamically significant stenosis.  Changes similar compared to recent CTA. ?  ?EDP discussed with on-call neurology who recommended admission for further stroke work-up.  The hospitalist service was consulted to admit for further evaluation and management. ?  ?Hospital Course:  ? ?Assessment and Plan: ?* Acute multifocal cerebral infarction, left hemisphere [facility]) ?Right occipital lobe infarcts ?Admitted with multifocal embolic appearing infarcts in posterior and left MCA distribution.  Has significant atherosclerotic disease of head and neck as well as intracranial aneurysms and severe vertebrobasilar arteriosclerotic disease. 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  Atheromatous disease involving anterior circulation with severe high-grade stenosis at the left ICA terminus, moderate diffuse narrowing/stenosis involving the left greater than right M1 segments. ?-2D echo with EF of 70 to 75%, NWMA, grade 1 diastolic dysfunction, no source of emboli noted. ?-May need TEE however not recommended per neurology. ?-Patient maintained on aspirin and Plavix early on during the hospitalization.   ?-Patient seen by PT/OT/SLP who recommended home health therapies. ?-Not on statin due to intolerance, is on Praluent  q14 days which has been reordered. ?-Neurology recommended dual antiplatelet therapy of aspirin and Brilinta for 3 months followed by Brilinta alone. ?-Neurology offered [place] stroke prevention trial however family declined to participate in it. ?-Outpatient follow-up with primary neurologist, Dr. Everlena Cooper in 4 weeks. ? ?Essential hypertension ?-On presentation patient's antihypertensive medications were held to allow permissive hypertension.   ?-Patient gently hydrated with IV fluids.   ?-Patient's home regimen amlodipine was resumed.   ?-Patient's home regimen of valsartan will be resumed 1 to 2 days post discharge.   ?-Outpatient follow-up with PCP. ? ?Mixed hyperlipidemia ?Not on statin due to intolerance.   ?-Prescribed Praluent every 14 days. ? ?Esophagitis with gastritis ?- Patient with history of severe esophagitis and gastritis. ?-Per daughter was on a PPI however discontinued due to decreased vitamin B12 levels. ?-Placed on Pepcid 20 mg twice daily during the hospitalization which patient will be discharged home on. ?-Outpatient follow-up with PCP. ? ?Depression, major, single episode, severe (HCC) ?- Patient was maintained on home regimen of Trintellix. ? ?Chronic kidney disease, stage 3a (HCC) ?-Remained stable during the hospitalization. ? ? ? ? ?  ? ?Procedures: ?CT head 07/04/2021 ?CT angiogram head and neck 07/05/2021 ?MRI brain 07/04/2021 ?MRA head and neck 07/04/2021 ?2D echo 07/05/2021 ? ?Consultations: ?Neurology: Dr. Amada Jupiter 07/05/2021 ? ?Discharge Exam: ?Vitals:  ? 07/06/21 0325 07/06/21 1103  ?BP: (!) 164/68 (!) 168/59  ?Pulse: 70 69  ?Resp:  18  ?Temp: 98.4 ?F (36.9 ?C) 98.9 ?F (37.2 ?C)  ?SpO2: 96% 97%  ? ? ?General: NAD.  Improving dysarthria. ?Cardiovascular: RRR no murmurs rubs or gallops.  No JVD.  No lower extremity edema. ?Respiratory: Clear to auscultation bilaterally.  No wheezes, no  crackles, no rhonchi. ? ?Discharge Instructions ? ? ?Discharge Instructions   ? ? Ambulatory referral to  Neurology   Complete by: As directed ?  ? An appointment is requested in approximately: 4 weeks  ? Diet - low sodium heart healthy   Complete by: As directed ?  ? Increase activity slowly   Complete

## 2021-07-06 NOTE — Progress Notes (Signed)
STROKE TEAM PROGRESS NOTE  ? ?INTERVAL HISTORY ?Patient's granddaughter and daughter at the bedside.  Speech continues to improve.  She has no new complaints today.  Therapy is recommending rehab but family wants to take her home.  Patient and family have decided not to participate in the [place] stroke prevention study. ? ?Vitals:  ? 07/05/21 2059 07/05/21 2334 07/06/21 0325 07/06/21 1103  ?BP: (!) 152/61 (!) 174/68 (!) 164/68 (!) 168/59  ?Pulse: 70 77 70 69  ?Resp:    18  ?Temp:  98.3 ?F (36.8 ?C) 98.4 ?F (36.9 ?C) 98.9 ?F (37.2 ?C)  ?TempSrc:  Oral Oral Oral  ?SpO2:  93% 96% 97%  ?Weight: 71.3 kg     ?Height: 5' (1.524 m)     ? ?CBC:  ?Recent Labs  ?Lab 07/04/21 ?2049 07/05/21 ?[id number] 07/06/21 ?[id number]  ?WBC 5.7 5.3 4.8  ?NEUTROABS 2.8  --   --   ?HGB 12.2 11.0* 10.8*  ?HCT 38.6 34.0* 32.2*  ?MCV 96.7 94.4 92.0  ?PLT 278 249 242  ? ?Basic Metabolic Panel:  ?Recent Labs  ?Lab 07/05/21 ?[id number] 07/06/21 ?[id number]  ?NA 138 137  ?K 3.6 3.5  ?CL 111 111  ?CO2 23 23  ?GLUCOSE 99 98  ?BUN 16 15  ?CREATININE 0.90 0.93  ?CALCIUM 9.6 9.2  ?MG  --  1.8  ? ?Lipid Panel:  ?Recent Labs  ?Lab 07/05/21 ?[id number]  ?CHOL 156  ?TRIG 212*  ?HDL 36*  ?CHOLHDL 4.3  ?VLDL 42*  ?[place] 78  ? ?HgbA1c:  ?Recent Labs  ?Lab 07/05/21 ?[id number]  ?HGBA1C 5.0  ? ?Urine Drug Screen: No results for input(s): LABOPIA, COCAINSCRNUR, LABBENZ, AMPHETMU, THCU, LABBARB in the last 168 hours.  ?Alcohol Level No results for input(s): ETH in the last 168 hours. ? ?IMAGING past 24 hours ?No results found. ? ?PHYSICAL EXAM ? ?Physical Exam  ?Constitutional: Appears well-developed and well-nourished.  ?Psych: Affect appropriate to situation ?Eyes: No scleral injection ?HENT: No OP obstrucion ?MSK: no joint deformities.  ?Cardiovascular: Normal rate and regular rhythm.  ?Respiratory: Effort normal, non-labored breathing ?GI: Soft.  No distension. There is no tenderness.  ?Skin: WDI ? ?Neuro: ?Mental Status: ?Patient is awake, alert and oriented but is unable to describe the  events leading to her hospitalization.diminished attention, registration and recall. SLUMS score 7/30 ?Speech: see SLP note ?Cranial Nerves: ?II: Poor vision bilaterally due to macular degeneration. pupils are equal, round, and reactive to light.  ?III,IV, VI: EOMI without ptosis or diploplia.  ?V: Facial sensation is symmetric to light touch ?VII: Facial movement is symmetric resting and smiling ?VIII: Hearing is intact to voice ?X: Palate elevates symmetrically ?XI: Shoulder shrug is symmetric. ?XII: Tongue protrudes midline without atrophy or fasciculations.  ?Motor: ?Tone is normal. Bulk is normal. 5/5 strength was present in all four extremities. Mild RLE drift and hip flexion weakness ?Sensory: ?Sensation is symmetric to light touch in the arms and legs. ?Cerebellar: ?FNF intact bilaterally ? ?  ? ?ASSESSMENT/PLAN ?[name] is an 81 year old female with a PMHx of bilateral brainstem strokes thought to be 2/2 to severe atheromatous posterior circulation disease. She underwent a CT angiogram in January showing progression of significant bilateral vertebral stenosis as well as multiple aneurysms. She was then evaluated with an cerebral arteriogram by Dr. Estanislado Pandy on 3/10, with no intervention taken. The patient's daughter reports noticing new aphasia, slurred speech, and facial droop and brought the patient in for evaluation. Per previous note by Dr. Leonel Ramsay, the patient has  had some speech difficulties ongoing for several days. MRI notable for patchy left hemispheric infarcts and right PCA territory infarcts.   ? ?Stroke: patchy left hemispheric infarcts and right PCA territory infarcts., likely 2/2 to severe intracranial stenosis. ?CTH negative for acute anomaly ?MRI: patchy left hemispheric infarcts and right PCA territory infarcts ?MRA Head and Neck: diffuse atheromatous disease affecting extensive areas of the anterior and posterior circulation, see radiologist's report for full findings, along with  multiple aneurysms.  ?2D Echo EF 70-75%, G1DD, otherwise, unremarkable ?LDL 78 patient is on Praluent and has a history of statin intolerance ?HgbA1c 5.0 ?VTE prophylaxis - SCDs ?   ?Diet  ? Diet Heart Room service appropriate? Yes; Fluid consistency: Thin  ? ?clopidogrel 75 mg daily prior to admission, now on aspirin 81 mg daily and Brilinta 90 mg twice   daily. x 3 months and then Brilinta alone  ?Therapy recommendations:  HHPT, OT recs pending ?Disposition:  Home ? ?Hypertension ?Home meds:  Valsartan, Norvasc ?Unstable ?Permissive hypertension (OK if < 220/120) but gradually normalize in 5-7 days ?Long-term BP goal normotensive ? ?Hyperlipidemia ?Home meds:  NA, reported intolerance to statins continue Praluent in home dose ?LDL 78, goal < 70 ? ?Glucose control ?Home meds:  NA ?HgbA1c 5.0, goal < 7.0 ?CBGs ?No results for input(s): GLUCAP in the last 72 hours.  ?SSI ? ?Other Stroke Risk Factors ?Advanced Age >/= 46  ?ETOH use, advised to drink no more than 1 drink(s) a day ?Hx stroke/TIA ? ?Hospital day # 0 ? ? ?  ? She has known severe multivessel intracranial atheromatous disease and presented with new speech difficulties  and MRi shows left MCA branch infarcts likely from symptomatic severe left MCA stenosis. Recommend dual antiplatelet therapy of aspirin and Brilinta for 3 months followed by Brilinta alone.  Patient has $100 a month Brilinta but family has confirmed they can afford it.  And aggressive risk factor modification.  Patient and family have declined particpation in [place] stroke prevention trial .  Follow-up as an outpatient with her neurologist Dr. Tomi Likens.I have spent a total of 35   minutes with the patient reviewing hospital notes,  test results, labs and examining the patient as well as establishing an assessment and plan that was discussed personally with the patient.  > 50% of time was spent in direct patient care.D/w daughter and Dr Grandville Silos. ?  ?  ? ? ?Antony Contras, MD ?Medical  Director ?[facility] ?Pager: [phone] ?07/06/2021 2:27 PM ? ?To contact Stroke Continuity provider, please refer to [email or web address] ?After hours, contact General Neurology ? ?

## 2021-07-06 NOTE — Care Management Obs Status (Signed)
MEDICARE OBSERVATION STATUS NOTIFICATION ? ? ?Patient Details  ?Name: [name] ?MRN: [id number] ?Date of Birth: August 28, 1940 ? ? ?Medicare Observation Status Notification Given:  Yes ? ? ? ?Tom-Johnson, Hershal Coria, RN ?07/06/2021, 9:18 AM ?

## 2021-07-06 NOTE — TOC Transition Note (Signed)
Transition of Care (TOC) - CM/SW Discharge Note ? ? ?Patient Details  ?Name: REBECCA JACQUART ?MRN: [id number] ?Date of Birth: 06/29/1940 ? ?Transition of Care (TOC) CM/SW Contact:  ?Tom-Johnson, Renea Ee, RN ?Phone Number: ?07/06/2021, 3:10 PM ? ? ?Clinical Narrative:    ? ?Patient is scheduled for discharge today. Home health PT recommended. Patient's daughter, Santiago Glad states patientis currently active with [facility] for PT/OT disciplines and would like to continue with their services. Information on AVS.  ?Prescriptions sent to [facility] and 30 days coupon used for Brilinta. Medications to be delivered to patient at bedside.  ?Family at bedside and to transport at discharge. No further TOC needs noted. ? ?Final next level of care: [place] ?Barriers to Discharge: Barriers Resolved ? ? ?Patient Goals and CMS Choice ?Patient states their goals for this hospitalization and ongoing recovery are:: To return home ?CMS Medicare.gov Compare Post Acute Care list provided to:: Patient ?Choice offered to / list presented to : Patient, Adult Children Santiago Glad) ? ?Discharge Placement ?  ?           ?  ?Patient to be transferred to facility by: Daughter ?  ?  ? ?Discharge Plan and Services ?  ?  ?           ?DME Arranged: N/A ?DME Agency: NA ?  ?  ?  ?HH Arranged: PT, OT ?[place] Agency: [facility] ?  ?  ?  ? ?Social Determinants of Health (SDOH) Interventions ?  ? ? ?Readmission Risk Interventions ?   ? View : No data to display.  ?  ?  ?  ? ? ? ? ? ?

## 2021-07-09 ENCOUNTER — Telehealth: Payer: Self-pay

## 2021-07-09 NOTE — Telephone Encounter (Signed)
Attempted to reach patient's daughter, [name].  LM on her home voicemail.  No answer at patient's home. ?

## 2021-07-10 ENCOUNTER — Other Ambulatory Visit: Payer: Self-pay | Admitting: Legal Medicine

## 2021-07-10 ENCOUNTER — Ambulatory Visit: Payer: Medicare HMO

## 2021-07-10 DIAGNOSIS — K219 Gastro-esophageal reflux disease without esophagitis: Secondary | ICD-10-CM | POA: Diagnosis not present

## 2021-07-10 DIAGNOSIS — I2510 Atherosclerotic heart disease of native coronary artery without angina pectoris: Secondary | ICD-10-CM

## 2021-07-10 DIAGNOSIS — I350 Nonrheumatic aortic (valve) stenosis: Secondary | ICD-10-CM | POA: Diagnosis not present

## 2021-07-10 DIAGNOSIS — N183 Chronic kidney disease, stage 3 unspecified: Secondary | ICD-10-CM | POA: Diagnosis not present

## 2021-07-10 DIAGNOSIS — I10 Essential (primary) hypertension: Secondary | ICD-10-CM

## 2021-07-10 DIAGNOSIS — I739 Peripheral vascular disease, unspecified: Secondary | ICD-10-CM | POA: Diagnosis not present

## 2021-07-10 DIAGNOSIS — E782 Mixed hyperlipidemia: Secondary | ICD-10-CM | POA: Diagnosis not present

## 2021-07-10 DIAGNOSIS — H409 Unspecified glaucoma: Secondary | ICD-10-CM | POA: Diagnosis not present

## 2021-07-10 DIAGNOSIS — H8110 Benign paroxysmal vertigo, unspecified ear: Secondary | ICD-10-CM | POA: Diagnosis not present

## 2021-07-10 DIAGNOSIS — I69322 Dysarthria following cerebral infarction: Secondary | ICD-10-CM | POA: Diagnosis not present

## 2021-07-10 DIAGNOSIS — I6521 Occlusion and stenosis of right carotid artery: Secondary | ICD-10-CM

## 2021-07-10 DIAGNOSIS — I69393 Ataxia following cerebral infarction: Secondary | ICD-10-CM | POA: Diagnosis not present

## 2021-07-10 DIAGNOSIS — I129 Hypertensive chronic kidney disease with stage 1 through stage 4 chronic kidney disease, or unspecified chronic kidney disease: Secondary | ICD-10-CM | POA: Diagnosis not present

## 2021-07-10 MED ORDER — TICAGRELOR 90 MG PO TABS: 90.0000 mg | ORAL_TABLET | Freq: Two times a day (BID) | ORAL | 2 refills | Status: DC

## 2021-07-10 MED ORDER — FAMOTIDINE 20 MG PO TABS: 20.0000 mg | ORAL_TABLET | Freq: Every day | ORAL | 2 refills | Status: DC

## 2021-07-10 NOTE — Progress Notes (Signed)
? ?Chronic Care Management ?Pharmacy Note ? ?07/10/2021 ?Name:  [name] MRN:  [id number] DOB:  Oct 08, 1940  ? ?Plan Recommendations:  ?Repeat Vitamin D, hasn't gotten one in a year ?Please send refills of hospital Discharge meds of Ticagrelor/Famotidine to [facility] so CPP can synch meds in packs ?Patient has questions for PCP regarding ARB, PCP has f/u next week ? ?Subjective: ?[name] is an 81 y.o. year old female who is a primary patient of Henrene Pastor, Zeb Comfort, MD.  The CCM team was consulted for assistance with disease management and care coordination needs.   ? ?Engaged with patient by telephone for follow up visit in response to provider referral for pharmacy case management and/or care coordination services.  ? ?Consent to Services:  ?The patient was given the following information about Chronic Care Management services today, agreed to services, and gave verbal consent: 1. CCM service includes personalized support from designated clinical staff supervised by the primary care provider, including individualized plan of care and coordination with other care providers 2. 24/7 contact phone numbers for assistance for urgent and routine care needs. 3. Service will only be billed when office clinical staff spend 20 minutes or more in a month to coordinate care. 4. Only one practitioner may furnish and bill the service in a calendar month. 5.The patient may stop CCM services at any time (effective at the end of the month) by phone call to the office staff. 6. The patient will be responsible for cost sharing (co-pay) of up to 20% of the service fee (after annual deductible is met). Patient agreed to services and consent obtained. ? ?Patient Care Team: ?Lillard Anes, MD as PCP - General (Family Medicine) ?Josue Hector, MD as Consulting Physician (Cardiology) ?Lane Hacker, [facility] (Pharmacist) ?Deirdre Peer, LCSW as Education officer, museum (Licensed Holiday representative) ? ?Recent office  visits: ?05/15/2020 - Glucose 104, kidney tests stable stage 3a, liver tests normal, triglycerides high 180 and LDL cholesterol high at 105, TSH 2.55 normal, cbc normal, vitamin d low 5.3, I will call in Rx. ?01/12/2020 - CBC normal, glucose 102, kidney tests remain stage 3, liver tests normal, triglycerides 217 high and LDL cholesterol 213- very high suggest cardiology consult to assist in lipid lowering with history of vascular disease ? ?Recent consult visits: ?01/25/2020 - cardiology pharmacist - patient assistance for pass. Can consider adding zetia or rosuvastatin to Praluent. Patient should only be on clopidogrel >3 months out of stroke not aspirin.  ?01/14/2020 - cardiology - f/u with neurology. Intolerant to statins. Refer to lipid clinic to start PSK9 if not cost prohibitive.  ? ?Hospital visits: ?None in previous 6 months ? ?Objective: ? ?Lab Results  ?Component Value Date  ? CREATININE 0.93 07/06/2021  ? BUN 15 07/06/2021  ? GFRNONAA >60 07/06/2021  ? GFRAA 59 (L) 05/15/2020  ? NA 137 07/06/2021  ? K 3.5 07/06/2021  ? CALCIUM 9.2 07/06/2021  ? CO2 23 07/06/2021  ? ? ?Lab Results  ?Component Value Date/Time  ? HGBA1C 5.0 07/05/2021 04:37 AM  ? HGBA1C 5.6 08/01/2015 02:43 AM  ?  ?Last diabetic Eye exam: No results found for: HMDIABEYEEXA  ?Last diabetic Foot exam: No results found for: HMDIABFOOTEX  ? ?Lab Results  ?Component Value Date  ? CHOL 156 07/05/2021  ? HDL 36 (L) 07/05/2021  ? [place] 78 07/05/2021  ? TRIG 212 (H) 07/05/2021  ? CHOLHDL 4.3 07/05/2021  ? ? ? ?  Latest Ref Rng & Units 06/04/2021  ?  2:38 PM 03/14/2021  ? 10:51 AM 02/06/2021  ?  2:38 PM  ?Hepatic Function  ?Total Protein 6.5 - 8.1 g/dL 6.5   7.0   6.7    ?Albumin 3.5 - 5.0 g/dL 3.4   4.1   4.2    ?AST 15 - 41 U/L _0 ?ALT 0 - 44 U/L _1 ?Alk Phosphatase 38 - 126 U/L 86   118   123    ?Total Bilirubin 0.3 - 1.2 mg/dL 0.4   0.4   0.2    ? ? ?Lab Results  ?Component Value Date/Time  ? TSH 2.850 03/14/2021 10:52  AM  ? TSH 2.550 05/15/2020 10:31 AM  ? ? ? ?  Latest Ref Rng & Units 07/06/2021  ?  3:32 AM 07/05/2021  ?  4:37 AM 07/04/2021  ?  8:49 PM  ?CBC  ?WBC 4.0 - 10.5 K/uL 4.8   5.3   5.7    ?Hemoglobin 12.0 - 15.0 g/dL 10.8   11.0   12.2    ?Hematocrit 36.0 - 46.0 % 32.2   34.0   38.6    ?Platelets 150 - 400 K/uL 242   249   278    ? ? ?Lab Results  ?Component Value Date/Time  ? VD25OH 31.6 09/12/2020 10:24 AM  ? VD25OH 5.3 (L) 05/15/2020 10:31 AM  ? ? ?Clinical ASCVD: Yes  ?The ASCVD Risk score (Arnett DK, et al., 2019) failed to calculate for the following reasons: ?  The 2019 ASCVD risk score is only valid for ages 31 to 59 ?  The patient has a prior MI or stroke diagnosis   ? ? ?  02/06/2021  ?  2:15 PM 12/18/2020  ? 11:01 AM 09/12/2020  ? 10:06 AM  ?Depression screen PHQ 2/9  ?Decreased Interest _2 ?Down, Depressed, Hopeless 3 0 1  ?PHQ - 2 Score _3 ?Altered sleeping _4 ?Tired, decreased energy _5 ?Change in appetite 0 2 0  ?Feeling bad or failure about yourself  0 0 0  ?Trouble concentrating 0 0 0  ?Moving slowly or fidgety/restless 2 0 0  ?Suicidal thoughts 0 0 0  ?PHQ-9 Score _6 ?Difficult doing work/chores Not difficult at all Somewhat difficult Not difficult at all  ?  ? ?Social History  ? ?Tobacco Use  ?Smoking Status Never  ?Smokeless Tobacco Never  ? ?BP Readings from Last 3 Encounters:  ?07/06/21 (!) 187/74  ?06/08/21 (!) 160/98  ?06/04/21 (!) 153/88  ? ?Pulse Readings from Last 3 Encounters:  ?07/06/21 79  ?06/08/21 77  ?06/04/21 84  ? ?Wt Readings from Last 3 Encounters:  ?07/05/21 157 lb 3 oz (71.3 kg)  ?06/08/21 159 lb (72.1 kg)  ?06/01/21 160 lb (72.6 kg)  ? ? ?Assessment/Interventions: Review of patient past medical history, allergies, medications, health status, including review of consultants reports, laboratory and other test data, was performed as part of comprehensive evaluation and provision of chronic care management services.  ? ?SDOH:  (Social Determinants of Health)  assessments and interventions performed: Yes ?SDOH Interventions   ? ?Flowsheet Row Most Recent Value  ?SDOH Interventions   ?Financial Strain Interventions Intervention Not Indicated  ?Transportation Interventions Intervention Not Indicated  ? ?  ? ? ? ?[place] ? ?Allergies  ?Allergen Reactions  ? Codeine Nausea And Vomiting  ?  Gabapentin   ?  Dizziness, couldn't function   ? Meclizine Other (See Comments)  ?  Lethargy, extreme sleepiness  ? Requip [Ropinirole Hcl] Nausea And Vomiting  ? Statins   ?  Muscle weakness  ? ? ?Medications Reviewed Today   ? ? Reviewed by Lane Hacker, [facility] (Pharmacist) on 07/10/21 at 1308  Med List Status: <None>  ? ?Medication Order Taking? Sig Documenting Provider Last Dose Status Informant  ?Acetaminophen (TYLENOL) 325 MG CAPS [id number] No Take 650 mg by mouth every 6 (six) hours as needed (pain). [provider] Past Month Active Child  ?Alirocumab (PRALUENT) 75 MG/ML SOAJ [id number] No Inject 1 pen into the skin every 14 (fourteen) days.  ?Patient taking differently: Inject 75 mg into the skin every 14 (fourteen) days.  ? Josue Hector, MD 06/24/2021 Active Child  ?amLODipine (NORVASC) 10 MG tablet [id number] No Take 1 tablet (10 mg total) by mouth daily.  ?Patient taking differently: Take 10 mg by mouth every evening.  ? Lillard Anes, MD 07/04/2021 Active Child  ?ARTIFICIAL TEAR SOLUTION OP [id number] No Place 1 drop into both eyes daily as needed (dry eyes). [provider] Past Month Active Child  ?aspirin 81 MG EC tablet [id number]  Take 1 tablet (81 mg total) by mouth daily. Swallow whole. Eugenie Filler, MD  Active   ?calcium carbonate (TUMS - DOSED IN MG ELEMENTAL CALCIUM) 500 MG chewable tablet [id number] No Chew 1 tablet by mouth daily as needed for indigestion or heartburn. [provider] unk Active Child  ?cyanocobalamin (,VITAMIN B-12,) 1000 MCG/ML injection [id number] No Inject 1 mL (1,000 mcg total) into the muscle every 30  (thirty) days. Lillard Anes, MD 06/19/2021 Active Child  ?D3-50 1.25 MG (50000 UT) capsule [id number] No TAKE 1 CAPSULE BY MOUTH ONE TIME PER WEEK  ?Patient taking differently: Take 50,000 Units by mouth ev

## 2021-07-10 NOTE — Patient Instructions (Signed)
Visit Information ? ? Goals Addressed   ?None ?  ? ?Patient Care Plan: CCM Pharmacy Care Plan  ?  ? ?Problem Identified: pvd, htn, hld, depression   ?Priority: High  ?Onset Date: 06/13/2020  ?  ? ?Long-Range Goal: Disease Management   ?Start Date: 06/13/2020  ?Expected End Date: 06/13/2021  ?Recent Progress: On track  ?Priority: High  ?Note:   ?Current Barriers:  ?Unable to independently afford treatment regimen ?Unable to achieve control of depression  ? ?Pharmacist Clinical Goal(s):  ?Patient will verbalize ability to afford treatment regimen ?achieve control of depression  as evidenced by improved PHQ-9 through collaboration with PharmD and provider.  ? ?Interventions: ?1:1 collaboration with [name], MD regarding development and update of comprehensive plan of care as evidenced by provider attestation and co-signature ?Inter-disciplinary care team collaboration (see longitudinal plan of care) ?Comprehensive medication review performed; medication list updated in electronic medical record ? ?Hypertension (BP goal 130-150 systolic per Neuro 04/03/21) ?BP Readings from Last 3 Encounters:  ?07/06/21 (!) 187/74  ?06/08/21 (!) 160/98  ?06/04/21 (!) 153/88  ?-Controlled ?-Current treatment: ?amlodipine 10 mg daily Query Appropriate, Query effective, Query Safe, Accessible ?Valsartan 320 mg daily Query Appropriate, Query effective, Query Safe, Accessible ?-Medications previously tried: none reported  ?-Current home readings:  not checking  ?-Current dietary habits:  limits sweets. Eats frozen dinners. Doesn't follow a heart healthy diet.  ?-Current exercise habits:  walks around yard and house. Has a bad knee that limits.  ?-Denies hypotensive/hypertensive symptoms ?-Educated on BP goals and benefits of medications for prevention of heart attack, stroke and kidney damage; ?Daily salt intake goal < 2300 mg; ?Exercise goal of 150 minutes per week; ?-Counseled to monitor BP at home as needed, document, and  provide log at future appointments ?-Counseled on diet and exercise extensively ?Jan 2023: Daughter, [name], is convinced patient's lethargy and imbalance is due to hypotension. Neuro stated Vit B12 is low and is most likely the cause ?Plan: Will test BP daily to determine orthostasis. Will take Amlodipine AM and Valsartan PM to help as well ?Feb 2023: Patient's daughter is very frustrated with patient's overall healthcare. Is afraid of issues with Valsartan 320mg , asked for Valsartan 320mg  to be changed to Valsartan 160mg  BID. Sent direct msg to Dr. who said no. Called patient to inform her and she scheduled a visit to see PCP next week.  ?April 2023: Patient daughter states that the hospital said the reason for her mother's stroke was due to her BP being too low. Because of this, they are adamant they don't wish to take Valsartan, especially at the full dose. Recommended they discuss this with the PCP ASAP but they prefer to wait until f/u next week ? ?Hyperlipidemia/PVD: (LDL goal < 55) ?The ASCVD Risk score (Arnett DK, et al., 2019) failed to calculate for the following reasons: ?  The 2019 ASCVD risk score is only valid for ages 40 to 15 ?  The patient has a prior MI or stroke diagnosis ?Lab Results  ?Component Value Date  ? CHOL 156 07/05/2021  ? CHOL 152 03/14/2021  ? CHOL 192 09/12/2020  ? ?Lab Results  ?Component Value Date  ? HDL 36 (L) 07/05/2021  ? HDL 33 (L) 03/14/2021  ? HDL 40 09/12/2020  ? ?Lab Results  ?Component Value Date  ? LDLCALC 78 07/05/2021  ? LDLCALC 71 03/14/2021  ? LDLCALC 109 (H) 09/12/2020  ? ?Lab Results  ?Component Value Date  ? TRIG 212 (H) 07/05/2021  ?  TRIG 300 (H) 03/14/2021  ? TRIG 252 (H) 09/12/2020  ? ?Lab Results  ?Component Value Date  ? CHOLHDL 4.3 07/05/2021  ? CHOLHDL 4.6 (H) 03/14/2021  ? CHOLHDL 4.8 (H) 09/12/2020  ?No results found for: LDLDIRECT ?-Not ideally controlled ?-Current treatment: ?Praluent 75 mg/ml injection every 14 days Appropriate, Effective, Safe,  Accessible ?Ticagrelor 90mg  BID (07/04/21: "-Neurology recommended dual antiplatelet therapy of aspirin and Brilinta for 3 months followed by Brilinta alone.")Appropriate, Effective, Safe, Accessible ?-Medications previously tried: statins, Clopidogrel (Changed to Ticagrelor April 2023) ?-Current dietary patterns:  cooks at home. Eats some frozen dinners. Banana sandwich, Cheese sandwich. Daughter brings leftovers for meals. Trying to watch sweets. Drinking water with May 2023.  ?-Current exercise habits: walks around yard and house. Has a bad knee.  ?-Educated on Cholesterol goals;  ?Importance of limiting foods high in cholesterol; ?Exercise goal of 150 minutes per week; ?-Counseled on diet and exercise extensively ?April 2023: Coordinated with [employer] to synch ? ? ?GERD (Goal: manage symptoms and avoid triggers) ?-Controlled ?-Current treatment  ?Famotidine 20mg  BID (Started in hospital due to DAPT) Appropriate, Effective, Safe, Accessible ?-Medications previously tried: PPI (Dc'd due to Osteoporosis) ?-Counseled on diet and exercise extensively ?Recommended to continue current medication ?Educated on avoiding triggers, eating smaller meals, elevating head during rest/sleep.  ? ?Depression/Anxiety (Goal: improve depression/PHQ9) ?-Not ideally controlled ?-Current treatment: ?Trintellix 10mg  QD Appropriate, Effective, Safe, Query accessible ?-Medications previously tried/failed: sertraline  ?-PHQ9: 7 ?-Educated on Benefits of medication for symptom control ?Benefits of cognitive-behavioral therapy with or without medication ?-Counseled on exercise as a way to improved depression. Discussed gratitude journaling. Patient works to focus on what she is thankful for.  ?Jan 2023: Is expensive for patient, will do PAP ? ?Osteoporosis / Osteopenia (Goal reduce risk of fracture) ?Last vitamin D ?Lab Results  ?Component Value Date  ? VD25OH 31.6 09/12/2020  ?-Not ideally controlled - unable to assess since  patient declines updated Dexa ?-Last DEXA Scan:  none in record - last date indicated 08/08/2005.  ?-Current treatment  ?Vitamin D 50,000 weekly Appropriate, Effective, Safe, Accessible ?-Medications previously tried: none reported  ?-Recommend [phone] units of vitamin D daily. Recommend 1200 mg of calcium daily from dietary and supplemental sources. Recommend weight-bearing and muscle strengthening exercises for building and maintaining bone density. ?-Counseled on diet and exercise extensively ?Recommended consider Dexa scan update. Patient declines due to age. Discussed eating a diet rich in calcium through milk, cheese and dark, leafy green vegetables.   ?Jan 2023: Patient hasn't been taking, explained how to take, when, and why ?April 2023: Recommend repeat lab ? ? ? ?Patient Goals/Self-Care Activities ?Patient will:  ?- take medications as prescribed ?focus on medication adherence by using pill box ?target a minimum of 150 minutes of moderate intensity exercise weekly ?engage in dietary modifications by limiting sodium and working on heart healthy diet.  ? ?Follow Up Plan: Telephone follow up appointment with care management team member scheduled for: May 2023 ? ?Feb 2023, Pharm.D. - [phone] ? ?  ? ?Patient Care Plan: LCSW Plan of Care  ?  ? ?Problem Identified: Functional Decline/Caregiver Stress   ?Priority: High  ?  ? ?Long-Range Goal: Provide resources, support and guidance to improve QOL and care needs   ?Start Date: 06/21/2021  ?Expected End Date: 10/21/2021  ?This Visit's Progress: On track  ?Recent Progress: On track  ?Priority: High  ?Note:   ?Current Barriers:  ?Limited social support, Transportation, Level of care concerns, ADL IADL limitations, Limited access  to caregiver, and Darylene Price knowledge of community resource:    ?Transportation and Social Connections ?Level of Care Concerns:care needs for pt and spouse ? ?CSW Clinical Goal(s):  ?Caregiver  will explore community resource options for  unmet needs related to:  Transportation, Social Connections, Stress, and Physical Activity ?patient will work with SW to address concerns related to care needs, caregiver stress, etc  through collaboration wit

## 2021-07-11 NOTE — Telephone Encounter (Signed)
Returned call, okay to leave a message for patient's daughter, [name], if a call back is needed. ?

## 2021-07-12 ENCOUNTER — Ambulatory Visit: Payer: Medicare HMO | Admitting: *Deleted

## 2021-07-12 DIAGNOSIS — I10 Essential (primary) hypertension: Secondary | ICD-10-CM

## 2021-07-12 DIAGNOSIS — N1831 Chronic kidney disease, stage 3a: Secondary | ICD-10-CM

## 2021-07-12 DIAGNOSIS — Z636 Dependent relative needing care at home: Secondary | ICD-10-CM

## 2021-07-12 DIAGNOSIS — I639 Cerebral infarction, unspecified: Secondary | ICD-10-CM

## 2021-07-12 NOTE — Chronic Care Management (AMB) (Signed)
?Chronic Care Management  ? ? Clinical Social Work Note ? ?07/12/2021 ?Name: [name] MRN: [id number] DOB: [birth year] ? ?[name] is a 81 y.o. year old female who is a primary care patient of [name], MD. The CCM team was consulted to assist the patient with chronic disease management and/or care coordination needs related to: [employer]  and Caregiver Stress.  ? ?Engaged with patient by telephone for follow up visit in response to provider referral for social work chronic care management and care coordination services.  ? ?Consent to Services:  ?The patient was given information about Chronic Care Management services, agreed to services, and gave verbal consent prior to initiation of services.  Please see initial visit note for detailed documentation.  ? ?Patient agreed to services and consent obtained.  ? ?Assessment: Review of patient past medical history, allergies, medications, and health status, including review of relevant consultants reports was performed today as part of a comprehensive evaluation and provision of chronic care management and care coordination services.    ? ?SDOH (Social Determinants of Health) assessments and interventions performed:  ?SDOH Interventions   ? ?Flowsheet Row Most Recent Value  ?SDOH Interventions   ?Physical Activity Interventions Other (Comments)  [VIA HH AGENCY (PT/OT)]  ?Transportation Interventions Intervention Not Indicated  ? ?  ?  ? ?Advanced Directives Status: Not addressed in this encounter. ? ?CCM Care Plan ? ?Allergies  ?Allergen Reactions  ? Codeine Nausea And Vomiting  ? Gabapentin   ?  Dizziness, couldn't function   ? Meclizine Other (See Comments)  ?  Lethargy, extreme sleepiness  ? Requip [Ropinirole Hcl] Nausea And Vomiting  ? Statins   ?  Muscle weakness  ? ? ?Outpatient Encounter Medications as of 07/12/2021  ?Medication Sig  ? Acetaminophen (TYLENOL) 325 MG CAPS Take 650 mg by mouth every 6 (six) hours as needed (pain).  ?  Alirocumab (PRALUENT) 75 MG/ML SOAJ Inject 1 pen into the skin every 14 (fourteen) days. (Patient taking differently: Inject 75 mg into the skin every 14 (fourteen) days.)  ? amLODipine (NORVASC) 10 MG tablet Take 1 tablet (10 mg total) by mouth daily. (Patient taking differently: Take 10 mg by mouth every evening.)  ? ARTIFICIAL TEAR SOLUTION OP Place 1 drop into both eyes daily as needed (dry eyes).  ? aspirin 81 MG EC tablet Take 1 tablet (81 mg total) by mouth daily. Swallow whole.  ? calcium carbonate (TUMS - DOSED IN MG ELEMENTAL CALCIUM) 500 MG chewable tablet Chew 1 tablet by mouth daily as needed for indigestion or heartburn.  ? cyanocobalamin (,VITAMIN B-12,) 1000 MCG/ML injection Inject 1 mL (1,000 mcg total) into the muscle every 30 (thirty) days.  ? D3-50 1.25 MG (50000 UT) capsule TAKE 1 CAPSULE BY MOUTH ONE TIME PER WEEK (Patient taking differently: Take 50,000 Units by mouth every Tuesday.)  ? famotidine (PEPCID) 20 MG tablet Take 1 tablet (20 mg total) by mouth daily.  ? furosemide (LASIX) 20 MG tablet TAKE 1 TABLET (20 MG TOTAL) BY MOUTH DAILY. TAKING EVERY DAY FOR SWELLING (Patient taking differently: Take 20 mg by mouth daily as needed for edema.)  ? latanoprost (XALATAN) 0.005 % ophthalmic solution Place 1 drop into both eyes at bedtime.  ? loratadine (CLARITIN) 10 MG tablet Take 10 mg by mouth daily as needed for allergies.  ? Multiple Vitamins-Minerals (PRESERVISION AREDS 2) CAPS Take 1 capsule by mouth 3 (three) times a week. No set days  ? OVER THE COUNTER  MEDICATION Apply 1 application. topically daily as needed (knee pain). Hempvana pain relief cream  ? ticagrelor (BRILINTA) 90 MG TABS tablet Take 1 tablet (90 mg total) by mouth 2 (two) times daily.  ? valsartan (DIOVAN) 160 MG tablet Take 1 tablet (160 mg total) by mouth every evening.  ? vitamin B-12 (CYANOCOBALAMIN) 1000 MCG tablet Take 1,000 mcg by mouth every evening.  ? vortioxetine HBr (TRINTELLIX) 10 MG TABS tablet Take 1 tablet  (10 mg total) by mouth daily. (Patient taking differently: Take 10 mg by mouth every evening.)  ? ?No facility-administered encounter medications on file as of 07/12/2021.  ? ? ?Patient Active Problem List  ? Diagnosis Date Noted  ? Essential (primary) hypertension   ? Esophagitis with gastritis 07/05/2021  ? Ischemic stroke (HCC)   ? Acute multifocal cerebral infarction, left hemisphere [facility]) 07/04/2021  ? Arterial stenosis (HCC) 05/07/2021  ? Carotid stenosis, non-symptomatic, right 05/07/2021  ? Incisional infection 12/25/2020  ? Visit for suture removal 12/25/2020  ? History of stroke with residual effects 05/15/2020  ? Heat exhaustion 11/04/2019  ? Chronic kidney disease, stage 3a (HCC) 10/28/2019  ? Upper GI bleed 10/28/2019  ? Hiatal hernia 10/28/2019  ? Essential hypertension 10/28/2019  ? Depression, major, single episode, severe (HCC) 10/14/2019  ? BMI 32.0-32.9,adult 10/01/2019  ? PVD (peripheral vascular disease) (HCC) 10/01/2019  ? Pain in right knee 10/01/2019  ? Benign paroxysmal vertigo, unspecified ear 06/07/2019  ? Primary insomnia 05/25/2019  ? 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 Acute embolic stroke (HCC)   ? Stroke (cerebrum) (HCC) 06/10/2015  ? Mixed hyperlipidemia 02/13/2015  ? Mild aortic stenosis 08/13/2013  ? LBBB (left bundle branch block) 07/12/2011  ? Carotid bruit 07/12/2011  ? Chest pain 07/12/2011  ? Murmur 07/12/2011  ? ? ?Conditions to be addressed/monitored: CAD, CKD Stage 3, and Dementia; Level of care concerns, Limited access to caregiver, Cognitive Deficits, and Lacks knowledge of community resource:   ? ?Care Plan : LCSW Plan of Care  ?Updates made by [name], LCSW since 07/12/2021 12:00 AM  ?  ? ?Problem: Functional Decline/Caregiver Stress   ?Priority: High  ?  ? ?Long-Range Goal: Provide resources, support and guidance to improve QOL and care needs   ?Start Date: 06/21/2021  ?Expected End Date: 10/21/2021  ?This Visit's Progress: On track  ?Recent  Progress: On track  ?Priority: High  ?Note:   ?Current Barriers:  ?Limited social support, Transportation, Level of care concerns, ADL IADL limitations, Limited access to caregiver, and Lacks knowledge of community resource:    ?Transportation and Social Connections ?Level of Care Concerns:care needs for pt and spouse ? ?CSW Clinical Goal(s):  ?Caregiver  will explore community resource options for unmet needs related to:  Transportation, Social Connections, Stress, and Physical Activity ?patient will work with SW to address concerns related to care needs, caregiver stress, etc  through collaboration with Visual merchandiser, provider, and care team.  ? ?Interventions: ? ?CSW spoke with pt's daughter/caregiver, [name], who reports pt was recently hospitalized "with multiple strokes". [facility] has begun with PT, OT, RN visits.  ?Daughter upset that her daughter has taken her granddaughter away; "after raising her since she was born". Pt's daughter does not feel good about the granddaughter's situation (living with her mother and her female friend).  ?CSW advised her of options including calling the school social worker, Patent examiner and/or CPS.  ?Offered support and encouragement to pt's daughter-  ? ? .  Daughter is going back and forth daily to aide in care of pt. Pt lives with her husband of 23 years (he will soon be 36).  ?Daughter is feeling overwhelmed with caregiving; also dealing with her own loss of spouse last year. "I have not had time to grieve". CSW validated all of her emotions and encouraged participation in grief counseling as well as looking at ways to minimize her caregiving responsibilities.  Daughter asking about getting [facility] out to work with pt- CSW will ask PCP to place referral for [facility], PT, OT evals. CSW talked with daughter about hired caregiver support and she will check pt's 3 insurance policies to see if they cover this. Otherwise, they may consider private pay option  as discussed.  ?1:1 collaboration with primary care provider regarding development and update of comprehensive plan of care as evidenced by provider attestation and co-signature ?Inter-disciplinary care te

## 2021-07-12 NOTE — Patient Instructions (Signed)
Visit Information ? ?Thank you for taking time to visit with me today. Please don't hesitate to contact me if I can be of assistance to you before our next scheduled telephone appointment. ? ?Our next appointment is by telephone on 08/02/21   ? ?Please call the care guide team at [phone] if you need to cancel or reschedule your appointment.  ? ?If you are experiencing a Mental Health or Behavioral Health Crisis or need someone to talk to, please call 911  ? ?The patient verbalized understanding of instructions, educational materials, and care plan provided today and declined offer to receive copy of patient instructions, educational materials, and care plan.  ? ?Reece Levy MSW, LCSW ?Licensed Clinical Social Worker ?[facility]   ?[phone]  ?

## 2021-07-12 NOTE — Progress Notes (Signed)
? ?NEUROLOGY FOLLOW UP OFFICE NOTE ? ?[facility] ?[id number] ? ?Assessment/Plan:  ? ?Left hemispheric infarcts and right PCA territory infarcts secondary to severe intracranial stenosis ?Cerebral aneurysms ?Hypertension ?Hyperlipidemia ? ? ?ASA 81mg  daily and Brilinta 90mg  twice daily for 3 months followed by Brilinta 90mg  twice daily alone ?Praluent.  Has statin intolerance.  LDL goal less than 70 ?Keep blood pressure 120-140/80-90 - follow up with Dr. (has appointment tomorrow) ?Hgb A1c goal less than 7 ?Continued monitoring of cerebral aneurysms ?Repeat CTA of head in 6 months. ?Follow up in 6 months (after repeat CTA of head) ? ? ?Subjective:  ?[name] is an 81 year old right-handed female with CHF, HTN, HLD, CKD and history of CVA who follows up for recent stroke.  History supplemented by her daughter. ? 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  She was referred to interventional neuroradiology where she underwent cerebral angiogram on 06/01/2021 who favored continued monitoring due to high risk.  Following the angiogram, she developed thigh hematoma and right leg weakness.  She went to the ED on 06/04/2021 where DVT was ruled out and was advised by IR to continue Plavix.  On 06/07/2021, she developed weakness of right hand.  PCP ordered MRV of head performed on 06/14/2021  which showed abnormal areas of enhancement suspicious for subacute infarcts in the left occipital and parietal lobes.  On 07/04/2021, she developed headache, aphasia, slurred speech and right facial droop so she returned to the ED on 07/04/2021 where MRI of brian again showed patchy left hemispheric infarcts and right PCA territory infarcts.  MRA head and neck again showed diffuse atheromatous disease with stenoses and cerebral aneurysms.  TEE showed LVEF 70-75% with grade I diastolic dysfunction but no cardiac source of emboli.  LDL was 78.  Hgb A1c was 5.  She was on Plavix prior to admission.  She was discharged on ASA 81mg  daily and Brilinta 90mg  twice daily for 3 months followed by Brilinta alone.   ? ?Valsartan 1/2 if blood pressure to low - goal 120-140//80-85 ?  ?HISTORY: ?In September, she started experiencing heaviness in her legs causing her to fall.  No associated dizziness or loss of consciousness.  Legs just give out.  She has pain in her legs but no numbness.  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  She previously used a cane but had to resort to a walker.  She also noticed some weakness in the right arm as well.  Venlafaxine was changed to Trintellix which helped but symptoms continued.  Daughter is concerned that she may be on too much antihypertensive medications.  CT head performed on 03/14/2021 showed chronic small vessel ischemic changes in the cerebral white matter with remote left cerebellar infarct but no acute intracranial abnormalities.  LDL was 71.  Serum glucose was 111.  TSH was 2.850.  B12 was low at 165 and she was started on monthly injections 3 weeks ago.   ?  ?She has prior history of bilateral pontine and cerebellar embolic strokes in March 2017 presenting as slurred speech and left leg weakness.  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  She also was found to have bilateral ICA stenosis.  Carotid ultrasound from 06/20/2020 showed 40-59% left ICA stenosis and 1-39% right ICA stenosis. ? ?PAST MEDICAL HISTORY: ?Past Medical History:  ?Diagnosis Date  ? Benign paroxysmal vertigo, unspecified ear 06/07/2019  ? CKD (chronic kidney disease), stage III (HCC)   ? GERD (gastroesophageal reflux disease)   ? Glaucoma   ? ? ?MEDICATIONS: ?Current Outpatient Medications on File Prior to Visit  ?Medication Sig Dispense Refill  ? Acetaminophen (TYLENOL) 325 MG CAPS Take 650 mg by mouth every 6 (six) hours as needed (pain).    ? Alirocumab (PRALUENT) 75 MG/ML SOAJ Inject 1 pen into the skin every 14 (fourteen) days. (Patient taking differently: Inject 75 mg into the skin every 14 (fourteen) days.) 6 mL 3  ? amLODipine (NORVASC) 10 MG tablet Take 1 tablet (10 mg total) by mouth daily. (Patient taking differently: Take 10 mg by mouth every evening.) 90 tablet 1  ? ARTIFICIAL TEAR SOLUTION OP Place 1 drop into both eyes daily as needed (dry eyes).    ? aspirin 81 MG EC tablet Take 1 tablet (81 mg total) by mouth daily. Swallow whole. 90 tablet 0  ? calcium carbonate (TUMS - DOSED IN MG ELEMENTAL CALCIUM) 500 MG chewable tablet Chew 1 tablet by mouth daily as needed for indigestion or heartburn.    ? cyanocobalamin (,VITAMIN B-12,) 1000 MCG/ML injection Inject 1 mL (1,000 mcg total) into the muscle every 30 (thirty) days. 30 mL 2  ? D3-50 1.25 MG (50000 UT) capsule TAKE 1 CAPSULE BY MOUTH ONE TIME PER WEEK (Patient taking differently: Take 50,000 Units by mouth every Tuesday.) 12 capsule 3  ? famotidine (PEPCID) 20 MG tablet Take 1 tablet (20 mg total) by mouth daily. 90 tablet 2  ? furosemide (LASIX) 20 MG tablet TAKE 1 TABLET (20 MG TOTAL) BY MOUTH DAILY. TAKING EVERY DAY FOR SWELLING (Patient taking differently: Take 20 mg by mouth daily as needed for edema.) 90 tablet 2  ? latanoprost (XALATAN) 0.005 % ophthalmic solution Place 1 drop into both eyes at  bedtime.    ? loratadine (CLARITIN) 10 MG tablet Take 10 mg by mouth daily as needed for allergies.    ? Multiple Vitamins-Minerals (PRESERVISION AREDS 2) CAPS Take 1 capsule by mouth 3 (three) times a week. No set days    ? OVER THE COUNTER MEDICATION Apply 1 application. topically daily as needed (knee pain). Hempvana pain relief cream    ? ticagrelor (BRILINTA) 90 MG TABS tablet Take 1 tablet (90 mg total) by mouth 2 (two) times daily. 60 tablet 2  ? valsartan (DIOVAN) 160 MG tablet Take 1 tablet (160 mg total) by mouth every evening.    ? vitamin B-12 (CYANOCOBALAMIN) 1000 MCG tablet Take 1,000 mcg by mouth every evening.    ? vortioxetine HBr (TRINTELLIX) 10 MG TABS tablet Take 1 tablet (10 mg total) by mouth daily. (Patient taking differently: Take 10 mg by mouth every evening.) 90 tablet 1  ? ?No current facility-administered medications on file prior to visit.  ? ? ?ALLERGIES: ?Allergies  ?Allergen Reactions  ? Codeine  Nausea And Vomiting  ? Gabapentin   ?  Dizziness, couldn't function   ? Meclizine Other (See Comments)  ?  Lethargy, extreme sleepiness  ? Requip [Ropinirole Hcl] Nausea And Vomiting  ? Statins   ?  Muscle weakness  ? ? ?FAMILY HISTORY: ?Family History  ?Problem Relation Age of Onset  ? Hypertension Mother   ? Hypertension Father   ? Heart attack Father   ? Hypertension Brother   ? Diabetes Brother   ? ? ?  ?Objective:  ?Blood pressure (!) 176/74, pulse 77, height 5' (1.524 m), weight 158 lb (71.7 kg), SpO2 98 %. ?General: No acute distress.  Patient appears well-groomed.   ?Head:  Normocephalic/atraumatic ?Eyes:  Fundi examined but not visualized ?Neck: supple, no paraspinal tenderness, full range of motion ?Heart:  Regular rate and rhythm ?Lungs:  Clear to auscultation bilaterally ?Back: No paraspinal tenderness ?Neurological Exam: alert and oriented to person, place, and time.  Speech fluent and not dysarthric, language intact.  CN II-XII intact. Bulk and tone normal, muscle strength 5/5  throughout.  Sensation to pinprick and vibration intact.  Deep tendon reflexes 2+ throughout, toes downgoing.  Finger to nose testing intact.  Gait testing deferred.  In wheelchair.   ? ? ?Shon Millet, D

## 2021-07-13 ENCOUNTER — Other Ambulatory Visit: Payer: Self-pay

## 2021-07-14 NOTE — Telephone Encounter (Signed)
error 

## 2021-07-16 ENCOUNTER — Ambulatory Visit (INDEPENDENT_AMBULATORY_CARE_PROVIDER_SITE_OTHER): Payer: Medicare HMO | Admitting: Neurology

## 2021-07-16 ENCOUNTER — Encounter: Payer: Self-pay | Admitting: Neurology

## 2021-07-16 ENCOUNTER — Other Ambulatory Visit (HOSPITAL_BASED_OUTPATIENT_CLINIC_OR_DEPARTMENT_OTHER): Payer: Self-pay

## 2021-07-16 VITALS — BP 176/74 | HR 77 | Ht 60.0 in | Wt 158.0 lb

## 2021-07-16 DIAGNOSIS — I679 Cerebrovascular disease, unspecified: Secondary | ICD-10-CM | POA: Diagnosis not present

## 2021-07-16 DIAGNOSIS — I671 Cerebral aneurysm, nonruptured: Secondary | ICD-10-CM

## 2021-07-16 DIAGNOSIS — E785 Hyperlipidemia, unspecified: Secondary | ICD-10-CM | POA: Diagnosis not present

## 2021-07-16 DIAGNOSIS — I651 Occlusion and stenosis of basilar artery: Secondary | ICD-10-CM | POA: Diagnosis not present

## 2021-07-16 DIAGNOSIS — I63532 Cerebral infarction due to unspecified occlusion or stenosis of left posterior cerebral artery: Secondary | ICD-10-CM

## 2021-07-16 DIAGNOSIS — I10 Essential (primary) hypertension: Secondary | ICD-10-CM | POA: Diagnosis not present

## 2021-07-16 NOTE — Patient Instructions (Addendum)
Continue aspirin 81mg  daily and Brilinta 90mg  twice daily.  On 10/05/2021, stop aspirin and continue Brilinta 90mg  twice daily alone. ?Continue Praluent ?Keep blood pressure 120-140/80-90 - follow up with Dr. ?Check CTA of head in 6 months. ?Follow up after repeat CTA of head. ?

## 2021-07-17 ENCOUNTER — Telehealth (HOSPITAL_BASED_OUTPATIENT_CLINIC_OR_DEPARTMENT_OTHER): Payer: Self-pay

## 2021-07-17 ENCOUNTER — Ambulatory Visit: Payer: Medicare HMO | Admitting: Legal Medicine

## 2021-07-17 ENCOUNTER — Other Ambulatory Visit (HOSPITAL_BASED_OUTPATIENT_CLINIC_OR_DEPARTMENT_OTHER): Payer: Self-pay

## 2021-07-17 NOTE — Telephone Encounter (Signed)
Transitions of Care Pharmacy  ? ?Call attempted for a pharmacy transitions of care follow-up. HIPAA appropriate voicemail was left with call back information provided.  ? ?Call attempt #1. Will follow-up in 2-3 days.  ?  ? ?Darcus Austin, PharmD ?Clinical Pharmacist ?[facility] ?07/17/2021 10:00 AM ? ?

## 2021-07-18 ENCOUNTER — Telehealth (HOSPITAL_COMMUNITY): Payer: Self-pay

## 2021-07-18 ENCOUNTER — Other Ambulatory Visit (HOSPITAL_COMMUNITY): Payer: Self-pay

## 2021-07-18 NOTE — Telephone Encounter (Signed)
Transitions of Care Pharmacy  ? ?Call attempted for a pharmacy transitions of care follow-up.   ? ?Call attempt #2. Will follow-up in 2-3 days.  ? ?Jiles Crocker, PharmD ?Clinical Pharmacist ?[facility] ?07/18/2021 4:38 PM ? ?  ?

## 2021-07-22 DIAGNOSIS — I639 Cerebral infarction, unspecified: Secondary | ICD-10-CM | POA: Diagnosis not present

## 2021-07-22 DIAGNOSIS — I10 Essential (primary) hypertension: Secondary | ICD-10-CM

## 2021-07-22 DIAGNOSIS — N1831 Chronic kidney disease, stage 3a: Secondary | ICD-10-CM | POA: Diagnosis not present

## 2021-07-22 DIAGNOSIS — E782 Mixed hyperlipidemia: Secondary | ICD-10-CM

## 2021-07-23 ENCOUNTER — Ambulatory Visit (INDEPENDENT_AMBULATORY_CARE_PROVIDER_SITE_OTHER): Payer: Medicare HMO | Admitting: Nurse Practitioner

## 2021-07-23 ENCOUNTER — Telehealth (HOSPITAL_BASED_OUTPATIENT_CLINIC_OR_DEPARTMENT_OTHER): Payer: Self-pay

## 2021-07-23 ENCOUNTER — Encounter: Payer: Self-pay | Admitting: Nurse Practitioner

## 2021-07-23 ENCOUNTER — Other Ambulatory Visit: Payer: Self-pay | Admitting: Nurse Practitioner

## 2021-07-23 ENCOUNTER — Other Ambulatory Visit (HOSPITAL_BASED_OUTPATIENT_CLINIC_OR_DEPARTMENT_OTHER): Payer: Self-pay

## 2021-07-23 VITALS — BP 176/82 | HR 87 | Temp 97.4°F | Wt 154.0 lb

## 2021-07-23 DIAGNOSIS — I6930 Unspecified sequelae of cerebral infarction: Secondary | ICD-10-CM

## 2021-07-23 DIAGNOSIS — R54 Age-related physical debility: Secondary | ICD-10-CM | POA: Diagnosis not present

## 2021-07-23 DIAGNOSIS — E876 Hypokalemia: Secondary | ICD-10-CM

## 2021-07-23 DIAGNOSIS — Z7689 Persons encountering health services in other specified circumstances: Secondary | ICD-10-CM | POA: Diagnosis not present

## 2021-07-23 DIAGNOSIS — E538 Deficiency of other specified B group vitamins: Secondary | ICD-10-CM

## 2021-07-23 DIAGNOSIS — I639 Cerebral infarction, unspecified: Secondary | ICD-10-CM

## 2021-07-23 DIAGNOSIS — K219 Gastro-esophageal reflux disease without esophagitis: Secondary | ICD-10-CM

## 2021-07-23 DIAGNOSIS — I10 Essential (primary) hypertension: Secondary | ICD-10-CM | POA: Diagnosis not present

## 2021-07-23 DIAGNOSIS — N1831 Chronic kidney disease, stage 3a: Secondary | ICD-10-CM | POA: Diagnosis not present

## 2021-07-23 DIAGNOSIS — R531 Weakness: Secondary | ICD-10-CM | POA: Diagnosis not present

## 2021-07-23 LAB — CBC WITH DIFFERENTIAL/PLATELET
Basophils Absolute: 0 10*3/uL (ref 0.0–0.2)
Basos: 1 %
EOS (ABSOLUTE): 0.2 10*3/uL (ref 0.0–0.4)
Eos: 4 %
Hematocrit: 40.5 % (ref 34.0–46.6)
Hemoglobin: 13.1 g/dL (ref 11.1–15.9)
Immature Grans (Abs): 0 10*3/uL (ref 0.0–0.1)
Immature Granulocytes: 0 %
Lymphocytes Absolute: 1.2 10*3/uL (ref 0.7–3.1)
Lymphs: 21 %
MCH: 30 pg (ref 26.6–33.0)
MCHC: 32.3 g/dL (ref 31.5–35.7)
MCV: 93 fL (ref 79–97)
Monocytes Absolute: 0.7 10*3/uL (ref 0.1–0.9)
Monocytes: 13 %
Neutrophils Absolute: 3.5 10*3/uL (ref 1.4–7.0)
Neutrophils: 61 %
Platelets: 346 10*3/uL (ref 150–450)
RBC: 4.36 x10E6/uL (ref 3.77–5.28)
RDW: 12.4 % (ref 11.7–15.4)
WBC: 5.6 10*3/uL (ref 3.4–10.8)

## 2021-07-23 MED ORDER — CYANOCOBALAMIN 1000 MCG/ML IJ SOLN
1000.0000 ug | Freq: Once | INTRAMUSCULAR | Status: AC
  Administered 2021-07-23: 1000 ug via INTRAMUSCULAR

## 2021-07-23 MED ORDER — FAMOTIDINE 20 MG PO TABS: 20.0000 mg | ORAL_TABLET | Freq: Every day | ORAL | 2 refills | Status: DC

## 2021-07-23 MED ORDER — VALSARTAN 80 MG PO TABS: 160.0000 mg | ORAL_TABLET | Freq: Every day | ORAL | 3 refills | Status: DC

## 2021-07-23 MED ORDER — ASPIRIN 81 MG PO TBEC
81.0000 mg | DELAYED_RELEASE_TABLET | Freq: Every day | ORAL | 0 refills | Status: AC
Start: 2021-07-23 — End: 2021-10-21

## 2021-07-23 NOTE — Patient Instructions (Addendum)
?Continue medications ?Fall precautions ?Follow-up  ? ? ?Fall Prevention in the Home, Adult ?Falls can cause injuries and can happen to people of all ages. There are many things you can do to make your home safe and to help prevent falls. Ask for help when making these changes. ?What actions can I take to prevent falls? ?General Instructions ?Use good lighting in all rooms. Replace any light bulbs that burn out. ?Turn on the lights in dark areas. Use night-lights. ?Keep items that you use often in easy-to-reach places. Lower the shelves around your home if needed. ?Set up your furniture so you have a clear path. Avoid moving your furniture around. ?Do not have throw rugs or other things on the floor that can make you trip. ?Avoid walking on wet floors. ?If any of your floors are uneven, fix them. ?Add color or contrast paint or tape to clearly mark and help you see: ?Grab bars or handrails. ?First and last steps of staircases. ?Where the edge of each step is. ?If you use a stepladder: ?Make sure that it is fully opened. Do not climb a closed stepladder. ?Make sure the sides of the stepladder are locked in place. ?Ask someone to hold the stepladder while you use it. ?Know where your pets are when moving through your home. ?What can I do in the bathroom? ? ?  ? ?Keep the floor dry. Clean up any water on the floor right away. ?Remove soap buildup in the tub or shower. ?Use nonskid mats or decals on the floor of the tub or shower. ?Attach bath mats securely with double-sided, nonslip rug tape. ?If you need to sit down in the shower, use a plastic, nonslip stool. ?Install grab bars by the toilet and in the tub and shower. Do not use towel bars as grab bars. ?What can I do in the bedroom? ?Make sure that you have a light by your bed that is easy to reach. ?Do not use any sheets or blankets for your bed that hang to the floor. ?Have a firm chair with side arms that you can use for support when you get dressed. ?What can I  do in the kitchen? ?Clean up any spills right away. ?If you need to reach something above you, use a step stool with a grab bar. ?Keep electrical cords out of the way. ?Do not use floor polish or wax that makes floors slippery. ?What can I do with my stairs? ?Do not leave any items on the stairs. ?Make sure that you have a light switch at the top and the bottom of the stairs. ?Make sure that there are handrails on both sides of the stairs. Fix handrails that are broken or loose. ?Install nonslip stair treads on all your stairs. ?Avoid having throw rugs at the top or bottom of the stairs. ?Choose a carpet that does not hide the edge of the steps on the stairs. ?Check carpeting to make sure that it is firmly attached to the stairs. Fix carpet that is loose or worn. ?What can I do on the outside of my home? ?Use bright outdoor lighting. ?Fix the edges of walkways and driveways and fix any cracks. ?Remove anything that might make you trip as you walk through a door, such as a raised step or threshold. ?Trim any bushes or trees on paths to your home. ?Check to see if handrails are loose or broken and that both sides of all steps have handrails. ?Install guardrails along the edges of any  raised decks and porches. ?Clear paths of anything that can make you trip, such as tools or rocks. ?Have leaves, snow, or ice cleared regularly. ?Use sand or salt on paths during winter. ?Clean up any spills in your garage right away. This includes grease or oil spills. ?What other actions can I take? ?Wear shoes that: ?Have a low heel. Do not wear high heels. ?Have rubber bottoms. ?Feel good on your feet and fit well. ?Are closed at the toe. Do not wear open-toe sandals. ?Use tools that help you move around if needed. These include: ?Canes. ?Walkers. ?Scooters. ?Crutches. ?Review your medicines with your doctor. Some medicines can make you feel dizzy. This can increase your chance of falling. ?Ask your doctor what else you can do to help  prevent falls. ?Where to find more information ?Centers for Disease Control and Prevention, [employer]: FootballExhibition.com.br ?[employer] on Aging: https://walker.com/ ?Contact a doctor if: ?You are afraid of falling at home. ?You feel weak, drowsy, or dizzy at home. ?You fall at home. ?Summary ?There are many simple things that you can do to make your home safe and to help prevent falls. ?Ways to make your home safe include removing things that can make you trip and installing grab bars in the bathroom. ?Ask for help when making these changes in your home. ?This information is not intended to replace advice given to you by your health care provider. Make sure you discuss any questions you have with your health care provider. ?Document Revised: 12/11/2020 Document Reviewed: 10/13/2019 ?Elsevier Patient Education ? 2023 Elsevier Inc[employer] ? ? ? ?Hospital Discharge After a Stroke  ?Being discharged from the hospital after a stroke can feel overwhelming. Many things may be different. It is normal to feel scared or anxious. Some stroke survivors may be able to return to their homes. Others may need more specialized care on a temporary or permanent basis. ?Your stroke care team will work with you to develop a discharge plan that is best for you. Ask questions if you do not understand something. Invite a friend or family member to participate in discharge planning. It is important to understand and follow your discharge plan to help prevent another stroke or other problems. ?General recommendations ? ?Ask a friend or family member to get needed things in place before you go home if possible. A therapist can come to your home to make recommendations for safety equipment. Ask your health care provider if you would benefit from this service or from home care. ?Take steps to prevent falls, such as: ?Installing grab bars in the shower or using a shower chair. ?Install grab bars by the toilet. ?Removing tripping hazards, such as area rugs or  cords. ?Supplies needed ?Ask your health care provider for a list of medical equipment and supplies you will need at home. These may include: ?Walkers. ?Canes. ?Wheelchairs. ?Hand-strengthening devices. ?Special eating utensils. ?Medical equipment can be rented or purchased, depending on your insurance coverage. Check with your insurance company about what is covered. ?Follow these instructions at home: ?Medicines ?Take all medicines exactly as told by your health care provider to prevent serious harm, such as another stroke. Make sure you understand: ?What medicine to take. ?Why you are taking the medicine. ?How and when to take it. ?If it can be taken with your other medicines and herbal supplements. ?Possible side effects, and when to call your health care provider if you have side effects. ?How you will get and pay for your medicines. Medical assistance  programs may be able to help you pay for prescription medicines if you cannot afford them. ?If you are taking an anticoagulant: ?Be sure to take it exactly as told by your health care provider. This medicine can increase the risk of bleeding because it works to prevent blood clots. You may need to take certain precautions to prevent bleeding. ?Do not take medicines that contain aspirin or NSAIDs, such as ibuprofen, unless your health care provider approves. These medicines increase your risk for dangerous bleeding. ?Preventing another stroke ?Having a stroke puts you at risk for another stroke in the future. Ask your health care provider what actions you can take to lower the risk. These may include: ?Increasing how much you exercise. ?Making a healthy eating plan. ?Quitting smoking. ?Managing other health conditions, such as high blood pressure, high cholesterol, or diabetes. ?Limiting alcohol use. ?General instructions ?Before you leave the hospital, you will be given information about stroke warning signs. Share these with your friends and family members. ?You  will need to follow up regularly with a health care provider. You may need rehabilitation, such as physical therapy, occupational therapy, and speech-language therapy. Keeping these appointments is

## 2021-07-23 NOTE — Telephone Encounter (Signed)
Transitions of Care Pharmacy  ? ?Call attempted for a pharmacy transitions of care follow-up.  ? ?Call attempt #3. Final attempt ? ?Jiles Crocker, PharmD ?Clinical Pharmacist ?[facility] ?07/23/2021 3:56 PM ?  ?  ?

## 2021-07-23 NOTE — Progress Notes (Signed)
? ?Subjective:  ?Patient ID: [name], female    DOB: November 05, 1940  Age: 81 y.o. MRN: [id number] ? ?Chief Complaint  ?Patient presents with  ? Hospitalization Follow-up  ? ? ?HPI ? [name] is an 81 year old Caucasian female that presents for hospital f/u for an ischemic CVA. She is seated in a w/c. accompanied by her daughter who helps supplement health history. Her daughter states pt's spouse is unable to care for her at home due to dementia. Family members rotate assisting pt with  with ADLs and provide meals daily. Reports pt has OT, PT, and nursing care home health. Pt does not currently have advance directives in place, in process per daughter. ? ?Hospital follow-up ? ?Patient was admitted to [facility] on 07/04/2021 and discharged on 07/09/2021. ?She was treated for ischemic stroke. ?Treatment for this included: Neurology recommended dual antiplatelet therapy of aspirin and Brilinta for 3 months followed by Brilinta alone. ?She reports excellent compliance with treatment. ?She reports this condition is improved. ? ?Current Outpatient Medications on File Prior to Visit  ?Medication Sig Dispense Refill  ? Acetaminophen (TYLENOL) 325 MG CAPS Take 650 mg by mouth every 6 (six) hours as needed (pain).    ? Alirocumab (PRALUENT) 75 MG/ML SOAJ Inject 1 pen into the skin every 14 (fourteen) days. (Patient taking differently: Inject 75 mg into the skin every 14 (fourteen) days.) 6 mL 3  ? amLODipine (NORVASC) 10 MG tablet Take 1 tablet (10 mg total) by mouth daily. (Patient taking differently: Take 10 mg by mouth every evening.) 90 tablet 1  ? aspirin 81 MG EC tablet Take 1 tablet (81 mg total) by mouth daily. Swallow whole. 90 tablet 0  ? calcium carbonate (TUMS - DOSED IN MG ELEMENTAL CALCIUM) 500 MG chewable tablet Chew 1 tablet by mouth daily as needed for indigestion or heartburn.    ? cyanocobalamin (,VITAMIN B-12,) 1000 MCG/ML injection Inject 1 mL (1,000 mcg total) into the muscle every 30 (thirty) days. 30 mL 2  ?  D3-50 1.25 MG (50000 UT) capsule TAKE 1 CAPSULE BY MOUTH ONE TIME PER WEEK (Patient taking differently: Take 50,000 Units by mouth every Tuesday.) 12 capsule 3  ? famotidine (PEPCID) 20 MG tablet Take 1 tablet (20 mg total) by mouth daily. 90 tablet 2  ? furosemide (LASIX) 20 MG tablet TAKE 1 TABLET (20 MG TOTAL) BY MOUTH DAILY. TAKING EVERY DAY FOR SWELLING (Patient taking differently: Take 20 mg by mouth daily as needed for edema.) 90 tablet 2  ? latanoprost (XALATAN) 0.005 % ophthalmic solution Place 1 drop into both eyes at bedtime.    ? loratadine (CLARITIN) 10 MG tablet Take 10 mg by mouth daily as needed for allergies.    ? OVER THE COUNTER MEDICATION Apply 1 application. topically daily as needed (knee pain). Hempvana pain relief cream    ? ticagrelor (BRILINTA) 90 MG TABS tablet Take 1 tablet (90 mg total) by mouth 2 (two) times daily. 60 tablet 2  ? ticagrelor (BRILINTA) 90 MG TABS tablet Take 90 mg by mouth 2 (two) times daily.    ? valsartan (DIOVAN) 160 MG tablet Take 1 tablet (160 mg total) by mouth every evening.    ? vitamin B-12 (CYANOCOBALAMIN) 1000 MCG tablet Take 1,000 mcg by mouth every evening.    ? vortioxetine HBr (TRINTELLIX) 10 MG TABS tablet Take 1 tablet (10 mg total) by mouth daily. (Patient taking differently: Take 10 mg by mouth every evening.) 90 tablet 1  ? ?No current  facility-administered medications on file prior to visit.  ? ?Past Medical History:  ?Diagnosis Date  ? Benign paroxysmal vertigo, unspecified ear 06/07/2019  ? CKD (chronic kidney disease), stage III ([place])   ? GERD (gastroesophageal reflux disease)   ? Glaucoma   ? ?Past Surgical History:  ?Procedure Laterality Date  ? ABDOMINAL HYSTERECTOMY    ? BACK SURGERY    ? cataract surgery    ? COLONOSCOPY N/A 08/03/2015  ? Procedure: COLONOSCOPY;  Surgeon: Laurence Spates, MD;  Location: [facility] ENDOSCOPY;  Service: Endoscopy;  Laterality: N/A;  ? ESOPHAGOGASTRODUODENOSCOPY N/A 07/31/2015  ? Procedure: ESOPHAGOGASTRODUODENOSCOPY (EGD);   Surgeon: Laurence Spates, MD;  Location: [facility] ENDOSCOPY;  Service: Endoscopy;  Laterality: N/A;  ? IR ANGIO INTRA EXTRACRAN SEL COM CAROTID INNOMINATE BILAT MOD SED  06/01/2021  ? IR ANGIO VERTEBRAL SEL SUBCLAVIAN INNOMINATE UNI L MOD SED  06/01/2021  ? IR RADIOLOGIST EVAL & MGMT  04/26/2021  ? OOPHORECTOMY    ? TOTAL HIP ARTHROPLASTY    ? URETER SURGERY    ?  ?Family History  ?Problem Relation Age of Onset  ? Hypertension Mother   ? Hypertension Father   ? Heart attack Father   ? Hypertension Brother   ? Diabetes Brother   ? ?Social History  ? ?Socioeconomic History  ? Marital status: Married  ?  Spouse name: Not on file  ? Number of children: Not on file  ? Years of education: Not on file  ? Highest education level: Not on file  ?Occupational History  ? Not on file  ?Tobacco Use  ? Smoking status: Never  ? Smokeless tobacco: Never  ?Vaping Use  ? Vaping Use: Never used  ?Substance and Sexual Activity  ? Alcohol use: No  ? Drug use: No  ? Sexual activity: Not Currently  ?Other Topics Concern  ? Not on file  ?Social History Narrative  ? Not on file  ? ?Social Determinants of Health  ? ?Financial Resource Strain: Low Risk   ? Difficulty of Paying Living Expenses: Not hard at all  ?Food Insecurity: Not on file  ?Transportation Needs: No Transportation Needs  ? Lack of Transportation (Medical): No  ? Lack of Transportation (Non-Medical): No  ?Physical Activity: Insufficiently Active  ? Days of Exercise per Week: 2 days  ? Minutes of Exercise per Session: 30 min  ?Stress: Not on file  ?Social Connections: Not on file  ? ? ?Review of Systems  ?Constitutional:  Negative for appetite change, fatigue and unexpected weight change.  ?Musculoskeletal:  Positive for gait problem.  ?Neurological:  Positive for facial asymmetry (right side) and weakness (right side).  ?All other systems reviewed and are negative. ? ? ?Objective:  ?BP (!) 176/82   Pulse 87   Temp (!) 97.4 ?F (36.3 ?C)   Wt 154 lb (69.9 kg)   SpO2 97%   BMI 30.08  kg/m?   ? ? ?  07/23/2021  ?  9:55 AM 07/16/2021  ?  1:23 PM 07/06/2021  ?  3:15 PM  ?BP/Weight  ?Systolic BP  0000000 [id number]  ?Diastolic BP  74 74  ?Wt. (Lbs) 154 158   ?BMI 30.08 kg/m2 30.86 kg/m2   ? ? ?Physical Exam ?Vitals reviewed.  ?Constitutional:   ?   Appearance: She is ill-appearing (frail in appearance).  ?Eyes:  ?   Extraocular Movements: Extraocular movements intact.  ?   Comments: Right eye ptosis present  ?Cardiovascular:  ?   Rate and Rhythm: Normal rate and regular  rhythm.  ?   Pulses: Normal pulses.  ?   Heart sounds: Normal heart sounds.  ?Pulmonary:  ?   Effort: Pulmonary effort is normal.  ?   Breath sounds: Normal breath sounds.  ?Abdominal:  ?   General: Bowel sounds are normal.  ?   Palpations: Abdomen is soft.  ?Skin: ?   General: Skin is warm and dry.  ?   Capillary Refill: Capillary refill takes less than 2 seconds.  ?   Findings: Bruising (left forefoot) present.  ?Neurological:  ?   Mental Status: Mental status is at baseline.  ?   Motor: Weakness (right) present.  ?   Gait: Gait abnormal.  ?Psychiatric:     ?   Mood and Affect: Mood normal.     ?   Behavior: Behavior normal.  ? ? ?  ? ?Lab Results  ?Component Value Date  ? WBC 4.8 07/06/2021  ? HGB 10.8 (L) 07/06/2021  ? HCT 32.2 (L) 07/06/2021  ? PLT 242 07/06/2021  ? GLUCOSE 98 07/06/2021  ? CHOL 156 07/05/2021  ? TRIG 212 (H) 07/05/2021  ? HDL 36 (L) 07/05/2021  ? [place] 78 07/05/2021  ? ALT 12 06/04/2021  ? AST 15 06/04/2021  ? NA 137 07/06/2021  ? K 3.5 07/06/2021  ? CL 111 07/06/2021  ? CREATININE 0.93 07/06/2021  ? BUN 15 07/06/2021  ? CO2 23 07/06/2021  ? TSH 2.850 03/14/2021  ? INR 1.0 06/01/2021  ? HGBA1C 5.0 07/05/2021  ? ? ? ? ?Assessment & Plan:  ? ? ?1. History of stroke with residual effects ?-assist with ADLs ?-fall precautions, ambulate with walker ?-continue PT, OT, and nursing home health services as scheduled ? ?2. Essential (primary) hypertension ?- valsartan (DIOVAN) 80 MG tablet; Take 2 tablets (160 mg total) by mouth  daily. If systolic BP greater than [id number] mmHG give 160 mg by mouth daily, if systolic BP between 0000000 mmHG give 80 mg by mouth daily  Dispense: 180 tablet; Refill: 3 ?- CBC with Differential ? ?3. B12 deficienc

## 2021-07-24 LAB — COMPREHENSIVE METABOLIC PANEL
ALT: 11 IU/L (ref 0–32)
AST: 12 IU/L (ref 0–40)
Albumin/Globulin Ratio: 1.6 (ref 1.2–2.2)
Albumin: 4.1 g/dL (ref 3.7–4.7)
Alkaline Phosphatase: 113 IU/L (ref 44–121)
BUN/Creatinine Ratio: 19 (ref 12–28)
BUN: 21 mg/dL (ref 8–27)
Bilirubin Total: 0.3 mg/dL (ref 0.0–1.2)
CO2: 20 mmol/L (ref 20–29)
Calcium: 11 mg/dL — ABNORMAL HIGH (ref 8.7–10.3)
Chloride: 109 mmol/L — ABNORMAL HIGH (ref 96–106)
Creatinine, Ser: 1.12 mg/dL — ABNORMAL HIGH (ref 0.57–1.00)
Globulin, Total: 2.6 g/dL (ref 1.5–4.5)
Glucose: 97 mg/dL (ref 70–99)
Potassium: 4.5 mmol/L (ref 3.5–5.2)
Sodium: 142 mmol/L (ref 134–144)
Total Protein: 6.7 g/dL (ref 6.0–8.5)
eGFR: 50 mL/min/{1.73_m2} — ABNORMAL LOW (ref >59)

## 2021-07-24 LAB — MAGNESIUM: Magnesium: 2 mg/dL (ref 1.6–2.3)

## 2021-07-25 ENCOUNTER — Other Ambulatory Visit: Payer: Medicare HMO

## 2021-07-26 ENCOUNTER — Telehealth: Payer: Medicare HMO

## 2021-07-26 ENCOUNTER — Telehealth: Payer: Self-pay

## 2021-07-26 ENCOUNTER — Other Ambulatory Visit: Payer: Self-pay | Admitting: Nurse Practitioner

## 2021-07-26 DIAGNOSIS — E559 Vitamin D deficiency, unspecified: Secondary | ICD-10-CM

## 2021-07-26 NOTE — Progress Notes (Signed)
? ? ?Chronic Care Management ?Pharmacy Assistant  ? ?Name: [name]  MRN: [id number] DOB: [birth year] ? ? ?Reason for Encounter: Refill Process ? ?New script was sent in for Valsartan 80mg  and what was packed previously was for 160mg . I called pt and spoke with and she has already been cutting the 160mg  in half and sometimes has held it until the next day.  We are going to send in a partial fill of the new dose and pt daughter, will take the 160mg  out of packs.  ? ?[name] was very overwhelmed today. Her mom just was seen in the hospital for clusters of strokes and it has affected her mom. She stated her mom cannot pick up the phone anymore and call her daughter. Pt husband has dementia and they live alone. stated her mom can get up on her own with her walker but gets confused now after the strokes. [name] has daughters of her own that has Asthma and cannot stay with her mom for long. She called social services and stated they were denied a CNA since they made too much money. needs help but she does not know where to look. She was very sad because her mom wanted to call her but was unable to since the strokes and she can tell her mom is burden because of the recent decline. They do have an LPN that comes out but they need more assistance. I advised to [name] that I would look into some things and ask around to see if there is extra help out here for them. [name] wants to know if there is anything that offers a phone services that will call out with just one push of a button. I told her if I can find anything out I will follow up with her and [name] was very appreciative.  ? ?Medications: ?Outpatient Encounter Medications as of 07/26/2021  ?Medication Sig Note  ? Acetaminophen (TYLENOL) 325 MG CAPS Take 650 mg by mouth every 6 (six) hours as needed (pain).   ? Alirocumab (PRALUENT) 75 MG/ML SOAJ Inject 1 pen into the skin every 14 (fourteen) days. (Patient taking differently: Inject 75 mg into the  skin every 14 (fourteen) days.)   ? amLODipine (NORVASC) 10 MG tablet Take 1 tablet (10 mg total) by mouth daily. (Patient taking differently: Take 10 mg by mouth every evening.)   ? aspirin 81 MG EC tablet Take 1 tablet (81 mg total) by mouth daily. Swallow whole.   ? calcium carbonate (TUMS - DOSED IN MG ELEMENTAL CALCIUM) 500 MG chewable tablet Chew 1 tablet by mouth daily as needed for indigestion or heartburn.   ? cyanocobalamin (,VITAMIN B-12,) 1000 MCG/ML injection Inject 1 mL (1,000 mcg total) into the muscle every 30 (thirty) days.   ? D3-50 1.25 MG (50000 UT) capsule TAKE 1 CAPSULE BY MOUTH ONE TIME PER WEEK (Patient taking differently: Take 50,000 Units by mouth every Tuesday.)   ? famotidine (PEPCID) 20 MG tablet Take 1 tablet (20 mg total) by mouth daily.   ? furosemide (LASIX) 20 MG tablet TAKE 1 TABLET (20 MG TOTAL) BY MOUTH DAILY. TAKING EVERY DAY FOR SWELLING (Patient taking differently: Take 20 mg by mouth daily as needed for edema.)   ? latanoprost (XALATAN) 0.005 % ophthalmic solution Place 1 drop into both eyes at bedtime.   ? loratadine (CLARITIN) 10 MG tablet Take 10 mg by mouth daily as needed for allergies.   ? OVER THE COUNTER MEDICATION  Apply 1 application. topically daily as needed (knee pain). Hempvana pain relief cream   ? ticagrelor (BRILINTA) 90 MG TABS tablet Take 1 tablet (90 mg total) by mouth 2 (two) times daily.   ? ticagrelor (BRILINTA) 90 MG TABS tablet Take 90 mg by mouth 2 (two) times daily. 07/16/2021: Alternate Route: BY MOUTH. Med Classification: Hematological Agents  ? valsartan (DIOVAN) 80 MG tablet Take 2 tablets (160 mg total) by mouth daily. If systolic BP greater than 140 mmHG give 160 mg by mouth daily, if systolic BP between 120-140 mmHG give 80 mg by mouth daily   ? vitamin B-12 (CYANOCOBALAMIN) 1000 MCG tablet Take 1,000 mcg by mouth every evening.   ? vortioxetine HBr (TRINTELLIX) 10 MG TABS tablet Take 1 tablet (10 mg total) by mouth daily. (Patient taking  differently: Take 10 mg by mouth every evening.)   ? ?No facility-administered encounter medications on file as of 07/26/2021.  ? ? ?[name], CMA ?Clinical Pharmacist Assistant  ?[phone]  ?

## 2021-07-30 ENCOUNTER — Telehealth: Payer: Self-pay

## 2021-07-30 LAB — VITAMIN D 25 HYDROXY (VIT D DEFICIENCY, FRACTURES): Vit D, 25-Hydroxy: 49.9 ng/mL (ref 30.0–100.0)

## 2021-07-30 LAB — SPECIMEN STATUS REPORT

## 2021-07-30 NOTE — Progress Notes (Signed)
? ? ?Chronic Care Management ?Pharmacy Assistant  ? ?Name: [name]  MRN: [id number] DOB: 1940/12/04 ? ? ?Reason for Encounter: Medication Coordination for [place]  ?  ?Recent office visits:  ?07/23/21 [name] NP. Seen for hx of stroke. Changed Valsartan 160mg  Take 2 tablets (160 mg total) by mouth daily. If systolic BP greater than 140 mmHG give 160 mg by mouth daily, if systolic BP between 120-140 mmHG give 80 mg by mouth daily. ? ?07/10/21 07/12/21 MD. Orders Only.  ? ?Recent consult visits:  ?07/16/21 (Neurology) 07/18/21 DO. Seen for CVA. No med changes. ? ?Hospital visits:  ?None ? ?Medications: ?Outpatient Encounter Medications as of 07/30/2021  ?Medication Sig Note  ? Acetaminophen (TYLENOL) 325 MG CAPS Take 650 mg by mouth every 6 (six) hours as needed (pain).   ? Alirocumab (PRALUENT) 75 MG/ML SOAJ Inject 1 pen into the skin every 14 (fourteen) days. (Patient taking differently: Inject 75 mg into the skin every 14 (fourteen) days.)   ? amLODipine (NORVASC) 10 MG tablet Take 1 tablet (10 mg total) by mouth daily. (Patient taking differently: Take 10 mg by mouth every evening.)   ? aspirin 81 MG EC tablet Take 1 tablet (81 mg total) by mouth daily. Swallow whole.   ? calcium carbonate (TUMS - DOSED IN MG ELEMENTAL CALCIUM) 500 MG chewable tablet Chew 1 tablet by mouth daily as needed for indigestion or heartburn.   ? cyanocobalamin (,VITAMIN B-12,) 1000 MCG/ML injection Inject 1 mL (1,000 mcg total) into the muscle every 30 (thirty) days.   ? D3-50 1.25 MG (50000 UT) capsule TAKE 1 CAPSULE BY MOUTH ONE TIME PER WEEK (Patient taking differently: Take 50,000 Units by mouth every Tuesday.)   ? famotidine (PEPCID) 20 MG tablet Take 1 tablet (20 mg total) by mouth daily.   ? furosemide (LASIX) 20 MG tablet TAKE 1 TABLET (20 MG TOTAL) BY MOUTH DAILY. TAKING EVERY DAY FOR SWELLING (Patient taking differently: Take 20 mg by mouth daily as needed for edema.)   ? latanoprost (XALATAN) 0.005 %  ophthalmic solution Place 1 drop into both eyes at bedtime.   ? loratadine (CLARITIN) 10 MG tablet Take 10 mg by mouth daily as needed for allergies.   ? OVER THE COUNTER MEDICATION Apply 1 application. topically daily as needed (knee pain). Hempvana pain relief cream   ? ticagrelor (BRILINTA) 90 MG TABS tablet Take 1 tablet (90 mg total) by mouth 2 (two) times daily.   ? ticagrelor (BRILINTA) 90 MG TABS tablet Take 90 mg by mouth 2 (two) times daily. 07/16/2021: Alternate Route: BY MOUTH. Med Classification: Hematological Agents  ? valsartan (DIOVAN) 80 MG tablet Take 2 tablets (160 mg total) by mouth daily. If systolic BP greater than 140 mmHG give 160 mg by mouth daily, if systolic BP between 120-140 mmHG give 80 mg by mouth daily   ? vitamin B-12 (CYANOCOBALAMIN) 1000 MCG tablet Take 1,000 mcg by mouth every evening.   ? vortioxetine HBr (TRINTELLIX) 10 MG TABS tablet Take 1 tablet (10 mg total) by mouth daily. (Patient taking differently: Take 10 mg by mouth every evening.)   ? ?No facility-administered encounter medications on file as of 07/30/2021.  ? ? ?Reviewed chart for medication changes ahead of medication coordination call. ? ?No OVs, Consults, or hospital visits since last care coordination call/Pharmacist visit. (If appropriate, list visit date, provider name) ? ?No medication changes indicated OR if recent visit, treatment plan here. ? ?BP Readings from Last 3  Encounters:  ?07/23/21 (!) 176/82  ?07/16/21 (!) 176/74  ?07/06/21 (!) 187/74  ?  ?Lab Results  ?Component Value Date  ? HGBA1C 5.0 07/05/2021  ?  ? ?Patient obtains medications through Adherence Packaging  30 Days  ? ?Last adherence delivery included:  ?Valsartan 160mg  2 BT ?Trintellix 10mg  1 BT ?Furosemide 20mg - 1 tab daily (Bottles) ?Amlodipine 10mg  1 BT ?Clopidogrel 75mg  1 BT ?Vitamin D3 50,000units 1 BT on Tuesdays ? ?Patient declined (meds) last month  ?latanoprost cannot be filled until 4/24 ?Praluent 75mg /ml- Picked up at [facility] on 05/23/21 84  ds. Daughter wants to just keep it as [facility] ?Pantoprazole 40mg - Pt is no longer taking ?B12 Injection- Gets injections at the office  ? ?Patient is due for next adherence delivery on: 08/09/21. ?Called patient and reviewed medications and coordinated delivery. ? ?This delivery to include: ?Valsartan 80mg  1-2 tabs daily as directed according to systolic BP (Bottles) ?Trintellix 10mg  1 BT ?Furosemide 20mg - 1 tab daily (Bottles) ?Amlodipine 10mg  1 BT - Changes to 1 B ?Vitamin D3 50,000units 1 BT on Tuesdays ?Brilinta 90mg  1 at B and 1 at BT ?Famotidine 20mg  1 at B and 1 BT- Messaged provider to send in correct quantity ?Asprin 81 1 at BT ? ?Patient declined the following medications  ?Praluent 75mg - [facility] ?Vitamin B12- [facility], Gets injection at office  ?Claritin- Gets OTC ? ?Patient needs refills  ?None ? ?Confirmed delivery date of 518/23, advised patient that pharmacy will contact them the morning of delivery. ? ?5/24, CMA ?Clinical Pharmacist Assistant  ?[phone]  ?

## 2021-07-30 NOTE — Telephone Encounter (Signed)
Compliant on meds 

## 2021-07-31 ENCOUNTER — Other Ambulatory Visit: Payer: Self-pay

## 2021-07-31 ENCOUNTER — Other Ambulatory Visit: Payer: Self-pay | Admitting: Legal Medicine

## 2021-07-31 DIAGNOSIS — K219 Gastro-esophageal reflux disease without esophagitis: Secondary | ICD-10-CM

## 2021-07-31 MED ORDER — FAMOTIDINE 20 MG PO TABS: 20.0000 mg | ORAL_TABLET | Freq: Two times a day (BID) | ORAL | 2 refills | Status: DC

## 2021-08-01 ENCOUNTER — Ambulatory Visit: Payer: Medicare HMO | Admitting: Neurology

## 2021-08-02 ENCOUNTER — Telehealth: Payer: Medicare HMO

## 2021-08-07 ENCOUNTER — Telehealth: Payer: Medicare HMO

## 2021-08-10 ENCOUNTER — Telehealth: Payer: Self-pay

## 2021-08-22 ENCOUNTER — Telehealth: Payer: Self-pay

## 2021-08-22 NOTE — Telephone Encounter (Signed)
Pt fell two weeks ago-the daughter wants her to have a scan done of her wrist and elbow. she stated that she is having some swelling. She wants to get an x-ray done and not be seen. She stated that a nurse came out to the house today and stated she needs to an x-ray.   Dr. Sedalia Muta stated that she would be willing to do a video appt this afternoon with [name], if she is unable to then she will need to go to [facility]: [name].  I spoke with [name], who stated that she is able to do a video appt however, she does not have a phone that is able to do the appt at this time. I offered three in office/virtual appts for tomorrow, two in the morning and one in the afternoon. [name] stated that she doesn't know if her mother will go to the appt. [name] was then notified that if she is unable to do a virtual appt or come into the office then she will need to go the [facility] be [facility]. [name] stated that she will have to call us back. She was going to look into the [facility] in [place] to make sure they are able to do an X-ray. She stated she will call the office back.

## 2021-08-23 ENCOUNTER — Ambulatory Visit: Payer: Medicare HMO

## 2021-08-23 ENCOUNTER — Telehealth: Payer: Self-pay | Admitting: *Deleted

## 2021-08-23 ENCOUNTER — Telehealth: Payer: Self-pay

## 2021-08-23 NOTE — Chronic Care Management (AMB) (Signed)
  Chronic Care Management Note  08/23/2021 Name: [name] MRN: [id number] DOB: May 30, 1940  [name] is a 81 y.o. year old female who is a primary care patient of [name], MD and is actively engaged with the care management team. I reached out to Harlene Ramus by phone today to assist with re-scheduling a follow up visit with the Licensed Clinical Social Worker  Follow up plan: Unsuccessful telephone outreach attempt made. The care management team will reach out to the patient again over the next 7 days. If patient returns call to provider office, please advise to call Embedded Care Management Care Guide Misty Stanley at [phone].  Gwenevere Ghazi  Care Guide, Embedded Care Coordination [facility] Management  Direct Dial: [phone]

## 2021-08-23 NOTE — Telephone Encounter (Signed)
Received call from [facility] physical therapy w/ home health. She is reporting a fall that occurred 2-3 weeks ago around 5/16. Patient is continuing to complain of R wrist and forearm pain. Only injury is bruise on R elbow. Vitals are normal. Patient has refused ED and urgent care previously. Also requesting to continue physical therapy with continued falls. Verbal order given.   Did attempt to reach out to daughter. No answer, left VM for return call to assess patient.   Royce Macadamia, [place] 08/23/21 3:00 PM

## 2021-08-28 ENCOUNTER — Telehealth: Payer: Self-pay

## 2021-08-28 NOTE — Progress Notes (Signed)
Chronic Care Management Pharmacy Assistant   Name: [name]  MRN: [id number] DOB: 01/28/1941   Reason for Encounter: Medication Coordination for Upstream   Recent office visits:  None  Recent consult visits:  None  Hospital visits:  None  Medications: Outpatient Encounter Medications as of 08/28/2021  Medication Sig Note   Acetaminophen (TYLENOL) 325 MG CAPS Take 650 mg by mouth every 6 (six) hours as needed (pain).    Alirocumab (PRALUENT) 75 MG/ML SOAJ Inject 1 pen into the skin every 14 (fourteen) days. (Patient taking differently: Inject 75 mg into the skin every 14 (fourteen) days.)    amLODipine (NORVASC) 10 MG tablet Take 1 tablet (10 mg total) by mouth daily. (Patient taking differently: Take 10 mg by mouth every evening.)    aspirin 81 MG EC tablet Take 1 tablet (81 mg total) by mouth daily. Swallow whole.    calcium carbonate (TUMS - DOSED IN MG ELEMENTAL CALCIUM) 500 MG chewable tablet Chew 1 tablet by mouth daily as needed for indigestion or heartburn.    cyanocobalamin (,VITAMIN B-12,) 1000 MCG/ML injection Inject 1 mL (1,000 mcg total) into the muscle every 30 (thirty) days.    D3-50 1.25 MG (50000 UT) capsule TAKE 1 CAPSULE BY MOUTH ONE TIME PER WEEK (Patient taking differently: Take 50,000 Units by mouth every Tuesday.)    famotidine (PEPCID) 20 MG tablet Take 1 tablet (20 mg total) by mouth 2 (two) times daily.    furosemide (LASIX) 20 MG tablet TAKE 1 TABLET (20 MG TOTAL) BY MOUTH DAILY. TAKING EVERY DAY FOR SWELLING (Patient taking differently: Take 20 mg by mouth daily as needed for edema.)    latanoprost (XALATAN) 0.005 % ophthalmic solution Place 1 drop into both eyes at bedtime.    loratadine (CLARITIN) 10 MG tablet Take 10 mg by mouth daily as needed for allergies.    OVER THE COUNTER MEDICATION Apply 1 application. topically daily as needed (knee pain). Hempvana pain relief cream    ticagrelor (BRILINTA) 90 MG TABS tablet Take 1 tablet (90 mg total) by  mouth 2 (two) times daily.    ticagrelor (BRILINTA) 90 MG TABS tablet Take 90 mg by mouth 2 (two) times daily. (Patient not taking: Reported on 07/30/2021) 07/16/2021: Alternate Route: BY MOUTH. Med Classification: Hematological Agents   valsartan (DIOVAN) 80 MG tablet Take 2 tablets (160 mg total) by mouth daily. If systolic BP greater than [id number] mmHG give 160 mg by mouth daily, if systolic BP between 0000000 mmHG give 80 mg by mouth daily    vitamin B-12 (CYANOCOBALAMIN) 1000 MCG tablet Take 1,000 mcg by mouth every evening.    vortioxetine HBr (TRINTELLIX) 10 MG TABS tablet Take 1 tablet (10 mg total) by mouth daily. (Patient taking differently: Take 10 mg by mouth every evening.)    No facility-administered encounter medications on file as of 08/28/2021.    Reviewed chart for medication changes ahead of medication coordination call.  No OVs, Consults, or hospital visits since last care coordination call/Pharmacist visit.   No medication changes indicated OR if recent visit, treatment plan here.  BP Readings from Last 3 Encounters:  07/23/21 (!) 176/82  07/16/21 (!) 176/74  07/06/21 (!) 187/74    Lab Results  Component Value Date   HGBA1C 5.0 07/05/2021     Patient obtains medications through Adherence Packaging  30 Days   Last adherence delivery included:  Valsartan 80mg  1-2 tabs daily as directed according to systolic BP (Bottles) Trintellix 10mg   1 BT Furosemide 20mg - 1 tab daily (Bottles) Amlodipine 10mg  1 BT - Changes to 1 B Vitamin D3 50,000units 1 BT on Tuesdays Brilinta 90mg  1 at B and 1 at BT Famotidine 20mg  1 at B and 1 BT- Messaged provider to send in correct quantity Asprin 81 1 at BT  Patient declined (meds) last month  [facility]raluent [facility]mg - [facility]- [facility], Gets injection at [facility]  Claritin- Gets OTC  Patient is due for next adherence delivery on: 09/07/21. Called patient and reviewed medications and coordinated delivery.  This delivery to  include: Valsartan 80mg  1-2 tabs daily as directed according to systolic BP (Bottles) Trintellix 10mg  1 BT Furosemide 20mg - 1 tab daily (Bottles) Amlodipine 10mg  1 B Vitamin D3 50,000units 1 BT on Tuesdays Brilinta 90mg  1 at B and 1 at BT Famotidine 20mg  1 at B and 1 BT Asprin 81 1 at BT  Unable to reach pt after several attempts. Pharmacy will coordinate delivery.  Elray Mcgregor, [place] Pharmacist Assistant  [phone]

## 2021-09-03 DIAGNOSIS — F5101 Primary insomnia: Secondary | ICD-10-CM

## 2021-09-03 DIAGNOSIS — I2510 Atherosclerotic heart disease of native coronary artery without angina pectoris: Secondary | ICD-10-CM

## 2021-09-03 DIAGNOSIS — K219 Gastro-esophageal reflux disease without esophagitis: Secondary | ICD-10-CM

## 2021-09-03 DIAGNOSIS — E782 Mixed hyperlipidemia: Secondary | ICD-10-CM

## 2021-09-03 DIAGNOSIS — I129 Hypertensive chronic kidney disease with stage 1 through stage 4 chronic kidney disease, or unspecified chronic kidney disease: Secondary | ICD-10-CM

## 2021-09-03 DIAGNOSIS — F323 Major depressive disorder, single episode, severe with psychotic features: Secondary | ICD-10-CM

## 2021-09-03 DIAGNOSIS — N183 Chronic kidney disease, stage 3 unspecified: Secondary | ICD-10-CM

## 2021-09-03 DIAGNOSIS — I69322 Dysarthria following cerebral infarction: Secondary | ICD-10-CM

## 2021-09-03 DIAGNOSIS — H8110 Benign paroxysmal vertigo, unspecified ear: Secondary | ICD-10-CM

## 2021-09-03 DIAGNOSIS — H409 Unspecified glaucoma: Secondary | ICD-10-CM

## 2021-09-03 DIAGNOSIS — I6521 Occlusion and stenosis of right carotid artery: Secondary | ICD-10-CM

## 2021-09-03 DIAGNOSIS — I739 Peripheral vascular disease, unspecified: Secondary | ICD-10-CM

## 2021-09-03 DIAGNOSIS — I350 Nonrheumatic aortic (valve) stenosis: Secondary | ICD-10-CM

## 2021-09-03 DIAGNOSIS — K449 Diaphragmatic hernia without obstruction or gangrene: Secondary | ICD-10-CM

## 2021-09-03 NOTE — Chronic Care Management (AMB) (Signed)
  Chronic Care Management Note  09/03/2021 Name: [name] MRN: [id number] DOB: 1940/08/27  [name] is a 81 y.o. year old female who is a primary care patient of Lillard Anes, MD and is actively engaged with the care management team. I reached out to Leodis Sias by phone today to assist with re-scheduling a follow up visit with the Licensed Clinical Social Worker  Follow up plan: Unsuccessful telephone outreach attempt made. A HIPAA compliant phone message was left for the patient providing contact information and requesting a return call.  The care management team will reach out to the patient again over the next 7 days.  If patient returns call to provider office, please advise to call [place] at [phone].  [place] Management  Direct Dial: [phone]

## 2021-09-05 ENCOUNTER — Telehealth: Payer: Self-pay

## 2021-09-05 NOTE — Telephone Encounter (Signed)
Received a call from [facility] (difficult to understand due to language barrier) he stated he has faxed some documents that only require a signature from Dr. Marina Goodell. Last faxed 5/17 previous to that 5/3. Informed him we have had issues sending and receiving faxes over the past several weeks and to please refax the paperwork. Just wanted to make you aware if in fact you are not. He stated he will refax them today to our fax, confirmed our fax number as well.

## 2021-09-10 ENCOUNTER — Telehealth: Payer: Self-pay | Admitting: *Deleted

## 2021-09-10 NOTE — Telephone Encounter (Signed)
Patient's daughter said they did not ask for these products.

## 2021-09-10 NOTE — Chronic Care Management (AMB) (Addendum)
  Chronic Care Management Note  09/10/2021 Name: [name] MRN: [id number] DOB: 1940/08/28  [name] is a 81 y.o. year old female who is a primary care patient of [name], MD and is actively engaged with the care management team. I reached out to Harlene Ramus by phone today to assist with re-scheduling a follow up visit with the Licensed Clinical Social Worker  Follow up plan: A third Unsuccessful telephone outreach attempt made. Unable to make contact on outreach attempts x 3. PCP Dr. Marina Goodell notified via routed documentation in medical record. We have been unable to make contact with the patient for follow up. The care management team is available to follow up with the patient after provider conversation with the patient regarding recommendation for care management engagement and subsequent re-referral to the care management team.   [facility] Guide, Embedded Care Coordination [facility]  Care Management  Direct Dial: [phone]

## 2021-09-10 NOTE — Telephone Encounter (Signed)
  Care Management   Follow Up Note   09/10/2021 Name: [name] MRN: [id number] DOB: [birth year]   Referred by: [name], MD Reason for referral : No chief complaint on file.   Third unsuccessful telephone outreach was attempted today. The patient was referred to the case management team for assistance with care management and care coordination. The patient's primary care provider has been notified of our unsuccessful attempts to make or maintain contact with the patient. The care management team is pleased to engage with this patient at any time in the future should he/she be interested in assistance from the care management team.   Follow Up Plan: No further follow up required:    Reece Levy MSW, LCSW Licensed Clinical Social Worker [facility]   [phone]

## 2021-09-27 ENCOUNTER — Telehealth: Payer: Self-pay

## 2021-09-27 NOTE — Progress Notes (Signed)
Chronic Care Management Pharmacy Assistant   Name: [name]  MRN: [id number] DOB: [birth year]   Reason for Encounter: Medication Coordination for Upstream    Recent office visits:  None  Recent consult visits:  None  Hospital visits:  None  Medications: Outpatient Encounter Medications as of 09/27/2021  Medication Sig Note   Acetaminophen (TYLENOL) 325 MG CAPS Take 650 mg by mouth every 6 (six) hours as needed (pain).    Alirocumab (PRALUENT) 75 MG/ML SOAJ Inject 1 pen into the skin every 14 (fourteen) days. (Patient taking differently: Inject 75 mg into the skin every 14 (fourteen) days.)    amLODipine (NORVASC) 10 MG tablet Take 1 tablet (10 mg total) by mouth daily. (Patient taking differently: Take 10 mg by mouth every evening.)    aspirin 81 MG EC tablet Take 1 tablet (81 mg total) by mouth daily. Swallow whole.    calcium carbonate (TUMS - DOSED IN MG ELEMENTAL CALCIUM) 500 MG chewable tablet Chew 1 tablet by mouth daily as needed for indigestion or heartburn.    cyanocobalamin (,VITAMIN B-12,) 1000 MCG/ML injection Inject 1 mL (1,000 mcg total) into the muscle every 30 (thirty) days.    D3-50 1.25 MG (50000 UT) capsule TAKE 1 CAPSULE BY MOUTH ONE TIME PER WEEK (Patient taking differently: Take 50,000 Units by mouth every Tuesday.)    famotidine (PEPCID) 20 MG tablet Take 1 tablet (20 mg total) by mouth 2 (two) times daily.    furosemide (LASIX) 20 MG tablet TAKE 1 TABLET (20 MG TOTAL) BY MOUTH DAILY. TAKING EVERY DAY FOR SWELLING (Patient taking differently: Take 20 mg by mouth daily as needed for edema.)    latanoprost (XALATAN) 0.005 % ophthalmic solution Place 1 drop into both eyes at bedtime.    loratadine (CLARITIN) 10 MG tablet Take 10 mg by mouth daily as needed for allergies.    OVER THE COUNTER MEDICATION Apply 1 application. topically daily as needed (knee pain). Hempvana pain relief cream    ticagrelor (BRILINTA) 90 MG TABS tablet Take 1 tablet (90 mg total)  by mouth 2 (two) times daily.    ticagrelor (BRILINTA) 90 MG TABS tablet Take 90 mg by mouth 2 (two) times daily. (Patient not taking: Reported on 07/30/2021) 07/16/2021: Alternate Route: BY MOUTH. Med Classification: Hematological Agents   valsartan (DIOVAN) 80 MG tablet Take 2 tablets (160 mg total) by mouth daily. If systolic BP greater than 140 mmHG give 160 mg by mouth daily, if systolic BP between 120-140 mmHG give 80 mg by mouth daily    vitamin B-12 (CYANOCOBALAMIN) 1000 MCG tablet Take 1,000 mcg by mouth every evening.    vortioxetine HBr (TRINTELLIX) 10 MG TABS tablet Take 1 tablet (10 mg total) by mouth daily. (Patient taking differently: Take 10 mg by mouth every evening.)    No facility-administered encounter medications on file as of 09/27/2021.    Reviewed chart for medication changes ahead of medication coordination call.  No OVs, Consults, or hospital visits since last care coordination call/Pharmacist visit.   No medication changes indicated OR if recent visit, treatment plan here.  BP Readings from Last 3 Encounters:  07/23/21 (!) 176/82  07/16/21 (!) 176/74  07/06/21 (!) 187/74    Lab Results  Component Value Date   HGBA1C 5.0 07/05/2021     Patient obtains medications through Adherence Packaging  30 Days   Last adherence delivery included:  Valsartan 80mg  1-2 tabs daily as directed according to systolic BP (Bottles) Trintellix  10mg  1 BT Furosemide 20mg - 1 tab daily (Bottles) Amlodipine 10mg  1 B Vitamin D3 50,000units 1 BT on Tuesdays Brilinta 90mg  1 at B and 1 at BT Famotidine 20mg  1 at B and 1 BT Asprin 81 1 at BT  Patient declined (meds) last month Unable to reach pt  Patient is due for next adherence delivery on: 10/09/21. Called patient and reviewed medications and coordinated delivery.  This delivery to include: Valsartan 80mg  1-2 tabs daily as directed according to systolic BP (Bottles) Trintellix 10mg  1 BT Furosemide 20mg - 1 tab daily  (Bottles) Amlodipine 10mg  1 B Vitamin D3 50,000units 1 BT on Tuesdays Brilinta 90mg  1 at B and 1 at BT Famotidine 20mg  1 at B and 1 BT  Patient declined the following medications  Asprin 81-Per Neurology notes On 10/05/2021, stop aspirin and continue Brilinta 90mg  twice daily alone. Praluent 75mg - D/C due to intolerance   Patient needs refills -Request Sent  Trintellix 10mg   Brilinta 90mg   Confirmed delivery date of 10/09/21, advised patient that pharmacy will contact them the morning of delivery.  , CMA Clinical Pharmacist Assistant  [phone]

## 2021-09-27 NOTE — Telephone Encounter (Signed)
Compliant on meds, DC ASA per Neuro note

## 2021-10-01 ENCOUNTER — Other Ambulatory Visit: Payer: Self-pay | Admitting: Legal Medicine

## 2021-10-01 DIAGNOSIS — F331 Major depressive disorder, recurrent, moderate: Secondary | ICD-10-CM

## 2021-10-01 DIAGNOSIS — R5383 Other fatigue: Secondary | ICD-10-CM

## 2021-10-11 DIAGNOSIS — N183 Chronic kidney disease, stage 3 unspecified: Secondary | ICD-10-CM | POA: Diagnosis not present

## 2021-10-11 DIAGNOSIS — I350 Nonrheumatic aortic (valve) stenosis: Secondary | ICD-10-CM | POA: Diagnosis not present

## 2021-10-11 DIAGNOSIS — I69393 Ataxia following cerebral infarction: Secondary | ICD-10-CM | POA: Diagnosis not present

## 2021-10-11 DIAGNOSIS — I6521 Occlusion and stenosis of right carotid artery: Secondary | ICD-10-CM | POA: Diagnosis not present

## 2021-10-11 DIAGNOSIS — K219 Gastro-esophageal reflux disease without esophagitis: Secondary | ICD-10-CM | POA: Diagnosis not present

## 2021-10-11 DIAGNOSIS — E782 Mixed hyperlipidemia: Secondary | ICD-10-CM | POA: Diagnosis not present

## 2021-10-11 DIAGNOSIS — I69322 Dysarthria following cerebral infarction: Secondary | ICD-10-CM | POA: Diagnosis not present

## 2021-10-11 DIAGNOSIS — F5101 Primary insomnia: Secondary | ICD-10-CM

## 2021-10-11 DIAGNOSIS — Z7902 Long term (current) use of antithrombotics/antiplatelets: Secondary | ICD-10-CM

## 2021-10-11 DIAGNOSIS — I129 Hypertensive chronic kidney disease with stage 1 through stage 4 chronic kidney disease, or unspecified chronic kidney disease: Secondary | ICD-10-CM | POA: Diagnosis not present

## 2021-10-11 DIAGNOSIS — I2510 Atherosclerotic heart disease of native coronary artery without angina pectoris: Secondary | ICD-10-CM | POA: Diagnosis not present

## 2021-10-11 DIAGNOSIS — H8110 Benign paroxysmal vertigo, unspecified ear: Secondary | ICD-10-CM | POA: Diagnosis not present

## 2021-10-11 DIAGNOSIS — K449 Diaphragmatic hernia without obstruction or gangrene: Secondary | ICD-10-CM

## 2021-10-11 DIAGNOSIS — F323 Major depressive disorder, single episode, severe with psychotic features: Secondary | ICD-10-CM

## 2021-10-11 DIAGNOSIS — H409 Unspecified glaucoma: Secondary | ICD-10-CM | POA: Diagnosis not present

## 2021-10-11 DIAGNOSIS — I447 Left bundle-branch block, unspecified: Secondary | ICD-10-CM

## 2021-10-11 DIAGNOSIS — I739 Peripheral vascular disease, unspecified: Secondary | ICD-10-CM | POA: Diagnosis not present

## 2021-10-16 ENCOUNTER — Telehealth: Payer: Self-pay | Admitting: *Deleted

## 2021-10-16 NOTE — Chronic Care Management (AMB) (Signed)
  Care Coordination  Note  10/16/2021 Name: [name] MRN: [id number] DOB: 17-Aug-1940  [name] is a 81 y.o. year old female who is a primary care patient of [name], MD. I reached out to Harlene Ramus by phone today to offer care coordination services.      [name] was given information about Care Coordination services today including:  The Care Coordination services include support from the care team which includes your Nurse Coordinator, Clinical Social Worker, or Pharmacist.  The Care Coordination team is here to help remove barriers to the health concerns and goals most important to you. Care Coordination services are voluntary and the patient may decline or stop services at any time by request to their care team member.   Daughter [name] DPR on file  verbally agreed to assistance and services provided by the Care Coordination team today.  Follow up plan: Telephone appointment with care coordination team member scheduled for:10/17/21  [facility] Coordination Care Guide  Direct Dial: [phone]

## 2021-10-17 ENCOUNTER — Ambulatory Visit: Payer: Self-pay | Admitting: *Deleted

## 2021-10-17 ENCOUNTER — Encounter: Payer: Self-pay | Admitting: *Deleted

## 2021-10-17 DIAGNOSIS — I639 Cerebral infarction, unspecified: Secondary | ICD-10-CM

## 2021-10-17 NOTE — Patient Instructions (Signed)
Visit Information  Thank you for taking time to visit with me today. Please don't hesitate to contact me if I can be of assistance to you.   Following are the goals we discussed today:   Goals Addressed               This Visit's Progress     Seek in-home support (pt-stated)        Care Coordination Interventions:  Solution-Focused Strategies employed:  Active listening / Reflection utilized  Emotional Support Provided Problem Solving /Task Center strategies reviewed Caregiver stress acknowledged  Participation in support group encouraged  Consideration of in-home help encouraged : options discussed Discussed Health Care Power of Attorney Discussed caregiver resources and support:   Made referral to Care Guide   Continue to complete and pursue the insurance policy that covers in-home CNA support.  Consider talking with pt at PCP office about DNR and HCPOA Talk to [facility] rep about the "hospital indemnity" policy Take paperwork to PCP for completion (long term care policy claim)        Our next appointment is by telephone on 11/07/21 at 3pm  Please call the care guide team at [phone] if you need to cancel or reschedule your appointment.   If you are experiencing a Mental Health or Behavioral Health Crisis or need someone to talk to, please call 911   The patient verbalized understanding of instructions, educational materials, and care plan provided today and agreed to receive a mailed copy of patient instructions, educational materials, and care plan.   Telephone follow up appointment with care management team member scheduled for:11/07/21  Reece Levy MSW, LCSW Licensed Clinical Social Worker     Care Coordination [phone]

## 2021-10-17 NOTE — Patient Outreach (Signed)
  Care Coordination   Initial Visit Note   10/17/2021 Name: [name] MRN: [id number] DOB: 1940/11/28  [name] is a 81 y.o. year old female who sees Diane Miyamoto, MD for primary care. I spoke with  daughter of [name] by phone today  What matters to the patients health and wellness today?  In-home care support   Goals Addressed               This Visit's Progress     Seek in-home support (pt-stated)        Care Coordination Interventions:  Solution-Focused Strategies employed:  Active listening / Reflection utilized  Emotional Support Provided Problem Solving /Task Center strategies reviewed Caregiver stress acknowledged  Participation in support group encouraged  Consideration of in-home help encouraged : options discussed Discussed Health Care Power of Attorney Discussed caregiver resources and support:   Made referral to Care Guide   Continue to complete and pursue the insurance policy that covers in-home CNA support.  Consider talking with pt at PCP office about DNR and HCPOA Talk to [facility] rep about the "hospital indemnity" policy Take paperwork to PCP for completion (long term care policy claim)        SDOH assessments and interventions completed:   Yes SDOH Interventions Today    Flowsheet Row Most Recent Value  SDOH Interventions   Financial Strain Interventions Intervention Not Indicated  Housing Interventions Intervention Not Indicated  Social Connections Interventions Intervention Not Indicated  Transportation Interventions Intervention Not Indicated       Care Coordination Interventions Activated:  Yes Care Coordination Interventions:  Yes, provided  Follow up plan: Follow up call scheduled for 11/07/21  Encounter Outcome:  Pt. Visit Completed   [name] MSW, LCSW Licensed Clinical Social Worker Care Coordination    [phone]

## 2021-10-18 ENCOUNTER — Telehealth: Payer: Self-pay | Admitting: *Deleted

## 2021-10-18 ENCOUNTER — Telehealth: Payer: Self-pay

## 2021-10-18 NOTE — Telephone Encounter (Signed)
-----   Message from Blane Ohara, MD sent at 10/17/2021  7:42 PM EDT ----- Regarding: RE: Medication All antidepressants can increase risk of bleeding slightly.  I would recommend ask cardiology if they have a concern.  Please apologize for the delay in getting back to her. Dr Sedalia Muta   ----- Message ----- From: Humberto Leep, CMA Sent: 09/27/2021  11:01 AM EDT To: Precious Reel, CMA; Blane Ohara, MD Subject: Annell Greening: Medication                                  ----- Message ----- From: Roderic Scarce Sent: 09/27/2021  10:57 AM EDT To: [facility] Subject: Medication                                     Good morning,  Daughter [name] has concerns about her mom taking Trintellix. She has looked up that there is a risk of bleeing on this medication and with her mom having a HX of aneurysms and is on the [place] that she is concerned if mom should be on the Trintellix or if she needs something else.   Please advise  [name], [facility] Clinical Pharmacist Assistant  [phone]

## 2021-10-18 NOTE — Telephone Encounter (Signed)
Spoke with [name]. She VU however neurologist has been the one controlling all medications that can be blood thinners. They have been managing aneurysms. [name] will reach out to neuro.   [name] is unsure if [name] is to continue b12 injections. [name] has been trying to give oral b12 daily, as much as she can. [name] is unsure when last injection has been. Per documentation last injection was 07/23/21. Patient is complaining of fatigue. She does have upcoming appointment on 8/16.   Lorita Officer, CCMA 10/18/21 10:50 AM

## 2021-10-18 NOTE — Telephone Encounter (Signed)
[name] grateful for advisement. She will see what she has for patient and continue patient on b12.   Lorita Officer, CCMA 10/18/21 1:16 PM

## 2021-10-26 ENCOUNTER — Telehealth: Payer: Self-pay

## 2021-10-26 NOTE — Progress Notes (Signed)
Chronic Care Management Pharmacy Assistant   Name: [name]  MRN: [id number] DOB: [birth year]   Reason for Encounter: Medication Coordination for Upstream    Recent office visits:  None  Recent consult visits:  None  Hospital visits:  None  Medications: Outpatient Encounter Medications as of 10/26/2021  Medication Sig Note   Acetaminophen (TYLENOL) 325 MG CAPS Take 650 mg by mouth every 6 (six) hours as needed (pain).    Alirocumab (PRALUENT) 75 MG/ML SOAJ Inject 1 pen into the skin every 14 (fourteen) days. (Patient taking differently: Inject 75 mg into the skin every 14 (fourteen) days.)    amLODipine (NORVASC) 10 MG tablet Take 1 tablet (10 mg total) by mouth daily.    BRILINTA 90 MG TABS tablet TAKE ONE TABLET BY MOUTH EVERY MORNING and TAKE ONE TABLET BY MOUTH EVERYDAY AT BEDTIME    calcium carbonate (TUMS - DOSED IN MG ELEMENTAL CALCIUM) 500 MG chewable tablet Chew 1 tablet by mouth daily as needed for indigestion or heartburn.    cyanocobalamin (,VITAMIN B-12,) 1000 MCG/ML injection Inject 1 mL (1,000 mcg total) into the muscle every 30 (thirty) days.    D3-50 1.25 MG (50000 UT) capsule TAKE 1 CAPSULE BY MOUTH ONE TIME PER WEEK (Patient taking differently: Take 50,000 Units by mouth every Tuesday.)    famotidine (PEPCID) 20 MG tablet Take 1 tablet (20 mg total) by mouth 2 (two) times daily.    furosemide (LASIX) 20 MG tablet TAKE 1 TABLET (20 MG TOTAL) BY MOUTH DAILY. TAKING EVERY DAY FOR SWELLING (Patient taking differently: Take 20 mg by mouth daily as needed for edema.)    latanoprost (XALATAN) 0.005 % ophthalmic solution Place 1 drop into both eyes at bedtime.    loratadine (CLARITIN) 10 MG tablet Take 10 mg by mouth daily as needed for allergies.    OVER THE COUNTER MEDICATION Apply 1 application. topically daily as needed (knee pain). Hempvana pain relief cream    ticagrelor (BRILINTA) 90 MG TABS tablet Take 90 mg by mouth 2 (two) times daily. 07/16/2021: Alternate  Route: BY MOUTH. Med Classification: Hematological Agents   TRINTELLIX 10 MG TABS tablet TAKE ONE TABLET BY MOUTH EVERYDAY AT BEDTIME    valsartan (DIOVAN) 80 MG tablet Take 2 tablets (160 mg total) by mouth daily. If systolic BP greater than 140 mmHG give 160 mg by mouth daily, if systolic BP between 120-140 mmHG give 80 mg by mouth daily    vitamin B-12 (CYANOCOBALAMIN) 1000 MCG tablet Take 1,000 mcg by mouth every evening.    No facility-administered encounter medications on file as of 10/26/2021.    Reviewed chart for medication changes ahead of medication coordination call.  No OVs, Consults, or hospital visits since last care coordination call/Pharmacist visit. (If appropriate, list visit date, provider name)  No medication changes indicated OR if recent visit, treatment plan here.  BP Readings from Last 3 Encounters:  07/23/21 (!) 176/82  07/16/21 (!) 176/74  07/06/21 (!) 187/74    Lab Results  Component Value Date   HGBA1C 5.0 07/05/2021     Patient obtains medications through Adherence Packaging  30 Days   Last adherence delivery included:  Valsartan 80mg  1-2 tabs daily as directed according to systolic BP (Bottles) Trintellix 10mg  1 BT Furosemide 20mg - 1 tab daily (Bottles) Amlodipine 10mg  1 B Vitamin D3 50,000units 1 BT on Tuesdays Brilinta 90mg  1 at B and 1 at BT Famotidine 20mg  1 at B and 1 BT  Patient  declined (meds) last month  Asprin 81-Per Neurology notes On 10/05/2021, stop aspirin and continue Brilinta 90mg  twice daily alone. Praluent 75mg - D/C due to intolerance   Patient is due for next adherence delivery on: 11/07/21. Called patient and reviewed medications and coordinated delivery.  This delivery to include: Valsartan 80mg  1-2 tabs daily as directed according to systolic BP (Bottles) Trintellix 10mg  1 BT Furosemide 20mg - 1 tab daily (Bottles) Amlodipine 10mg  1 B Vitamin D3 50,000units 1 BT on Tuesdays Brilinta 90mg  1 at B and 1 at BT Famotidine 20mg   1 at B and 1 BT  Patient declined the following medications  Praluent 75mg - D/C due to intolerance   Patient needs refills  None  Confirmed delivery date of 11/07/21, advised patient that pharmacy will contact them the morning of delivery.   , CMA Clinical Pharmacist Assistant  [phone]

## 2021-10-30 ENCOUNTER — Ambulatory Visit: Payer: Medicare HMO | Admitting: Legal Medicine

## 2021-11-01 ENCOUNTER — Telehealth: Payer: Self-pay

## 2021-11-01 NOTE — Progress Notes (Signed)
Care Gap(s) Not Met that Need to be Addressed:   Controlling High Blood Pressure   Action Taken: 11/01/21 1st attempt called for BP reading. No answer. 11/02/21 2nd attempt, spoke with daughter, Santiago Glad and last BP was 152/77   Follow Up: 11/07/21 with Dr. Henrene Pastor

## 2021-11-06 ENCOUNTER — Ambulatory Visit: Payer: Self-pay | Admitting: *Deleted

## 2021-11-06 NOTE — Patient Instructions (Signed)
Visit Information  Thank you for taking time to visit with me today. Please don't hesitate to contact me if I can be of assistance to you.   Following are the goals we discussed today:   Goals Addressed             This Visit's Progress    To seek additional support/resources to aide in care       Care Coordination Interventions:  Solution-Focused Strategies employed:  Mindfulness or Relaxation training provided Active listening / Reflection utilized  Emotional Support Provided Problem Solving /Task Center strategies reviewed Caregiver stress acknowledged  Consideration of in-home help encouraged : options discussed Discussed Health Care Power of Attorney and encouraged daughter to assist patient with getting this and other legal documents completed (Durable POA, Living Will,etc)  Discussed caregiver resources and support: will assist with determining what the [employer] policy will cover and provide in the home for caregiver support services  Encouraged daughter to find ways for self care, advocating/delegating actions to others as she can (Step-children to oversee pt's husband's needs more)        Our next appointment is by telephone on 11/09/21 at 10  Please call the care guide team at [phone] if you need to cancel or reschedule your appointment.   If you are experiencing a Mental Health or Behavioral Health Crisis or need someone to talk to, please call 911   The patient verbalized understanding of instructions, educational materials, and care plan provided today and DECLINED offer to receive copy of patient instructions, educational materials, and care plan.   Telephone follow up appointment with care management team member scheduled for:11/09/21  Reece Levy MSW, LCSW Licensed Clinical Social Worker      [phone]

## 2021-11-06 NOTE — Patient Outreach (Signed)
  Care Coordination   Follow Up Visit Note   11/06/2021 Name: [name] MRN: [id number] DOB: [birth year]  [name] is a 81 y.o. year old female who sees [name], MD for primary care. I spoke with  [name], daughter of pt, [name] by phone today  What matters to the patients health and wellness today?  Get help in home.    Goals Addressed             This Visit's Progress    To seek additional support/resources to aide in care       Care Coordination Interventions:  Solution-Focused Strategies employed:  Mindfulness or Relaxation training provided Active listening / Reflection utilized  Emotional Support Provided Problem Solving /Task Center strategies reviewed Caregiver stress acknowledged  Consideration of in-home help encouraged : options discussed Discussed Health Care Power of Attorney and encouraged daughter to assist patient with getting this and other legal documents completed (Durable POA, Living Will,etc)  Discussed caregiver resources and support: will assist with determining what the [employer] policy will cover and provide in the home for caregiver support services  Encouraged daughter to find ways for self care, advocating/delegating actions to others as she can (Step-children to oversee pt's husband's needs more)        SDOH assessments and interventions completed:  Yes     Care Coordination Interventions Activated:  Yes  Care Coordination Interventions:  Yes, provided   Follow up plan: Follow up call scheduled for 11/09/21    Encounter Outcome:  Pt. Visit Completed   Reece Levy MSW, LCSW Licensed Clinical Social Worker      [phone]

## 2021-11-07 ENCOUNTER — Encounter: Payer: Medicare HMO | Admitting: *Deleted

## 2021-11-07 ENCOUNTER — Ambulatory Visit (INDEPENDENT_AMBULATORY_CARE_PROVIDER_SITE_OTHER): Payer: Medicare HMO | Admitting: Legal Medicine

## 2021-11-07 ENCOUNTER — Encounter: Payer: Self-pay | Admitting: Legal Medicine

## 2021-11-07 VITALS — BP 140/90 | HR 71 | Temp 99.1°F | Resp 18 | Ht 60.0 in | Wt 155.0 lb

## 2021-11-07 DIAGNOSIS — E559 Vitamin D deficiency, unspecified: Secondary | ICD-10-CM

## 2021-11-07 DIAGNOSIS — I69393 Ataxia following cerebral infarction: Secondary | ICD-10-CM

## 2021-11-07 DIAGNOSIS — E782 Mixed hyperlipidemia: Secondary | ICD-10-CM

## 2021-11-07 DIAGNOSIS — E538 Deficiency of other specified B group vitamins: Secondary | ICD-10-CM | POA: Diagnosis not present

## 2021-11-07 DIAGNOSIS — N1831 Chronic kidney disease, stage 3a: Secondary | ICD-10-CM | POA: Diagnosis not present

## 2021-11-07 DIAGNOSIS — Z6830 Body mass index (BMI) 30.0-30.9, adult: Secondary | ICD-10-CM

## 2021-11-07 NOTE — Progress Notes (Signed)
Subjective:  Patient ID: [name], female    DOB: [birth year]  Age: 81 y.o. MRN: [id number]  Chief Complaint  Patient presents with   Consult    HPI  Patient presents for follow up of hypertension.  Patient tolerating Amlodipine 10 mg daily, Valsartan  well without side effects. Patient is working on maintaining diet and exercise regimen and follows up as directed.   Patient presents with hyperlipidemia.  Compliance with treatment has been good; patient takes medicines as directed, maintains low cholesterol diet, follows up as directed, and maintains exercise regimen.  Patient is using Praluent 1 pen every 14 days without problems.   She is having problems with her ADLS and IADLS.  She needs assistance with transfers, washing , bathing, some continence.  Needs physical therapy, She needs home health for chores at hoe both patient and husband are disabled. Current Outpatient Medications on File Prior to Visit  Medication Sig Dispense Refill   D3-50 1.25 MG (50000 UT) capsule TAKE 1 CAPSULE BY MOUTH ONE TIME PER WEEK (Patient taking differently: Take 50,000 Units by mouth every Tuesday.) 12 capsule 3   famotidine (PEPCID) 20 MG tablet Take 1 tablet (20 mg total) by mouth 2 (two) times daily. 180 tablet 2   furosemide (LASIX) 20 MG tablet TAKE 1 TABLET (20 MG TOTAL) BY MOUTH DAILY. TAKING EVERY DAY FOR SWELLING (Patient taking differently: Take 20 mg by mouth daily as needed for edema.) 90 tablet 2   latanoprost (XALATAN) 0.005 % ophthalmic solution Place 1 drop into both eyes at bedtime.     loratadine (CLARITIN) 10 MG tablet Take 10 mg by mouth daily as needed for allergies.     OVER THE COUNTER MEDICATION Apply 1 application. topically daily as needed (knee pain). Hempvana pain relief cream     ticagrelor (BRILINTA) 90 MG TABS tablet Take 90 mg by mouth 2 (two) times daily.     TRINTELLIX 10 MG TABS tablet TAKE ONE TABLET BY MOUTH EVERYDAY AT BEDTIME 90 tablet 1   valsartan (DIOVAN) 80  MG tablet Take 2 tablets (160 mg total) by mouth daily. If systolic BP greater than 140 mmHG give 160 mg by mouth daily, if systolic BP between 120-140 mmHG give 80 mg by mouth daily (Patient taking differently: Take 80 mg by mouth daily. If systolic BP greater than 140 mmHG give 80 mg by mouth daily, if systolic BP between 120-140 mmHG give 40 mg by mouth daily) 180 tablet 3   vitamin B-12 (CYANOCOBALAMIN) 1000 MCG tablet Take 1,000 mcg by mouth every evening.     Acetaminophen (TYLENOL) 325 MG CAPS Take 650 mg by mouth every 6 (six) hours as needed (pain).     Alirocumab (PRALUENT) 75 MG/ML SOAJ Inject 1 pen into the skin every 14 (fourteen) days. (Patient taking differently: Inject 75 mg into the skin every 14 (fourteen) days.) 6 mL 3   amLODipine (NORVASC) 10 MG tablet Take 1 tablet (10 mg total) by mouth daily. 90 tablet 1   cyanocobalamin (,VITAMIN B-12,) 1000 MCG/ML injection Inject 1 mL (1,000 mcg total) into the muscle every 30 (thirty) days. (Patient not taking: Reported on 11/07/2021) 30 mL 2   No current facility-administered medications on file prior to visit.   Past Medical History:  Diagnosis Date   Benign paroxysmal vertigo, unspecified ear 06/07/2019   CKD (chronic kidney disease), stage III (HCC)    GERD (gastroesophageal reflux disease)    Glaucoma    Past Surgical History:  Procedure Laterality Date   ABDOMINAL HYSTERECTOMY     BACK SURGERY     cataract surgery     COLONOSCOPY N/A 08/03/2015   Procedure: COLONOSCOPY;  Surgeon: Carman Ching, MD;  Location[facility] END[facility]COPY;  Service: Endoscopy;  Laterality: N/A;   ESOPHAGOGASTRODUODENOSCOPY N/A 07/31/2015   Procedure: ESOPHAGOGASTRODUODENOSCOPY (EGD);  Surgeon: Carman Ching, MD;  Location: [facility] ENDOSCOPY;  Service: Endoscopy;  Laterality: N/A;   IR ANGIO INTRA EXTRACRAN SEL COM CAROTID INNOMINATE BILAT MOD SED  06/01/2021   IR ANGIO VERTEBRAL SEL SUBCLAVIAN INNOMINATE UNI L MOD SED  06/01/2021   IR RADIOLOGIST EVAL & MGMT  04/26/2021    OOPHORECTOMY     TOTAL HIP ARTHROPLASTY     URETER SURGERY      Family History  Problem Relation Age of Onset   Hypertension Mother    Hypertension Father    Heart attack Father    Hypertension Brother    Diabetes Brother    Social History   Socioeconomic History   Marital status: Married    Spouse name: Not on file   Number of children: Not on file   Years of education: Not on file   Highest education level: Not on file  Occupational History   Not on file  Tobacco Use   Smoking status: Never   Smokeless tobacco: Never  Vaping Use   Vaping Use: Never used  Substance and Sexual Activity   Alcohol use: No   Drug use: No   Sexual activity: Not Currently  Other Topics Concern   Not on file  Social History Narrative   Not on file   Social Determinants of Health   Financial Resource Strain: Low Risk  (11/06/2021)   Overall Financial Resource Strain (CARDIA)    Difficulty of Paying Living Expenses: Not hard at all  Food Insecurity: No Food Insecurity (11/06/2021)   Hunger Vital Sign    Worried About Running Out of Food in the Last Year: Never true    Ran Out of Food in the Last Year: Never true  Transportation Needs: No Transportation Needs (11/06/2021)   PRAPARE - Administrator, Civil Service (Medical): No    Lack of Transportation (Non-Medical): No  Physical Activity: Insufficiently Active (07/12/2021)   Exercise Vital Sign    Days of Exercise per Week: 2 days    Minutes of Exercise per Session: 30 min  Stress: Not on file  Social Connections: Moderately Integrated (11/06/2021)   Social Connection and Isolation Panel [NHANES]    Frequency of Communication with Friends and Family: Never    Frequency of Social Gatherings with Friends and Family: More than three times a week    Attends Religious Services: Never    Database administrator or Organizations: Yes    Attends Banker Meetings: Never    Marital Status: Married    Review of  Systems  Constitutional:  Negative for chills, fatigue and fever.  HENT:  Negative for congestion, ear pain and sore throat.   Eyes:  Negative for visual disturbance.  Respiratory:  Negative for cough and shortness of breath.   Cardiovascular:  Negative for chest pain and palpitations.  Gastrointestinal:  Negative for abdominal pain, constipation, diarrhea, nausea and vomiting.  Endocrine: Negative for polydipsia, polyphagia and polyuria.  Genitourinary:  Negative for difficulty urinating and dysuria.  Musculoskeletal:  Negative for arthralgias, back pain and myalgias.  Skin:  Negative for rash.  Neurological:  Negative for headaches.  Psychiatric/Behavioral:  Negative  for dysphoric mood. The patient is not nervous/anxious.      Objective:  BP (!) 140/90   Pulse 71   Temp 99.1 F (37.3 C)   Resp 18   Ht 5' (1.524 m)   Wt 155 lb (70.3 kg)   SpO2 96%   BMI 30.27 kg/m      11/07/2021    1:57 PM 07/23/2021   10:11 AM 07/23/2021    9:55 AM  BP/Weight  Systolic BP 140 176 184  Diastolic BP 90 82 90  Wt. (Lbs) 155  154  BMI 30.27 kg/m2  30.08 kg/m2    Physical Exam Vitals reviewed.  Constitutional:      General: She is not in acute distress.    Appearance: Normal appearance.  HENT:     Head: Normocephalic.     Right Ear: Tympanic membrane normal.     Left Ear: Tympanic membrane normal.     Nose: Nose normal.     Mouth/Throat:     Mouth: Mucous membranes are moist.     Pharynx: Oropharynx is clear.  Eyes:     Extraocular Movements: Extraocular movements intact.     Conjunctiva/sclera: Conjunctivae normal.     Pupils: Pupils are equal, round, and reactive to light.  Cardiovascular:     Rate and Rhythm: Normal rate and regular rhythm.     Pulses: Normal pulses.     Heart sounds: Normal heart sounds. No murmur heard.    No gallop.  Pulmonary:     Effort: Pulmonary effort is normal. No respiratory distress.     Breath sounds: Normal breath sounds. No wheezing.   Abdominal:     General: Abdomen is flat. Bowel sounds are normal. There is no distension.     Palpations: Abdomen is soft.     Tenderness: There is no abdominal tenderness.  Musculoskeletal:        General: Normal range of motion.     Cervical back: Normal range of motion and neck supple.     Right lower leg: No edema.     Left lower leg: No edema.  Skin:    General: Skin is warm and dry.     Capillary Refill: Capillary refill takes less than 2 seconds.  Neurological:     General: No focal deficit present.     Mental Status: She is alert and oriented to person, place, and time. Mental status is at baseline.     Comments: Drags right leg from stroke  Psychiatric:        Mood and Affect: Mood normal.        Thought Content: Thought content normal.        Judgment: Judgment normal.         Lab Results  Component Value Date   WBC 8.0 11/07/2021   HGB 13.6 11/07/2021   HCT 42.1 11/07/2021   PLT 360 11/07/2021   GLUCOSE 113 (H) 11/07/2021   CHOL 287 (H) 11/07/2021   TRIG 439 (H) 11/07/2021   HDL 38 (L) 11/07/2021   LDLCALC 164 (H) 11/07/2021   ALT 15 11/07/2021   AST 17 11/07/2021   NA 141 11/07/2021   K 4.1 11/07/2021   CL 104 11/07/2021   CREATININE 1.16 (H) 11/07/2021   BUN 28 (H) 11/07/2021   CO2 20 11/07/2021   TSH 1.210 11/07/2021   INR 1.0 06/01/2021   HGBA1C 5.0 07/05/2021      Assessment & Plan:   Problem List Items Addressed This  Visit       Nervous and Auditory   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     Genitourinary   Chronic kidney disease, stage 3a (HCC)   Relevant Orders   Comprehensive metabolic panel (Completed)   CBC with Differential/Platelet (Completed) Patient was evaluated for stage 3a.  It is important to maintain good Blood Pressure control . Keep on low protein diet and remain with adequate hydration to maintain function.      Other    Mixed hyperlipidemia   Relevant Orders   Lipid panel (Completed)   TSH (Completed) AN INDIVIDUAL CARE PLAN for hyperlipidemia/ cholesterol was established and reinforced today.  The patient's status was assessed using clinical findings on exam, lab and other diagnostic tests. The patient's disease status was assessed based on evidence-based guidelines and found to be fair controlled. MEDICATIONS were reviewed. SELF MANAGEMENT GOALS have been discussed and patient's success at attaining the goal of low cholesterol was assessed. RECOMMENDATION given include regular exercise 3 days a week and low cholesterol/low fat diet. CLINICAL SUMMARY including written plan to identify barriers unique to the patient due to social or economic  reasons was discussed.    BMI 30.0-30.9,adult - Primary An individualize plan was formulated for obesity using patient history and physical exam to encourage weight loss.  An evidence based program was formulated.  Patient is to cut portion size with meals and to plan physical exercise 3 days a week at least 20 minutes.  Weight watchers and other programs are helpful.  Planned amount of weight loss 10 lbs.    Other Visit Diagnoses     Vitamin D deficiency       Relevant Orders   VITAMIN D 25 Hydroxy (Vit-D Deficiency, Fractures) (Completed) Check vitamin d   B12 deficiency       Relevant Orders   Vitamin B12 (Completed) Check for vitamin B12 deficiency     .       Follow-up: Return in about 3 months (around 02/07/2022).  An After Visit Summary was printed and given to the patient.  [name], MD [facility] [phone]

## 2021-11-08 LAB — CBC WITH DIFFERENTIAL/PLATELET
Basophils Absolute: 0 10*3/uL (ref 0.0–0.2)
Basos: 0 %
EOS (ABSOLUTE): 0.1 10*3/uL (ref 0.0–0.4)
Eos: 2 %
Hematocrit: 42.1 % (ref 34.0–46.6)
Hemoglobin: 13.6 g/dL (ref 11.1–15.9)
Immature Grans (Abs): 0 10*3/uL (ref 0.0–0.1)
Immature Granulocytes: 0 %
Lymphocytes Absolute: 1.5 10*3/uL (ref 0.7–3.1)
Lymphs: 18 %
MCH: 30.1 pg (ref 26.6–33.0)
MCHC: 32.3 g/dL (ref 31.5–35.7)
MCV: 93 fL (ref 79–97)
Monocytes Absolute: 0.7 10*3/uL (ref 0.1–0.9)
Monocytes: 9 %
Neutrophils Absolute: 5.6 10*3/uL (ref 1.4–7.0)
Neutrophils: 71 %
Platelets: 360 10*3/uL (ref 150–450)
RBC: 4.52 x10E6/uL (ref 3.77–5.28)
RDW: 12.7 % (ref 11.7–15.4)
WBC: 8 10*3/uL (ref 3.4–10.8)

## 2021-11-08 LAB — LIPID PANEL
Chol/HDL Ratio: 7.6 ratio — ABNORMAL HIGH (ref 0.0–4.4)
Cholesterol, Total: 287 mg/dL — ABNORMAL HIGH (ref 100–199)
HDL: 38 mg/dL — ABNORMAL LOW (ref >39)
LDL Chol Calc (NIH): 164 mg/dL — ABNORMAL HIGH (ref 0–99)
Triglycerides: 439 mg/dL — ABNORMAL HIGH (ref 0–149)
VLDL Cholesterol Cal: 85 mg/dL — ABNORMAL HIGH (ref 5–40)

## 2021-11-08 LAB — COMPREHENSIVE METABOLIC PANEL
ALT: 15 IU/L (ref 0–32)
AST: 17 IU/L (ref 0–40)
Albumin/Globulin Ratio: 1.4 (ref 1.2–2.2)
Albumin: 4.6 g/dL (ref 3.7–4.7)
Alkaline Phosphatase: 141 IU/L — ABNORMAL HIGH (ref 44–121)
BUN/Creatinine Ratio: 24 (ref 12–28)
BUN: 28 mg/dL — ABNORMAL HIGH (ref 8–27)
Bilirubin Total: 0.2 mg/dL (ref 0.0–1.2)
CO2: 20 mmol/L (ref 20–29)
Calcium: 11.2 mg/dL — ABNORMAL HIGH (ref 8.7–10.3)
Chloride: 104 mmol/L (ref 96–106)
Creatinine, Ser: 1.16 mg/dL — ABNORMAL HIGH (ref 0.57–1.00)
Globulin, Total: 3.3 g/dL (ref 1.5–4.5)
Glucose: 113 mg/dL — ABNORMAL HIGH (ref 70–99)
Potassium: 4.1 mmol/L (ref 3.5–5.2)
Sodium: 141 mmol/L (ref 134–144)
Total Protein: 7.9 g/dL (ref 6.0–8.5)
eGFR: 47 mL/min/{1.73_m2} — ABNORMAL LOW (ref >59)

## 2021-11-08 LAB — VITAMIN D 25 HYDROXY (VIT D DEFICIENCY, FRACTURES): Vit D, 25-Hydroxy: 41.9 ng/mL (ref 30.0–100.0)

## 2021-11-08 LAB — VITAMIN B12: Vitamin B-12: 704 pg/mL (ref 232–1245)

## 2021-11-08 LAB — TSH: TSH: 1.21 u[IU]/mL (ref 0.450–4.500)

## 2021-11-08 LAB — CARDIOVASCULAR RISK ASSESSMENT

## 2021-11-08 NOTE — Progress Notes (Signed)
Glucose 113, kidney tests 3a, calcium remains high 11.2, kidney tests normal, triglycerides 439 very high, LDL cholesterol 164, very high- has she been getting praulent regularly, cholesterol remains very high, CBC normal, TSH 1.21 normal, B12 704 normal, Vitamin D 41.9 normal,  lp

## 2021-11-09 ENCOUNTER — Encounter: Payer: Self-pay | Admitting: *Deleted

## 2021-11-09 ENCOUNTER — Telehealth: Payer: Self-pay | Admitting: *Deleted

## 2021-11-09 NOTE — Patient Outreach (Signed)
  Care Coordination   11/09/2021 Name: [name] MRN: [id number] DOB: 1940-09-23   Care Coordination Outreach Attempts:  An unsuccessful telephone outreach was attempted today to offer the patient information about available care coordination services as a benefit of their health plan.   Follow Up Plan:  Additional outreach attempts will be made to offer the patient care coordination information and services.   Encounter Outcome:  No Answer  Care Coordination Interventions Activated:  No   Care Coordination Interventions:  No, not indicated    Reece Levy MSW, LCSW Licensed Clinical Social Worker      [phone]

## 2021-11-12 ENCOUNTER — Encounter: Payer: Self-pay | Admitting: *Deleted

## 2021-11-13 ENCOUNTER — Telehealth: Payer: Self-pay | Admitting: *Deleted

## 2021-11-13 ENCOUNTER — Telehealth: Payer: Self-pay

## 2021-11-13 NOTE — Patient Outreach (Addendum)
  Care Coordination   11/13/2021 Name: [name] MRN: [id number] DOB: [birth year]   Care Coordination Outreach Attempts:  A second unsuccessful outreach was attempted today to offer the patient with information about available care coordination services as a benefit of their health plan.     Follow Up Plan:  Additional outreach attempts will be made to offer the patient care coordination information and services.   Encounter Outcome:  No Answer  Care Coordination Interventions Activated:  No   Care Coordination Interventions:  No, not indicated    Reece Levy MSW, LCSW Licensed Clinical Social Worker      [phone]

## 2021-11-13 NOTE — Telephone Encounter (Signed)
Patients daughter informed of lab results. [name] states her mother does get Praluent regularly, however she questions why the Praluent positively effects both HDL and LDL but elevates Triglycerides? She states that she herself takes Repatha and she herself has this same issue.

## 2021-11-14 ENCOUNTER — Ambulatory Visit: Payer: Self-pay | Admitting: *Deleted

## 2021-11-14 ENCOUNTER — Telehealth: Payer: Self-pay

## 2021-11-14 NOTE — Progress Notes (Signed)
Care Gap(s) Not Met that Need to be Addressed:   Controlling High Blood Pressure   Action Taken:  Last BP was 152/77. Follow up with PCP scheduled for 11/07/21 and BP 140/90. Pt has CKD and is 81years old and according to protocols pt should be exempt for these reasons for this care gap.    Follow Up: 02/12/22 with Dr. Henrene Pastor

## 2021-11-15 ENCOUNTER — Ambulatory Visit: Payer: Medicare HMO | Admitting: *Deleted

## 2021-11-15 NOTE — Telephone Encounter (Signed)
I called patient and explained about Praluent.

## 2021-11-15 NOTE — Patient Outreach (Signed)
  Care Coordination   Follow Up Visit Note   11/15/2021 Name: ZRIYAH KOPPLIN MRN: [id number] DOB: 10/28/1940  KALSEY LULL is a 81 y.o. year old female who sees [name], MD for primary care. I spoke with  [name], daughter of AYLEE LITTRELL by phone today.  What matters to the patients health and wellness today?  In home support.  CSW spoke with [employer] rep to get clarification on the policy pt has. The policy will cover/reimburse for [facility], PT, aide services at a certain dollar amount per day with a maximum payout (360 days for RN and 60 days for aide). CSW was able to confirm the policy does cover/reimburse once claim submitted and approved without any stipulation of pt's medical coverage of these services.  Assisting family with getting HH agency to provide invoices, etc  for claim processing/determination.   Goals Addressed   None     SDOH assessments and interventions completed:  Yes     Care Coordination Interventions Activated:  Yes  Care Coordination Interventions:  Yes, provided   Follow up plan: Follow up call scheduled for 11/19/21    Encounter Outcome:  Pt. Visit Completed

## 2021-11-15 NOTE — Patient Instructions (Signed)
Visit Information  Thank you for taking time to visit with me today. Please don't hesitate to contact me if I can be of assistance to you.   Following are the goals we discussed today:   Goals Addressed               This Visit's Progress     Seek in-home support (pt-stated)        Care Coordination Interventions:  Solution-Focused Strategies employed:  Active listening / Reflection utilized  Emotional Support Provided Problem Solving /Task Center strategies reviewed Caregiver stress acknowledged  Participation in support group encouraged  Consideration of in-home help encouraged : options discussed Discussed Health Care Power of Attorney Discussed caregiver resources and support:   Made referral to Care Guide   Continue to complete and pursue the insurance policy that covers in-home CNA support.  Consider talking with pt at PCP office about DNR and HCPOA Talk to [facility] rep about the "hospital indemnity" policy Email paperwork to CSW for in-home care insurance claim processing         Our next appointment is by telephone on 11/19/21  Please call the care guide team at [phone] if you need to cancel or reschedule your appointment.   If you are experiencing a Mental Health or Behavioral Health Crisis or need someone to talk to, please call 911   The patient verbalized understanding of instructions, educational materials, and care plan provided today and DECLINED offer to receive copy of patient instructions, educational materials, and care plan.   Telephone follow up appointment with care management team member scheduled for:11/19/21  Reece Levy MSW, LCSW Licensed Clinical Social Worker      [phone]

## 2021-11-15 NOTE — Patient Outreach (Signed)
  Care Coordination   Follow Up Visit Note   11/15/2021 Name: DEITRA CRAINE MRN: [id number] DOB: 1940-06-24  CARIE KAPUSCINSKI is a 81 y.o. year old female who sees [name], MD for primary care. I spoke with  [name], daughter of GIA LUSHER by phone today  What matters to the patients health and wellness today?  Home support/caregiver support.    Goals Addressed               This Visit's Progress     Seek in-home support (pt-stated)        Care Coordination Interventions:  Solution-Focused Strategies employed:  Active listening / Reflection utilized  Emotional Support Provided Problem Solving /Task Center strategies reviewed Caregiver stress acknowledged  Participation in support group encouraged  Consideration of in-home help encouraged : options discussed Discussed Health Care Power of Attorney Discussed caregiver resources and support:   Made referral to Care Guide   Continue to complete and pursue the insurance policy that covers in-home CNA support.  Consider talking with pt at PCP office about DNR and HCPOA Talk to [facility] rep about the "hospital indemnity" policy Email paperwork to CSW for in-home care insurance claim processing         SDOH assessments and interventions completed:  Yes     Care Coordination Interventions Activated:  Yes  Care Coordination Interventions:  Yes, provided   Follow up plan: Follow up call scheduled for 11/19/21    Encounter Outcome:  Pt. Scheduled   Reece Levy MSW, LCSW Licensed Clinical Social Worker      [phone]

## 2021-11-19 ENCOUNTER — Telehealth: Payer: Self-pay | Admitting: *Deleted

## 2021-11-19 ENCOUNTER — Encounter: Payer: Self-pay | Admitting: *Deleted

## 2021-11-19 NOTE — Patient Outreach (Signed)
  Care Coordination   11/19/2021 Name: [name] MRN: [id number] DOB: 1940-10-18   Care Coordination Outreach Attempts:  An unsuccessful telephone outreach was attempted today to offer the patient information about available care coordination services as a benefit of their health plan.   Follow Up Plan:  Additional outreach attempts will be made to offer the patient care coordination information and services.   Encounter Outcome:  No Answer  Care Coordination Interventions Activated:  No   Care Coordination Interventions:  No, not indicated    Reece Levy MSW, LCSW Licensed Clinical Social Worker      [phone]

## 2021-11-22 ENCOUNTER — Telehealth: Payer: Self-pay | Admitting: *Deleted

## 2021-11-22 NOTE — Patient Outreach (Signed)
  Care Coordination   Follow Up Visit Note   11/22/2021 Name: [name] MRN: [id number] DOB: [birth year]  [name] is a 81 y.o. year old female who sees [name], MD for primary care. I  collaborated with HH rep to request paperwork needed for filing insurance claim  What matters to the patients health and wellness today?  Care support.     Goals Addressed             This Visit's Progress    To seek additional support/resources to aide in care       Care Coordination Interventions:  Completed paperwork received to request records from [facility] to file for insurance plan that reimburses some of the [facility] expenses Awaiting response from the [facility] agency for records to file with the Insurance policy Solution-Focused Strategies employed:  Mindfulness or Relaxation training provided Active listening / Reflection utilized  Emotional Support Provided Problem Solving /Task Center strategies reviewed Caregiver stress acknowledged  Consideration of in-home help encouraged : options discussed Discussed Health Care Power of Attorney and encouraged daughter to assist patient with getting this and other legal documents completed (Durable POA, Living Will,etc)   Encouraged daughter to find ways for self care, advocating/delegating actions to others as she can (Step-children to oversee pt's husband's needs more)        SDOH assessments and interventions completed:  Yes     Care Coordination Interventions Activated:  Yes  Care Coordination Interventions:  Yes, provided   Follow up plan: Follow up call scheduled for 11/28/21    Encounter Outcome:  Pt. Visit Completed   Reece Levy MSW, LCSW Licensed Clinical Social Worker      [phone]

## 2021-11-22 NOTE — Patient Outreach (Signed)
  Care Coordination   Collaboration with [facility] agency     11/22/2021 Name: [name] MRN: [id number] DOB: 1941/03/15  [name] is a 81 y.o. year old female who sees [name], MD for primary care. I  collaborated with [facility] agency rep and am assisting with sending request for necessary paperwork for family to file insurance claim for reimbursement.   What matters to the patients health and wellness today?  Getting help in home- seeking resources/support.     Goals Addressed             This Visit's Progress    To seek additional support/resources to aide in care       Care Coordination Interventions:  Completed paperwork received to request records from [facility] to file for insurance plan that reimburses some of the [facility] expenses Awaiting response from the [facility] agency for records to file with the Insurance policy Solution-Focused Strategies employed:  Mindfulness or Relaxation training provided Active listening / Reflection utilized  Emotional Support Provided Problem Solving /Task Center strategies reviewed Caregiver stress acknowledged  Consideration of in-home help encouraged : options discussed Discussed Health Care Power of Attorney and encouraged daughter to assist patient with getting this and other legal documents completed (Durable POA, Living Will,etc)   Encouraged daughter to find ways for self care, advocating/delegating actions to others as she can (Step-children to oversee pt's husband's needs more)        SDOH assessments and interventions completed:  Yes     Care Coordination Interventions Activated:  Yes  Care Coordination Interventions:  Yes, provided   Follow up plan: Follow up call scheduled for 11/28/21    Encounter Outcome:  Pt. Visit Completed

## 2021-11-23 ENCOUNTER — Telehealth: Payer: Self-pay

## 2021-11-23 NOTE — Progress Notes (Signed)
Chronic Care Management Pharmacy Assistant   Name: [name]  MRN: [id number] DOB: [birth year]   Reason for Encounter: Medication Coordination for [facility]    Recent office visits:  11/07/21 [name] MD. Seen for a Consult. No med changes.   Recent consult visits:  None  Hospital visits:  None  Medications: Outpatient Encounter Medications as of 11/23/2021  Medication Sig Note   Acetaminophen (TYLENOL) 325 MG CAPS Take 650 mg by mouth every 6 (six) hours as needed (pain).    Alirocumab (PRALUENT) 75 MG/ML SOAJ Inject 1 pen into the skin every 14 (fourteen) days. (Patient taking differently: Inject 75 mg into the skin every 14 (fourteen) days.)    amLODipine (NORVASC) 10 MG tablet Take 1 tablet (10 mg total) by mouth daily.    cyanocobalamin (,VITAMIN B-12,) 1000 MCG/ML injection Inject 1 mL (1,000 mcg total) into the muscle every 30 (thirty) days. (Patient not taking: Reported on 11/07/2021)    D3-50 1.25 MG (50000 UT) capsule TAKE 1 CAPSULE BY MOUTH ONE TIME PER WEEK (Patient taking differently: Take 50,000 Units by mouth every Tuesday.)    famotidine (PEPCID) 20 MG tablet Take 1 tablet (20 mg total) by mouth 2 (two) times daily.    furosemide (LASIX) 20 MG tablet TAKE 1 TABLET (20 MG TOTAL) BY MOUTH DAILY. TAKING EVERY DAY FOR SWELLING (Patient taking differently: Take 20 mg by mouth daily as needed for edema.)    latanoprost (XALATAN) 0.005 % ophthalmic solution Place 1 drop into both eyes at bedtime.    loratadine (CLARITIN) 10 MG tablet Take 10 mg by mouth daily as needed for allergies.    OVER THE COUNTER MEDICATION Apply 1 application. topically daily as needed (knee pain). Hempvana pain relief cream    ticagrelor (BRILINTA) 90 MG TABS tablet Take 90 mg by mouth 2 (two) times daily. 07/16/2021: Alternate Route: BY MOUTH. Med Classification: Hematological Agents   TRINTELLIX 10 MG TABS tablet TAKE ONE TABLET BY MOUTH EVERYDAY AT BEDTIME    valsartan (DIOVAN) 80 MG  tablet Take 2 tablets (160 mg total) by mouth daily. If systolic BP greater than 140 mmHG give 160 mg by mouth daily, if systolic BP between 120-140 mmHG give 80 mg by mouth daily (Patient taking differently: Take 80 mg by mouth daily. If systolic BP greater than 140 mmHG give 80 mg by mouth daily, if systolic BP between 120-140 mmHG give 40 mg by mouth daily)    vitamin B-12 (CYANOCOBALAMIN) 1000 MCG tablet Take 1,000 mcg by mouth every evening.    No facility-administered encounter medications on file as of 11/23/2021.    Reviewed chart for medication changes ahead of medication coordination call.  No Consults, or hospital visits since last care coordination call/Pharmacist visit. \  No medication changes indicated OR if recent visit, treatment plan here.  BP Readings from Last 3 Encounters:  11/07/21 (!) 140/90  07/23/21 (!) 176/82  07/16/21 (!) 176/74    Lab Results  Component Value Date   HGBA1C 5.0 07/05/2021     Patient obtains medications through Adherence Packaging  30 Days   Last adherence delivery included:  Valsartan 80mg  1-2 tabs daily as directed according to systolic BP (Bottles) Trintellix 10mg  1 BT Furosemide 20mg - 1 tab daily (Bottles) Amlodipine 10mg  1 B Vitamin D3 50,000units 1 BT on Tuesdays Brilinta 90mg  1 at B and 1 at BT Famotidine 20mg  1 at B and 1 BT  Patient declined (meds) last month Praluent 75mg - D/C due  to intolerance   Patient is due for next adherence delivery on: 12/06/21. Called patient and reviewed medications and coordinated delivery.  This delivery to include: Valsartan 80mg  1-2 tabs daily as directed according to systolic BP (Bottles) Trintellix 10mg  1 BT Furosemide 20mg - 1 tab daily (Bottles) Amlodipine 10mg  1 B Vitamin D3 50,000units 1 BT on Tuesdays Brilinta 90mg  1 at B and 1 at BT Famotidine 20mg  1 at B and 1 BT  Patient declined the following medications Praluent 75mg - Gets at [facility] for free. Filled on 10/16/21 84ds  Patient  needs refills-Request Sent  Furosemide 20mg  Amlodipine 10mg    Confirmed delivery date of 12/06/21, advised patient that pharmacy will contact them the morning of delivery.   , CMA Clinical Pharmacist Assistant  [phone]

## 2021-11-27 ENCOUNTER — Other Ambulatory Visit: Payer: Self-pay

## 2021-11-27 DIAGNOSIS — I10 Essential (primary) hypertension: Secondary | ICD-10-CM

## 2021-11-27 MED ORDER — AMLODIPINE BESYLATE 10 MG PO TABS: 10.0000 mg | ORAL_TABLET | Freq: Every day | ORAL | 1 refills | Status: DC

## 2021-11-27 MED ORDER — FUROSEMIDE 20 MG PO TABS: 20.0000 mg | ORAL_TABLET | Freq: Every day | ORAL | 1 refills | Status: DC | PRN

## 2021-12-26 ENCOUNTER — Telehealth: Payer: Self-pay

## 2021-12-26 NOTE — Telephone Encounter (Signed)
Compliant on meds 

## 2021-12-26 NOTE — Chronic Care Management (AMB) (Signed)
Chronic Care Management Pharmacy Assistant   Name: [name]  MRN: [id number] DOB: 1940/05/01  Reason for Encounter: Medication Review/ Medication coordination  Recent office visits:  None  Recent consult visits:  None  Hospital visits:  None in previous 6 months  Medications: Outpatient Encounter Medications as of 12/26/2021  Medication Sig Note   Acetaminophen (TYLENOL) 325 MG CAPS Take 650 mg by mouth every 6 (six) hours as needed (pain).    Alirocumab (PRALUENT) 75 MG/ML SOAJ Inject 1 pen into the skin every 14 (fourteen) days. (Patient taking differently: Inject 75 mg into the skin every 14 (fourteen) days.)    amLODipine (NORVASC) 10 MG tablet Take 1 tablet (10 mg total) by mouth daily.    cyanocobalamin (,VITAMIN B-12,) 1000 MCG/ML injection Inject 1 mL (1,000 mcg total) into the muscle every 30 (thirty) days. (Patient not taking: Reported on 11/07/2021)    D3-50 1.25 MG (50000 UT) capsule TAKE 1 CAPSULE BY MOUTH ONE TIME PER WEEK (Patient taking differently: Take 50,000 Units by mouth every Tuesday.)    famotidine (PEPCID) 20 MG tablet Take 1 tablet (20 mg total) by mouth 2 (two) times daily.    furosemide (LASIX) 20 MG tablet Take 1 tablet (20 mg total) by mouth daily as needed for edema.    latanoprost (XALATAN) 0.005 % ophthalmic solution Place 1 drop into both eyes at bedtime.    loratadine (CLARITIN) 10 MG tablet Take 10 mg by mouth daily as needed for allergies.    OVER THE COUNTER MEDICATION Apply 1 application. topically daily as needed (knee pain). Hempvana pain relief cream    ticagrelor (BRILINTA) 90 MG TABS tablet Take 90 mg by mouth 2 (two) times daily. 07/16/2021: Alternate Route: BY MOUTH. Med Classification: Hematological Agents   TRINTELLIX 10 MG TABS tablet TAKE ONE TABLET BY MOUTH EVERYDAY AT BEDTIME    valsartan (DIOVAN) 80 MG tablet Take 2 tablets (160 mg total) by mouth daily. If systolic BP greater than [id number] mmHG give 160 mg by mouth daily, if  systolic BP between 0000000 mmHG give 80 mg by mouth daily (Patient taking differently: Take 80 mg by mouth daily. If systolic BP greater than [id number] mmHG give 80 mg by mouth daily, if systolic BP between 0000000 mmHG give 40 mg by mouth daily)    vitamin B-12 (CYANOCOBALAMIN) 1000 MCG tablet Take 1,000 mcg by mouth every evening.    No facility-administered encounter medications on file as of 12/26/2021.  Reviewed chart for medication changes ahead of medication coordination call.  No hospital visits since last care coordination call/Pharmacist visit.  No medication changes indicated   BP Readings from Last 3 Encounters:  11/07/21 (!) 140/90  07/23/21 (!) 176/82  07/16/21 (!) 176/74    Lab Results  Component Value Date   HGBA1C 5.0 07/05/2021     Patient obtains medications through Adherence Packaging  30 Days   Last adherence delivery included:  Valsartan 80mg  1-2 tabs daily as directed according to systolic BP (Bottles) Trintellix 10mg  1 BT Furosemide 20mg - 1 tab daily (Bottles) Amlodipine 10mg  1 B Vitamin D3 50,000units 1 BT on Tuesdays Brilinta 90mg  1 at B and 1 at BT Famotidine 20mg  1 at B and 1 BT  Patient declined (meds) last month: Praluent 75mg - [facility]. Filled on 10/16/21 84 DS  Patient is due for next adherence delivery on: 01-07-2022  Called patient and reviewed medications and coordinated delivery.  This delivery to include: Valsartan 80mg  1-2  tabs daily as directed according to systolic BP (Bottles) Trintellix 10mg  1 BT Furosemide 20mg - 1 tab daily (Bottles) Amlodipine 10mg  1 B Vitamin D3 50,000units 1 BT on Tuesdays Brilinta 90mg  1 at B and 1 at BT Famotidine 20mg  1 at B and 1 BT  No short/acute fill needed  Patient declined the following medications: Praluent 75mg - [facility]. Filled on 10/16/21 84 DS  Patient needs refills for: Sent to PCP Brillinta  Confirmed delivery date of 01-07-2022 advised patient that pharmacy will  contact them the morning of delivery.   Care Gaps: Shingrix overdue PNA Vac overdue Dexa scan overdue  Star Rating Drugs: Valsartan 80 mg- Last filled 12-03-2021 30 DS [facility]  [place] Clinical Pharmacist Assistant [phone]

## 2021-12-28 NOTE — Telephone Encounter (Signed)
Patient uses different dose pretty much every day depending on BP readings. Will maintain alternate dosing per patient's wishes

## 2022-01-02 ENCOUNTER — Other Ambulatory Visit: Payer: Self-pay | Admitting: Legal Medicine

## 2022-01-02 NOTE — Progress Notes (Signed)
CARDIOLOGY OFFICE NOTE  Date:  01/07/2022    [name] Date of Birth: 1940-04-28 Medical Record #[id number]  PCP:  [name], MD  Cardiologist:  Eden Emms    No chief complaint on file.   History of Present Illness:  81 y.o. History of HTN, LBBB, HLD. 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 Echo with EF 60-65% MRI with multiple infarcts in pons, cerebellar peduncle and significant intracranial vascular disease. Neuro suggested Permissive HTN and ASA/Plavix. 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 Intolerant to statins And was on niacin. Carotids have been variable but most recent duplex 06/20/20   only 40-59% LICA stenosis   Still with difficulty walking due to stroke and bad arthritic knees  She has had a SEM and echo 05/25/19 showed AV sclerosis with mean gradient only 10 mmhg and AVA> 2 cm2  Admitted to [facility] August 2021 with dehydration , syncope and elevated troponin. Did fine with hydration ECG with chronic LBBB and echo with normal EF no RWAM's or valve dx and no pericardial effusion   She takes Effexor for depression   Discussed need to Rx LdL which is over 200 Options include PSK9 and nexlitol willing to try if Not cost prohibitive   Has some LLE swelling and pain  RLE [place] negative for DVT 06/04/21 TTE 07/05/21 EF 70-75% NOrmal RV no significant valve DX  Feels generally poorly no cardiac issues On praluent and Brillinta for continued stroke   Past Medical History:  Diagnosis Date   Benign paroxysmal vertigo, unspecified ear 06/07/2019   CKD (chronic kidney disease), stage III (HCC)    GERD (gastroesophageal reflux disease)    Glaucoma     Past Surgical History:  Procedure Laterality Date   ABDOMINAL HYSTERECTOMY     BACK SURGERY     cataract surgery     COLONOSCOPY N/A 08/03/2015   Procedure: COLONOSCOPY;  Surgeon: Carman Ching, MD;  Location: [facility] ENDOSCOPY;  Service: Endoscopy;  Laterality:  N/A;   ESOPHAGOGASTRODUODENOSCOPY N/A 07/31/2015   Procedure: ESOPHAGOGASTRODUODENOSCOPY (EGD);  Surgeon: Carman Ching, MD;  Location: [facility] ENDOSCOPY;  Service: Endoscopy;  Laterality: N/A;   IR ANGIO INTRA EXTRACRAN SEL COM CAROTID INNOMINATE BILAT MOD SED  06/01/2021   IR ANGIO VERTEBRAL SEL SUBCLAVIAN INNOMINATE UNI L MOD SED  06/01/2021   IR RADIOLOGIST EVAL & MGMT  04/26/2021   OOPHORECTOMY     TOTAL HIP ARTHROPLASTY     URETER SURGERY       Medications: Current Outpatient Medications  Medication Sig Dispense Refill   Acetaminophen (TYLENOL) 325 MG CAPS Take 650 mg by mouth every 6 (six) hours as needed (pain).     Alirocumab (PRALUENT) 75 MG/ML SOAJ Inject 1 pen into the skin every 14 (fourteen) days. (Patient taking differently: Inject 75 mg into the skin every 14 (fourteen) days.) 6 mL 3   amLODipine (NORVASC) 10 MG tablet Take 1 tablet (10 mg total) by mouth daily. 90 tablet 1   BRILINTA 90 MG TABS tablet TAKE ONE TABLET BY MOUTH EVERY MORNING and TAKE ONE TABLET BY MOUTH EVERYDAY AT BEDTIME 60 tablet 2   D3-50 1.25 MG (50000 UT) capsule TAKE 1 CAPSULE BY MOUTH ONE TIME PER WEEK (Patient taking differently: Take 50,000 Units by mouth every Tuesday.) 12 capsule 3   famotidine (PEPCID) 20 MG tablet Take 1 tablet (20 mg total) by mouth 2 (two) times daily. 180 tablet 2   furosemide (LASIX) 20  MG tablet Take 1 tablet (20 mg total) by mouth daily as needed for edema. 90 tablet 1   latanoprost (XALATAN) 0.005 % ophthalmic solution Place 1 drop into both eyes at bedtime.     loratadine (CLARITIN) 10 MG tablet Take 10 mg by mouth daily as needed for allergies.     OVER THE COUNTER MEDICATION Apply 1 application. topically daily as needed (knee pain). Hempvana pain relief cream     TRINTELLIX 10 MG TABS tablet TAKE ONE TABLET BY MOUTH EVERYDAY AT BEDTIME 90 tablet 1   valsartan (DIOVAN) 80 MG tablet Take 2 tablets (160 mg total) by mouth daily. If systolic BP greater than 140 mmHG give 160 mg by  mouth daily, if systolic BP between 120-140 mmHG give 80 mg by mouth daily (Patient taking differently: Take 80 mg by mouth daily. If systolic BP greater than 140 mmHG give 80 mg by mouth daily, if systolic BP between 120-140 mmHG give 40 mg by mouth daily) 180 tablet 3   vitamin B-12 (CYANOCOBALAMIN) 1000 MCG tablet Take 1,000 mcg by mouth every evening.     No current facility-administered medications for this visit.    Allergies: Allergies  Allergen Reactions   Codeine Nausea And Vomiting   Gabapentin     Dizziness, couldn't function    Meclizine Other (See Comments)    Lethargy, extreme sleepiness   Requip [Ropinirole Hcl] Nausea And Vomiting   Statins     Muscle weakness    Social History: The patient  reports that she has never smoked. She has never used smokeless tobacco. She reports that she does not drink alcohol and does not use drugs.   Family History: The patient's family history includes Diabetes in her brother; Heart attack in her father; Hypertension in her brother, father, and mother.   Review of Systems: Please see the history of present illness.   Otherwise, the review of systems is positive for none.   All other systems are reviewed and negative.   Physical Exam: VS:  BP (!) 142/80   Pulse 66   Ht 5' (1.524 m)   Wt 157 lb (71.2 kg)   SpO2 98%   BMI 30.66 kg/m  .  BMI Body mass index is 30.66 kg/m.  Wt Readings from Last 3 Encounters:  01/07/22 157 lb (71.2 kg)  11/07/21 155 lb (70.3 kg)  07/23/21 154 lb (69.9 kg)    Affect appropriate Healthy:  appears stated age HEENT: normal Neck supple with no adenopathy JVP normal bilateral carotid  Bruits high pitched on left  no thyromegaly Lungs clear with no wheezing and good diaphragmatic motion Heart:  S1/S2  SEM murmur, no rub, gallop or click PMI normal Abdomen: benighn, BS positve, no tenderness, no AAA no bruit.  No HSM or HJR Distal pulses intact with no bruits No edema Neuro non-focal Skin  warm and dry No muscular weakness    LABORATORY DATA:  EKG: 08/02/15   NSR with LBBB. 05/25/18 SR rate 75 LBBB LAD no changes   Lab Results  Component Value Date   WBC 8.0 11/07/2021   HGB 13.6 11/07/2021   HCT 42.1 11/07/2021   PLT 360 11/07/2021   GLUCOSE 113 (H) 11/07/2021   CHOL 287 (H) 11/07/2021   TRIG 439 (H) 11/07/2021   HDL 38 (L) 11/07/2021   LDLCALC 164 (H) 11/07/2021   ALT 15 11/07/2021   AST 17 11/07/2021   NA 141 11/07/2021   K 4.1 11/07/2021   CL  104 11/07/2021   CREATININE 1.16 (H) 11/07/2021   BUN 28 (H) 11/07/2021   CO2 20 11/07/2021   TSH 1.210 11/07/2021   INR 1.0 06/01/2021   HGBA1C 5.0 07/05/2021    BNP (last 3 results) No results for input(s): "BNP" in the last 8760 hours.  ProBNP (last 3 results) No results for input(s): "PROBNP" in the last 8760 hours.   Other Studies Reviewed Today:  Echo Study Conclusions from 07/05/21  EF 70-75% no AS or significant valve dx  Assessment/Plan: 1. 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 HTN Well controlled.  Continue current medications and low sodium Dash type diet.    3. HLD on Praluent labs with primary   4. Chronic LBBB stable no high grade AV block   5. Murmur:  TTE reviewed 05/25/19 AV sclerosis mean gradient 10 mmHg  no stenosis EF normal 60-65% Recent TTE does not mention any AS or gradients 07/05/21   Carotid Duplex   F/U in a year     [employer]

## 2022-01-07 ENCOUNTER — Encounter: Payer: Self-pay | Admitting: Cardiovascular Disease

## 2022-01-07 ENCOUNTER — Ambulatory Visit: Payer: Medicare HMO | Attending: Cardiovascular Disease | Admitting: Cardiovascular Disease

## 2022-01-07 VITALS — BP 142/80 | HR 66 | Ht 60.0 in | Wt 157.0 lb

## 2022-01-07 DIAGNOSIS — I6522 Occlusion and stenosis of left carotid artery: Secondary | ICD-10-CM | POA: Diagnosis not present

## 2022-01-07 DIAGNOSIS — E785 Hyperlipidemia, unspecified: Secondary | ICD-10-CM | POA: Diagnosis not present

## 2022-01-07 DIAGNOSIS — R011 Cardiac murmur, unspecified: Secondary | ICD-10-CM | POA: Diagnosis not present

## 2022-01-07 DIAGNOSIS — E782 Mixed hyperlipidemia: Secondary | ICD-10-CM | POA: Diagnosis not present

## 2022-01-07 NOTE — Patient Instructions (Addendum)
Medication Instructions:  Your physician recommends that you continue on your current medications as directed. Please refer to the Current Medication list given to you today.  *If you need a refill on your cardiac medications before your next appointment, please call your pharmacy*  Lab Work: If you have labs (blood work) drawn today and your tests are completely normal, you will receive your results only by: [place] (if you have MyChart) OR A paper copy in the mail If you have any lab test that is abnormal or we need to change your treatment, we will call you to review the results.  Testing/Procedures: Your physician has requested that you have a carotid duplex. This test is an ultrasound of the carotid arteries in your neck. It looks at blood flow through these arteries that supply the brain with blood. Allow one hour for this exam. There are no restrictions or special instructions.  Follow-Up: At [facility], you and your health needs are our priority.  As part of our continuing mission to provide you with exceptional heart care, we have created designated Provider Care Teams.  These Care Teams include your primary Cardiologist (physician) and Advanced Practice Providers (APPs -  Physician Assistants and Nurse Practitioners) who all work together to provide you with the care you need, when you need it.  We recommend signing up for the patient portal called "MyChart".  Sign up information is provided on this After Visit Summary.  MyChart is used to connect with patients for Virtual Visits (Telemedicine).  Patients are able to view lab/test results, encounter notes, upcoming appointments, etc.  Non-urgent messages can be sent to your provider as well.   To learn more about what you can do with MyChart, go to [email or web address].    Your next appointment:   12 month(s)  The format for your next appointment:   In Person  Provider:   Jenkins Rouge, MD      Important  Information About Sugar

## 2022-01-09 DIAGNOSIS — E785 Hyperlipidemia, unspecified: Secondary | ICD-10-CM | POA: Diagnosis not present

## 2022-01-09 DIAGNOSIS — I700 Atherosclerosis of aorta: Secondary | ICD-10-CM | POA: Diagnosis not present

## 2022-01-09 DIAGNOSIS — R69 Illness, unspecified: Secondary | ICD-10-CM | POA: Diagnosis not present

## 2022-01-09 DIAGNOSIS — G450 Vertebro-basilar artery syndrome: Secondary | ICD-10-CM | POA: Diagnosis not present

## 2022-01-09 DIAGNOSIS — I2510 Atherosclerotic heart disease of native coronary artery without angina pectoris: Secondary | ICD-10-CM | POA: Diagnosis not present

## 2022-01-09 DIAGNOSIS — I129 Hypertensive chronic kidney disease with stage 1 through stage 4 chronic kidney disease, or unspecified chronic kidney disease: Secondary | ICD-10-CM | POA: Diagnosis not present

## 2022-01-09 DIAGNOSIS — I739 Peripheral vascular disease, unspecified: Secondary | ICD-10-CM | POA: Diagnosis not present

## 2022-01-09 DIAGNOSIS — E669 Obesity, unspecified: Secondary | ICD-10-CM | POA: Diagnosis not present

## 2022-01-09 DIAGNOSIS — H409 Unspecified glaucoma: Secondary | ICD-10-CM | POA: Diagnosis not present

## 2022-01-09 DIAGNOSIS — H547 Unspecified visual loss: Secondary | ICD-10-CM | POA: Diagnosis not present

## 2022-01-09 DIAGNOSIS — I771 Stricture of artery: Secondary | ICD-10-CM | POA: Diagnosis not present

## 2022-01-09 DIAGNOSIS — H3530 Unspecified macular degeneration: Secondary | ICD-10-CM | POA: Diagnosis not present

## 2022-01-14 ENCOUNTER — Emergency Department (HOSPITAL_COMMUNITY): Payer: Medicare HMO

## 2022-01-14 ENCOUNTER — Emergency Department (HOSPITAL_COMMUNITY)
Admission: EM | Admit: 2022-01-14 | Discharge: 2022-01-14 | Disposition: A | Discharge status: Home / Self Care | Payer: Medicare HMO | Attending: Emergency Medicine | Admitting: Emergency Medicine

## 2022-01-14 DIAGNOSIS — Z79899 Other long term (current) drug therapy: Secondary | ICD-10-CM | POA: Diagnosis not present

## 2022-01-14 DIAGNOSIS — N183 Chronic kidney disease, stage 3 unspecified: Secondary | ICD-10-CM | POA: Diagnosis not present

## 2022-01-14 DIAGNOSIS — S0083XA Contusion of other part of head, initial encounter: Secondary | ICD-10-CM

## 2022-01-14 DIAGNOSIS — Z7902 Long term (current) use of antithrombotics/antiplatelets: Secondary | ICD-10-CM | POA: Diagnosis not present

## 2022-01-14 DIAGNOSIS — I6782 Cerebral ischemia: Secondary | ICD-10-CM | POA: Diagnosis not present

## 2022-01-14 DIAGNOSIS — M2578 Osteophyte, vertebrae: Secondary | ICD-10-CM | POA: Diagnosis not present

## 2022-01-14 DIAGNOSIS — S01511A Laceration without foreign body of lip, initial encounter: Secondary | ICD-10-CM | POA: Insufficient documentation

## 2022-01-14 DIAGNOSIS — W19XXXA Unspecified fall, initial encounter: Secondary | ICD-10-CM

## 2022-01-14 DIAGNOSIS — H05232 Hemorrhage of left orbit: Secondary | ICD-10-CM

## 2022-01-14 DIAGNOSIS — W01198A Fall on same level from slipping, tripping and stumbling with subsequent striking against other object, initial encounter: Secondary | ICD-10-CM | POA: Insufficient documentation

## 2022-01-14 DIAGNOSIS — I10 Essential (primary) hypertension: Secondary | ICD-10-CM | POA: Diagnosis not present

## 2022-01-14 DIAGNOSIS — I129 Hypertensive chronic kidney disease with stage 1 through stage 4 chronic kidney disease, or unspecified chronic kidney disease: Secondary | ICD-10-CM | POA: Insufficient documentation

## 2022-01-14 DIAGNOSIS — S0993XA Unspecified injury of face, initial encounter: Secondary | ICD-10-CM | POA: Diagnosis not present

## 2022-01-14 DIAGNOSIS — R221 Localized swelling, mass and lump, neck: Secondary | ICD-10-CM | POA: Diagnosis not present

## 2022-01-14 DIAGNOSIS — S0512XA Contusion of eyeball and orbital tissues, left eye, initial encounter: Secondary | ICD-10-CM | POA: Diagnosis not present

## 2022-01-14 DIAGNOSIS — I6381 Other cerebral infarction due to occlusion or stenosis of small artery: Secondary | ICD-10-CM | POA: Diagnosis not present

## 2022-01-14 DIAGNOSIS — R0902 Hypoxemia: Secondary | ICD-10-CM | POA: Diagnosis not present

## 2022-01-14 LAB — CBC WITH DIFFERENTIAL/PLATELET
Abs Immature Granulocytes: 0.03 10*3/uL (ref 0.00–0.07)
Basophils Absolute: 0 10*3/uL (ref 0.0–0.1)
Basophils Relative: 0 %
Eosinophils Absolute: 0.1 10*3/uL (ref 0.0–0.5)
Eosinophils Relative: 1 %
HCT: 39.4 % (ref 36.0–46.0)
Hemoglobin: 12.7 g/dL (ref 12.0–15.0)
Immature Granulocytes: 0 %
Lymphocytes Relative: 15 %
Lymphs Abs: 1.2 10*3/uL (ref 0.7–4.0)
MCH: 30.8 pg (ref 26.0–34.0)
MCHC: 32.2 g/dL (ref 30.0–36.0)
MCV: 95.6 fL (ref 80.0–100.0)
Monocytes Absolute: 0.7 10*3/uL (ref 0.1–1.0)
Monocytes Relative: 9 %
Neutro Abs: 5.7 10*3/uL (ref 1.7–7.7)
Neutrophils Relative %: 75 %
Platelets: 285 10*3/uL (ref 150–400)
RBC: 4.12 MIL/uL (ref 3.87–5.11)
RDW: 12.6 % (ref 11.5–15.5)
WBC: 7.7 10*3/uL (ref 4.0–10.5)
nRBC: 0 % (ref 0.0–0.2)

## 2022-01-14 LAB — COMPREHENSIVE METABOLIC PANEL
ALT: 19 U/L (ref 0–44)
AST: 18 U/L (ref 15–41)
Albumin: 3.8 g/dL (ref 3.5–5.0)
Alkaline Phosphatase: 102 U/L (ref 38–126)
Anion gap: 11 (ref 5–15)
BUN: 27 mg/dL — ABNORMAL HIGH (ref 8–23)
CO2: 21 mmol/L — ABNORMAL LOW (ref 22–32)
Calcium: 10.2 mg/dL (ref 8.9–10.3)
Chloride: 106 mmol/L (ref 98–111)
Creatinine, Ser: 1.13 mg/dL — ABNORMAL HIGH (ref 0.44–1.00)
GFR, Estimated: 49 mL/min — ABNORMAL LOW (ref >60)
Glucose, Bld: 115 mg/dL — ABNORMAL HIGH (ref 70–99)
Potassium: 3.9 mmol/L (ref 3.5–5.1)
Sodium: 138 mmol/L (ref 135–145)
Total Bilirubin: 0.4 mg/dL (ref 0.3–1.2)
Total Protein: 7 g/dL (ref 6.5–8.1)

## 2022-01-14 NOTE — ED Provider Notes (Signed)
[facility] EMERGENCY DEPARTMENT Provider Note   CSN: [id number] Arrival date & time: 01/14/22  1743     History  Chief Complaint  Patient presents with   [name] is a 81 y.o. female.  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  Reports she was going out on the back door when she fell onto the concrete.  Reports she fell face first. Denies any dizziness or lightheadedness preceding the fall, denies LOC. Reports it was just an accident and she fell onto her face.  Denies significant headache, denies neck pain, back pain, numbness/weakness, chest pain, dyspnea, abdominal pain, nausea/vomiting, vision changes other than swelling around left eye making it difficult to open, no extremity pain. Reports she was able to walk to the EMS stretcher without problems.  No loose teeth .  Past Medical History:  Diagnosis Date   Benign paroxysmal vertigo, unspecified ear 06/07/2019   CKD (chronic kidney disease), stage III (HCC)    GERD (gastroesophageal reflux disease)    Glaucoma     Home Medications Prior to Admission medications   Medication Sig Start Date End Date Taking? Authorizing Provider  Acetaminophen (TYLENOL) 325 MG CAPS Take 650 mg by mouth every 6 (six) hours as needed (pain).    [provider]  Alirocumab (PRALUENT) 75 MG/ML SOAJ Inject 1 pen into the skin every 14 (fourteen) days. Patient taking differently: Inject 75 mg into  the skin every 14 (fourteen) days. 05/24/21   Wendall Stade, MD  amLODipine (NORVASC) 10 MG tablet Take 1 tablet (10 mg total) by mouth daily. 11/27/21   [name], MD  BRILINTA 90 MG TABS tablet TAKE ONE TABLET BY MOUTH EVERY MORNING and TAKE ONE TABLET BY MOUTH EVERYDAY AT BEDTIME 01/02/22   [name], MD  D3-50 1.25 MG (50000 UT) capsule TAKE 1 CAPSULE BY MOUTH ONE TIME PER WEEK Patient taking differently: Take 50,000 Units by mouth every Tuesday. 04/15/21   [name], MD  famotidine (PEPCID) 20 MG tablet Take 1 tablet (20 mg total) by mouth 2 (two) times daily. 07/31/21   [name], MD  furosemide (LASIX) 20 MG tablet Take 1 tablet (20 mg total) by mouth daily as needed for edema. 11/27/21   [name], MD  latanoprost (XALATAN) 0.005 % ophthalmic solution Place 1 drop into both eyes at bedtime. 12/09/18   [provider]  loratadine (CLARITIN) 10 MG tablet Take 10 mg by mouth daily as needed for allergies.    [provider]  OVER THE COUNTER MEDICATION Apply 1 application. topically daily as needed (knee pain). Hempvana pain relief cream    [provider]  TRINTELLIX 10 MG TABS tablet TAKE ONE TABLET BY MOUTH EVERYDAY AT BEDTIME 10/01/21   [name], MD  valsartan (DIOVAN) 80 MG tablet Take 2 tablets (160 mg total) by mouth daily. If systolic BP greater than 140 mmHG give 160 mg by mouth daily, if systolic  BP between 120-140 mmHG give 80 mg by mouth daily Patient taking differently: Take 80 mg by mouth daily. If systolic BP greater than 242 mmHG give 80 mg by mouth daily, if systolic BP between 353-614 mmHG give 40 mg by mouth daily 07/23/21   Rip Harbour, NP  vitamin B-12 (CYANOCOBALAMIN) 1000 MCG tablet Take 1,000 mcg by mouth every evening.    [provider]      Allergies    Codeine, Gabapentin, Meclizine, Requip [ropinirole hcl], and Statins    Review of Systems   Review of  Systems  Physical Exam Updated Vital Signs BP (!) 153/91   Pulse 88   Temp 98.1 F (36.7 C) (Oral)   Resp 15   Ht 5' (1.524 m)   Wt 68.9 kg   SpO2 99%   BMI 29.69 kg/m  Physical Exam Vitals and nursing note reviewed.  Constitutional:      General: She is not in acute distress.    Appearance: She is well-developed. She is not diaphoretic.  HENT:     Head:     Comments: Large periorbital/frontal hematoma Superficial mucosal upper lip laceration Eyes:     Extraocular Movements: Extraocular movements intact.     Conjunctiva/sclera: Conjunctivae normal.     Pupils: Pupils are equal, round, and reactive to light.     Comments: No hymphema, normal globe shape, normal movements  Cardiovascular:     Rate and Rhythm: Normal rate and regular rhythm.     Heart sounds: Normal heart sounds. No murmur heard.    No friction rub. No gallop.  Pulmonary:     Effort: Pulmonary effort is normal. No respiratory distress.     Breath sounds: Normal breath sounds. No wheezing or rales.     Comments: No chest wall or abdominal contusions or tenderness Abdominal:     General: There is no distension.     Palpations: Abdomen is soft.     Tenderness: There is no abdominal tenderness. There is no guarding.  Musculoskeletal:        General: No tenderness.     Cervical back: Normal range of motion.  Skin:    General: Skin is warm and dry.     Findings: No erythema or rash.  Neurological:     Mental Status: She is alert and oriented to person, place, and time.     ED Results / Procedures / Treatments   Labs (all labs ordered are listed, but only abnormal results are displayed) Labs Reviewed  COMPREHENSIVE METABOLIC PANEL - Abnormal; Notable for the following components:      Result Value   CO2 21 (*)    Glucose, Bld 115 (*)    BUN 27 (*)    Creatinine, Ser 1.13 (*)    GFR, Estimated 49 (*)    All other components within normal limits  CBC WITH DIFFERENTIAL/PLATELET    EKG EKG  Interpretation  Date/Time:  Monday January 14 2022 18:03:48 EDT Ventricular Rate:  97 PR Interval:  186 QRS Duration: 145 QT Interval:  392 QTC Calculation: 498 R Axis:   -14 Text Interpretation: Sinus rhythm IVCD, consider atypical LBBB No significant change since last tracing Confirmed by Gareth Morgan [phone] on 01/14/2022 7:12:35 PM  Radiology CT Maxillofacial Wo Contrast  Result Date: 01/14/2022 CLINICAL DATA:  Status post fall. EXAM: CT MAXILLOFACIAL WITHOUT CONTRAST TECHNIQUE: Multidetector CT imaging of the maxillofacial structures was performed. Multiplanar CT image reconstructions were also generated. RADIATION DOSE REDUCTION: This exam  was performed according to the departmental dose-optimization program which includes automated exposure control, adjustment of the mA and/or kV according to patient size and/or use of iterative reconstruction technique. COMPARISON:  None Available. FINDINGS: Osseous: No fracture or mandibular dislocation. No destructive process. Orbits: Marked severity left-sided preseptal an adjacent periorbital soft tissue swelling is seen. The globes are intact, without evidence of hemorrhage. Sinuses: Clear. Soft tissues: There is marked severity left-sided facial and left periorbital soft tissue swelling. An associated 2.5 cm x 1.2 cm x 2.7 cm left facial hematoma is noted. Limited intracranial: No significant or unexpected finding. IMPRESSION: 1. Marked severity left preseptal, left-sided facial and left periorbital soft tissue swelling with an associated 2.5 cm x 1.2 cm x 2.7 cm left facial hematoma. Further evaluation with ophthalmology consult is recommended. 2. No evidence of an acute fracture or mandibular dislocation. Electronically Signed   By: Aram Candela M.D.   On: 01/14/2022 19:16   CT Cervical Spine Wo Contrast  Result Date: 01/14/2022 CLINICAL DATA:  Status post fall. EXAM: CT CERVICAL SPINE WITHOUT CONTRAST TECHNIQUE: Multidetector CT imaging  of the cervical spine was performed without intravenous contrast. Multiplanar CT image reconstructions were also generated. RADIATION DOSE REDUCTION: This exam was performed according to the departmental dose-optimization program which includes automated exposure control, adjustment of the mA and/or kV according to patient size and/or use of iterative reconstruction technique. COMPARISON:  None Available. FINDINGS: Alignment: Normal. Skull base and vertebrae: No acute fracture. No primary bone lesion or focal pathologic process. Soft tissues and spinal canal: No prevertebral fluid or swelling. No visible canal hematoma. Disc levels: Moderate severity endplate sclerosis, mild anterior osteophyte formation and mild posterior bony spurring are seen at the levels of C4-C5, C5-C6. Posterior endplate sclerosis and posterior bony spurring are seen at the level of C3-C4. There is marked severity narrowing of the anterior atlantoaxial articulation. Marked severity intervertebral disc space narrowing is seen at C3-C4, C4-C5 and C5-C6. Bilateral moderate to marked severity multilevel facet joint hypertrophy is noted. Upper chest: Negative. Other: There is marked severity left-sided facial soft tissue swelling. IMPRESSION: 1. No acute fracture or subluxation in the cervical spine. 2. Marked severity multilevel degenerative changes. 3. Marked severity left-sided facial soft tissue swelling. Electronically Signed   By: Aram Candela M.D.   On: 01/14/2022 19:04   CT Head Wo Contrast  Result Date: 01/14/2022 CLINICAL DATA:  Status post fall. EXAM: CT HEAD WITHOUT CONTRAST TECHNIQUE: Contiguous axial images were obtained from the base of the skull through the vertex without intravenous contrast. RADIATION DOSE REDUCTION: This exam was performed according to the departmental dose-optimization program which includes automated exposure control, adjustment of the mA and/or kV according to patient size and/or use of iterative  reconstruction technique. COMPARISON:  None Available. FINDINGS: Brain: There is mild to moderate severity cerebral atrophy with widening of the extra-axial spaces and ventricular dilatation. There are areas of decreased attenuation within the white matter tracts of the supratentorial brain, consistent with microvascular disease changes. Small, chronic, bilateral basal ganglia lacunar infarcts are noted. Small, chronic left cerebellar and left occipital lobe infarcts are again seen. Vascular: There is marked severity bilateral cavernous carotid artery calcification. Skull: Normal. Negative for fracture or focal lesion. Sinuses/Orbits: No acute finding. Other: There is marked severity left-sided preseptal and left facial soft tissue swelling. An associated 2.6 cm x 1.3 cm x 2.8 cm left facial hematoma is noted. IMPRESSION: 1. Marked severity left-sided preseptal and left facial soft tissue swelling with an  associated 2.6 cm x 1.3 cm x 2.8 cm left facial hematoma. 2. No acute intracranial abnormality. 3. Generalized cerebral atrophy with chronic white matter small vessel ischemic changes. Electronically Signed   By: Aram Candela M.D.   On: 01/14/2022 18:57    Procedures Procedures     Visual Acuity  Right Eye Distance: 20/80 Left Eye Distance: 20/80 Bilateral Distance: 20/80 (pt states at baseline "pt has macular degeneration and glaucoma. visual difficulty at baseline." patient not wearing corrective lenses during exam.)  Right Eye Near:   Left Eye Near:    Bilateral Near:     Medications Ordered in ED Medications - No data to display  ED Course/ Medical Decision Making/ A&P                            81 year old female with a history of occipital CVA, vertebral artery stenosis with occlusion, carotid artery aneurysms, CKD stage III, left bundle branch block, hypertension, admission with acute multifocal cerebral infarction of the left hemisphere and right occipital lobe infarcts in April  2023 with significant atherosclerotic sclerotic disease of the head and neck, 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  Reports mechanical fall and denies any other medical concerns.    No sign of injury to chest, abdomen, pelvis, back or extremities and she was able to ambulate following the fall.   CT head, maxillofacial, CSpine completed given age, head trauma on Brilinta.  Has superficial laceration to mucosal lip that will heal by secondary intention.  CT images personally interpreted by me and radiology without signs of fracture, or intracranial bleed. Does have facial hematoma.  Her vision is equal bilaterally and unchanged per pt with history of glaucoma and macular degeneration. She has no globe abnormalities, normal pupils, no hyphema and normal EOM .  Discussed with Ophthalmology Dr. Demetrius Charity on call for [facility] as that is where her Ophthalmologist is located.  Recommends outpatient follow up in their office in 1-2 days.  Discussed reasonable to hold her night dose of brilinta but feel she should continue the medication tomorrow given risk/benefit ratio of continuing.  Recommend elevation, ice, and close follow up.           Final Clinical Impression(s) / ED Diagnoses Final diagnoses:  Fall, initial encounter  Contusion of face, initial encounter  Periorbital hematoma of left eye    Rx / DC Orders ED Discharge Orders     None         Alvira Monday, MD 01/15/22 1017

## 2022-01-14 NOTE — Progress Notes (Deleted)
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  Has statin intolerance.  LDL goal less than 70 Keep blood pressure 120-140/80-90 - follow up with Dr. (has appointment tomorrow) Hgb A1c goal less than 7 Continued monitoring of cerebral aneurysms Repeat CTA of head in 6 months. Follow up in 6 months (after repeat CTA of head)     Subjective:  [name] is an 81 year old right-handed female with CHF, HTN, HLD, CKD and history of CVA who follows up for recent stroke.  History supplemented by her daughter.   UPDATE: Current medications:  Brilinta 90mg  BID, Praluent, ***  CTA was ordered but has not been performed.  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  She also was found to have bilateral ICA stenosis.  Carotid ultrasound from 06/20/2020 showed 40-59% left ICA stenosis and 1-39% right ICA stenosis.  In September 2022, she started experiencing heaviness in her legs causing her to fall.  No associated dizziness or loss of consciousness.  Legs just give out.  She has pain in her legs but no numbness.  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  She previously used a cane but had to resort to a walker.  She also noticed some weakness in the right arm as well.  CT head performed on 03/14/2021 showed chronic small vessel ischemic changes in the cerebral white matter with  remote left cerebellar infarct but no acute intracranial abnormalities.  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  She was referred to interventional neuroradiology where she underwent cerebral angiogram on 06/01/2021 who favored continued monitoring due to high risk.  Following the angiogram, she developed thigh hematoma and right leg weakness.  She went to the ED on 06/04/2021 where DVT was ruled out and was advised by IR to continue Plavix.  On 06/07/2021, she developed weakness of right hand.  PCP ordered MRV of head performed on 06/14/2021 which showed abnormal areas of enhancement suspicious for subacute infarcts in the left occipital and parietal lobes.  On 07/04/2021, she developed headache, aphasia, slurred speech and right facial droop so she returned to the ED on 07/04/2021 where MRI of brian again showed patchy left hemispheric infarcts and right PCA territory infarcts.  MRA head and neck again showed diffuse atheromatous disease with stenoses and cerebral aneurysms.  TEE showed LVEF 70-75% with grade I diastolic dysfunction  but no cardiac source of emboli.  LDL was 78.  Hgb A1c was 5.  She was on Plavix prior to admission.  She was discharged on ASA 81mg  daily and Brilinta 90mg  twice daily for 3 months followed by Brilinta alone.       PAST MEDICAL HISTORY: Past Medical History:  Diagnosis Date   Benign paroxysmal  vertigo, unspecified ear 06/07/2019   CKD (chronic kidney disease), stage III (HCC)    GERD (gastroesophageal reflux disease)    Glaucoma     MEDICATIONS: Current Outpatient Medications on File Prior to Visit  Medication Sig Dispense Refill   Acetaminophen (TYLENOL) 325 MG CAPS Take 650 mg by mouth every 6 (six) hours as needed (pain).     Alirocumab (PRALUENT) 75 MG/ML SOAJ Inject 1 pen into the skin every 14 (fourteen) days. (Patient taking differently: Inject 75 mg into the skin every 14 (fourteen) days.) 6 mL 3   amLODipine (NORVASC) 10 MG tablet Take 1 tablet (10 mg total) by mouth daily. 90 tablet 1   BRILINTA 90 MG TABS tablet TAKE ONE TABLET BY MOUTH EVERY MORNING and TAKE ONE TABLET BY MOUTH EVERYDAY AT BEDTIME 60 tablet 2   D3-50 1.25 MG (50000 UT) capsule TAKE 1 CAPSULE BY MOUTH ONE TIME PER WEEK (Patient taking differently: Take 50,000 Units by mouth every Tuesday.) 12 capsule 3   famotidine (PEPCID) 20 MG tablet Take 1 tablet (20 mg total) by mouth 2 (two) times daily. 180 tablet 2   furosemide (LASIX) 20 MG tablet Take 1 tablet (20 mg total) by mouth daily as needed for edema. 90 tablet 1   latanoprost (XALATAN) 0.005 % ophthalmic solution Place 1 drop into both eyes at bedtime.     loratadine (CLARITIN) 10 MG tablet Take 10 mg by mouth daily as needed for allergies.     OVER THE COUNTER MEDICATION Apply 1 application. topically daily as needed (knee pain). Hempvana pain relief cream     TRINTELLIX 10 MG TABS tablet TAKE ONE TABLET BY MOUTH EVERYDAY AT BEDTIME 90 tablet 1   valsartan (DIOVAN) 80 MG tablet Take 2 tablets (160 mg total) by mouth daily. If systolic BP greater than 140 mmHG give 160 mg by mouth daily, if systolic BP between 120-140 mmHG give 80 mg by mouth daily (Patient taking differently: Take 80 mg by mouth daily. If systolic BP greater than 140 mmHG give 80 mg by mouth daily, if systolic BP between 120-140 mmHG give 40 mg by mouth daily) 180 tablet 3   vitamin B-12  (CYANOCOBALAMIN) 1000 MCG tablet Take 1,000 mcg by mouth every evening.     No current facility-administered medications on file prior to visit.    ALLERGIES: Allergies  Allergen Reactions   Codeine Nausea And Vomiting   Gabapentin     Dizziness, couldn't function    Meclizine Other (See Comments)    Lethargy, extreme sleepiness   Requip [Ropinirole Hcl] Nausea And Vomiting   Statins     Muscle weakness    FAMILY HISTORY: Family History  Problem Relation Age of Onset   Hypertension Mother    Hypertension Father    Heart attack Father    Hypertension Brother    Diabetes Brother       Objective:  *** General: No acute distress.  Patient appears ***-groomed.   Head:  Normocephalic/atraumatic Eyes:  Fundi examined but not visualized Neck: supple, no paraspinal tenderness, full range of motion Heart:  Regular rate and rhythm Lungs:  Clear to auscultation bilaterally Back: No paraspinal tenderness Neurological Exam: alert and oriented to person, place, and time.  Speech fluent and not dysarthric, language intact.  CN II-XII intact. Bulk and tone normal, muscle strength 5/5 throughout.  Sensation to light touch intact.  Deep tendon reflexes 2+ throughout, toes downgoing.  Finger to nose testing intact.  Gait normal, Romberg negative.   Metta Clines, DO  CC: ***

## 2022-01-14 NOTE — ED Notes (Signed)
Superficial lip lac noted on assessment per MD

## 2022-01-14 NOTE — ED Triage Notes (Addendum)
Pt bib EMS fell out back door and fell face first on cement. On [place]. Large visible hematoma on left side, eye swollen shut. Aox4. Hx of strokes and aneurysms.

## 2022-01-14 NOTE — Discharge Instructions (Signed)
You can hold your brilinta dose tonight and resume dosing tomorrow morning.

## 2022-01-14 NOTE — ED Notes (Signed)
Trauma Response Nurse Documentation   [name] is a 81 y.o. female arriving to [facility] ED via EMS  On Brilinta (ticagrelor) 90 mg bid. Trauma was activated as a Level 2 by ED Charge RN based on the following trauma criteria Elderly patients > 65 with head trauma on anti-coagulation (excluding ASA). Trauma team at the bedside on patient arrival.   Patient cleared for CT by Dr. Billy Fischer. Pt transported to CT with trauma response nurse present to monitor. RN remained with the patient throughout their absence from the department for clinical observation.   GCS 15.  History   Past Medical History:  Diagnosis Date   Benign paroxysmal vertigo, unspecified ear 06/07/2019   CKD (chronic kidney disease), stage III (HCC)    GERD (gastroesophageal reflux disease)    Glaucoma      Past Surgical History:  Procedure Laterality Date   ABDOMINAL HYSTERECTOMY     BACK SURGERY     cataract surgery     COLONOSCOPY N/A 08/03/2015   Procedure: COLONOSCOPY;  Surgeon: Laurence Spates, MD;  Location: [place];  Service: Endoscopy;  Laterality: N/A;   ESOPHAGOGASTRODUODENOSCOPY N/A 07/31/2015   Procedure: ESOPHAGOGASTRODUODENOSCOPY (EGD);  Surgeon: Laurence Spates, MD;  Location: [facility] ENDOSCOPY;  Service: Endoscopy;  Laterality: N/A;   IR ANGIO INTRA EXTRACRAN SEL COM CAROTID INNOMINATE BILAT MOD SED  06/01/2021   IR ANGIO VERTEBRAL SEL SUBCLAVIAN INNOMINATE UNI L MOD SED  06/01/2021   IR RADIOLOGIST EVAL & MGMT  04/26/2021   OOPHORECTOMY     TOTAL HIP ARTHROPLASTY     URETER SURGERY       Initial Focused Assessment (If applicable, or please see trauma documentation): - GCS 15 - L eye swollen shut - can minimally open it - Vision seemingly not impared - Large hematoma to L cheek/eye area with bruising - superficial lac to left lip  CT's Completed:   CT Head, CT Maxillofacial, and CT C-Spine   Interventions:  - CT head, face and c-spine - basic labs - 20G PIV to [facility] - Spoke with patient's  daughter - Encouraged pt to ask for pain meds when needed  Plan for disposition:  Other Unsure at this time.  Awaiting scan results.  Consults completed:  none at 1900.  Event Summary: Pt was exiting her back door when she thinks she got hung on the door and proceeded to fall onto her concrete carport, landing on the left side of her face.  Pt had no LOC.  Takes Brilinta for previous strokes.  Bedside handoff with ED RN Katrina and [place].    [name]  Trauma Response RN  Please call TRN at [phone] for further assistance.

## 2022-01-15 ENCOUNTER — Ambulatory Visit: Payer: Medicare HMO | Admitting: Neurology

## 2022-01-15 ENCOUNTER — Telehealth: Payer: Self-pay | Admitting: Anesthesiology

## 2022-01-15 NOTE — Telephone Encounter (Signed)
Patient's daughter [name] called to inform Dr Tomi Likens that her mother [name] fell and was admitted to the hospital on 10/23. She had a CT scan done and she would like Dr Tomi Likens to review it and give her a call back to discuss results with her.

## 2022-01-16 DIAGNOSIS — H401131 Primary open-angle glaucoma, bilateral, mild stage: Secondary | ICD-10-CM | POA: Diagnosis not present

## 2022-01-16 DIAGNOSIS — H05232 Hemorrhage of left orbit: Secondary | ICD-10-CM | POA: Diagnosis not present

## 2022-01-16 DIAGNOSIS — H16142 Punctate keratitis, left eye: Secondary | ICD-10-CM | POA: Diagnosis not present

## 2022-01-16 NOTE — Telephone Encounter (Signed)
Called and informed Daughter of pt per Dr. Tomi Likens. She understood and was told new appointment schedule.

## 2022-01-21 ENCOUNTER — Ambulatory Visit (HOSPITAL_COMMUNITY)
Admission: RE | Admit: 2022-01-21 | Payer: Medicare HMO | Source: Ambulatory Visit | Attending: Cardiovascular Disease | Admitting: Cardiovascular Disease

## 2022-01-23 ENCOUNTER — Encounter (HOSPITAL_COMMUNITY): Payer: Self-pay

## 2022-01-24 ENCOUNTER — Telehealth: Payer: Self-pay

## 2022-01-24 NOTE — Progress Notes (Signed)
Chronic Care Management Pharmacy Assistant   Name: CIAIRA NATIVIDAD  MRN: [id number] DOB: 12-29-1940  Reason for Encounter: Medication Review/ Medication coordination  Recent office visits:  None  Recent consult visits:  01-07-2022 Josue Hector, MD (Cardiology).  VAS US CAROTID ordered. Referral placed to [facility] Pharm[facility].  Hospital visits:  Medication Reconciliation was completed by comparing discharge summary, patient's EMR and Pharmacy list, and upon discussion with patient.  Admitted to the hospital on 01-14-2022 due to Fall. Discharge date was 01-14-2022. Discharged from [place]?Medications Started at [facility] Discharge:?? None  Medication Changes at Hospital Discharge: None  Medications Discontinued at Hospital Discharge: None  Medications that remain the same after Hospital Discharge:??  -All other medications will remain the same.    Medications: Outpatient Encounter Medications as of 01/24/2022  Medication Sig   Acetaminophen (TYLENOL) 325 MG CAPS Take 650 mg by mouth every 6 (six) hours as needed (pain).   Alirocumab (PRALUENT) 75 MG/ML SOAJ Inject 1 pen into the skin every 14 (fourteen) days. (Patient taking differently: Inject 75 mg into the skin every 14 (fourteen) days.)   amLODipine (NORVASC) 10 MG tablet Take 1 tablet (10 mg total) by mouth daily.   BRILINTA 90 MG TABS tablet TAKE ONE TABLET BY MOUTH EVERY MORNING and TAKE ONE TABLET BY MOUTH EVERYDAY AT BEDTIME   D3-50 1.25 MG (50000 UT) capsule TAKE 1 CAPSULE BY MOUTH ONE TIME PER WEEK (Patient taking differently: Take 50,000 Units by mouth every Tuesday.)   famotidine (PEPCID) 20 MG tablet Take 1 tablet (20 mg total) by mouth 2 (two) times daily.   furosemide (LASIX) 20 MG tablet Take 1 tablet (20 mg total) by mouth daily as needed for edema.   latanoprost (XALATAN) 0.005 % ophthalmic solution Place 1 drop into both eyes at bedtime.   loratadine (CLARITIN) 10 MG tablet  Take 10 mg by mouth daily as needed for allergies.   OVER THE COUNTER MEDICATION Apply 1 application. topically daily as needed (knee pain). Hempvana pain relief cream   TRINTELLIX 10 MG TABS tablet TAKE ONE TABLET BY MOUTH EVERYDAY AT BEDTIME   valsartan (DIOVAN) 80 MG tablet Take 2 tablets (160 mg total) by mouth daily. If systolic BP greater than 093 mmHG give 160 mg by mouth daily, if systolic BP between 818-299 mmHG give 80 mg by mouth daily (Patient taking differently: Take 80 mg by mouth daily. If systolic BP greater than 371 mmHG give 80 mg by mouth daily, if systolic BP between 696-789 mmHG give 40 mg by mouth daily)   vitamin B-12 (CYANOCOBALAMIN) 1000 MCG tablet Take 1,000 mcg by mouth every evening.   No facility-administered encounter medications on file as of 01/24/2022.  Reviewed chart for medication changes ahead of medication coordination call.  No OVs, Consults, or hospital visits since last care coordination call/Pharmacist visit.  No medication changes indicated OR if recent visit, treatment plan here.  BP Readings from Last 3 Encounters:  01/14/22 (!) 153/91  01/07/22 (!) 142/80  11/07/21 (!) 140/90    Lab Results  Component Value Date   HGBA1C 5.0 07/05/2021     Patient obtains medications through Adherence Packaging  30 Days   Last adherence delivery included:  Valsartan 80mg  1-2 tabs daily as directed according to systolic BP (Bottles) Trintellix 10mg  1 BT Furosemide 20mg - 1 tab daily (Bottles) Amlodipine 10mg  1 B Vitamin D3 50,000units 1 BT on Tuesdays Brilinta 90mg  1 at B and 1  at BT Famotidine 20mg  1 at B and 1 BT  Patient declined (meds) last month:  Praluent 75mg - [facility] free. Filled on 10/16/21 84 DS   Patient is due for next adherence delivery on: 02-05-2022  Called patient and reviewed medications and coordinated delivery.  This delivery to include: Valsartan 80mg  1-2 tabs daily as directed according to systolic BP (Bottles) Trintellix  10mg  1 BT Furosemide 20mg - 1 tab daily (Bottles) Amlodipine 10mg  1 B Vitamin D3 50,000units 1 BT on Tuesdays Brilinta 90mg  1 at B and 1 at BT Famotidine 20mg  1 at B and 1 BT  No acute/short fill needed  Patient declined the following medications: Praluent 75mg - [facility] Tylenol- OTC Claritn- OTC Latanoprost- Plenty supply B12- OTC  Patient needs refills for: None  Patient's daughter Confirmed delivery date of 02-05-2022, advised patient that pharmacy will contact them the morning of delivery.  Care Gaps: Shingrix overdue PNA Vac Dexa scan AWV overdue  Star Rating Drugs: Valsartan 80 mg- Last filled 01-03-2022 30 DS  [facility] CMA Clinical Pharmacist Assistant [phone]

## 2022-02-11 ENCOUNTER — Encounter: Payer: Self-pay | Admitting: Legal Medicine

## 2022-02-11 NOTE — Progress Notes (Signed)
Subjective:  Patient ID: [name], female    DOB: 07/29/1940  Age: 81 y.o. MRN: [id number]  Chief Complaint  Patient presents with   Hypertension   Hyperlipidemia    HPI    Patient presents for follow up of hypertension.  Patient tolerating Amlodipine 10 mg daily, Valsartan  well without side effects. Patient is working on maintaining diet and exercise regimen and follows up as directed.   Patient presents with hyperlipidemia.  Compliance with treatment has been good; patient takes medicines as directed, maintains low cholesterol diet, follows up as directed, and maintains exercise regimen.  Patient is using Praluent 1 pen every 14 days without problems.   Current Outpatient Medications on File Prior to Visit  Medication Sig Dispense Refill   Acetaminophen (TYLENOL) 325 MG CAPS Take 650 mg by mouth every 6 (six) hours as needed (pain).     Alirocumab (PRALUENT) 75 MG/ML SOAJ Inject 1 pen into the skin every 14 (fourteen) days. (Patient taking differently: Inject 75 mg into the skin every 14 (fourteen) days.) 6 mL 3   amLODipine (NORVASC) 10 MG tablet Take 1 tablet (10 mg total) by mouth daily. 90 tablet 1   BRILINTA 90 MG TABS tablet TAKE ONE TABLET BY MOUTH EVERY MORNING and TAKE ONE TABLET BY MOUTH EVERYDAY AT BEDTIME 60 tablet 2   D3-50 1.25 MG (50000 UT) capsule TAKE 1 CAPSULE BY MOUTH ONE TIME PER WEEK (Patient taking differently: Take 50,000 Units by mouth every Tuesday.) 12 capsule 3   famotidine (PEPCID) 20 MG tablet Take 1 tablet (20 mg total) by mouth 2 (two) times daily. 180 tablet 2   furosemide (LASIX) 20 MG tablet Take 1 tablet (20 mg total) by mouth daily as needed for edema. 90 tablet 1   latanoprost (XALATAN) 0.005 % ophthalmic solution Place 1 drop into both eyes at bedtime.     loratadine (CLARITIN) 10 MG tablet Take 10 mg by mouth daily as needed for allergies.     OVER THE COUNTER MEDICATION Apply 1 application. topically daily as needed (knee pain). Hempvana  pain relief cream     TRINTELLIX 10 MG TABS tablet TAKE ONE TABLET BY MOUTH EVERYDAY AT BEDTIME 90 tablet 1   valsartan (DIOVAN) 80 MG tablet Take 2 tablets (160 mg total) by mouth daily. If systolic BP greater than [id number] mmHG give 160 mg by mouth daily, if systolic BP between 0000000 mmHG give 80 mg by mouth daily (Patient taking differently: Take 80 mg by mouth daily. If systolic BP greater than [id number] mmHG give 80 mg by mouth daily, if systolic BP between 0000000 mmHG give 40 mg by mouth daily) 180 tablet 3   vitamin B-12 (CYANOCOBALAMIN) 1000 MCG tablet Take 1,000 mcg by mouth every evening.     No current facility-administered medications on file prior to visit.   Past Medical History:  Diagnosis Date   Acute embolic stroke ([place])    Benign paroxysmal vertigo, unspecified ear 06/07/2019   CKD (chronic kidney disease), stage III (HCC)    GERD (gastroesophageal reflux disease)    Glaucoma    Incisional infection 12/25/2020   Past Surgical History:  Procedure Laterality Date   ABDOMINAL HYSTERECTOMY     BACK SURGERY     cataract surgery     COLONOSCOPY N/A 08/03/2015   Procedure: COLONOSCOPY;  Surgeon: Laurence Spates, MD;  Location: [facility];  Service: Endoscopy;  Laterality: N/A;   ESOPHAGOGASTRODUODENOSCOPY N/A 07/31/2015   Procedure: ESOPHAGOGASTRODUODENOSCOPY (EGD);  Surgeon:  Carman Ching, MD;  Location: [facility] ENDOSCOPY;  Service: Endoscopy;  Laterality: N/A;   IR ANGIO INTRA EXTRACRAN SEL COM CAROTID INNOMINATE BILAT MOD SED  06/01/2021   IR ANGIO VERTEBRAL SEL SUBCLAVIAN INNOMINATE UNI L MOD SED  06/01/2021   IR RADIOLOGIST EVAL & MGMT  04/26/2021   OOPHORECTOMY     TOTAL HIP ARTHROPLASTY     URETER SURGERY      Family History  Problem Relation Age of Onset   Hypertension Mother    Hypertension Father    Heart attack Father    Hypertension Brother    Diabetes Brother    Social History   Socioeconomic History   Marital status: Married    Spouse name: Not on file   Number of  children: Not on file   Years of education: Not on file   Highest education level: Not on file  Occupational History   Not on file  Tobacco Use   Smoking status: Never   Smokeless tobacco: Never  Vaping Use   Vaping Use: Never used  Substance and Sexual Activity   Alcohol use: No   Drug use: No   Sexual activity: Not Currently  Other Topics Concern   Not on file  Social History Narrative   Not on file   Social Determinants of Health   Financial Resource Strain: Low Risk  (11/06/2021)   Overall Financial Resource Strain (CARDIA)    Difficulty of Paying Living Expenses: Not hard at all  Food Insecurity: No Food Insecurity (11/06/2021)   Hunger Vital Sign    Worried About Running Out of Food in the Last Year: Never true    Ran Out of Food in the Last Year: Never true  Transportation Needs: No Transportation Needs (11/06/2021)   PRAPARE - Administrator, Civil Service (Medical): No    Lack of Transportation (Non-Medical): No  Physical Activity: Insufficiently Active (07/12/2021)   Exercise Vital Sign    Days of Exercise per Week: 2 days    Minutes of Exercise per Session: 30 min  Stress: Not on file  Social Connections: Moderately Integrated (11/06/2021)   Social Connection and Isolation Panel [NHANES]    Frequency of Communication with Friends and Family: Never    Frequency of Social Gatherings with Friends and Family: More than three times a week    Attends Religious Services: Never    Database administrator or Organizations: Yes    Attends Banker Meetings: Never    Marital Status: Married    Review of Systems   Objective:  There were no vitals taken for this visit.     01/14/2022    8:30 PM 01/14/2022    8:00 PM 01/14/2022    7:30 PM  BP/Weight  Systolic BP 153 149 157  Diastolic BP 91 89 72    Physical Exam  Diabetic Foot Exam - Simple   No data filed      Lab Results  Component Value Date   WBC 7.7 01/14/2022   HGB 12.7  01/14/2022   HCT 39.4 01/14/2022   PLT 285 01/14/2022   GLUCOSE 115 (H) 01/14/2022   CHOL 287 (H) 11/07/2021   TRIG 439 (H) 11/07/2021   HDL 38 (L) 11/07/2021   LDLCALC 164 (H) 11/07/2021   ALT 19 01/14/2022   AST 18 01/14/2022   NA 138 01/14/2022   K 3.9 01/14/2022   CL 106 01/14/2022   CREATININE 1.13 (H) 01/14/2022   BUN 27 (  H) 01/14/2022   CO2 21 (L) 01/14/2022   TSH 1.210 11/07/2021   INR 1.0 06/01/2021   HGBA1C 5.0 07/05/2021      Assessment & Plan:   No show .  No orders of the defined types were placed in this encounter.   No orders of the defined types were placed in this encounter.    Follow-up: No follow-ups on file.  An After Visit Summary was printed and given to the patient.  Reinaldo Meeker, MD [facility] [phone]

## 2022-02-12 ENCOUNTER — Ambulatory Visit (INDEPENDENT_AMBULATORY_CARE_PROVIDER_SITE_OTHER): Payer: Medicare HMO | Admitting: Legal Medicine

## 2022-02-12 DIAGNOSIS — I10 Essential (primary) hypertension: Secondary | ICD-10-CM

## 2022-02-12 DIAGNOSIS — E782 Mixed hyperlipidemia: Secondary | ICD-10-CM

## 2022-02-12 DIAGNOSIS — K922 Gastrointestinal hemorrhage, unspecified: Secondary | ICD-10-CM

## 2022-02-12 DIAGNOSIS — F322 Major depressive disorder, single episode, severe without psychotic features: Secondary | ICD-10-CM

## 2022-02-13 ENCOUNTER — Telehealth: Payer: Self-pay | Admitting: Cardiovascular Disease

## 2022-02-13 NOTE — Telephone Encounter (Signed)
Called Carlos back at [employer]. Advised that Dr. Eden Emms is the patient's cardiologist and I would recommend that he reach out to the patient's PCP in regards to back brace equipment.

## 2022-02-13 NOTE — Telephone Encounter (Signed)
[employer] states they faxed over information in regards to back brace equipment, etc., and has not heard anything in response. Requesting call back.

## 2022-02-17 ENCOUNTER — Encounter: Payer: Self-pay | Admitting: Legal Medicine

## 2022-02-25 ENCOUNTER — Telehealth: Payer: Self-pay

## 2022-02-25 NOTE — Progress Notes (Signed)
Chronic Care Management Pharmacy Assistant   Name: [name]  MRN: [id number] DOB: 02-Oct-1940   Reason for Encounter: Medication Review/ Medication coordination  Recent office visits:  02-12-2022 [name], MD. Follow up visit  Recent consult visits:  None  Hospital visits:  Medication Reconciliation was completed by comparing discharge summary, patient's EMR and Pharmacy list, and upon discussion with patient.   Admitted to the hospital on 01-14-2022 due to Fall. Discharge date was 01-14-2022. Discharged from [facility].     New?Medications Started at [facility] Discharge:?? None   Medication Changes at Hospital Discharge: None   Medications Discontinued at Hospital Discharge: None   Medications that remain the same after Hospital Discharge:??  -All other medications will remain the same.     Medications: Outpatient Encounter Medications as of 02/25/2022  Medication Sig   Acetaminophen (TYLENOL) 325 MG CAPS Take 650 mg by mouth every 6 (six) hours as needed (pain).   Alirocumab (PRALUENT) 75 MG/ML SOAJ Inject 1 pen into the skin every 14 (fourteen) days. (Patient taking differently: Inject 75 mg into the skin every 14 (fourteen) days.)   amLODipine (NORVASC) 10 MG tablet Take 1 tablet (10 mg total) by mouth daily.   BRILINTA 90 MG TABS tablet TAKE ONE TABLET BY MOUTH EVERY MORNING and TAKE ONE TABLET BY MOUTH EVERYDAY AT BEDTIME   D3-50 1.25 MG (50000 UT) capsule TAKE 1 CAPSULE BY MOUTH ONE TIME PER WEEK (Patient taking differently: Take 50,000 Units by mouth every Tuesday.)   famotidine (PEPCID) 20 MG tablet Take 1 tablet (20 mg total) by mouth 2 (two) times daily.   furosemide (LASIX) 20 MG tablet Take 1 tablet (20 mg total) by mouth daily as needed for edema.   latanoprost (XALATAN) 0.005 % ophthalmic solution Place 1 drop into both eyes at bedtime.   loratadine (CLARITIN) 10 MG tablet Take 10 mg by mouth daily as needed for  allergies.   OVER THE COUNTER MEDICATION Apply 1 application. topically daily as needed (knee pain). Hempvana pain relief cream   TRINTELLIX 10 MG TABS tablet TAKE ONE TABLET BY MOUTH EVERYDAY AT BEDTIME   valsartan (DIOVAN) 80 MG tablet Take 2 tablets (160 mg total) by mouth daily. If systolic BP greater than 140 mmHG give 160 mg by mouth daily, if systolic BP between 120-140 mmHG give 80 mg by mouth daily (Patient taking differently: Take 80 mg by mouth daily. If systolic BP greater than 140 mmHG give 80 mg by mouth daily, if systolic BP between 120-140 mmHG give 40 mg by mouth daily)   vitamin B-12 (CYANOCOBALAMIN) 1000 MCG tablet Take 1,000 mcg by mouth every evening.   No facility-administered encounter medications on file as of 02/25/2022.  Reviewed chart for medication changes ahead of medication coordination call.  No medication changes indicated   BP Readings from Last 3 Encounters:  01/14/22 (!) 153/91  01/07/22 (!) 142/80  11/07/21 (!) 140/90    Lab Results  Component Value Date   HGBA1C 5.0 07/05/2021     Patient obtains medications through Adherence Packaging  30 Days   Last adherence delivery included:  Valsartan 80mg  1-2 tabs daily as directed according to systolic BP (Bottles) Trintellix 10mg  1 BT Furosemide 20mg - 1 tab daily (Bottles) Amlodipine 10mg  1 B Vitamin D3 50,000units 1 BT on Tuesdays Brilinta 90mg  1 at B and 1 at BT Famotidine 20mg  1 at B and 1 BT  Patient declined (meds) last month due to:  Praluent  75mg - [facility] Tylenol- OTC Claritn- OTC Latanoprost- Plenty supply B12- OTC  Patient is due for next adherence delivery on: 03-07-2022  Called patient and reviewed medications and coordinated delivery.  This delivery to include: Valsartan 80mg  1-2 tabs daily as directed according to systolic BP (Bottles) Trintellix 10mg  1 BT Furosemide 20mg - 1 tab daily (Bottles) Amlodipine 10mg  1 B Vitamin D3 50,000units 1 BT on Tuesdays Brilinta 90mg  1 at B  and 1 at BT Famotidine 20mg  1 at B and 1 BT  No acute/ short fills needed  Patient declined the following medications: Praluent 75mg - [facility] Tylenol- OTC Claritn- OTC Latanoprost- Plenty supply B12- OTC  Patient needs refills for: None  Confirmed delivery date of 03-07-2022 advised patient that pharmacy will contact them the morning of delivery.  Care Gaps: Shingrix overdue PNA Vac Dexa scan AWV overdue  Star Rating Drugs: Valsartan 80 mg- Last filled 01-30-2022 30 DS. Previous 01-03-2022 30 DS   [facility] Clinical Pharmacist Assistant [phone]

## 2022-03-08 NOTE — Progress Notes (Unsigned)
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  Has statin intolerance.  LDL goal less than 70 Keep blood pressure 120-140/80-90  Hgb A1c goal less than 7 Continued monitoring of cerebral aneurysms Repeat CTA of head now Follow up 6 months.   Subjective:  [name] is an 81 year old right-handed female with CHF, HTN, HLD, CKD and history of CVA who follows up for stroke and cerebral aneurysm.  History supplemented by her daughter.  UPDATE: Plan was to repeat CTA of  head in 6 months (which has not been performed).   Had a fall in October.  Seen in ED.  CT head/maxillary personally reviewed did not reveal fractures or acute intracranial abnormality but did demonstrate significant left sided soft tissue facial swelling and hematoma  She just doesn't feel well.  Cannot elaborate.  Still having right leg pain (ongoing sense the arteriogram).     HISTORY: In September 2022, she started experiencing heaviness in her legs causing her to fall.  No associated dizziness or loss of consciousness.  Legs just give out.  She has pain in her legs but no numbness.  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  She previously used a cane but had to resort to a walker.  She also noticed some weakness in the right arm as well.  Venlafaxine was changed to Trintellix which helped but symptoms continued.  Daughter is concerned that she may be on too much antihypertensive medications.  CT head performed on 03/14/2021 showed chronic small vessel ischemic changes in the cerebral white matter with remote left cerebellar  infarct but no acute intracranial abnormalities.  LDL was 71.  Serum glucose was 111.  TSH was 2.850.  B12 was low at 165 and she was started on monthly injections.  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  She was referred to interventional neuroradiology where she underwent cerebral angiogram on 06/01/2021 who favored continued monitoring due to high risk.  Following the angiogram, she developed thigh hematoma and right leg weakness.  She went to the ED on 06/04/2021 where DVT was ruled out and was advised by IR to continue Plavix.  On 06/07/2021, she developed weakness of right hand.  PCP ordered MRV of head performed on 06/14/2021 which showed abnormal areas of enhancement suspicious for subacute infarcts in the left occipital and parietal lobes.  On 07/04/2021, she developed headache, aphasia, slurred speech and right facial droop so she returned to the ED on 07/04/2021 where MRI of brian again showed patchy left hemispheric infarcts and right PCA territory infarcts.  MRA head and neck again showed diffuse atheromatous disease  with stenoses and cerebral aneurysms.  TEE showed LVEF 70-75% with grade I diastolic dysfunction but no cardiac source of emboli.  LDL was 78.  Hgb A1c was 5.  She was on Plavix prior to admission.  She was discharged on ASA 81mg  daily and Brilinta 90mg  twice daily for 3 months  followed by Brilinta alone.     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  She also was found to have bilateral ICA stenosis.  Carotid ultrasound from 06/20/2020 showed 40-59% left ICA stenosis and 1-39% right ICA stenosis.  PAST MEDICAL HISTORY: Past Medical History:  Diagnosis Date   Acute embolic stroke (HCC)    Benign paroxysmal vertigo, unspecified ear 06/07/2019   CKD (chronic kidney disease), stage III (HCC)    GERD (gastroesophageal reflux disease)    Glaucoma    Incisional infection 12/25/2020    MEDICATIONS: Current Outpatient Medications on File Prior to Visit  Medication Sig Dispense Refill   Acetaminophen (TYLENOL) 325 MG CAPS Take 650 mg by mouth every 6 (six) hours as needed (pain).     Alirocumab (PRALUENT) 75 MG/ML SOAJ Inject 1 pen into the skin every 14 (fourteen) days. (Patient taking differently: Inject 75 mg into the skin every 14 (fourteen) days.) 6 mL 3   amLODipine (NORVASC) 10 MG tablet Take 1 tablet (10 mg total) by mouth daily. 90 tablet 1   BRILINTA 90 MG TABS tablet TAKE ONE TABLET BY MOUTH EVERY MORNING and TAKE ONE TABLET BY MOUTH EVERYDAY AT BEDTIME 60 tablet 2   D3-50 1.25 MG (50000 UT) capsule TAKE 1 CAPSULE BY MOUTH ONE TIME PER WEEK (Patient taking differently: Take 50,000 Units by mouth every Tuesday.) 12 capsule 3   famotidine (PEPCID) 20 MG tablet Take 1 tablet (20 mg total) by mouth 2 (two) times daily. 180 tablet 2   furosemide (LASIX) 20 MG tablet Take 1 tablet (20 mg total) by mouth daily as needed for edema. 90 tablet 1   latanoprost (XALATAN) 0.005 % ophthalmic solution Place 1 drop into both eyes at bedtime.     loratadine (CLARITIN) 10 MG tablet Take 10 mg by mouth daily as needed for allergies.     OVER THE COUNTER MEDICATION Apply 1  application. topically daily as needed (knee pain). Hempvana pain relief cream     TRINTELLIX 10 MG TABS tablet TAKE ONE TABLET BY MOUTH EVERYDAY AT BEDTIME 90 tablet 1   valsartan (DIOVAN) 80 MG tablet Take 2 tablets (160 mg total) by mouth daily. If systolic BP greater than 140 mmHG give 160 mg by mouth daily, if systolic BP between 120-140 mmHG give 80 mg by mouth daily (Patient taking differently: Take 80 mg by mouth daily. If systolic BP greater than 140 mmHG give 80 mg by mouth daily, if systolic BP between 120-140 mmHG give 40 mg by mouth daily) 180 tablet 3   vitamin B-12 (CYANOCOBALAMIN) 1000 MCG tablet Take 1,000 mcg by mouth every evening.     No current facility-administered medications on file prior to visit.    ALLERGIES: Allergies  Allergen Reactions   Codeine Nausea And Vomiting   Gabapentin     Dizziness, couldn't function    Meclizine Other (See Comments)    Lethargy, extreme sleepiness   Requip [Ropinirole Hcl] Nausea And  Vomiting   Statins     Muscle weakness    FAMILY HISTORY: Family History  Problem Relation Age of Onset   Hypertension Mother    Hypertension Father    Heart attack Father    Hypertension Brother    Diabetes Brother       Objective:  Blood pressure (!) 161/81, pulse 73, SpO2 98 %. General: Depressed.  Patient appears well-groomed.   Head:  Normocephalic/atraumatic Eyes:  Fundi examined but not visualized Neck: supple, no paraspinal tenderness, full range of motion Heart:  Regular rate and rhythm Neurological Exam: alert and oriented to person, place, and time.  Speech fluent and not dysarthric, language intact.  CN II-XII intact. Bulk and tone normal, muscle strength 5/5 throughout.  Pain to palpation of right knee.  Sensation to pinprick and vibration intact.  Deep tendon reflexes 2+ throughout, toes downgoing.  Finger to nose testing intact.  Gait testing deferred.  In wheelchair.    Shon Millet, DO  CC: [name], MD

## 2022-03-11 ENCOUNTER — Encounter: Payer: Self-pay | Admitting: Neurology

## 2022-03-11 ENCOUNTER — Ambulatory Visit: Payer: Medicare HMO | Admitting: Neurology

## 2022-03-11 VITALS — BP 161/81 | HR 73

## 2022-03-11 DIAGNOSIS — I63532 Cerebral infarction due to unspecified occlusion or stenosis of left posterior cerebral artery: Secondary | ICD-10-CM

## 2022-03-11 DIAGNOSIS — I671 Cerebral aneurysm, nonruptured: Secondary | ICD-10-CM | POA: Diagnosis not present

## 2022-03-11 NOTE — Patient Instructions (Signed)
Schedule CTA of head at [facility] to follow up on aneurysm.

## 2022-03-25 DIAGNOSIS — N183 Chronic kidney disease, stage 3 unspecified: Secondary | ICD-10-CM | POA: Insufficient documentation

## 2022-03-26 ENCOUNTER — Telehealth: Payer: Self-pay

## 2022-03-26 NOTE — Progress Notes (Cosign Needed Addendum)
Reason for Encounter: Medication coordination and delivery  Contacted patient on 03/26/2022 to discuss medications   Recent office visits:  None  Recent consult visits:  03-11-2022 Pieter Partridge, DO (Neurology). Orders placed for CT ANGIO HEAD W OR WO CONTRAST.   Hospital visits:  Medication Reconciliation was completed by comparing discharge summary, patient's EMR and Pharmacy list, and upon discussion with patient.   Admitted to the hospital on 01-14-2022 due to Fall. Discharge date was 01-14-2022. Discharged from [facility]?Medications Started at [facility] Discharge:?? None   Medication Changes at Hospital Discharge: None   Medications Discontinued at Hospital Discharge: None   Medications that remain the same after Hospital Discharge:??  -All other medications will remain the same.   Medications: Outpatient Encounter Medications as of 03/26/2022  Medication Sig   Acetaminophen (TYLENOL) 325 MG CAPS Take 650 mg by mouth every 6 (six) hours as needed (pain).   Alirocumab (PRALUENT) 75 MG/ML SOAJ Inject 1 pen into the skin every 14 (fourteen) days. (Patient taking differently: Inject 75 mg into the skin every 14 (fourteen) days.)   amLODipine (NORVASC) 10 MG tablet Take 1 tablet (10 mg total) by mouth daily.   BRILINTA 90 MG TABS tablet TAKE ONE TABLET BY MOUTH EVERY MORNING and TAKE ONE TABLET BY MOUTH EVERYDAY AT BEDTIME   D3-50 1.25 MG (50000 UT) capsule TAKE 1 CAPSULE BY MOUTH ONE TIME PER WEEK (Patient taking differently: Take 50,000 Units by mouth every Tuesday.)   famotidine (PEPCID) 20 MG tablet Take 1 tablet (20 mg total) by mouth 2 (two) times daily.   furosemide (LASIX) 20 MG tablet Take 1 tablet (20 mg total) by mouth daily as needed for edema.   latanoprost (XALATAN) 0.005 % ophthalmic solution Place 1 drop into both eyes at bedtime.   loratadine (CLARITIN) 10 MG tablet Take 10 mg by mouth daily as needed for allergies.   OVER THE COUNTER  MEDICATION Apply 1 application. topically daily as needed (knee pain). Hempvana pain relief cream   TRINTELLIX 10 MG TABS tablet TAKE ONE TABLET BY MOUTH EVERYDAY AT BEDTIME   valsartan (DIOVAN) 80 MG tablet Take 2 tablets (160 mg total) by mouth daily. If systolic BP greater than 809 mmHG give 160 mg by mouth daily, if systolic BP between 983-382 mmHG give 80 mg by mouth daily (Patient taking differently: Take 80 mg by mouth daily. If systolic BP greater than 505 mmHG give 80 mg by mouth daily, if systolic BP between 397-673 mmHG give 40 mg by mouth daily)   vitamin B-12 (CYANOCOBALAMIN) 1000 MCG tablet Take 1,000 mcg by mouth every evening.   No facility-administered encounter medications on file as of 03/26/2022.   BP Readings from Last 3 Encounters:  03/11/22 (!) 161/81  01/14/22 (!) 153/91  01/07/22 (!) 142/80    Pulse Readings from Last 3 Encounters:  03/11/22 73  01/14/22 88  01/07/22 66    Lab Results  Component Value Date/Time   HGBA1C 5.0 07/05/2021 04:37 AM   HGBA1C 5.6 08/01/2015 02:43 AM   Lab Results  Component Value Date   CREATININE 1.13 (H) 01/14/2022   BUN 27 (H) 01/14/2022   GFRNONAA 49 (L) 01/14/2022   GFRAA 59 (L) 05/15/2020   NA 138 01/14/2022   K 3.9 01/14/2022   CALCIUM 10.2 01/14/2022   CO2 21 (L) 01/14/2022     Last adherence delivery date: 03-07-2022  Patient is due for next adherence delivery on: 04-05-2021  Spoke with patient on 03-26-2022 reviewed medications and coordinated delivery.  This delivery to include: Adherence Packaging  30 Days  Valsartan 80mg  1-2 tabs daily as directed according to systolic BP (Bottles) Trintellix 10mg  1 BT Furosemide 20mg - 1 tab daily (Bottles) Amlodipine 10mg  1 B Vitamin D3 50,000units 1 BT on Tuesdays Brilinta 90mg  1 at B and 1 at BT Famotidine 20mg  1 at B and 1 BT  Patient declined the following medications this month: Praluent 75mg - Gets [facility] Tylenol- OTC Claritn- OTC Latanoprost- [facility]  supply B12- OTC  Refills requested from providers include:  Trentellix Famotidine Brilinta  Confirmed delivery date of 04-05-2022, advised patient that pharmacy will contact them the morning of delivery.   Any concerns about your medications? No  How often do you forget or accidentally miss a dose? Never  Do you use a pillbox? Yes  Is patient in packaging Yes  If yes  What is the date on your next pill pack? Patient wasn't around medication to see date  Any concerns or issues with your packaging? None   Recent blood pressure readings are as follows: 03-11-2022 161/81  Recent blood glucose readings are as follows: 07-05-2021 5.0   Cycle dispensing form sent to Pattricia Boss for review.  04-02-2022: Sent message to PCP again for refills   [facility]

## 2022-03-27 ENCOUNTER — Other Ambulatory Visit: Payer: Self-pay

## 2022-03-27 DIAGNOSIS — F331 Major depressive disorder, recurrent, moderate: Secondary | ICD-10-CM

## 2022-03-27 DIAGNOSIS — K219 Gastro-esophageal reflux disease without esophagitis: Secondary | ICD-10-CM

## 2022-03-27 DIAGNOSIS — F32A Depression, unspecified: Secondary | ICD-10-CM

## 2022-03-27 NOTE — Telephone Encounter (Signed)
-----   Message from [facility] sent at 03/26/2022 12:50 PM EST ----- Regarding: Refills Patient needs refills sent to [facility] for Trentellix, Famotidine, Brilinta. Thank you

## 2022-03-29 ENCOUNTER — Encounter: Payer: Self-pay | Admitting: *Deleted

## 2022-04-02 ENCOUNTER — Other Ambulatory Visit: Payer: Self-pay

## 2022-04-02 DIAGNOSIS — R5383 Other fatigue: Secondary | ICD-10-CM

## 2022-04-02 DIAGNOSIS — F331 Major depressive disorder, recurrent, moderate: Secondary | ICD-10-CM

## 2022-04-02 DIAGNOSIS — K219 Gastro-esophageal reflux disease without esophagitis: Secondary | ICD-10-CM

## 2022-04-02 NOTE — Telephone Encounter (Signed)
-----   Message from [facility] sent at 04/02/2022 11:33 AM EST ----- Regarding: Refills Hi patient needs [facility]ills [facility] for Trentellix, Famotidine,Brilinta. Thank you

## 2022-04-03 ENCOUNTER — Other Ambulatory Visit: Payer: Self-pay

## 2022-04-03 ENCOUNTER — Telehealth: Payer: Self-pay

## 2022-04-03 DIAGNOSIS — F331 Major depressive disorder, recurrent, moderate: Secondary | ICD-10-CM

## 2022-04-03 DIAGNOSIS — K219 Gastro-esophageal reflux disease without esophagitis: Secondary | ICD-10-CM

## 2022-04-03 DIAGNOSIS — R5383 Other fatigue: Secondary | ICD-10-CM

## 2022-04-04 ENCOUNTER — Ambulatory Visit (INDEPENDENT_AMBULATORY_CARE_PROVIDER_SITE_OTHER): Payer: Medicare HMO | Admitting: Nurse Practitioner

## 2022-04-04 ENCOUNTER — Encounter: Payer: Self-pay | Admitting: Nurse Practitioner

## 2022-04-04 VITALS — BP 130/70 | HR 72 | Temp 97.1°F | Resp 16 | Ht 60.0 in | Wt 157.0 lb

## 2022-04-04 DIAGNOSIS — F331 Major depressive disorder, recurrent, moderate: Secondary | ICD-10-CM

## 2022-04-04 DIAGNOSIS — I69351 Hemiplegia and hemiparesis following cerebral infarction affecting right dominant side: Secondary | ICD-10-CM | POA: Diagnosis not present

## 2022-04-04 DIAGNOSIS — R69 Illness, unspecified: Secondary | ICD-10-CM | POA: Diagnosis not present

## 2022-04-04 DIAGNOSIS — I6930 Unspecified sequelae of cerebral infarction: Secondary | ICD-10-CM

## 2022-04-04 DIAGNOSIS — E782 Mixed hyperlipidemia: Secondary | ICD-10-CM | POA: Diagnosis not present

## 2022-04-04 DIAGNOSIS — G8929 Other chronic pain: Secondary | ICD-10-CM

## 2022-04-04 DIAGNOSIS — M25561 Pain in right knee: Secondary | ICD-10-CM | POA: Diagnosis not present

## 2022-04-04 DIAGNOSIS — I639 Cerebral infarction, unspecified: Secondary | ICD-10-CM

## 2022-04-04 DIAGNOSIS — K219 Gastro-esophageal reflux disease without esophagitis: Secondary | ICD-10-CM

## 2022-04-04 DIAGNOSIS — I10 Essential (primary) hypertension: Secondary | ICD-10-CM | POA: Diagnosis not present

## 2022-04-04 DIAGNOSIS — F322 Major depressive disorder, single episode, severe without psychotic features: Secondary | ICD-10-CM

## 2022-04-04 MED ORDER — VORTIOXETINE HBR 10 MG PO TABS: ORAL_TABLET | ORAL | 1 refills | Status: DC

## 2022-04-04 MED ORDER — FAMOTIDINE 20 MG PO TABS: 20.0000 mg | ORAL_TABLET | Freq: Two times a day (BID) | ORAL | 2 refills | Status: DC

## 2022-04-04 MED ORDER — TICAGRELOR 90 MG PO TABS: ORAL_TABLET | ORAL | 2 refills | Status: DC

## 2022-04-04 MED ORDER — TRIAMCINOLONE ACETONIDE 40 MG/ML IJ SUSP: 40.0000 mg | Freq: Once | INTRAMUSCULAR | Status: DC

## 2022-04-04 NOTE — Progress Notes (Signed)
Subjective:  Patient ID: [name], female    DOB: [birth year]  Age: 82 y.o. MRN: [id number]  Chief Complaints: Refill medicine and discuss about her current health status  History of Present illness:  82 year old female with a history of occipital CVA, vertebral artery stenosis with occlusion, carotid artery aneurysms, CKD stage III, left bundle branch block, hypertension,  history of macular degeneration and glaucoma presented to the clinic with several concerns. Patient live with her husband and daughter lives close by. Husband is not in the position to taking care of her. Husband has some Dementia and he is 11. Patient had OT/physical therapy at 2023 after stroke episode not under any home health care. Patient's granddaughter helps with bath and daughter helps with meals preparations. Daughter asked for the home health if her insurance covers. Patient can walk only with a walker otherwise she keeps falling. She is continent to bowel/bladder, sleeps in the living room in the couch with a potty chair nearby. Patient said she fell 2-3 times a day sometimes when she forgets to use walker. Patient said she eats well and walking only some.  Due to her right sided weakness post stroke effect she can't function her ADL's smoothly. She has a complain of right knee pain. Patient's daughter said that she used to take her to orthopedics in [place] long time ago and they suggested knee surgery but she did not like that idea. Patient has right knee pain for 3 years but got worse after arteriogram.They are using CBD oil ointment for right knee pain, that helps sometime. She also mentioned using regular strength.   For her HYPERTENSION she is taking Amlodipine 10 mg daily, Valsartan well without side effects. Her two week BP log seems higher in most of the time but Daughter refused to change in any medicines for now. Two weeks BP 16-170/70-80, patient is not watching her diet. Patient sleep all night and  day, patient said she does not like to do anything. Patient has a macular degeneration, glaucoma and because of residual stroke effects she can't function properly. She is following cardiologist  For hyperlipidemia she is using Praluent 1 pen every 14 days without problems.    Current Outpatient Medications on File Prior to Visit  Medication Sig Dispense Refill   Acetaminophen (TYLENOL) 325 MG CAPS Take 650 mg by mouth every 6 (six) hours as needed (pain).     Alirocumab (PRALUENT) 75 MG/ML SOAJ Inject 1 pen into the skin every 14 (fourteen) days. (Patient taking differently: Inject 75 mg into the skin every 14 (fourteen) days.) 6 mL 3   amLODipine (NORVASC) 10 MG tablet Take 1 tablet (10 mg total) by mouth daily. 90 tablet 1   D3-50 1.25 MG (50000 UT) capsule TAKE 1 CAPSULE BY MOUTH ONE TIME PER WEEK (Patient taking differently: Take 50,000 Units by mouth every Tuesday.) 12 capsule 3   furosemide (LASIX) 20 MG tablet Take 1 tablet (20 mg total) by mouth daily as needed for edema. 90 tablet 1   latanoprost (XALATAN) 0.005 % ophthalmic solution Place 1 drop into both eyes at bedtime.     loratadine (CLARITIN) 10 MG tablet Take 10 mg by mouth daily as needed for allergies.     OVER THE COUNTER MEDICATION Apply 1 application. topically daily as needed (knee pain). Hempvana pain relief cream     valsartan (DIOVAN) 80 MG tablet Take 2 tablets (160 mg total) by mouth daily. If systolic BP greater than 440 mmHG give  160 mg by mouth daily, if systolic BP between 120-140 mmHG give 80 mg by mouth daily (Patient taking differently: Take 80 mg by mouth daily. If systolic BP greater than 140 mmHG give 80 mg by mouth daily, if systolic BP between 120-140 mmHG give 40 mg by mouth daily) 180 tablet 3   vitamin B-12 (CYANOCOBALAMIN) 1000 MCG tablet Take 1,000 mcg by mouth every evening.     No current facility-administered medications on file prior to visit.   Past Medical History:  Diagnosis Date   Acute  embolic stroke (HCC)    Benign paroxysmal vertigo, unspecified ear 06/07/2019   CKD (chronic kidney disease), stage III (HCC)    GERD (gastroesophageal reflux disease)    Glaucoma    Incisional infection 12/25/2020   Past Surgical History:  Procedure Laterality Date   ABDOMINAL HYSTERECTOMY     BACK SURGERY     cataract surgery     COLONOSCOPY N/A 08/03/2015   Procedure: COLONOSCOPY;  Surgeon: Carman Ching, MD;  Location: [facility] ENDOSCOPY;  Service: Endoscopy;  Laterality: N/A;   ESOPHAGOGASTRODUODENOSCOPY N/A 07/31/2015   Procedure: ESOPHAGOGASTRODUODENOSCOPY (EGD);  Surgeon: Carman Ching, MD;  Location: [facility] ENDOSCOPY;  Service: Endoscopy;  Laterality: N/A;   IR ANGIO INTRA EXTRACRAN SEL COM CAROTID INNOMINATE BILAT MOD SED  06/01/2021   IR ANGIO VERTEBRAL SEL SUBCLAVIAN INNOMINATE UNI L MOD SED  06/01/2021   IR RADIOLOGIST EVAL & MGMT  04/26/2021   OOPHORECTOMY     TOTAL HIP ARTHROPLASTY     URETER SURGERY      Family History  Problem Relation Age of Onset   Hypertension Mother    Hypertension Father    Heart attack Father    Hypertension Brother    Diabetes Brother    Social History   Socioeconomic History   Marital status: Married    Spouse name: Not on file   Number of children: Not on file   Years of education: Not on file   Highest education level: Not on file  Occupational History   Not on file  Tobacco Use   Smoking status: Never   Smokeless tobacco: Never  Vaping Use   Vaping Use: Never used  Substance and Sexual Activity   Alcohol use: No   Drug use: No   Sexual activity: Not Currently  Other Topics Concern   Not on file  Social History Narrative   Not on file   Social Determinants of Health   Financial Resource Strain: Low Risk  (11/06/2021)   Overall Financial Resource Strain (CARDIA)    Difficulty of Paying Living Expenses: Not hard at all  Food Insecurity: No Food Insecurity (11/06/2021)   Hunger Vital Sign    Worried About Running Out of Food in the  Last Year: Never true    Ran Out of Food in the Last Year: Never true  Transportation Needs: No Transportation Needs (11/06/2021)   PRAPARE - Administrator, Civil Service (Medical): No    Lack of Transportation (Non-Medical): No  Physical Activity: Insufficiently Active (07/12/2021)   Exercise Vital Sign    Days of Exercise per Week: 2 days    Minutes of Exercise per Session: 30 min  Stress: Not on file  Social Connections: Moderately Integrated (11/06/2021)   Social Connection and Isolation Panel [NHANES]    Frequency of Communication with Friends and Family: Never    Frequency of Social Gatherings with Friends and Family: More than three times a week    Attends  Religious Services: Never    Active Member of Clubs or Organizations: Yes    Attends Archivist Meetings: Never    Marital Status: Married    Review of Systems  Constitutional:  Negative for chills, diaphoresis, fatigue and fever.  HENT:  Negative for congestion, ear pain, sinus pain and sore throat.   Respiratory:  Negative for cough and shortness of breath.   Cardiovascular:  Negative for chest pain, palpitations and leg swelling.  Gastrointestinal:  Negative for abdominal pain, constipation, diarrhea, nausea and vomiting.  Endocrine: Negative for polydipsia, polyphagia and polyuria.  Genitourinary:  Negative for dysuria and frequency.  Musculoskeletal:  Negative for arthralgias, back pain and myalgias.  Neurological:  Negative for dizziness and headaches.  Psychiatric/Behavioral:  Negative for dysphoric mood. The patient is not nervous/anxious.       04/04/2022   11:51 AM 11/07/2021    2:10 PM 02/06/2021    2:15 PM 12/18/2020   11:01 AM 09/12/2020   10:06 AM  Depression screen PHQ 2/9  Decreased Interest 2 0 1 3 1   Down, Depressed, Hopeless 0 0 3 0 1  PHQ - 2 Score 2 0 4 3 2   Altered sleeping 1 0 3 3 3   Tired, decreased energy 2 0 3 3 3   Change in appetite 1 0 0 2 0  Feeling bad or failure  about yourself  1 0 0 0 0  Trouble concentrating 2 0 0 0 0  Moving slowly or fidgety/restless 1 0 2 0 0  Suicidal thoughts 0 0 0 0 0  PHQ-9 Score 10 0 12 11 8   Difficult doing work/chores Somewhat difficult Not difficult at all Not difficult at all Somewhat difficult Not difficult at all     Objective:  BP 130/70   Pulse 72   Temp (!) 97.1 F (36.2 C)   Resp 16   Ht 5' (1.524 m)   Wt 157 lb (71.2 kg)   SpO2 95%   BMI 30.66 kg/m      04/04/2022   10:57 AM 03/11/2022   10:02 AM 01/14/2022    8:30 PM  BP/Weight  Systolic BP 401 027 253  Diastolic BP 70 81 91  Wt. (Lbs) 157    BMI 30.66 kg/m2      Physical Exam Vitals reviewed.  Constitutional:      Appearance: Normal appearance. She is normal weight.  Neck:     Vascular: No carotid bruit.  Cardiovascular:     Rate and Rhythm: Normal rate and regular rhythm.     Heart sounds: Normal heart sounds.  Pulmonary:     Effort: Pulmonary effort is normal.     Breath sounds: Normal breath sounds.  Abdominal:     General: Abdomen is flat. Bowel sounds are normal.     Palpations: Abdomen is soft.     Tenderness: There is no abdominal tenderness.  Musculoskeletal:        General: Tenderness present. No swelling, deformity or signs of injury.     Cervical back: Normal range of motion.     Right lower leg: No edema.     Left lower leg: No edema.     Comments: Right knee tenderness   Neurological:     Mental Status: She is alert and oriented to person, place, and time.     Sensory: Sensory deficit (poor vision) present.     Motor: Weakness present.     Gait: Gait abnormal.     Comments: Overall  right sided weakness post stroke  Psychiatric:        Mood and Affect: Mood normal.        Behavior: Behavior normal.     Lab Results  Component Value Date   WBC 7.7 01/14/2022   HGB 12.7 01/14/2022   HCT 39.4 01/14/2022   PLT 285 01/14/2022   GLUCOSE 115 (H) 01/14/2022   CHOL 287 (H) 11/07/2021   TRIG 439 (H) 11/07/2021    HDL 38 (L) 11/07/2021   LDLCALC 164 (H) 11/07/2021   ALT 19 01/14/2022   AST 18 01/14/2022   NA 138 01/14/2022   K 3.9 01/14/2022   CL 106 01/14/2022   CREATININE 1.13 (H) 01/14/2022   BUN 27 (H) 01/14/2022   CO2 21 (L) 01/14/2022   TSH 1.210 11/07/2021   INR 1.0 06/01/2021   HGBA1C 5.0 07/05/2021      Assessment & Plan:   Problem List Items Addressed This Visit       Cardiovascular and Mediastinum   Ischemic stroke (HCC)   Relevant Medications   ticagrelor (BRILINTA) 90 MG TABS tablet   Other Relevant Orders   Ambulatory referral to Home Health   Essential (primary) hypertension - Primary    Under controlled most of the days Patient takes Valsartan 80 mg Amolodipine 10 mg daily  Nutrition: Stressed importance of moderation in sodium intake, saturated fat and cholesterol, caloric balance, sufficient intake of complex carbohydrates, fiber, calcium and iron.   Exercise: Stressed the importance of regular exercise.         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   Exercise: Stressed the importance of regular exercise.         Relevant Orders   Ambulatory referral to Home Health   Depression, major, single episode, severe (HCC)    PHQ 10  Continue Trintellix.         Relevant Medications   vortioxetine HBr (TRINTELLIX) 10 MG TABS tablet   History of stroke with residual effects   Relevant Medications   ticagrelor (BRILINTA) 90 MG TABS tablet   Other Relevant Orders   Ambulatory referral to Home Health   Pain in right knee    Steroid injection given  Risks were discussed including bleeding, infection, increase in sugars if diabetic, atrophy at site of injection, and increased pain.  After verbal consent was obtained, using sterile technique the  right knee was prepped with alcohol.  The knee joint was entered anteriorly and  Kenalog 40 mg and 5 ml plain Lidocaine was then injected and the needle withdrawn.  The procedure was well tolerated.   The patient is asked to continue to rest the joint for a few more days before resuming regular activities.  It may be more painful for  the first 1-2 days.  Watch for fever, or increased swelling or persistent pain in the joint. Call or return to clinic prn if such symptoms occur or there is failure to improve as anticipated.   Referral to ortho      Relevant Medications   triamcinolone acetonide (KENALOG-40) injection 40 mg   Other Relevant Orders   Ambulatory referral to Orthopedic Surgery   Ambulatory referral to Home Health   Moderate recurrent major depression (HCC)   Relevant Medications   vortioxetine HBr (TRINTELLIX) 10 MG TABS tablet   Other Relevant Orders   Ambulatory referral to Home Health     Follow-up: in 3 months for chronic follow up and fasting labs   I, Anelia Carriveau have reviewed all documentation for this visit. The documentation on 04/04/22   for the exam, diagnosis, procedures, and orders are all accurate and complete.     An After Visit Summary was printed and given to the patient.  Lurline Del, DNP, FNP [facility] [phone]

## 2022-04-04 NOTE — Assessment & Plan Note (Addendum)
Under controlled most of the days Patient takes Valsartan 80 mg Amolodipine 10 mg daily  Nutrition: Stressed importance of moderation in sodium intake, saturated fat and cholesterol, caloric balance, sufficient intake of complex carbohydrates, fiber, calcium and iron.   Exercise: Stressed the importance of regular exercise.

## 2022-04-04 NOTE — Assessment & Plan Note (Addendum)
Patient is taking Praleunt 75 mg injection every 14 days Nutrition: Stressed importance of moderation in sodium intake, saturated fat and cholesterol, caloric balance, sufficient intake of complex carbohydrates, fiber, calcium and iron.   Exercise: Stressed the importance of regular exercise.

## 2022-04-04 NOTE — Assessment & Plan Note (Signed)
PHQ 10  Continue Trintellix.

## 2022-04-04 NOTE — Patient Instructions (Signed)
Follow up in 3 months Do regular BP monitor at home    DASH Eating Plan DASH stands for Dietary Approaches to Stop Hypertension. The DASH eating plan is a healthy eating plan that has been shown to: Reduce high blood pressure (hypertension). Reduce your risk for type 2 diabetes, heart disease, and stroke. Help with weight loss. What are tips for following this plan? Reading food labels Check food labels for the amount of salt (sodium) per serving. Choose foods with less than 5 percent of the Daily Value of sodium. Generally, foods with less than 300 milligrams (mg) of sodium per serving fit into this eating plan. To find whole grains, look for the word "whole" as the first word in the ingredient list. Shopping Buy products labeled as "low-sodium" or "no salt added." Buy fresh foods. Avoid canned foods and pre-made or frozen meals. Cooking Avoid adding salt when cooking. Use salt-free seasonings or herbs instead of table salt or sea salt. Check with your health care provider or pharmacist before using salt substitutes. Do not fry foods. Cook foods using healthy methods such as baking, boiling, grilling, roasting, and broiling instead. Cook with heart-healthy oils, such as olive, canola, avocado, soybean, or sunflower oil. Meal planning  Eat a balanced diet that includes: 4 or more servings of fruits and 4 or more servings of vegetables each day. Try to fill one-half of your plate with fruits and vegetables. 6-8 servings of whole grains each day. Less than 6 oz (170 g) of lean meat, poultry, or fish each day. A 3-oz (85-g) serving of meat is about the same size as a deck of cards. One egg equals 1 oz (28 g). 2-3 servings of low-fat dairy each day. One serving is 1 cup (237 mL). 1 serving of nuts, seeds, or beans 5 times each week. 2-3 servings of heart-healthy fats. Healthy fats called omega-3 fatty acids are found in foods such as walnuts, flaxseeds, fortified milks, and eggs. These fats  are also found in cold-water fish, such as sardines, salmon, and mackerel. Limit how much you eat of: Canned or prepackaged foods. Food that is high in trans fat, such as some fried foods. Food that is high in saturated fat, such as fatty meat. Desserts and other sweets, sugary drinks, and other foods with added sugar. Full-fat dairy products. Do not salt foods before eating. Do not eat more than 4 egg yolks a week. Try to eat at least 2 vegetarian meals a week. Eat more home-cooked food and less restaurant, buffet, and fast food. Lifestyle When eating at a restaurant, ask that your food be prepared with less salt or no salt, if possible. If you drink alcohol: Limit how much you use to: 0-1 drink a day for women who are not pregnant. 0-2 drinks a day for men. Be aware of how much alcohol is in your drink. In the [place] one drink equals one 12 oz bottle of beer (355 mL), one 5 oz glass of wine (148 mL), or one 1 oz glass of hard liquor (44 mL). General information Avoid eating more than 2,300 mg of salt a day. If you have hypertension, you may need to reduce your sodium intake to 1,500 mg a day. Work with your health care provider to maintain a healthy body weight or to lose weight. Ask what an ideal weight is for you. Get at least 30 minutes of exercise that causes your heart to beat faster (aerobic exercise) most days of the week. Activities may  include walking, swimming, or biking. Work with your health care provider or dietitian to adjust your eating plan to your individual calorie needs. What foods should I eat? Fruits All fresh, dried, or frozen fruit. Canned fruit in natural juice (without added sugar). Vegetables Fresh or frozen vegetables (raw, steamed, roasted, or grilled). Low-sodium or reduced-sodium tomato and vegetable juice. Low-sodium or reduced-sodium tomato sauce and tomato paste. Low-sodium or reduced-sodium canned vegetables. Grains Whole-grain or whole-wheat bread.  Whole-grain or whole-wheat pasta. Brown rice. [place]. Bulgur. Whole-grain and low-sodium cereals. Pita bread. Low-fat, low-sodium crackers. Whole-wheat flour tortillas. Meats and other proteins Skinless chicken or [place]. Ground chicken or [place]. Pork with fat trimmed off. Fish and seafood. Egg whites. Dried beans, peas, or lentils. Unsalted nuts, nut butters, and seeds. Unsalted canned beans. Lean cuts of beef with fat trimmed off. Low-sodium, lean precooked or cured meat, such as sausages or meat loaves. Dairy Low-fat (1%) or fat-free (skim) milk. Reduced-fat, low-fat, or fat-free cheeses. Nonfat, low-sodium ricotta or cottage cheese. Low-fat or nonfat yogurt. Low-fat, low-sodium cheese. Fats and oils Soft margarine without trans fats. Vegetable oil. Reduced-fat, low-fat, or light mayonnaise and salad dressings (reduced-sodium). Canola, safflower, olive, avocado, soybean, and sunflower oils. Avocado. Seasonings and condiments Herbs. Spices. Seasoning mixes without salt. Other foods Unsalted popcorn and pretzels. Fat-free sweets. The items listed above may not be a complete list of foods and beverages you can eat. Contact a dietitian for more information. What foods should I avoid? Fruits Canned fruit in a light or heavy syrup. Fried fruit. Fruit in cream or butter sauce. Vegetables Creamed or fried vegetables. Vegetables in a cheese sauce. Regular canned vegetables (not low-sodium or reduced-sodium). Regular canned tomato sauce and paste (not low-sodium or reduced-sodium). Regular tomato and vegetable juice (not low-sodium or reduced-sodium). Angie Fava. Olives. Grains Baked goods made with fat, such as croissants, muffins, or some breads. Dry pasta or rice meal packs. Meats and other proteins Fatty cuts of meat. Ribs. Fried meat. Berniece Salines. Bologna, salami, and other precooked or cured meats, such as sausages or meat loaves. Fat from the back of a pig (fatback). Bratwurst. Salted nuts and  seeds. Canned beans with added salt. Canned or smoked fish. Whole eggs or egg yolks. Chicken or [place] with skin. Dairy Whole or 2% milk, cream, and half-and-half. Whole or full-fat cream cheese. Whole-fat or sweetened yogurt. Full-fat cheese. Nondairy creamers. Whipped toppings. Processed cheese and cheese spreads. Fats and oils Butter. Stick margarine. Lard. Shortening. Ghee. Bacon fat. Tropical oils, such as coconut, palm kernel, or palm oil. Seasonings and condiments Onion salt, garlic salt, seasoned salt, table salt, and sea salt. Worcestershire sauce. Tartar sauce. Barbecue sauce. Teriyaki sauce. Soy sauce, including reduced-sodium. Steak sauce. Canned and packaged gravies. Fish sauce. Oyster sauce. Cocktail sauce. Store-bought horseradish. Ketchup. Mustard. Meat flavorings and tenderizers. Bouillon cubes. Hot sauces. Pre-made or packaged marinades. Pre-made or packaged taco seasonings. Relishes. Regular salad dressings. Other foods Salted popcorn and pretzels. The items listed above may not be a complete list of foods and beverages you should avoid. Contact a dietitian for more information. Where to find more information National Heart, Lung, and Blood Institute: https://wilson-eaton.com/ American Heart Association: www.heart.org Academy of Nutrition and Dietetics: www.eatright.[place]: www.kidney.org Summary The DASH eating plan is a healthy eating plan that has been shown to reduce high blood pressure (hypertension). It may also reduce your risk for type 2 diabetes, heart disease, and stroke. When on the DASH eating plan, aim to eat more fresh fruits  and vegetables, whole grains, lean proteins, low-fat dairy, and heart-healthy fats. With the DASH eating plan, you should limit salt (sodium) intake to 2,300 mg a day. If you have hypertension, you may need to reduce your sodium intake to 1,500 mg a day. Work with your health care provider or dietitian to adjust your eating  plan to your individual calorie needs. This information is not intended to replace advice given to you by your health care provider. Make sure you discuss any questions you have with your health care provider. Document Revised: 02/12/2019 Document Reviewed: 02/12/2019 Elsevier Patient Education  2023 [employer].

## 2022-04-04 NOTE — Assessment & Plan Note (Signed)
Steroid injection given  Risks were discussed including bleeding, infection, increase in sugars if diabetic, atrophy at site of injection, and increased pain.  After verbal consent was obtained, using sterile technique the  right knee was prepped with alcohol.  The knee joint was entered anteriorly and  Kenalog 40 mg and 5 ml plain Lidocaine was then injected and the needle withdrawn.  The procedure was well tolerated.   The patient is asked to continue to rest the joint for a few more days before resuming regular activities.  It may be more painful for the first 1-2 days.  Watch for fever, or increased swelling or persistent pain in the joint. Call or return to clinic prn if such symptoms occur or there is failure to improve as anticipated.   Referral to ortho

## 2022-04-04 NOTE — Assessment & Plan Note (Signed)
Using walker Falling frequently Referred to home health

## 2022-04-05 LAB — COMPREHENSIVE METABOLIC PANEL
ALT: 15 IU/L (ref 0–32)
AST: 14 IU/L (ref 0–40)
Albumin/Globulin Ratio: 1.4 (ref 1.2–2.2)
Albumin: 4.2 g/dL (ref 3.7–4.7)
Alkaline Phosphatase: 122 IU/L — ABNORMAL HIGH (ref 44–121)
BUN/Creatinine Ratio: 22 (ref 12–28)
BUN: 22 mg/dL (ref 8–27)
Bilirubin Total: 0.3 mg/dL (ref 0.0–1.2)
CO2: 19 mmol/L — ABNORMAL LOW (ref 20–29)
Calcium: 10 mg/dL (ref 8.7–10.3)
Chloride: 105 mmol/L (ref 96–106)
Creatinine, Ser: 1.01 mg/dL — ABNORMAL HIGH (ref 0.57–1.00)
Globulin, Total: 2.9 g/dL (ref 1.5–4.5)
Glucose: 95 mg/dL (ref 70–99)
Potassium: 4.1 mmol/L (ref 3.5–5.2)
Sodium: 141 mmol/L (ref 134–144)
Total Protein: 7.1 g/dL (ref 6.0–8.5)
eGFR: 56 mL/min/{1.73_m2} — ABNORMAL LOW (ref >59)

## 2022-04-05 LAB — CBC WITH DIFFERENTIAL/PLATELET
Basophils Absolute: 0 10*3/uL (ref 0.0–0.2)
Basos: 0 %
EOS (ABSOLUTE): 0.1 10*3/uL (ref 0.0–0.4)
Eos: 1 %
Hematocrit: 36.6 % (ref 34.0–46.6)
Hemoglobin: 11.7 g/dL (ref 11.1–15.9)
Immature Grans (Abs): 0 10*3/uL (ref 0.0–0.1)
Immature Granulocytes: 0 %
Lymphocytes Absolute: 1 10*3/uL (ref 0.7–3.1)
Lymphs: 16 %
MCH: 29.8 pg (ref 26.6–33.0)
MCHC: 32 g/dL (ref 31.5–35.7)
MCV: 93 fL (ref 79–97)
Monocytes Absolute: 0.6 10*3/uL (ref 0.1–0.9)
Monocytes: 10 %
Neutrophils Absolute: 4.6 10*3/uL (ref 1.4–7.0)
Neutrophils: 73 %
Platelets: 322 10*3/uL (ref 150–450)
RBC: 3.93 x10E6/uL (ref 3.77–5.28)
RDW: 12.1 % (ref 11.7–15.4)
WBC: 6.3 10*3/uL (ref 3.4–10.8)

## 2022-04-07 DIAGNOSIS — F331 Major depressive disorder, recurrent, moderate: Secondary | ICD-10-CM | POA: Insufficient documentation

## 2022-04-12 DIAGNOSIS — I69351 Hemiplegia and hemiparesis following cerebral infarction affecting right dominant side: Secondary | ICD-10-CM | POA: Insufficient documentation

## 2022-04-14 DIAGNOSIS — Z9181 History of falling: Secondary | ICD-10-CM | POA: Insufficient documentation

## 2022-04-14 DIAGNOSIS — H409 Unspecified glaucoma: Secondary | ICD-10-CM | POA: Insufficient documentation

## 2022-04-15 DIAGNOSIS — K219 Gastro-esophageal reflux disease without esophagitis: Secondary | ICD-10-CM | POA: Insufficient documentation

## 2022-04-15 DIAGNOSIS — I6529 Occlusion and stenosis of unspecified carotid artery: Secondary | ICD-10-CM | POA: Insufficient documentation

## 2022-04-18 DIAGNOSIS — I639 Cerebral infarction, unspecified: Secondary | ICD-10-CM | POA: Diagnosis not present

## 2022-04-18 DIAGNOSIS — E782 Mixed hyperlipidemia: Secondary | ICD-10-CM | POA: Diagnosis not present

## 2022-04-18 DIAGNOSIS — G8929 Other chronic pain: Secondary | ICD-10-CM | POA: Diagnosis not present

## 2022-04-18 DIAGNOSIS — N183 Chronic kidney disease, stage 3 unspecified: Secondary | ICD-10-CM | POA: Diagnosis not present

## 2022-04-18 DIAGNOSIS — R69 Illness, unspecified: Secondary | ICD-10-CM | POA: Diagnosis not present

## 2022-04-18 DIAGNOSIS — Z9181 History of falling: Secondary | ICD-10-CM | POA: Diagnosis not present

## 2022-04-18 DIAGNOSIS — I129 Hypertensive chronic kidney disease with stage 1 through stage 4 chronic kidney disease, or unspecified chronic kidney disease: Secondary | ICD-10-CM | POA: Diagnosis not present

## 2022-04-18 DIAGNOSIS — I6529 Occlusion and stenosis of unspecified carotid artery: Secondary | ICD-10-CM | POA: Diagnosis not present

## 2022-04-18 DIAGNOSIS — K219 Gastro-esophageal reflux disease without esophagitis: Secondary | ICD-10-CM | POA: Diagnosis not present

## 2022-04-18 DIAGNOSIS — H8110 Benign paroxysmal vertigo, unspecified ear: Secondary | ICD-10-CM | POA: Diagnosis not present

## 2022-04-18 DIAGNOSIS — I69351 Hemiplegia and hemiparesis following cerebral infarction affecting right dominant side: Secondary | ICD-10-CM | POA: Diagnosis not present

## 2022-04-18 DIAGNOSIS — F331 Major depressive disorder, recurrent, moderate: Secondary | ICD-10-CM | POA: Diagnosis not present

## 2022-04-18 DIAGNOSIS — M25561 Pain in right knee: Secondary | ICD-10-CM | POA: Diagnosis not present

## 2022-04-18 DIAGNOSIS — H409 Unspecified glaucoma: Secondary | ICD-10-CM | POA: Diagnosis not present

## 2022-04-23 DIAGNOSIS — R059 Cough, unspecified: Secondary | ICD-10-CM | POA: Diagnosis not present

## 2022-04-25 ENCOUNTER — Telehealth: Payer: Self-pay

## 2022-04-25 ENCOUNTER — Other Ambulatory Visit: Payer: Self-pay

## 2022-04-25 DIAGNOSIS — E559 Vitamin D deficiency, unspecified: Secondary | ICD-10-CM

## 2022-04-25 MED ORDER — D3-50 1.25 MG (50000 UT) PO CAPS: ORAL_CAPSULE | ORAL | 3 refills | Status: DC

## 2022-04-25 NOTE — Progress Notes (Signed)
Care Management & Coordination Services Pharmacy Team  Reason for Encounter: Medication coordination and delivery  Contacted patient to discuss medications and coordinate delivery from [facility]. Spoke with family on 04/26/2022  Cycle dispensing form sent to  for review.   Last adherence delivery date: 04-05-2022  Patient is due for next adherence delivery on: 05-07-2022  This delivery to include: Adherence Packaging  30 Days  Valsartan 80mg  1-2 tabs daily as directed according to systolic BP (Bottles) Trintellix 10mg  1 BT Furosemide 20mg - 1 tab daily (Bottles) Amlodipine 10mg  1 B Vitamin D3 50,000units 1 BT on Tuesdays Brilinta 90mg  1 at B and 1 at BT Famotidine 20mg  1 at B and 1 BT  Patient declined the following medications this month: Praluent 75mg - [facility]ol- OTC Claritn- OTC Latanoprost- Plenty supply B12- OTC  Refills requested from providers include: Vitamin D3  Confirmed delivery date of 05-07-2022, advised patient that pharmacy will contact them the morning of delivery.   Any concerns about your medications? No  How often do you forget or accidentally miss a dose? Never  Do you use a pillbox? No  Is patient in packaging Yes  If yes  What is the date on your next pill pack? 04-27-2022  Any concerns or issues with your packaging? No  Recent blood pressure readings are as follows: Patient's daughter stated blood pressure readings are at patient's house and she will be over there later. Patient's daughter will try and remember to call back with readings and stated she is really trying her best with taking care of her mother and herself but it's becoming overwhelming.  Chart review: Recent office visits:  04-04-2022 Neil Crouch, FNP. Creatinine= 1.01, eGFR= 56, CO2= 19, Alkaline= 122. Referral placed to home health and orthopedic surgery.  Recent consult visits:  04-18-2022 Nat Christen [facility] health)   Hospital visits:  None in previous 6  months  Medications: Outpatient Encounter Medications as of 04/25/2022  Medication Sig   Acetaminophen (TYLENOL) 325 MG CAPS Take 650 mg by mouth every 6 (six) hours as needed (pain).   Alirocumab (PRALUENT) 75 MG/ML SOAJ Inject 1 pen into the skin every 14 (fourteen) days. (Patient taking differently: Inject 75 mg into the skin every 14 (fourteen) days.)   amLODipine (NORVASC) 10 MG tablet Take 1 tablet (10 mg total) by mouth daily.   D3-50 1.25 MG (50000 UT) capsule TAKE 1 CAPSULE BY MOUTH ONE TIME PER WEEK (Patient taking differently: Take 50,000 Units by mouth every Tuesday.)   famotidine (PEPCID) 20 MG tablet Take 1 tablet (20 mg total) by mouth 2 (two) times daily.   furosemide (LASIX) 20 MG tablet Take 1 tablet (20 mg total) by mouth daily as needed for edema.   latanoprost (XALATAN) 0.005 % ophthalmic solution Place 1 drop into both eyes at bedtime.   loratadine (CLARITIN) 10 MG tablet Take 10 mg by mouth daily as needed for allergies.   OVER THE COUNTER MEDICATION Apply 1 application. topically daily as needed (knee pain). Hempvana pain relief cream   ticagrelor (BRILINTA) 90 MG TABS tablet TAKE ONE TABLET BY MOUTH EVERY MORNING and TAKE ONE TABLET BY MOUTH EVERYDAY AT BEDTIME   valsartan (DIOVAN) 80 MG tablet Take 2 tablets (160 mg total) by mouth daily. If systolic BP greater than 675 mmHG give 160 mg by mouth daily, if systolic BP between 916-384 mmHG give 80 mg by mouth daily (Patient taking differently: Take 80 mg by mouth daily. If systolic BP greater than 665  mmHG give 80 mg by mouth daily, if systolic BP between 638-453 mmHG give 40 mg by mouth daily)   vitamin B-12 (CYANOCOBALAMIN) 1000 MCG tablet Take 1,000 mcg by mouth every evening.   vortioxetine HBr (TRINTELLIX) 10 MG TABS tablet TAKE ONE TABLET BY MOUTH EVERYDAY AT BEDTIME   Facility-Administered Encounter Medications as of 04/25/2022  Medication   triamcinolone acetonide (KENALOG-40) injection 40 mg   BP Readings from Last  3 Encounters:  04/04/22 130/70  03/11/22 (!) 161/81  01/14/22 (!) 153/91    Pulse Readings from Last 3 Encounters:  04/04/22 72  03/11/22 73  01/14/22 88    Lab Results  Component Value Date/Time   HGBA1C 5.0 07/05/2021 04:37 AM   HGBA1C 5.6 08/01/2015 02:43 AM   Lab Results  Component Value Date   CREATININE 1.01 (H) 04/04/2022   BUN 22 04/04/2022   GFRNONAA 49 (L) 01/14/2022   GFRAA 59 (L) 05/15/2020   NA 141 04/04/2022   K 4.1 04/04/2022   CALCIUM 10.0 04/04/2022   CO2 19 (L) 04/04/2022   04-25-2022: 1st attempt left VM  [place] Clinical Pharmacist Assistant [phone]

## 2022-04-29 NOTE — Telephone Encounter (Signed)
Compliant on meds 

## 2022-05-07 DIAGNOSIS — E782 Mixed hyperlipidemia: Secondary | ICD-10-CM | POA: Diagnosis not present

## 2022-05-07 DIAGNOSIS — F331 Major depressive disorder, recurrent, moderate: Secondary | ICD-10-CM

## 2022-05-07 DIAGNOSIS — M25561 Pain in right knee: Secondary | ICD-10-CM | POA: Diagnosis not present

## 2022-05-07 DIAGNOSIS — I129 Hypertensive chronic kidney disease with stage 1 through stage 4 chronic kidney disease, or unspecified chronic kidney disease: Secondary | ICD-10-CM | POA: Diagnosis not present

## 2022-05-07 DIAGNOSIS — I639 Cerebral infarction, unspecified: Secondary | ICD-10-CM

## 2022-05-07 DIAGNOSIS — N183 Chronic kidney disease, stage 3 unspecified: Secondary | ICD-10-CM | POA: Diagnosis not present

## 2022-05-07 DIAGNOSIS — G8929 Other chronic pain: Secondary | ICD-10-CM | POA: Diagnosis not present

## 2022-05-07 DIAGNOSIS — I6529 Occlusion and stenosis of unspecified carotid artery: Secondary | ICD-10-CM | POA: Diagnosis not present

## 2022-05-07 DIAGNOSIS — H8110 Benign paroxysmal vertigo, unspecified ear: Secondary | ICD-10-CM | POA: Diagnosis not present

## 2022-05-07 DIAGNOSIS — Z9181 History of falling: Secondary | ICD-10-CM | POA: Diagnosis not present

## 2022-05-07 DIAGNOSIS — R69 Illness, unspecified: Secondary | ICD-10-CM | POA: Diagnosis not present

## 2022-05-07 DIAGNOSIS — H409 Unspecified glaucoma: Secondary | ICD-10-CM | POA: Diagnosis not present

## 2022-05-07 DIAGNOSIS — I69351 Hemiplegia and hemiparesis following cerebral infarction affecting right dominant side: Secondary | ICD-10-CM | POA: Diagnosis not present

## 2022-05-07 DIAGNOSIS — K219 Gastro-esophageal reflux disease without esophagitis: Secondary | ICD-10-CM | POA: Diagnosis not present

## 2022-05-08 ENCOUNTER — Telehealth: Payer: Self-pay | Admitting: Pharmacist

## 2022-05-08 MED ORDER — PRALUENT 75 MG/ML ~~LOC~~ SOAJ: 1.0000 "pen " | SUBCUTANEOUS | 3 refills | Status: DC

## 2022-05-08 NOTE — Telephone Encounter (Signed)
Pt's daughter left message so that we could help re-enroll her in [facility] grant for her Praluent. I have re-enrolled her, grant info below -  CARD # [id number]   BIN [id number]   PCN [id number]   PC GROUP [id number]  Called pt's daughter back to provide her with this info, no answer. Left voicemail stating that I have sent in refill on Praluent with updated grant info, I have also [facility] and confirmed [facility] for $0 copay, and to call back if she would also like grant info for her records (doesn't have MyChart to send info that way).

## 2022-05-20 DIAGNOSIS — I639 Cerebral infarction, unspecified: Secondary | ICD-10-CM

## 2022-05-20 DIAGNOSIS — E782 Mixed hyperlipidemia: Secondary | ICD-10-CM | POA: Diagnosis not present

## 2022-05-20 DIAGNOSIS — I69351 Hemiplegia and hemiparesis following cerebral infarction affecting right dominant side: Secondary | ICD-10-CM | POA: Diagnosis not present

## 2022-05-20 DIAGNOSIS — Z9181 History of falling: Secondary | ICD-10-CM | POA: Diagnosis not present

## 2022-05-20 DIAGNOSIS — H8110 Benign paroxysmal vertigo, unspecified ear: Secondary | ICD-10-CM | POA: Diagnosis not present

## 2022-05-20 DIAGNOSIS — M25561 Pain in right knee: Secondary | ICD-10-CM | POA: Diagnosis not present

## 2022-05-20 DIAGNOSIS — I6529 Occlusion and stenosis of unspecified carotid artery: Secondary | ICD-10-CM | POA: Diagnosis not present

## 2022-05-20 DIAGNOSIS — F331 Major depressive disorder, recurrent, moderate: Secondary | ICD-10-CM

## 2022-05-20 DIAGNOSIS — I129 Hypertensive chronic kidney disease with stage 1 through stage 4 chronic kidney disease, or unspecified chronic kidney disease: Secondary | ICD-10-CM | POA: Diagnosis not present

## 2022-05-20 DIAGNOSIS — K219 Gastro-esophageal reflux disease without esophagitis: Secondary | ICD-10-CM | POA: Diagnosis not present

## 2022-05-20 DIAGNOSIS — G8929 Other chronic pain: Secondary | ICD-10-CM | POA: Diagnosis not present

## 2022-05-20 DIAGNOSIS — R69 Illness, unspecified: Secondary | ICD-10-CM | POA: Diagnosis not present

## 2022-05-20 DIAGNOSIS — N183 Chronic kidney disease, stage 3 unspecified: Secondary | ICD-10-CM | POA: Diagnosis not present

## 2022-05-20 DIAGNOSIS — H409 Unspecified glaucoma: Secondary | ICD-10-CM | POA: Diagnosis not present

## 2022-05-21 ENCOUNTER — Telehealth: Payer: Self-pay

## 2022-05-21 NOTE — Telephone Encounter (Signed)
Contacted [name] to schedule their annual wellness visit. Patient declined to schedule AWV at this time.  [name] states patient had a recent fall and they do not want to schedule AWV at this time.  Norton Blizzard, [place] (AAMA)  [place] [facility] [phone]

## 2022-05-22 DIAGNOSIS — M25561 Pain in right knee: Secondary | ICD-10-CM | POA: Insufficient documentation

## 2022-05-22 DIAGNOSIS — M1711 Unilateral primary osteoarthritis, right knee: Secondary | ICD-10-CM | POA: Insufficient documentation

## 2022-05-24 ENCOUNTER — Telehealth: Payer: Self-pay

## 2022-05-24 ENCOUNTER — Other Ambulatory Visit: Payer: Self-pay

## 2022-05-24 DIAGNOSIS — I10 Essential (primary) hypertension: Secondary | ICD-10-CM

## 2022-05-24 MED ORDER — FUROSEMIDE 20 MG PO TABS: 20.0000 mg | ORAL_TABLET | Freq: Every day | ORAL | 1 refills | Status: DC | PRN

## 2022-05-24 MED ORDER — AMLODIPINE BESYLATE 10 MG PO TABS: 10.0000 mg | ORAL_TABLET | Freq: Every day | ORAL | 1 refills | Status: DC

## 2022-05-24 NOTE — Progress Notes (Signed)
Care Management & Coordination Services Pharmacy Team  Reason for Encounter: Medication coordination and delivery  Contacted patient to discuss medications and coordinate delivery from [facility]. Spoke with family on 05/24/2022  Cycle dispensing form sent to [place] for review.   Last adherence delivery date: 05-07-2022   Patient is due for next adherence delivery on: 06-05-2022  This delivery to include: Adherence Packaging  30 Days  Valsartan '80mg'$  1-2 tabs daily as directed according to systolic BP (Bottles) Trintellix '10mg'$  1 BT Furosemide '20mg'$ - 1 tab daily (Bottles) Amlodipine '10mg'$  1 B Vitamin D3 50,000units 1 BT on Tuesdays Brilinta '90mg'$  1 at B and 1 at BT Famotidine '20mg'$  1 at B and 1 BT  Patient declined the following medications this month: Praluent '75mg'$ - [facility]- OTC Claritn- OTC Latanoprost- Plenty supply B12- OTC  Refills requested from providers include: Furosemide  Amlodipine   Confirmed delivery date of 06-05-2022, advised patient that pharmacy will contact them the morning of delivery.   Any concerns about your medications? No  How often do you forget or accidentally miss a dose? Never  Do you use a pillbox? No  Is patient in packaging Yes  If yes  What is the date on your next pill pack? 05-25-2022  Any concerns or issues with your packaging? No   Recent blood pressure readings are as follows: 03-01 169/96, 02-29 156/74   Chart review: Recent office visits:  None  Recent consult visits:  None  Hospital visits:  None in previous 6 months  Medications: Outpatient Encounter Medications as of 05/24/2022  Medication Sig   Acetaminophen (TYLENOL) 325 MG CAPS Take 650 mg by mouth every 6 (six) hours as needed (pain).   Alirocumab (PRALUENT) 75 MG/ML SOAJ Inject 1 pen  into the skin every 14 (fourteen) days.   amLODipine (NORVASC) 10 MG tablet Take 1 tablet (10 mg total) by mouth daily.   Cholecalciferol (D3-50) 1.25 MG  (50000 UT) capsule TAKE 1 CAPSULE BY MOUTH ONE TIME PER WEEK Strength: 1.25 MG (50000 UT)   famotidine (PEPCID) 20 MG tablet Take 1 tablet (20 mg total) by mouth 2 (two) times daily.   furosemide (LASIX) 20 MG tablet Take 1 tablet (20 mg total) by mouth daily as needed for edema.   latanoprost (XALATAN) 0.005 % ophthalmic solution Place 1 drop into both eyes at bedtime.   loratadine (CLARITIN) 10 MG tablet Take 10 mg by mouth daily as needed for allergies.   OVER THE COUNTER MEDICATION Apply 1 application. topically daily as needed (knee pain). Hempvana pain relief cream   ticagrelor (BRILINTA) 90 MG TABS tablet TAKE ONE TABLET BY MOUTH EVERY MORNING and TAKE ONE TABLET BY MOUTH EVERYDAY AT BEDTIME   valsartan (DIOVAN) 80 MG tablet Take 2 tablets (160 mg total) by mouth daily. If systolic BP greater than [id number] mmHG give 160 mg by mouth daily, if systolic BP between 0000000 mmHG give 80 mg by mouth daily (Patient taking differently: Take 80 mg by mouth daily. If systolic BP greater than [id number] mmHG give 80 mg by mouth daily, if systolic BP between 0000000 mmHG give 40 mg by mouth daily)   vitamin B-12 (CYANOCOBALAMIN) 1000 MCG tablet Take 1,000 mcg by mouth every evening.   vortioxetine HBr (TRINTELLIX) 10 MG TABS tablet TAKE ONE TABLET BY MOUTH EVERYDAY AT BEDTIME   Facility-Administered Encounter Medications as of 05/24/2022  Medication   triamcinolone acetonide (KENALOG-40) injection 40 mg   BP Readings from Last 3 Encounters:  04/04/22 130/70  03/11/22 (!) 161/81  01/14/22 (!) 153/91    Pulse Readings from Last 3 Encounters:  04/04/22 72  03/11/22 73  01/14/22 88    Lab Results  Component Value Date/Time   HGBA1C 5.0 07/05/2021 04:37 AM   HGBA1C 5.6 08/01/2015 02:43 AM   Lab Results  Component Value Date   CREATININE 1.01 (H) 04/04/2022   BUN 22 04/04/2022   GFRNONAA 49 (L) 01/14/2022   GFRAA 59 (L) 05/15/2020   NA 141 04/04/2022   K 4.1 04/04/2022   CALCIUM 10.0 04/04/2022   CO2  19 (L) 04/04/2022     [place] Clinical Pharmacist Assistant [phone]

## 2022-05-27 NOTE — Telephone Encounter (Signed)
Compliant on meds 

## 2022-05-29 DIAGNOSIS — I2510 Atherosclerotic heart disease of native coronary artery without angina pectoris: Secondary | ICD-10-CM | POA: Diagnosis not present

## 2022-05-29 DIAGNOSIS — I509 Heart failure, unspecified: Secondary | ICD-10-CM | POA: Diagnosis not present

## 2022-05-29 DIAGNOSIS — Z008 Encounter for other general examination: Secondary | ICD-10-CM | POA: Diagnosis not present

## 2022-05-29 DIAGNOSIS — R32 Unspecified urinary incontinence: Secondary | ICD-10-CM | POA: Diagnosis not present

## 2022-05-29 DIAGNOSIS — M1990 Unspecified osteoarthritis, unspecified site: Secondary | ICD-10-CM | POA: Diagnosis not present

## 2022-05-29 DIAGNOSIS — J309 Allergic rhinitis, unspecified: Secondary | ICD-10-CM | POA: Diagnosis not present

## 2022-05-29 DIAGNOSIS — R269 Unspecified abnormalities of gait and mobility: Secondary | ICD-10-CM | POA: Diagnosis not present

## 2022-05-29 DIAGNOSIS — I739 Peripheral vascular disease, unspecified: Secondary | ICD-10-CM | POA: Diagnosis not present

## 2022-05-29 DIAGNOSIS — K219 Gastro-esophageal reflux disease without esophagitis: Secondary | ICD-10-CM | POA: Diagnosis not present

## 2022-05-29 DIAGNOSIS — G629 Polyneuropathy, unspecified: Secondary | ICD-10-CM | POA: Diagnosis not present

## 2022-05-29 DIAGNOSIS — E785 Hyperlipidemia, unspecified: Secondary | ICD-10-CM | POA: Diagnosis not present

## 2022-05-29 DIAGNOSIS — N1831 Chronic kidney disease, stage 3a: Secondary | ICD-10-CM | POA: Diagnosis not present

## 2022-05-29 DIAGNOSIS — E669 Obesity, unspecified: Secondary | ICD-10-CM | POA: Diagnosis not present

## 2022-06-11 ENCOUNTER — Telehealth: Payer: Self-pay

## 2022-06-11 NOTE — Progress Notes (Cosign Needed)
error 

## 2022-06-24 ENCOUNTER — Telehealth: Payer: Self-pay

## 2022-06-24 NOTE — Progress Notes (Cosign Needed Addendum)
Care Management & Coordination Services Pharmacy Team  Reason for Encounter: Medication coordination and delivery  Contacted patient to discuss medications and coordinate delivery from [facility]. Spoke with family on 06/24/2022  Cycle dispensing form sent to [name] for review.   Last adherence delivery date: 06-05-2022  Patient is due for next adherence delivery on: 07-04-2022  This delivery to include: Adherence Packaging  30 Days  Valsartan 80mg  1-2 tabs daily as directed according to systolic BP (Bottles) Trintellix 10mg  1 BT Amlodipine 10mg  1 B Vitamin D3 50,000units 1 BT on Tuesdays Brilinta 90mg  1 at B and 1 at BT Famotidine 20mg  1 at B and 1 BT  Patient declined the following medications this month: Furosemide- Plenty supply Praluent 75mg - [facility]- OTC Claritn- OTC Latanoprost- Plenty supply B12- OTC  Refills requested from providers include: Valsartan Brilinta  Confirmed delivery date of 07-04-2022, advised patient that pharmacy will contact them the morning of delivery.   Any concerns about your medications? No  How often do you forget or accidentally miss a dose? Never  Do you use a pillbox? No  Is patient in packaging Yes  If yes  What is the date on your next pill pack? 06-25-2022  Any concerns or issues with your packaging? no   Recent blood pressure readings are as follows: 04-01 9:45 am 161/81 71 9:15 pm 151/75 75, 03-31 10am 158/73 74 9:30 pm 143/83 85, 03-30 9:30 am 162/81 69 9:15 am 141/71 82 9:30 pm 135/69 83, 03-29 9:35 am 166/82 75 10:10 am when nurse came 161/85 74 9:40 pm 159/73 78.  Chart review: Recent office visits:  None  Recent consult visits:  None  Hospital visits:  None in previous 6 months  Medications: Outpatient Encounter Medications as of 06/24/2022  Medication Sig   Acetaminophen (TYLENOL) 325 MG CAPS Take 650 mg by mouth every 6 (six) hours as needed (pain).   Alirocumab (PRALUENT) 75 MG/ML  SOAJ Inject 1 pen  into the skin every 14 (fourteen) days.   amLODipine (NORVASC) 10 MG tablet Take 1 tablet (10 mg total) by mouth daily.   Cholecalciferol (D3-50) 1.25 MG (50000 UT) capsule TAKE 1 CAPSULE BY MOUTH ONE TIME PER WEEK Strength: 1.25 MG (50000 UT)   famotidine (PEPCID) 20 MG tablet Take 1 tablet (20 mg total) by mouth 2 (two) times daily.   furosemide (LASIX) 20 MG tablet Take 1 tablet (20 mg total) by mouth daily as needed for edema.   latanoprost (XALATAN) 0.005 % ophthalmic solution Place 1 drop into both eyes at bedtime.   loratadine (CLARITIN) 10 MG tablet Take 10 mg by mouth daily as needed for allergies.   OVER THE COUNTER MEDICATION Apply 1 application. topically daily as needed (knee pain). Hempvana pain relief cream   ticagrelor (BRILINTA) 90 MG TABS tablet TAKE ONE TABLET BY MOUTH EVERY MORNING and TAKE ONE TABLET BY MOUTH EVERYDAY AT BEDTIME   valsartan (DIOVAN) 80 MG tablet Take 2 tablets (160 mg total) by mouth daily. If systolic BP greater than 140 mmHG give 160 mg by mouth daily, if systolic BP between 120-140 mmHG give 80 mg by mouth daily (Patient taking differently: Take 80 mg by mouth daily. If systolic BP greater than 140 mmHG give 80 mg by mouth daily, if systolic BP between 120-140 mmHG give 40 mg by mouth daily)   vitamin B-12 (CYANOCOBALAMIN) 1000 MCG tablet Take 1,000 mcg by mouth every evening.   vortioxetine HBr (TRINTELLIX) 10 MG TABS tablet  TAKE ONE TABLET BY MOUTH EVERYDAY AT BEDTIME   Facility-Administered Encounter Medications as of 06/24/2022  Medication   triamcinolone acetonide (KENALOG-40) injection 40 mg   BP Readings from Last 3 Encounters:  04/04/22 130/70  03/11/22 (!) 161/81  01/14/22 (!) 153/91    Pulse Readings from Last 3 Encounters:  04/04/22 72  03/11/22 73  01/14/22 88    Lab Results  Component Value Date/Time   HGBA1C 5.0 07/05/2021 04:37 AM   HGBA1C 5.6 08/01/2015 02:43 AM   Lab Results  Component Value Date   CREATININE  1.01 (H) 04/04/2022   BUN 22 04/04/2022   GFRNONAA 49 (L) 01/14/2022   GFRAA 59 (L) 05/15/2020   NA 141 04/04/2022   K 4.1 04/04/2022   CALCIUM 10.0 04/04/2022   CO2 19 (L) 04/04/2022  06-24-2022: Patient's daughter will call tomorrow with more blood pressures   [place] [facility] Clinical Pharmacist Assistant [phone]

## 2022-06-26 DIAGNOSIS — M1711 Unilateral primary osteoarthritis, right knee: Secondary | ICD-10-CM | POA: Diagnosis not present

## 2022-07-01 ENCOUNTER — Other Ambulatory Visit: Payer: Self-pay

## 2022-07-01 DIAGNOSIS — I10 Essential (primary) hypertension: Secondary | ICD-10-CM

## 2022-07-02 ENCOUNTER — Other Ambulatory Visit: Payer: Self-pay | Admitting: Family Medicine

## 2022-07-02 DIAGNOSIS — I6930 Unspecified sequelae of cerebral infarction: Secondary | ICD-10-CM

## 2022-07-02 DIAGNOSIS — I639 Cerebral infarction, unspecified: Secondary | ICD-10-CM

## 2022-07-02 DIAGNOSIS — I10 Essential (primary) hypertension: Secondary | ICD-10-CM

## 2022-07-02 MED ORDER — VALSARTAN 80 MG PO TABS: 160.0000 mg | ORAL_TABLET | Freq: Every day | ORAL | 3 refills | Status: DC

## 2022-07-02 NOTE — Telephone Encounter (Signed)
Tried calling patient again about BP meds. States it's a little low this week. Doesn't want to change things yet. Recommended patient to bring in all BP readings next week for f/u visit. Patient had Prednisone shot the other week and wants to wait to change things

## 2022-07-03 ENCOUNTER — Telehealth: Payer: Self-pay

## 2022-07-03 ENCOUNTER — Other Ambulatory Visit: Payer: Self-pay | Admitting: Physician Assistant

## 2022-07-03 DIAGNOSIS — I6930 Unspecified sequelae of cerebral infarction: Secondary | ICD-10-CM

## 2022-07-03 DIAGNOSIS — I639 Cerebral infarction, unspecified: Secondary | ICD-10-CM

## 2022-07-03 MED ORDER — TICAGRELOR 90 MG PO TABS: ORAL_TABLET | ORAL | 0 refills | Status: DC

## 2022-07-03 NOTE — Telephone Encounter (Signed)
[facility] called, patient was recommended by her neurologist to stop taking blood thinner medication but patient called her PCP needing a refill tomorrow as her pharmacy is a mail order. PCP office is needing clarification to send this in. Patient will be out tomorrow.

## 2022-07-03 NOTE — Telephone Encounter (Signed)
Nurse at patient PCP office was advised of Dr.Hill note,  Is this regarding Brilinta? Per Dr. Moises Blood note, he wants patient on this and did not tell her to stop it.  Per nurse patient was given a 30 day refill until she can get a follow up with Dr.Jaffe to Continue to refill medication.  Advised nurse DR.Everlena Cooper is out of the office this week but I will check and see if patient needs to follow up with him or have the PCP .

## 2022-07-03 NOTE — Telephone Encounter (Signed)
[facility] called and stated patient needs a refill sent for [facility], stated that they have sent refills but it keeps getting denied stating refills not appropriate. She has an appointment 4/16. Please advise.

## 2022-07-03 NOTE — Telephone Encounter (Signed)
Called patient daughter and made her aware that [facility] was [facility] to [facility] and made her aware they will need to call Dr. Everlena Cooper office and get a follow up appointment, he will need to refill this medication in the future.

## 2022-07-03 NOTE — Progress Notes (Cosign Needed)
07-03-2022: Received message from [facility] that brilinta request was denied "refill not appropriate"and valsartan refills are still needed. Sent message to pool for refill request and to clarify if patient is still on Brilinta. No response but Valsartan was sent in on 07-02-2022. Left VM on nurse line and Sent message to Artelia Laroche to check into this since delivery is 07-04-2022.  Huey Romans [facility] Clinical Pharmacist Assistant [phone]

## 2022-07-04 NOTE — Telephone Encounter (Signed)
Daughter LVM asking if they needed to schedule an appointment. Returned call and LVM advising Dr.Jaffe is out of the office this week and will call them with an update regarding if they need to come in for an appointment.

## 2022-07-09 ENCOUNTER — Ambulatory Visit (INDEPENDENT_AMBULATORY_CARE_PROVIDER_SITE_OTHER): Payer: Medicare HMO | Admitting: Physician Assistant

## 2022-07-09 ENCOUNTER — Encounter: Payer: Self-pay | Admitting: Physician Assistant

## 2022-07-09 VITALS — BP 148/72 | HR 69 | Temp 97.3°F | Ht 60.0 in | Wt 238.0 lb

## 2022-07-09 DIAGNOSIS — I129 Hypertensive chronic kidney disease with stage 1 through stage 4 chronic kidney disease, or unspecified chronic kidney disease: Secondary | ICD-10-CM

## 2022-07-09 DIAGNOSIS — R0789 Other chest pain: Secondary | ICD-10-CM | POA: Diagnosis not present

## 2022-07-09 DIAGNOSIS — N183 Chronic kidney disease, stage 3 unspecified: Secondary | ICD-10-CM

## 2022-07-09 DIAGNOSIS — F331 Major depressive disorder, recurrent, moderate: Secondary | ICD-10-CM

## 2022-07-09 DIAGNOSIS — H409 Unspecified glaucoma: Secondary | ICD-10-CM

## 2022-07-09 DIAGNOSIS — H8110 Benign paroxysmal vertigo, unspecified ear: Secondary | ICD-10-CM

## 2022-07-09 DIAGNOSIS — R69 Illness, unspecified: Secondary | ICD-10-CM | POA: Diagnosis not present

## 2022-07-09 DIAGNOSIS — I6529 Occlusion and stenosis of unspecified carotid artery: Secondary | ICD-10-CM

## 2022-07-09 DIAGNOSIS — Z9181 History of falling: Secondary | ICD-10-CM

## 2022-07-09 DIAGNOSIS — I69351 Hemiplegia and hemiparesis following cerebral infarction affecting right dominant side: Secondary | ICD-10-CM | POA: Diagnosis not present

## 2022-07-09 DIAGNOSIS — G8929 Other chronic pain: Secondary | ICD-10-CM | POA: Diagnosis not present

## 2022-07-09 DIAGNOSIS — M25561 Pain in right knee: Secondary | ICD-10-CM

## 2022-07-09 DIAGNOSIS — R0782 Intercostal pain: Secondary | ICD-10-CM | POA: Diagnosis not present

## 2022-07-09 DIAGNOSIS — K219 Gastro-esophageal reflux disease without esophagitis: Secondary | ICD-10-CM

## 2022-07-09 DIAGNOSIS — E782 Mixed hyperlipidemia: Secondary | ICD-10-CM

## 2022-07-10 ENCOUNTER — Ambulatory Visit: Payer: Medicare HMO | Admitting: Nurse Practitioner

## 2022-07-10 NOTE — Progress Notes (Signed)
Subjective:  Patient ID: [name], female    DOB: [birth year]  Age: 82 y.o. MRN: [id number]  Chief Complaint  Patient presents with   Hypertension    HPI  Pt was scheduled for chronic follow up for first visit with me however daughter states that for the past 2 weeks pt has been complaining of intermittent chest pain.  She describes as a dull type pain behind left breast and radiates to her back.  States nothing seems to make better or worse.  She denies any issues with GERD and eating does not cause pain.  She has had some more fatigue than usual as well. She does see cardiology -Dr Eden Emms - daughter at first says she had appt last month with him and an echo recently however the last appt I see in [facility] was in the fall and last echo one year ago -- Daughter then states she is 'unsure and has too much to keep up with' Pt with history of stroke and CAD      04/04/2022   11:51 AM 11/07/2021    2:10 PM 02/06/2021    2:15 PM 12/18/2020   11:01 AM 09/12/2020   10:06 AM  Depression screen PHQ 2/9  Decreased Interest 2 0 Down, Depressed, Hopeless 0 0 3 0 1  PHQ - 2 Score 2 0 Altered sleeping 1 0 Tired, decreased energy 2 0 Change in appetite 1 0 0 2 0  Feeling bad or failure about yourself  1 0 0 0 0  Trouble concentrating 2 0 0 0 0  Moving slowly or fidgety/restless 1 0 2 0 0  Suicidal thoughts 0 0 0 0 0  PHQ-9 Score 10 0 Difficult doing work/chores Somewhat difficult Not difficult at all Not difficult at all Somewhat difficult Not difficult at all         07/05/2021    8:00 PM 07/06/2021   10:24 AM 07/16/2021    1:24 PM 01/14/2022    5:54 PM 03/11/2022   10:01 AM  Fall Risk  Falls in the past year?   0  1  Was there an injury with Fall?   0  1  Fall Risk Category Calculator   0  2  Fall Risk Category (Retired)   Low  Moderate  (RETIRED) Patient Fall Risk Level High fall risk High fall risk  High fall risk Moderate fall risk  Fall risk  Follow up     Falls evaluation completed      Review of Systems CONSTITUTIONAL: see HPI E/N/T: Negative for ear pain, nasal congestion and sore throat.  CARDIOVASCULAR: see HPI RESPIRATORY: Negative for recent cough and dyspnea.  GASTROINTESTINAL: Negative for abdominal pain, acid reflux symptoms, constipation, diarrhea, nausea and vomiting.  INTEGUMENTARY: Negative for rash.  NEUROLOGICAL: Negative for dizziness and headaches.  PSYCHIATRIC: Negative for sleep disturbance and to question depression screen.  Negative for depression, negative for anhedonia.      Current Outpatient Medications on File Prior to Visit  Medication Sig Dispense Refill   Acetaminophen (TYLENOL) 325 MG CAPS Take 650 mg by mouth every 6 (six) hours as needed (pain).     Alirocumab (PRALUENT) 75 MG/ML SOAJ Inject 1 pen  into the skin every 14 (fourteen) days. 6 mL 3   amLODipine (NORVASC) 10 MG tablet Take 1 tablet (10 mg total) by mouth daily. 90 tablet  1   Cholecalciferol (D3-50) 1.25 MG (50000 UT) capsule TAKE 1 CAPSULE BY MOUTH ONE TIME PER WEEK Strength: 1.25 MG (50000 UT) 12 capsule 3   famotidine (PEPCID) 20 MG tablet Take 1 tablet (20 mg total) by mouth 2 (two) times daily. 180 tablet 2   furosemide (LASIX) 20 MG tablet Take 1 tablet (20 mg total) by mouth daily as needed for edema. 90 tablet 1   latanoprost (XALATAN) 0.005 % ophthalmic solution Place 1 drop into both eyes at bedtime.     loratadine (CLARITIN) 10 MG tablet Take 10 mg by mouth daily as needed for allergies.     OVER THE COUNTER MEDICATION Apply 1 application. topically daily as needed (knee pain). Hempvana pain relief cream     ticagrelor (BRILINTA) 90 MG TABS tablet TAKE ONE TABLET BY MOUTH EVERY MORNING and TAKE ONE TABLET BY MOUTH EVERYDAY AT BEDTIME 60 tablet 0   valsartan (DIOVAN) 80 MG tablet Take 2 tablets (160 mg total) by mouth daily. If systolic BP greater than 140 mmHG give 160 mg by mouth daily, if systolic BP between 120-140 mmHG  give 80 mg by mouth daily 180 tablet 3   vitamin B-12 (CYANOCOBALAMIN) 1000 MCG tablet Take 1,000 mcg by mouth every evening.     vortioxetine HBr (TRINTELLIX) 10 MG TABS tablet TAKE ONE TABLET BY MOUTH EVERYDAY AT BEDTIME 90 tablet 1   No current facility-administered medications on file prior to visit.   Past Medical History:  Diagnosis Date   Acute embolic stroke    Benign paroxysmal vertigo, unspecified ear 06/07/2019   CKD (chronic kidney disease), stage III    GERD (gastroesophageal reflux disease)    Glaucoma    Incisional infection 12/25/2020   Past Surgical History:  Procedure Laterality Date   ABDOMINAL HYSTERECTOMY     BACK SURGERY     cataract surgery     COLONOSCOPY N/A 08/03/2015   Procedure: COLONOSCOPY;  Surgeon: Carman Ching, MD;  Location: [facility] ENDOSCOPY;  Service: Endoscopy;  Laterality: N/A;   ESOPHAGOGASTRODUODENOSCOPY N/A 07/31/2015   Procedure: ESOPHAGOGASTRODUODENOSCOPY (EGD);  Surgeon: Carman Ching, MD;  Location: [facility] ENDOSCOPY;  Service: Endoscopy;  Laterality: N/A;   IR ANGIO INTRA EXTRACRAN SEL COM CAROTID INNOMINATE BILAT MOD SED  06/01/2021   IR ANGIO VERTEBRAL SEL SUBCLAVIAN INNOMINATE UNI L MOD SED  06/01/2021   IR RADIOLOGIST EVAL & MGMT  04/26/2021   OOPHORECTOMY     TOTAL HIP ARTHROPLASTY     URETER SURGERY      Family History  Problem Relation Age of Onset   Hypertension Mother    Hypertension Father    Heart attack Father    Hypertension Brother    Diabetes Brother    Social History   Socioeconomic History   Marital status: Married    Spouse name: Not on file   Number of children: Not on file   Years of education: Not on file   Highest education level: Not on file  Occupational History   Not on file  Tobacco Use   Smoking status: Never   Smokeless tobacco: Never  Vaping Use   Vaping Use: Never used  Substance and Sexual Activity   Alcohol use: No   Drug use: No   Sexual activity: Not Currently  Other Topics Concern   Not on file   Social History Narrative   Not on file   Social Determinants of Health   Financial Resource Strain: Low Risk  (11/06/2021)   Overall Financial  Resource Strain (CARDIA)    Difficulty of Paying Living Expenses: Not hard at all  Food Insecurity: No Food Insecurity (11/06/2021)   Hunger Vital Sign    Worried About Running Out of Food in the Last Year: Never true    Ran Out of Food in the Last Year: Never true  Transportation Needs: No Transportation Needs (11/06/2021)   PRAPARE - Administrator, Civil Service (Medical): No    Lack of Transportation (Non-Medical): No  Physical Activity: Insufficiently Active (07/12/2021)   Exercise Vital Sign    Days of Exercise per Week: 2 days    Minutes of Exercise per Session: 30 min  Stress: Not on file  Social Connections: Moderately Integrated (11/06/2021)   Social Connection and Isolation Panel [NHANES]    Frequency of Communication with Friends and Family: Never    Frequency of Social Gatherings with Friends and Family: More than three times a week    Attends Religious Services: Never    Database administrator or Organizations: Yes    Attends Banker Meetings: Never    Marital Status: Married    Objective:  PHYSICAL EXAM:   VS: BP (!) 148/72 (BP Location: Left Arm, Patient Position: Sitting)   Pulse 69   Temp (!) 97.3 F (36.3 C) (Temporal)   Ht 5' (1.524 m)   Wt 238 lb (108 kg)   SpO2 96%   BMI 46.48 kg/m   GEN: Well nourished, well developed, in no acute distress - in wheelchair Cardiac: RRR; no murmurs, rubs, or gallops,no edema -  Respiratory:  normal respiratory rate and pattern with no distress - normal breath sounds with no rales, rhonchi, wheezes or rubs Skin: warm and dry, no rash  Neuro:  Alert and Oriented x 3, - CN II-Xii grossly intact Psych: euthymic mood, appropriate affect and demeanor  EKG - no acute changes Lab Results  Component Value Date   WBC 6.3 04/04/2022   HGB 11.7 04/04/2022    HCT 36.6 04/04/2022   PLT 322 04/04/2022   GLUCOSE 95 04/04/2022   CHOL 287 (H) 11/07/2021   TRIG 439 (H) 11/07/2021   HDL 38 (L) 11/07/2021   LDLCALC 164 (H) 11/07/2021   ALT 15 04/04/2022   AST 14 04/04/2022   NA 141 04/04/2022   K 4.1 04/04/2022   CL 105 04/04/2022   CREATININE 1.01 (H) 04/04/2022   BUN 22 04/04/2022   CO2 19 (L) 04/04/2022   TSH 1.210 11/07/2021   INR 1.0 06/01/2021   HGBA1C 5.0 07/05/2021      Assessment & Plan:    Other chest pain -     Ambulatory referral to Cardiology -     CBC with Differential/Platelet -     Comprehensive metabolic panel -     Troponin I -  Labs were ordered stat at [facility] ---  Troponin was negative --- will refer pt back to Dr Eden Emms for further evaluation and management of chest pain Pt to follow up in our office 4 weeks for fasting appt   No orders of the defined types were placed in this encounter.   Orders Placed This Encounter  Procedures   CBC with Differential/Platelet   Comprehensive metabolic panel   Troponin I   Ambulatory referral to Cardiology     Follow-up: Return in about 4 weeks (around 08/06/2022) for fasting with me or Dr Sedalia Muta.   I

## 2022-07-11 ENCOUNTER — Telehealth: Payer: Self-pay | Admitting: Anesthesiology

## 2022-07-11 ENCOUNTER — Telehealth: Payer: Self-pay | Admitting: Neurology

## 2022-07-11 LAB — TROPONIN I: Troponin-I: <0.01

## 2022-07-11 MED ORDER — TICAGRELOR 90 MG PO TABS: ORAL_TABLET | ORAL | 1 refills | Status: DC

## 2022-07-11 NOTE — Telephone Encounter (Signed)
  She has an appointment already scheduled for June. We can provide her refills of Brilinta until then.  LMOVM script sent.

## 2022-07-11 NOTE — Addendum Note (Signed)
Addended by: Leida Lauth on: 07/11/2022 03:05 PM   Modules accepted: Orders

## 2022-07-11 NOTE — Telephone Encounter (Signed)
Med was reordered 4/18

## 2022-07-11 NOTE — Telephone Encounter (Signed)
Left message with the after hour service on 07-11-22    Would like to know if a RX was called [facility] to [facility]. Was not sure if we called it in or her PCP  did.   Brilinta 90   And wants to know if patient needs to be seen again

## 2022-07-11 NOTE — Telephone Encounter (Signed)
Pt's daughter called stating pt needs a refill on her Brilinta 90 mg, 90 day supply. Needs to go to [facility].

## 2022-07-15 ENCOUNTER — Telehealth: Payer: Self-pay

## 2022-07-15 NOTE — Progress Notes (Deleted)
CARDIOLOGY OFFICE NOTE  Date:  07/15/2022    [name] Date of Birth: Nov 21, 1940 Medical Record #[id number]  PCP:  Diane Sofia, PA-C  Cardiologist:  [name]    No chief complaint on file.   History of Present Illness:  82 y.o. History of HTN, LBBB, HLD. 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 Echo with EF 60-65% MRI with multiple infarcts in pons, cerebellar peduncle and significant intracranial vascular disease. Neuro suggested Permissive HTN and ASA/Plavix. 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 Intolerant to statins And was on niacin. Carotids have been variable but most recent duplex 06/20/20   only 40-59% LICA stenosis   Still with difficulty walking due to stroke and bad arthritic knees  She has had a SEM and echo 05/25/19 showed AV sclerosis with mean gradient only 10 mmhg and AVA> 2 cm2  Admitted to [facility] August 2021 with dehydration , syncope and elevated troponin. Did fine with hydration ECG with chronic LBBB and echo with normal EF no RWAM's or valve dx and no pericardial effusion   She takes Effexor for depression   Discussed need to Rx LdL which is over 200 started on Praluent   Has some LLE swelling and pain  RLE [place] negative for DVT 06/04/21 TTE 07/05/21 EF 70-75% NOrmal RV no significant valve DX  Feels generally poorly On praluent and Brillinta for continued stroke Complained to [name] of chest pain on office visit 07/09/22 Dull ache behind left breast going to back.   ***    Past Medical History:  Diagnosis Date   Acute embolic stroke    Benign paroxysmal vertigo, unspecified ear 06/07/2019   CKD (chronic kidney disease), stage III    GERD (gastroesophageal reflux disease)    Glaucoma    Incisional infection 12/25/2020    Past Surgical History:  Procedure Laterality Date   ABDOMINAL HYSTERECTOMY     BACK SURGERY     cataract surgery     COLONOSCOPY N/A 08/03/2015   Procedure:  COLONOSCOPY;  Surgeon: Diane Ching, MD;  Location: [facility] ENDOSCOPY;  Service: Endoscopy;  Laterality: N/A;   ESOPHAGOGASTRODUODENOSCOPY N/A 07/31/2015   Procedure: ESOPHAGOGASTRODUODENOSCOPY (EGD);  Surgeon: Diane Ching, MD;  Location: [facility] ENDOSCOPY;  Service: Endoscopy;  Laterality: N/A;   IR ANGIO INTRA EXTRACRAN SEL COM CAROTID INNOMINATE BILAT MOD SED  06/01/2021   IR ANGIO VERTEBRAL SEL SUBCLAVIAN INNOMINATE UNI L MOD SED  06/01/2021   IR RADIOLOGIST EVAL & MGMT  04/26/2021   OOPHORECTOMY     TOTAL HIP ARTHROPLASTY     URETER SURGERY       Medications: Current Outpatient Medications  Medication Sig Dispense Refill   Acetaminophen (TYLENOL) 325 MG CAPS Take 650 mg by mouth every 6 (six) hours as needed (pain).     Alirocumab (PRALUENT) 75 MG/ML SOAJ Inject 1 pen  into the skin every 14 (fourteen) days. 6 mL 3   amLODipine (NORVASC) 10 MG tablet Take 1 tablet (10 mg total) by mouth daily. 90 tablet 1   Cholecalciferol (D3-50) 1.25 MG (50000 UT) capsule TAKE 1 CAPSULE BY MOUTH ONE TIME PER WEEK Strength: 1.25 MG (50000 UT) 12 capsule 3   famotidine (PEPCID) 20 MG tablet Take 1 tablet (20 mg total) by mouth 2 (two) times daily. 180 tablet 2   furosemide (LASIX) 20 MG tablet Take 1 tablet (20 mg total) by mouth daily as needed for edema. 90 tablet 1   latanoprost (  XALATAN) 0.005 % ophthalmic solution Place 1 drop into both eyes at bedtime.     loratadine (CLARITIN) 10 MG tablet Take 10 mg by mouth daily as needed for allergies.     OVER THE COUNTER MEDICATION Apply 1 application. topically daily as needed (knee pain). Hempvana pain relief cream     ticagrelor (BRILINTA) 90 MG TABS tablet TAKE ONE TABLET BY MOUTH EVERY MORNING and TAKE ONE TABLET BY MOUTH EVERYDAY AT BEDTIME 60 tablet 1   valsartan (DIOVAN) 80 MG tablet Take 2 tablets (160 mg total) by mouth daily. If systolic BP greater than 140 mmHG give 160 mg by mouth daily, if systolic BP between 120-140 mmHG give 80 mg by mouth daily 180  tablet 3   vitamin B-12 (CYANOCOBALAMIN) 1000 MCG tablet Take 1,000 mcg by mouth every evening.     vortioxetine HBr (TRINTELLIX) 10 MG TABS tablet TAKE ONE TABLET BY MOUTH EVERYDAY AT BEDTIME 90 tablet 1   No current facility-administered medications for this visit.    Allergies: Allergies  Allergen Reactions   Codeine Nausea And Vomiting   Gabapentin     Dizziness, couldn't function    Meclizine Other (See Comments)    Lethargy, extreme sleepiness   Requip [Ropinirole Hcl] Nausea And Vomiting   Statins     Muscle weakness    Social History: The patient  reports that she has never smoked. She has never used smokeless tobacco. She reports that she does not drink alcohol and does not use drugs.   Family History: The patient's family history includes Diabetes in her brother; Heart attack in her father; Hypertension in her brother, father, and mother.   Review of Systems: Please see the history of present illness.   Otherwise, the review of systems is positive for none.   All other systems are reviewed and negative.   Physical Exam: VS:  There were no vitals taken for this visit. [name]  BMI There is no height or weight on file to calculate BMI.  Wt Readings from Last 3 Encounters:  07/09/22 238 lb (108 kg)  04/04/22 157 lb (71.2 kg)  01/14/22 152 lb (68.9 kg)    Affect appropriate Healthy:  appears stated age HEENT: normal Neck supple with no adenopathy JVP normal bilateral carotid  Bruits high pitched on left  no thyromegaly Lungs clear with no wheezing and good diaphragmatic motion Heart:  S1/S2  SEM murmur, no rub, gallop or click PMI normal Abdomen: benighn, BS positve, no tenderness, no AAA no bruit.  No HSM or HJR Distal pulses intact with no bruits No edema Neuro non-focal Skin warm and dry No muscular weakness    LABORATORY DATA:  EKG: 08/02/15   NSR with LBBB. 05/25/18 SR rate 75 LBBB LAD no changes   Lab Results  Component Value Date   WBC 6.3 04/04/2022    HGB 11.7 04/04/2022   HCT 36.6 04/04/2022   PLT 322 04/04/2022   GLUCOSE 95 04/04/2022   CHOL 287 (H) 11/07/2021   TRIG 439 (H) 11/07/2021   HDL 38 (L) 11/07/2021   LDLCALC 164 (H) 11/07/2021   ALT 15 04/04/2022   AST 14 04/04/2022   NA 141 04/04/2022   K 4.1 04/04/2022   CL 105 04/04/2022   CREATININE 1.01 (H) 04/04/2022   BUN 22 04/04/2022   CO2 19 (L) 04/04/2022   TSH 1.210 11/07/2021   INR 1.0 06/01/2021   HGBA1C 5.0 07/05/2021    BNP (last 3 results) No results for  input(s): "BNP" in the last 8760 hours.  ProBNP (last 3 results) No results for input(s): "PROBNP" in the last 8760 hours.   Other Studies Reviewed Today:  Echo Study Conclusions from 07/05/21  EF 70-75% no AS or significant valve dx  Assessment/Plan: 1. 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 HTN Well controlled.  Continue current medications and low sodium Dash type diet.    3. HLD on Praluent labs with primary   4. Chronic LBBB stable no high grade AV block   5. Murmur:  TTE reviewed 05/25/19 AV sclerosis mean gradient 10 mmHg  no stenosis EF normal 60-65% Recent TTE does not mention any AS or gradients 07/05/21   6. Chest Pain:  no acute ECG changes but chronic LBBB with radiation to back will check mediastinum with CXR and order lexiscan myovue to r/o CAD Troponon negative 07/09/22 and labs otherwise ok   Lexiscan Myovue CXR  Carotid Duplex   F/U in a year     [employer]

## 2022-07-15 NOTE — Telephone Encounter (Signed)
-----   Message from Wendall Stade, MD sent at 07/14/2022 12:26 PM EDT ----- Can schedule with DOD in office this week will likely need stress testing  ----- Message ----- From: [name], LPN Sent: 1/61/0960  11:16 AM EDT To: Wendall Stade, MD  Please schedule patient

## 2022-07-15 NOTE — Telephone Encounter (Signed)
Left message for patient to call back  

## 2022-07-22 ENCOUNTER — Telehealth: Payer: Self-pay

## 2022-07-22 NOTE — Progress Notes (Signed)
Care Management & Coordination Services Pharmacy Team  Reason for Encounter: Hypertension  Contacted patient to discuss hypertension disease state. Spoke with family on 07/25/2022     Current antihypertensive regimen:  Amlodipine 10 mg daily Valsartan 80 mg Take 2 tablets (160 mg total) by mouth daily. If systolic BP greater than 140 mmHG give 160 mg by mouth daily, if systolic BP between 120-140 mmHG give 80 mg by mouth daily   Patient verbally confirms she is taking the above medications as directed. Yes  How often are you checking your Blood Pressure? Patient's daughter stated nurse checks 3 times weekly when she comes in.  she checks her blood pressure in the morning after taking her medication.  Current home BP readings: None Wrist or arm cuff: cuff Caffeine intake: 1 cup of coffee and 2 cans of mountain dew daily Salt intake: Limited OTC medications including pseudoephedrine or NSAIDs? None  Any readings above 180/100? Patient daughter is unsure.   What recent interventions/DTPs have been made by any provider to improve Blood Pressure control since last CPP Visit: None  Any recent hospitalizations or ED visits since last visit with CPP? No  What diet changes have been made to improve Blood Pressure Control?  Patient's daughter stated patient limits salt intake and   What exercise is being done to improve your Blood Pressure Control?  Patient's daughter states she walks around the house with walker but   Adherence Review: Is the patient currently on ACE/ARB medication? Yes Does the patient have >5 day gap between last estimated fill dates? Yes  Star Rating Drugs: Valsartan 80 mg- Last filled 07-02-2022 30 DS. Previous 06-03-2022 30 DS.  Chart Updates: Recent office visits:  07-09-2022 Marianne Sofia, PA-C. Referral placed to cardiology.  Recent consult visits:  06-26-2022 Gevena Barre, MD ([facility]). Follow up visit for pain in right knee. Cortizone injection  given.  Hospital visits:  None in previous 6 months  Medications: Outpatient Encounter Medications as of 07/22/2022  Medication Sig   Acetaminophen (TYLENOL) 325 MG CAPS Take 650 mg by mouth every 6 (six) hours as needed (pain).   Alirocumab (PRALUENT) 75 MG/ML SOAJ Inject 1 pen  into the skin every 14 (fourteen) days.   amLODipine (NORVASC) 10 MG tablet Take 1 tablet (10 mg total) by mouth daily.   Cholecalciferol (D3-50) 1.25 MG (50000 UT) capsule TAKE 1 CAPSULE BY MOUTH ONE TIME PER WEEK Strength: 1.25 MG (50000 UT)   famotidine (PEPCID) 20 MG tablet Take 1 tablet (20 mg total) by mouth 2 (two) times daily.   furosemide (LASIX) 20 MG tablet Take 1 tablet (20 mg total) by mouth daily as needed for edema.   latanoprost (XALATAN) 0.005 % ophthalmic solution Place 1 drop into both eyes at bedtime.   loratadine (CLARITIN) 10 MG tablet Take 10 mg by mouth daily as needed for allergies.   OVER THE COUNTER MEDICATION Apply 1 application. topically daily as needed (knee pain). Hempvana pain relief cream   ticagrelor (BRILINTA) 90 MG TABS tablet TAKE ONE TABLET BY MOUTH EVERY MORNING and TAKE ONE TABLET BY MOUTH EVERYDAY AT BEDTIME   valsartan (DIOVAN) 80 MG tablet Take 2 tablets (160 mg total) by mouth daily. If systolic BP greater than 140 mmHG give 160 mg by mouth daily, if systolic BP between 120-140 mmHG give 80 mg by mouth daily   vitamin B-12 (CYANOCOBALAMIN) 1000 MCG tablet Take 1,000 mcg by mouth every evening.   vortioxetine HBr (TRINTELLIX) 10 MG TABS tablet  TAKE ONE TABLET BY MOUTH EVERYDAY AT BEDTIME   No facility-administered encounter medications on file as of 07/22/2022.    Recent Office Vitals: BP Readings from Last 3 Encounters:  07/09/22 (!) 148/72  04/04/22 130/70  03/11/22 (!) 161/81   Pulse Readings from Last 3 Encounters:  07/09/22 69  04/04/22 72  03/11/22 73    Wt Readings from Last 3 Encounters:  07/09/22 238 lb (108 kg)  04/04/22 157 lb (71.2 kg)  01/14/22 152  lb (68.9 kg)     Kidney Function Lab Results  Component Value Date/Time   CREATININE 1.01 (H) 04/04/2022 12:30 PM   CREATININE 1.13 (H) 01/14/2022 05:49 PM   GFRNONAA 49 (L) 01/14/2022 05:49 PM   GFRAA 59 (L) 05/15/2020 10:31 AM       Latest Ref Rng & Units 04/04/2022   12:30 PM 01/14/2022    5:49 PM 11/07/2021    2:55 PM  BMP  Glucose 70 - 99 mg/dL 95  308  657   BUN 8 - 27 mg/dL 22  27  28    Creatinine 0.57 - 1.00 mg/dL 8.46  9.62  9.52   BUN/Creat Ratio 12 - 28 22   24    Sodium 134 - 144 mmol/L 141  138  141   Potassium 3.5 - 5.2 mmol/L 4.1  3.9  4.1   Chloride 96 - 106 mmol/L 105  106  104   CO2 20 - 29 mmol/L 19  21  20    Calcium 8.7 - 10.3 mg/dL 84.1  32.4  40.1    Care Management & Coordination Services Pharmacy Team  Reason for Encounter: Medication coordination and delivery  Contacted patient to discuss medications and coordinate delivery from [facility]. Spoke with family on 07/25/2022  Cycle dispensing form sent to Billee Cashing for review.   Last adherence delivery date: 07-04-2022   Patient is due for next adherence delivery on: 08-05-2022  This delivery to include: Adherence Packaging  30 Days  Valsartan 80mg  1-2 tabs daily as directed according to systolic BP (Bottles) Trintellix 10mg  1 BT Amlodipine 10mg  1 B Vitamin D3 50,000units 1 BT on Tuesdays Brilinta 90mg  1 at B and 1 at BT Famotidine 20mg  1 at B and 1 BT  Patient declined the following medications this month: Furosemide- Plenty supply Praluent 75mg - Gets at [facility] Tylenol- OTC Claritn- OTC Latanoprost- Plenty supply B12- OTC  No refill request needed.  Confirmed delivery date of 08-05-2022, advised patient that pharmacy will contact them the morning of delivery.   Any concerns about your medications? No  How often do you forget or accidentally miss a dose? Never  Do you use a pillbox? No  Is patient in packaging Yes   Any concerns or issues with your packaging?  No   Recent blood pressure readings are as follows: None at the moment  07-25-2022: Patient's daughter didn't have any blood pressure readings because she has been in the hospital for her own health. Informed patient that it's no bother because she has to make sure she is healthy to continue taking care of her mother.    Lab Results  Component Value Date/Time   HGBA1C 5.0 07/05/2021 04:37 AM   HGBA1C 5.6 08/01/2015 02:43 AM    [facility] CMA Clinical Pharmacist Assistant [phone]

## 2022-07-23 ENCOUNTER — Ambulatory Visit: Payer: Medicare HMO | Admitting: Cardiovascular Disease

## 2022-07-24 ENCOUNTER — Encounter: Payer: Self-pay | Admitting: Physician Assistant

## 2022-07-26 NOTE — Telephone Encounter (Signed)
-----   Message from Wendall Stade, MD sent at 07/15/2022  6:26 PM EDT ----- Needs lexiscan myovue for chest pain f/u carotid duplex and CXR see if any of these can be done before office visit 07/23/22

## 2022-07-26 NOTE — Telephone Encounter (Signed)
Patient's daughter canceled appointment because she is sick, and it is noted she will call back to schedule when she is feeling better.  Left message for patient to call back to see if we can order these test and have done in the mean time.

## 2022-07-26 NOTE — Telephone Encounter (Signed)
Pt called back per message received in [facility] Triage.    Pt did not answer the telephone;  Unable to leave voicemail message.  Follow up- call back will be required on Monday, 07/29/2022.

## 2022-07-26 NOTE — Telephone Encounter (Signed)
Follow Up:     Patient's daughter is returning Pam's call. She said from 2:30 to 4:00, she will have to pick her grandchild from school.

## 2022-07-29 NOTE — Telephone Encounter (Signed)
Left message for patient to call back  

## 2022-07-31 ENCOUNTER — Telehealth: Payer: Self-pay | Admitting: Cardiovascular Disease

## 2022-07-31 NOTE — Telephone Encounter (Signed)
Patient's daughter [name] is returning phone call. Patient's daughter stated that she is about to leave her mother's home. Patient's daughter is requesting that we call her around 10 AM tomorrow. Please advise.

## 2022-07-31 NOTE — Telephone Encounter (Signed)
Spoke to the pt daughter [name] ( on [place]), pt mentioned to her daughter that she refuses to complete a stress test. 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 [name] thinks her mother has hiatal hernia and ingestion because they both have similar symptoms. Pt daughter has not asked PCP for GI consult. Pt is currently asymptomatic, scheduled OV on 8/5 with Dr. Eden Emms. Will forward to MD and nurse for advise.

## 2022-08-16 ENCOUNTER — Ambulatory Visit: Payer: Medicare HMO | Admitting: Physician Assistant

## 2022-08-23 ENCOUNTER — Telehealth: Payer: Self-pay

## 2022-08-23 NOTE — Progress Notes (Unsigned)
Care Management & Coordination Services Pharmacy Team  Reason for Encounter: Medication coordination and delivery  Contacted patient to discuss medications and coordinate delivery from [facility]. Unsuccessful outreach. Left voicemail for patient to return call. Cycle dispensing form sent to *** for review.   Last adherence delivery date:08/05/2022      Patient is due for next adherence delivery on: 09/03/2022  This delivery to include: Adherence Packaging  30 Days  Valsartan 80mg  1-2 tabs daily as directed according to systolic BP (Bottles) Trintellix 10mg  1 BT Amlodipine 10mg  1 B Vitamin D3 50,000units 1 BT on Tuesdays Brilinta 90mg  1 at B and 1 at BT Famotidine 20mg  1 at B and 1 BT  Patient declined the following medications this month: Praluent 75mg - [facility] Tylenol- OTC Claritn- OTC B12- OTC  No refill request needed.  {Delivery ZOXW:96045}   Any concerns about your medications? {yes/no:20286}  How often do you forget or accidentally miss a dose? {Missed doses:25554}  Do you use a pillbox? {yes/no:20286}  Is patient in packaging {yes/no:20286}  If yes  What is the date on your next pill pack?  Any concerns or issues with your packaging?   Recent blood pressure readings are as follows:***  Recent blood glucose readings are as follows:***   Chart review: Recent office visits:  None  Recent consult visits:  None  Hospital visits:  None in previous 6 months  Medications: Outpatient Encounter Medications as of 08/23/2022  Medication Sig   Acetaminophen (TYLENOL) 325 MG CAPS Take 650 mg by mouth every 6 (six) hours as needed (pain).   Alirocumab (PRALUENT) 75 MG/ML SOAJ Inject 1 pen  into the skin every 14 (fourteen) days.   amLODipine (NORVASC) 10 MG tablet Take 1 tablet (10 mg total) by mouth daily.   Cholecalciferol (D3-50) 1.25 MG (50000 UT) capsule TAKE 1 CAPSULE BY MOUTH ONE TIME PER WEEK Strength: 1.25 MG (50000 UT)   famotidine (PEPCID)  20 MG tablet Take 1 tablet (20 mg total) by mouth 2 (two) times daily.   furosemide (LASIX) 20 MG tablet Take 1 tablet (20 mg total) by mouth daily as needed for edema.   latanoprost (XALATAN) 0.005 % ophthalmic solution Place 1 drop into both eyes at bedtime.   loratadine (CLARITIN) 10 MG tablet Take 10 mg by mouth daily as needed for allergies.   OVER THE COUNTER MEDICATION Apply 1 application. topically daily as needed (knee pain). Hempvana pain relief cream   ticagrelor (BRILINTA) 90 MG TABS tablet TAKE ONE TABLET BY MOUTH EVERY MORNING and TAKE ONE TABLET BY MOUTH EVERYDAY AT BEDTIME   valsartan (DIOVAN) 80 MG tablet Take 2 tablets (160 mg total) by mouth daily. If systolic BP greater than 140 mmHG give 160 mg by mouth daily, if systolic BP between 120-140 mmHG give 80 mg by mouth daily   vitamin B-12 (CYANOCOBALAMIN) 1000 MCG tablet Take 1,000 mcg by mouth every evening.   vortioxetine HBr (TRINTELLIX) 10 MG TABS tablet TAKE ONE TABLET BY MOUTH EVERYDAY AT BEDTIME   No facility-administered encounter medications on file as of 08/23/2022.   BP Readings from Last 3 Encounters:  07/09/22 (!) 148/72  04/04/22 130/70  03/11/22 (!) 161/81    Pulse Readings from Last 3 Encounters:  07/09/22 69  04/04/22 72  03/11/22 73    Lab Results  Component Value Date/Time   HGBA1C 5.0 07/05/2021 04:37 AM   HGBA1C 5.6 08/01/2015 02:43 AM   Lab Results  Component Value Date   CREATININE  1.01 (H) 04/04/2022   BUN 22 04/04/2022   GFRNONAA 49 (L) 01/14/2022   GFRAA 59 (L) 05/15/2020   NA 141 04/04/2022   K 4.1 04/04/2022   CALCIUM 10.0 04/04/2022   CO2 19 (L) 04/04/2022    Billee Cashing, CMA Clinical Pharmacist Assistant [phone]

## 2022-08-24 ENCOUNTER — Emergency Department (HOSPITAL_COMMUNITY): Payer: Medicare HMO

## 2022-08-24 ENCOUNTER — Other Ambulatory Visit: Payer: Self-pay

## 2022-08-24 ENCOUNTER — Emergency Department (HOSPITAL_COMMUNITY)
Admission: EM | Admit: 2022-08-24 | Discharge: 2022-08-24 | Disposition: A | Discharge status: Home / Self Care | Payer: Medicare HMO | Attending: Emergency Medicine | Admitting: Emergency Medicine

## 2022-08-24 ENCOUNTER — Encounter (HOSPITAL_COMMUNITY): Payer: Self-pay | Admitting: *Deleted

## 2022-08-24 DIAGNOSIS — I700 Atherosclerosis of aorta: Secondary | ICD-10-CM | POA: Insufficient documentation

## 2022-08-24 DIAGNOSIS — R918 Other nonspecific abnormal finding of lung field: Secondary | ICD-10-CM | POA: Diagnosis not present

## 2022-08-24 DIAGNOSIS — R911 Solitary pulmonary nodule: Secondary | ICD-10-CM | POA: Diagnosis not present

## 2022-08-24 DIAGNOSIS — S0990XA Unspecified injury of head, initial encounter: Secondary | ICD-10-CM | POA: Diagnosis not present

## 2022-08-24 DIAGNOSIS — R0902 Hypoxemia: Secondary | ICD-10-CM | POA: Diagnosis not present

## 2022-08-24 DIAGNOSIS — K753 Granulomatous hepatitis, not elsewhere classified: Secondary | ICD-10-CM | POA: Diagnosis not present

## 2022-08-24 DIAGNOSIS — K449 Diaphragmatic hernia without obstruction or gangrene: Secondary | ICD-10-CM | POA: Insufficient documentation

## 2022-08-24 DIAGNOSIS — Z043 Encounter for examination and observation following other accident: Secondary | ICD-10-CM | POA: Diagnosis not present

## 2022-08-24 DIAGNOSIS — M4854XA Collapsed vertebra, not elsewhere classified, thoracic region, initial encounter for fracture: Secondary | ICD-10-CM | POA: Insufficient documentation

## 2022-08-24 DIAGNOSIS — Z743 Need for continuous supervision: Secondary | ICD-10-CM | POA: Diagnosis not present

## 2022-08-24 DIAGNOSIS — N281 Cyst of kidney, acquired: Secondary | ICD-10-CM | POA: Diagnosis not present

## 2022-08-24 DIAGNOSIS — K5730 Diverticulosis of large intestine without perforation or abscess without bleeding: Secondary | ICD-10-CM | POA: Insufficient documentation

## 2022-08-24 DIAGNOSIS — S80811A Abrasion, right lower leg, initial encounter: Secondary | ICD-10-CM | POA: Diagnosis not present

## 2022-08-24 DIAGNOSIS — I2510 Atherosclerotic heart disease of native coronary artery without angina pectoris: Secondary | ICD-10-CM | POA: Insufficient documentation

## 2022-08-24 DIAGNOSIS — W19XXXA Unspecified fall, initial encounter: Secondary | ICD-10-CM

## 2022-08-24 DIAGNOSIS — W010XXA Fall on same level from slipping, tripping and stumbling without subsequent striking against object, initial encounter: Secondary | ICD-10-CM | POA: Insufficient documentation

## 2022-08-24 DIAGNOSIS — Z23 Encounter for immunization: Secondary | ICD-10-CM | POA: Diagnosis not present

## 2022-08-24 DIAGNOSIS — S0003XA Contusion of scalp, initial encounter: Secondary | ICD-10-CM | POA: Diagnosis not present

## 2022-08-24 DIAGNOSIS — I10 Essential (primary) hypertension: Secondary | ICD-10-CM | POA: Diagnosis not present

## 2022-08-24 DIAGNOSIS — K429 Umbilical hernia without obstruction or gangrene: Secondary | ICD-10-CM | POA: Diagnosis not present

## 2022-08-24 DIAGNOSIS — M542 Cervicalgia: Secondary | ICD-10-CM | POA: Diagnosis not present

## 2022-08-24 LAB — I-STAT CHEM 8, ED
BUN: 40 mg/dL — ABNORMAL HIGH (ref 8–23)
Calcium, Ion: 1.24 mmol/L (ref 1.15–1.40)
Chloride: 107 mmol/L (ref 98–111)
Creatinine, Ser: 1.2 mg/dL — ABNORMAL HIGH (ref 0.44–1.00)
Glucose, Bld: 136 mg/dL — ABNORMAL HIGH (ref 70–99)
HCT: 37 % (ref 36.0–46.0)
Hemoglobin: 12.6 g/dL (ref 12.0–15.0)
Potassium: 4 mmol/L (ref 3.5–5.1)
Sodium: 139 mmol/L (ref 135–145)
TCO2: 23 mmol/L (ref 22–32)

## 2022-08-24 LAB — COMPREHENSIVE METABOLIC PANEL
ALT: 18 U/L (ref 0–44)
AST: 17 U/L (ref 15–41)
Albumin: 3.6 g/dL (ref 3.5–5.0)
Alkaline Phosphatase: 96 U/L (ref 38–126)
Anion gap: 13 (ref 5–15)
BUN: 26 mg/dL — ABNORMAL HIGH (ref 8–23)
CO2: 19 mmol/L — ABNORMAL LOW (ref 22–32)
Calcium: 9.9 mg/dL (ref 8.9–10.3)
Chloride: 105 mmol/L (ref 98–111)
Creatinine, Ser: 1.16 mg/dL — ABNORMAL HIGH (ref 0.44–1.00)
GFR, Estimated: 47 mL/min — ABNORMAL LOW (ref >60)
Glucose, Bld: 134 mg/dL — ABNORMAL HIGH (ref 70–99)
Potassium: 3.3 mmol/L — ABNORMAL LOW (ref 3.5–5.1)
Sodium: 137 mmol/L (ref 135–145)
Total Bilirubin: 0.7 mg/dL (ref 0.3–1.2)
Total Protein: 6.9 g/dL (ref 6.5–8.1)

## 2022-08-24 LAB — I-STAT VENOUS BLOOD GAS, ED
Acid-base deficit: 2 mmol/L (ref 0.0–2.0)
Bicarbonate: 22.1 mmol/L (ref 20.0–28.0)
Calcium, Ion: 1.22 mmol/L (ref 1.15–1.40)
HCT: 36 % (ref 36.0–46.0)
Hemoglobin: 12.2 g/dL (ref 12.0–15.0)
O2 Saturation: 89 %
Potassium: 5.3 mmol/L — ABNORMAL HIGH (ref 3.5–5.1)
Sodium: 137 mmol/L (ref 135–145)
TCO2: 23 mmol/L (ref 22–32)
pCO2, Ven: 33.6 mmHg — ABNORMAL LOW (ref 44–60)
pH, Ven: 7.426 (ref 7.25–7.43)
pO2, Ven: 54 mmHg — ABNORMAL HIGH (ref 32–45)

## 2022-08-24 LAB — PROTIME-INR
INR: 1.1 (ref 0.8–1.2)
Prothrombin Time: 14 seconds (ref 11.4–15.2)

## 2022-08-24 LAB — CBC
HCT: 36.7 % (ref 36.0–46.0)
Hemoglobin: 12 g/dL (ref 12.0–15.0)
MCH: 30.8 pg (ref 26.0–34.0)
MCHC: 32.7 g/dL (ref 30.0–36.0)
MCV: 94.3 fL (ref 80.0–100.0)
Platelets: 299 10*3/uL (ref 150–400)
RBC: 3.89 MIL/uL (ref 3.87–5.11)
RDW: 12.8 % (ref 11.5–15.5)
WBC: 11.5 10*3/uL — ABNORMAL HIGH (ref 4.0–10.5)
nRBC: 0 % (ref 0.0–0.2)

## 2022-08-24 LAB — ETHANOL: Alcohol, Ethyl (B): <10 mg/dL (ref <10)

## 2022-08-24 LAB — CBG MONITORING, ED: Glucose-Capillary: 135 mg/dL — ABNORMAL HIGH (ref 70–99)

## 2022-08-24 MED ORDER — TETANUS-DIPHTH-ACELL PERTUSSIS 5-2.5-18.5 LF-MCG/0.5 IM SUSY
0.5000 mL | PREFILLED_SYRINGE | Freq: Once | INTRAMUSCULAR | Status: AC
  Administered 2022-08-24: 0.5 mL via INTRAMUSCULAR
  Filled 2022-08-24: qty 0.5

## 2022-08-24 MED ORDER — CEFAZOLIN SODIUM-DEXTROSE 2-4 GM/100ML-% IV SOLN
2.0000 g | Freq: Once | INTRAVENOUS | Status: AC
  Administered 2022-08-24: 2 g via INTRAVENOUS
  Filled 2022-08-24: qty 100

## 2022-08-24 MED ORDER — ACETAMINOPHEN 500 MG PO TABS: 1000.0000 mg | ORAL_TABLET | Freq: Once | ORAL | Status: DC

## 2022-08-24 MED ORDER — SODIUM CHLORIDE 0.9 % IV BOLUS
500.0000 mL | Freq: Once | INTRAVENOUS | Status: AC
  Administered 2022-08-24: 500 mL via INTRAVENOUS

## 2022-08-24 MED ORDER — CEFAZOLIN SODIUM-DEXTROSE 2-4 GM/100ML-% IV SOLN: 2.0000 g | Freq: Once | INTRAVENOUS | Status: DC

## 2022-08-24 MED ORDER — TETANUS-DIPHTH-ACELL PERTUSSIS 5-2.5-18.5 LF-MCG/0.5 IM SUSY: 0.5000 mL | PREFILLED_SYRINGE | Freq: Once | INTRAMUSCULAR | Status: DC

## 2022-08-24 MED ORDER — IOHEXOL 350 MG/ML SOLN
75.0000 mL | Freq: Once | INTRAVENOUS | Status: AC | PRN
  Administered 2022-08-24: 75 mL via INTRAVENOUS

## 2022-08-24 MED ORDER — BACITRACIN ZINC 500 UNIT/GM EX OINT: TOPICAL_OINTMENT | Freq: Two times a day (BID) | CUTANEOUS | Status: DC

## 2022-08-24 MED ORDER — KETOROLAC TROMETHAMINE 30 MG/ML IJ SOLN: 15.0000 mg | Freq: Once | INTRAMUSCULAR | Status: DC

## 2022-08-24 MED ORDER — ACETAMINOPHEN 325 MG PO TABS
325.0000 mg | ORAL_TABLET | Freq: Once | ORAL | Status: AC
  Administered 2022-08-24: 325 mg via ORAL
  Filled 2022-08-24: qty 1

## 2022-08-24 NOTE — ED Provider Notes (Signed)
[name]   CSN: [id number] Arrival date & time: 08/24/22  0107     History  Chief Complaint  Patient presents with   [name] is a 82 y.o. female.  The history is provided by the patient and the EMS personnel.  Fall This is a new problem. The current episode started less than 1 hour ago. The problem occurs constantly. The problem has been resolved. Pertinent negatives include no chest pain, no abdominal pain, no headaches and no shortness of breath. Nothing aggravates the symptoms. Nothing relieves the symptoms. She has tried nothing for the symptoms. The treatment provided no relief.  Patient with a h/o chronic kidney disease and previous embolic stroke who presents with a mechanical fall at home on blood thinners.      Past Medical History:  Diagnosis Date   Acute embolic stroke (HCC)    Benign paroxysmal vertigo, unspecified ear 06/07/2019   CKD (chronic kidney disease), stage III (HCC)    GERD (gastroesophageal reflux disease)    Glaucoma    Incisional infection 12/25/2020     Home Medications Prior to Admission medications   Medication Sig Start Date End Date Taking? Authorizing Provider  Acetaminophen (TYLENOL) 325 MG CAPS Take 650 mg by mouth every 6 (six) hours as needed (pain).    [provider]  Alirocumab (PRALUENT) 75 MG/ML SOAJ Inject 1 pen  into the skin every 14 (fourteen) days. 05/08/22   Wendall Stade, MD  amLODipine (NORVASC) 10 MG tablet Take 1 tablet (10 mg total) by mouth daily. 05/24/22   Lurline Del, FNP  Cholecalciferol (D3-50) 1.25 MG (50000 UT) capsule TAKE 1 CAPSULE BY MOUTH ONE TIME PER WEEK Strength: 1.25 MG (50000 UT) 04/25/22   Lurline Del, FNP  famotidine (PEPCID) 20 MG tablet Take 1 tablet (20 mg total) by mouth 2 (two) times daily. 04/04/22   Lurline Del, FNP  furosemide (LASIX) 20 MG tablet Take 1 tablet (20 mg total) by mouth daily as needed for edema.  05/24/22   Lurline Del, FNP  latanoprost (XALATAN) 0.005 % ophthalmic solution Place 1 drop into both eyes at bedtime. 12/09/18   [provider]  loratadine (CLARITIN) 10 MG tablet Take 10 mg by mouth daily as needed for allergies.    [provider]  OVER THE COUNTER MEDICATION Apply 1 application. topically daily as needed (knee pain). Hempvana pain relief cream    [provider]  ticagrelor (BRILINTA) 90 MG TABS tablet TAKE ONE TABLET BY MOUTH EVERY MORNING and TAKE ONE TABLET BY MOUTH EVERYDAY AT BEDTIME 07/11/22   Jaffe, Adam R, DO  valsartan (DIOVAN) 80 MG tablet Take 2 tablets (160 mg total) by mouth daily. If systolic BP greater than 140 mmHG give 160 mg by mouth daily, if systolic BP between 120-140 mmHG give 80 mg by mouth daily 07/02/22   Cox, Kirsten, MD  vitamin B-12 (CYANOCOBALAMIN) 1000 MCG tablet Take 1,000 mcg by mouth every evening.    [provider]  vortioxetine HBr (TRINTELLIX) 10 MG TABS tablet TAKE ONE TABLET BY MOUTH EVERYDAY AT BEDTIME 04/04/22   Lurline Del, FNP      Allergies    Codeine, Gabapentin, Meclizine, Requip [ropinirole hcl], and Statins    Review of Systems   Review of Systems  Constitutional:  Negative for fever.  HENT:  Negative for facial swelling.   Respiratory:  Negative for shortness of breath.  Cardiovascular:  Negative for chest pain.  Gastrointestinal:  Negative for abdominal pain.  Musculoskeletal:  Negative for back pain and neck pain.  Neurological:  Negative for dizziness, facial asymmetry, speech difficulty, weakness, numbness and headaches.  Psychiatric/Behavioral:  Negative for confusion.   All other systems reviewed and are negative.   Physical Exam Updated Vital Signs BP (!) 160/69   Pulse 84   Temp 98.8 F (37.1 C)   Resp 19   Ht 5' (1.524 m)   Wt 78.4 kg   SpO2 99%   BMI 33.76 kg/m  Physical Exam Vitals and nursing Yates reviewed. Exam conducted with a chaperone present.  Constitutional:       General: She is not in acute distress.    Appearance: Normal appearance. She is well-developed.  HENT:     Head: Normocephalic and atraumatic.     Right Ear: Tympanic membrane normal.     Left Ear: Tympanic membrane normal.     Nose: Nose normal.     Mouth/Throat:     Mouth: Mucous membranes are moist.     Pharynx: Oropharynx is clear.  Eyes:     Extraocular Movements: Extraocular movements intact.     Pupils: Pupils are equal, round, and reactive to light.  Cardiovascular:     Rate and Rhythm: Normal rate and regular rhythm.     Pulses: Normal pulses.     Heart sounds: Normal heart sounds.  Pulmonary:     Effort: Pulmonary effort is normal. No respiratory distress.     Breath sounds: Normal breath sounds.  Abdominal:     General: Bowel sounds are normal. There is no distension.     Palpations: Abdomen is soft.     Tenderness: There is no abdominal tenderness. There is no guarding or rebound.  Genitourinary:    Vagina: No vaginal discharge.  Musculoskeletal:        General: No tenderness. Normal range of motion.     Right upper arm: Normal.     Left upper arm: Normal.     Right elbow: Normal.     Left elbow: Normal.     Right forearm: Normal.     Left forearm: Normal.     Right wrist: No snuff box tenderness or crepitus.     Left wrist: No snuff box tenderness or crepitus.     Right hand: Normal. No bony tenderness. Normal strength.     Left hand: Normal. No bony tenderness. Normal strength.     Cervical back: Normal range of motion and neck supple. No rigidity or tenderness.     Thoracic back: Normal. No swelling, deformity, spasms or tenderness.     Lumbar back: Normal. No swelling, deformity, spasms or tenderness.     Right lower leg: No edema.     Left lower leg: No edema.       Legs:  Lymphadenopathy:     Cervical: No cervical adenopathy.  Skin:    General: Skin is warm and dry.     Capillary Refill: Capillary refill takes less than 2 seconds.      Findings: No erythema or rash.  Neurological:     General: No focal deficit present.     Mental Status: She is alert.     Deep Tendon Reflexes: Reflexes normal.  Psychiatric:        Mood and Affect: Mood normal.     ED Results / Procedures / Treatments   Labs (all labs ordered are listed, but only  abnormal results are displayed) Results for orders placed or performed during the hospital encounter of 08/24/22  Comprehensive metabolic panel  Result Value Ref Range   Sodium 137 135 - 145 mmol/L   Potassium 3.3 (L) 3.5 - 5.1 mmol/L   Chloride 105 98 - 111 mmol/L   CO2 19 (L) 22 - 32 mmol/L   Glucose, Bld 134 (H) 70 - 99 mg/dL   BUN 26 (H) 8 - 23 mg/dL   Creatinine, Ser 0.45 (H) 0.44 - 1.00 mg/dL   Calcium 9.9 8.9 - 40.9 mg/dL   Total Protein 6.9 6.5 - 8.1 g/dL   Albumin 3.6 3.5 - 5.0 g/dL   AST 17 15 - 41 U/L   ALT 18 0 - 44 U/L   Alkaline Phosphatase 96 38 - 126 U/L   Total Bilirubin 0.7 0.3 - 1.2 mg/dL   GFR, Estimated 47 (L) >60 mL/min   Anion gap 13 5 - 15  CBC  Result Value Ref Range   WBC 11.5 (H) 4.0 - 10.5 K/uL   RBC 3.89 3.87 - 5.11 MIL/uL   Hemoglobin 12.0 12.0 - 15.0 g/dL   HCT 81.1 91.4 - 78.2 %   MCV 94.3 80.0 - 100.0 fL   MCH 30.8 26.0 - 34.0 pg   MCHC 32.7 30.0 - 36.0 g/dL   RDW 95.6 21.3 - 08.6 %   Platelets 299 150 - 400 K/uL   nRBC 0.0 0.0 - 0.2 %  Ethanol  Result Value Ref Range   Alcohol, Ethyl (B) <10 <10 mg/dL  Protime-INR  Result Value Ref Range   Prothrombin Time 14.0 11.4 - 15.2 seconds   INR 1.1 0.8 - 1.2  I-Stat Chem 8, ED  Result Value Ref Range   Sodium 139 135 - 145 mmol/L   Potassium 4.0 3.5 - 5.1 mmol/L   Chloride 107 98 - 111 mmol/L   BUN 40 (H) 8 - 23 mg/dL   Creatinine, Ser 5.78 (H) 0.44 - 1.00 mg/dL   Glucose, Bld 469 (H) 70 - 99 mg/dL   Calcium, Ion 6.29 5.28 - 1.40 mmol/L   TCO2 23 22 - 32 mmol/L   Hemoglobin 12.6 12.0 - 15.0 g/dL   HCT 41.3 24.4 - 01.0 %  CBG monitoring, ED  Result Value Ref Range   Glucose-Capillary  135 (H) 70 - 99 mg/dL  I-Stat venous blood gas, ED  Result Value Ref Range   pH, Ven 7.426 7.25 - 7.43   pCO2, Ven 33.6 (L) 44 - 60 mmHg   pO2, Ven 54 (H) 32 - 45 mmHg   Bicarbonate 22.1 20.0 - 28.0 mmol/L   TCO2 23 22 - 32 mmol/L   O2 Saturation 89 %   Acid-base deficit 2.0 0.0 - 2.0 mmol/L   Sodium 137 135 - 145 mmol/L   Potassium 5.3 (H) 3.5 - 5.1 mmol/L   Calcium, Ion 1.22 1.15 - 1.40 mmol/L   HCT 36.0 36.0 - 46.0 %   Hemoglobin 12.2 12.0 - 15.0 g/dL   Sample type VENOUS    CT CHEST ABDOMEN PELVIS W CONTRAST  Result Date: 08/24/2022 CLINICAL DATA:  Trauma, fall. EXAM: CT CHEST, ABDOMEN, AND PELVIS WITH CONTRAST TECHNIQUE: Multidetector CT imaging of the chest, abdomen and pelvis was performed following the standard protocol during bolus administration of intravenous contrast. RADIATION DOSE REDUCTION: This exam was performed according to the departmental dose-optimization program which includes automated exposure control, adjustment of the mA and/or kV according to patient size and/or use of  iterative reconstruction technique. CONTRAST:  75mL OMNIPAQUE IOHEXOL 350 MG/ML SOLN COMPARISON:  Pelvis and chest radiographs earlier today. FINDINGS: CT CHEST FINDINGS Cardiovascular: No evidence of acute aortic or vascular injury. Aortic atherosclerosis. There are coronary artery calcifications. Upper normal heart size. No pericardial effusion. Mediastinum/Nodes: No mediastinal hemorrhage. Large hiatal hernia, the majority of the stomach is intrathoracic. No pneumomediastinum. No adenopathy. Small amount of fluid in the superior pericardial recess. Lungs/Pleura: No pneumothorax. No pulmonary contusion. Subpleural reticulation and ground-glass without apical basilar gradient. No pleural effusion. Subpleural 4 mm right upper lobe nodule series 5, image 29. This is stable dating back to 09/28/2012 exam and considered benign. No further imaging follow-up is needed. Musculoskeletal: No acute fracture of the  ribs, sternum, included clavicles or shoulder girdles. There are remote fractures of right anterior ribs at the costosternal junction that have callus formation. Mild T1 and T2 superior endplate compression deformities are stable from cervical spine CT 01/14/2022, chronic. No acute thoracic spine fracture. There is no confluent chest wall contusion. CT ABDOMEN PELVIS FINDINGS Hepatobiliary: No hepatic injury or perihepatic hematoma. Punctate hepatic granuloma. Gallbladder is unremarkable. Pancreas: No injury.  No ductal dilatation or inflammation. Spleen: No splenic injury or perisplenic hematoma. Adrenals/Urinary Tract: No adrenal hemorrhage or renal injury identified. There is cortical scarring throughout the left kidney. Left kidney is ptotic with dilatation and tortuosity of the renal pelvis and ureter. The ureter is dilated to the bladder insertion. No cause for obstruction. Parapelvic cysts in the right kidney, needing no further imaging follow-up. Bladder is unremarkable. Stomach/Bowel: No bowel injury or mesenteric hematoma. Majority of the stomach is intrathoracic with large hiatal hernia. No bowel wall thickening or inflammation. Colonic diverticulosis without diverticulitis. The appendix is normal. Vascular/Lymphatic: Advanced aortic atherosclerosis. No aortic aneurysm. No acute vascular findings. No abdominopelvic adenopathy. Reproductive: Uterus appears absent. No adnexal mass. Other: No free air or free fluid. Diminutive fat containing umbilical hernia. Musculoskeletal: No acute fracture of the lumbar spine or pelvis. Mild scoliosis and lumbar degenerative change. Left hip arthroplasty. No confluent abdominal wall contusion. IMPRESSION: 1. No acute traumatic injury to the chest, abdomen, or pelvis. 2. Remote right anterior rib fractures at the costosternal junction. 3. Large hiatal hernia, the majority of the stomach is intrathoracic. 4. Colonic diverticulosis without diverticulitis. 5. Left renal  cortical scarring. Left renal pelvis and ureter dilatation to the bladder insertion without cause for obstruction. 6. Chronic mild T1 and T2 superior endplate compression deformities. Aortic Atherosclerosis (ICD10-I70.0). Electronically Signed   By: Narda Rutherford M.D.   On: 08/24/2022 01:59   CT CERVICAL SPINE WO CONTRAST  Result Date: 08/24/2022 CLINICAL DATA:  Trauma, fall. EXAM: CT CERVICAL SPINE WITHOUT CONTRAST TECHNIQUE: Multidetector CT imaging of the cervical spine was performed without intravenous contrast. Multiplanar CT image reconstructions were also generated. RADIATION DOSE REDUCTION: This exam was performed according to the departmental dose-optimization program which includes automated exposure control, adjustment of the mA and/or kV according to patient size and/or use of iterative reconstruction technique. COMPARISON:  CT cervical spine 01/14/2022 FINDINGS: Alignment: Chronic grade 1 anterolisthesis of C7 on T1. No traumatic subluxation. Skull base and vertebrae: No acute cervical spine fracture. The dens and skull base are intact. Mild chronic T1 compression deformity, stable. Soft tissues and spinal canal: No prevertebral fluid or swelling. No visible canal hematoma. Disc levels: Degenerative disc disease, most prominent at C5-C6. Similar to prior. Mild multilevel facet hypertrophy. Upper chest: Assessed on concurrent chest CT, reported separately. Other: Carotid calcifications. IMPRESSION: Degenerative  change in the cervical spine without acute fracture or subluxation. Electronically Signed   By: Narda Rutherford M.D.   On: 08/24/2022 01:49   CT HEAD WO CONTRAST  Result Date: 08/24/2022 CLINICAL DATA:  Head trauma, fall. EXAM: CT HEAD WITHOUT CONTRAST TECHNIQUE: Contiguous axial images were obtained from the base of the skull through the vertex without intravenous contrast. RADIATION DOSE REDUCTION: This exam was performed according to the departmental dose-optimization program which  includes automated exposure control, adjustment of the mA and/or kV according to patient size and/or use of iterative reconstruction technique. COMPARISON:  Head CT 01/14/2022 FINDINGS: Brain: No intracranial hemorrhage, mass effect, or midline shift. Stable degree of generalized atrophy. No hydrocephalus. The basilar cisterns are patent. Moderate periventricular and deep chronic small vessel ischemic change. Remote left cerebellar and left occipital infarcts. Remote lacunar infarcts in the left caudate and basal ganglia. No evidence of territorial infarct or acute ischemia. No extra-axial or intracranial fluid collection. Vascular: Atherosclerosis of skullbase vasculature without hyperdense vessel or abnormal calcification. Skull: No fracture or focal lesion. Sinuses/Orbits: No acute finding. Other: Posterior vertex scalp hematoma. IMPRESSION: 1. Posterior vertex scalp hematoma. No acute intracranial abnormality. No skull fracture. 2. Unchanged atrophy, chronic small vessel ischemic change, and remote infarcts. Electronically Signed   By: Narda Rutherford M.D.   On: 08/24/2022 01:47   DG Chest Port 1 View  Result Date: 08/24/2022 CLINICAL DATA:  Trauma, fall. EXAM: PORTABLE CHEST 1 VIEW COMPARISON:  Radiograph 06/10/2015. FINDINGS: Low lung volumes limit assessment. Heart size grossly normal for technique. There is a large retrocardiac hiatal hernia. No pneumothorax or large pleural effusion. The bones are under mineralized, no obvious acute osseous abnormality. IMPRESSION: 1. Low lung volumes without evidence of acute traumatic injury. 2. Large hiatal hernia. Electronically Signed   By: Narda Rutherford M.D.   On: 08/24/2022 01:29   DG Pelvis Portable  Result Date: 08/24/2022 CLINICAL DATA:  Trauma, fall. EXAM: PORTABLE PELVIS 1-2 VIEWS COMPARISON:  None Available. FINDINGS: No evidence of pelvic fracture. Moderate 2 shin of the right hip limits proximal femur assessment. No displaced proximal femur  fracture. Left hip arthroplasty is intact were visualized. Pubic symphysis and sacroiliac joints are congruent. IMPRESSION: No evidence of pelvic fracture. Right hip partially obscured by rotation. Left hip arthroplasty is intact. Electronically Signed   By: Narda Rutherford M.D.   On: 08/24/2022 01:27    Radiology CT CHEST ABDOMEN PELVIS W CONTRAST  Result Date: 08/24/2022 CLINICAL DATA:  Trauma, fall. EXAM: CT CHEST, ABDOMEN, AND PELVIS WITH CONTRAST TECHNIQUE: Multidetector CT imaging of the chest, abdomen and pelvis was performed following the standard protocol during bolus administration of intravenous contrast. RADIATION DOSE REDUCTION: This exam was performed according to the departmental dose-optimization program which includes automated exposure control, adjustment of the mA and/or kV according to patient size and/or use of iterative reconstruction technique. CONTRAST:  75mL OMNIPAQUE IOHEXOL 350 MG/ML SOLN COMPARISON:  Pelvis and chest radiographs earlier today. FINDINGS: CT CHEST FINDINGS Cardiovascular: No evidence of acute aortic or vascular injury. Aortic atherosclerosis. There are coronary artery calcifications. Upper normal heart size. No pericardial effusion. Mediastinum/Nodes: No mediastinal hemorrhage. Large hiatal hernia, the majority of the stomach is intrathoracic. No pneumomediastinum. No adenopathy. Small amount of fluid in the superior pericardial recess. Lungs/Pleura: No pneumothorax. No pulmonary contusion. Subpleural reticulation and ground-glass without apical basilar gradient. No pleural effusion. Subpleural 4 mm right upper lobe nodule series 5, image 29. This is stable dating back to 09/28/2012 exam and considered  benign. No further imaging follow-up is needed. Musculoskeletal: No acute fracture of the ribs, sternum, included clavicles or shoulder girdles. There are remote fractures of right anterior ribs at the costosternal junction that have callus formation. Mild T1 and T2  superior endplate compression deformities are stable from cervical spine CT 01/14/2022, chronic. No acute thoracic spine fracture. There is no confluent chest wall contusion. CT ABDOMEN PELVIS FINDINGS Hepatobiliary: No hepatic injury or perihepatic hematoma. Punctate hepatic granuloma. Gallbladder is unremarkable. Pancreas: No injury.  No ductal dilatation or inflammation. Spleen: No splenic injury or perisplenic hematoma. Adrenals/Urinary Tract: No adrenal hemorrhage or renal injury identified. There is cortical scarring throughout the left kidney. Left kidney is ptotic with dilatation and tortuosity of the renal pelvis and ureter. The ureter is dilated to the bladder insertion. No cause for obstruction. Parapelvic cysts in the right kidney, needing no further imaging follow-up. Bladder is unremarkable. Stomach/Bowel: No bowel injury or mesenteric hematoma. Majority of the stomach is intrathoracic with large hiatal hernia. No bowel wall thickening or inflammation. Colonic diverticulosis without diverticulitis. The appendix is normal. Vascular/Lymphatic: Advanced aortic atherosclerosis. No aortic aneurysm. No acute vascular findings. No abdominopelvic adenopathy. Reproductive: Uterus appears absent. No adnexal mass. Other: No free air or free fluid. Diminutive fat containing umbilical hernia. Musculoskeletal: No acute fracture of the lumbar spine or pelvis. Mild scoliosis and lumbar degenerative change. Left hip arthroplasty. No confluent abdominal wall contusion. IMPRESSION: 1. No acute traumatic injury to the chest, abdomen, or pelvis. 2. Remote right anterior rib fractures at the costosternal junction. 3. Large hiatal hernia, the majority of the stomach is intrathoracic. 4. Colonic diverticulosis without diverticulitis. 5. Left renal cortical scarring. Left renal pelvis and ureter dilatation to the bladder insertion without cause for obstruction. 6. Chronic mild T1 and T2 superior endplate compression  deformities. Aortic Atherosclerosis (ICD10-I70.0). Electronically Signed   By: Narda Rutherford M.D.   On: 08/24/2022 01:59   CT CERVICAL SPINE WO CONTRAST  Result Date: 08/24/2022 CLINICAL DATA:  Trauma, fall. EXAM: CT CERVICAL SPINE WITHOUT CONTRAST TECHNIQUE: Multidetector CT imaging of the cervical spine was performed without intravenous contrast. Multiplanar CT image reconstructions were also generated. RADIATION DOSE REDUCTION: This exam was performed according to the departmental dose-optimization program which includes automated exposure control, adjustment of the mA and/or kV according to patient size and/or use of iterative reconstruction technique. COMPARISON:  CT cervical spine 01/14/2022 FINDINGS: Alignment: Chronic grade 1 anterolisthesis of C7 on T1. No traumatic subluxation. Skull base and vertebrae: No acute cervical spine fracture. The dens and skull base are intact. Mild chronic T1 compression deformity, stable. Soft tissues and spinal canal: No prevertebral fluid or swelling. No visible canal hematoma. Disc levels: Degenerative disc disease, most prominent at C5-C6. Similar to prior. Mild multilevel facet hypertrophy. Upper chest: Assessed on concurrent chest CT, reported separately. Other: Carotid calcifications. IMPRESSION: Degenerative change in the cervical spine without acute fracture or subluxation. Electronically Signed   By: Narda Rutherford M.D.   On: 08/24/2022 01:49   CT HEAD WO CONTRAST  Result Date: 08/24/2022 CLINICAL DATA:  Head trauma, fall. EXAM: CT HEAD WITHOUT CONTRAST TECHNIQUE: Contiguous axial images were obtained from the base of the skull through the vertex without intravenous contrast. RADIATION DOSE REDUCTION: This exam was performed according to the departmental dose-optimization program which includes automated exposure control, adjustment of the mA and/or kV according to patient size and/or use of iterative reconstruction technique. COMPARISON:  Head CT  01/14/2022 FINDINGS: Brain: No intracranial hemorrhage, mass effect, or  midline shift. Stable degree of generalized atrophy. No hydrocephalus. The basilar cisterns are patent. Moderate periventricular and deep chronic small vessel ischemic change. Remote left cerebellar and left occipital infarcts. Remote lacunar infarcts in the left caudate and basal ganglia. No evidence of territorial infarct or acute ischemia. No extra-axial or intracranial fluid collection. Vascular: Atherosclerosis of skullbase vasculature without hyperdense vessel or abnormal calcification. Skull: No fracture or focal lesion. Sinuses/Orbits: No acute finding. Other: Posterior vertex scalp hematoma. IMPRESSION: 1. Posterior vertex scalp hematoma. No acute intracranial abnormality. No skull fracture. 2. Unchanged atrophy, chronic small vessel ischemic change, and remote infarcts. Electronically Signed   By: Narda Rutherford M.D.   On: 08/24/2022 01:47   DG Chest Port 1 View  Result Date: 08/24/2022 CLINICAL DATA:  Trauma, fall. EXAM: PORTABLE CHEST 1 VIEW COMPARISON:  Radiograph 06/10/2015. FINDINGS: Low lung volumes limit assessment. Heart size grossly normal for technique. There is a large retrocardiac hiatal hernia. No pneumothorax or large pleural effusion. The bones are under mineralized, no obvious acute osseous abnormality. IMPRESSION: 1. Low lung volumes without evidence of acute traumatic injury. 2. Large hiatal hernia. Electronically Signed   By: Narda Rutherford M.D.   On: 08/24/2022 01:29   DG Pelvis Portable  Result Date: 08/24/2022 CLINICAL DATA:  Trauma, fall. EXAM: PORTABLE PELVIS 1-2 VIEWS COMPARISON:  None Available. FINDINGS: No evidence of pelvic fracture. Moderate 2 shin of the right hip limits proximal femur assessment. No displaced proximal femur fracture. Left hip arthroplasty is intact were visualized. Pubic symphysis and sacroiliac joints are congruent. IMPRESSION: No evidence of pelvic fracture. Right hip  partially obscured by rotation. Left hip arthroplasty is intact. Electronically Signed   By: Narda Rutherford M.D.   On: 08/24/2022 01:27    Procedures Procedures    Medications Ordered in ED Medications  ceFAZolin (ANCEF) IVPB 2g/100 mL premix (0 g Intravenous Stopped 08/24/22 0150)  Tdap (BOOSTRIX) injection 0.5 mL (0.5 mLs Intramuscular Given 08/24/22 0122)  iohexol (OMNIPAQUE) 350 MG/ML injection 75 mL (75 mLs Intravenous Contrast Given 08/24/22 0137)  acetaminophen (TYLENOL) tablet 325 mg (325 mg Oral Given 08/24/22 0336)  sodium chloride 0.9 % bolus 500 mL (500 mLs Intravenous New Bag/Given 08/24/22 0427)    ED Course/ Medical Decision Making/ A&P                             Medical Decision Making Patient with mechanic fall at home hitting head on [place]   Amount and/or Complexity of Data Reviewed Independent Historian: EMS    Details: See above  External Data Reviewed: notes.    Details: Previous notes reviewed  Labs: ordered.    Details: All labs reviewed:  normal sodium 139, normal potassium on istat 4.0, creatinine 1.16 consistent with previous,  glucose 134.  Normal PT 14, normal INR 1.0.  white count 11.5, hemoglobin normal 12, normal platelet count 299  Radiology: ordered and independent interpretation performed. Decision-making details documented in ED Course.    Details: CXR normal in the trauma bay,  Negative non-contrast CT of brain, no fracture of C  spine on CT.  Normal Xray of the pelvis.   Risk OTC drugs. Prescription drug management. Risk Details: Patient had CT head, CT c-spine, CT chest abdomen and pelvis based on trauma protocol.  All CTs were negative for acute traumatic injury.  Labs are benign and reassuring.  Patient was hydrated in the ED.  Patient abrasions cleansed and bandaged.  Patient  PO challenged.  Patient is stable for discharge with close follow up.  I did Email patient's neurologist through [facility] at patient's daughter's request.      Final  Clinical Impression(s) / ED Diagnoses Final diagnoses:  Fall, initial encounter   Return for intractable cough, coughing up blood, fevers > 100.4 unrelieved by medication, shortness of breath, intractable vomiting, chest pain, shortness of breath, weakness, numbness, changes in speech, facial asymmetry, abdominal pain, passing out, Inability to tolerate liquids or food, cough, altered mental status or any concerns. No signs of systemic illness or infection. The patient is nontoxic-appearing on exam and vital signs are within normal limits.  I have reviewed the triage vital signs and the nursing notes. Pertinent labs & imaging results that were available during my care of the patient were reviewed by me and considered in my medical decision making (see chart for details). After history, exam, and medical workup I feel the patient has been appropriately medically screened and is safe for discharge home. Pertinent diagnoses were discussed with the patient. Patient was given return precautions Rx / DC Orders ED Discharge Orders     None         Keshonda Monsour, MD 08/24/22 [phone]

## 2022-08-24 NOTE — ED Triage Notes (Signed)
Pt arrived with [facility] for fall on Brilinta, Pt fell and tripped in the kitchen. Hematoma to posterior head. Neck pillow in place on arrival; ems unable to place c-collar due to hematoma and increased pain at that location. L leg notably shorter on exam. Pt denies hip or abd pain

## 2022-08-24 NOTE — Progress Notes (Signed)
   08/24/22 0345  Spiritual Encounters  Type of Visit Initial  Care provided to: Pt and family  Conversation partners present during encounter Nurse  Referral source Physician  Reason for visit Urgent spiritual support  OnCall Visit Yes  Spiritual Framework  Presenting Themes Goals in life/care;Values and beliefs;Impactful experiences and emotions  Community/Connection Family;Faith community  Patient Stress Factors Health changes  Family Stress Factors Loss of control;Other (Comment);Loss (advocating for patient)  Interventions  Spiritual Care Interventions Made Established relationship of care and support;Compassionate presence;Reflective listening;Normalization of emotions;Narrative/life review;Explored values/beliefs/practices/strengths;Bereavement/grief support;Prayer;Encouragement  Intervention Outcomes  Outcomes Connection to spiritual care;Awareness around self/spiritual resourses;Connection to values and goals of care;Awareness of support;Reduced anxiety  Spiritual Care Plan  Spiritual Care Issues Still Outstanding No further spiritual care needs at this time (see row info)   Chaplain provided emotional and spiritual support for patient, patient's daughter and great granddaughter. Daughter was frustrated with medical staff. Chaplain encouraged daughter to continue to advocate for the patient. Chaplain provided prayer as well as grief support for daughter as she had recently lost her spouse.   Arlyce Dice, Chaplain Resident

## 2022-08-24 NOTE — ED Provider Notes (Signed)
Daughter has been aggressive with EDP and is not happy with care as she has informed me on multiple occasions that her neurologist Dr. Everlena Cooper states the CT scans should be done with contrast.  EDP politely informed patient with nurses and techs present that Scans were done per trauma protocol as this was a mechanical fall but I would be happy to send a secure email through [employer] to her neurologist to let him know our rationale for doing the CT head and C spine without contrast. Patient's daughter has been to the doctor's office multiple times to voice complaints about how she would like her mother's care handled.  EDP has been polite and has spoke with daughter each time and has stated that the patient was given the best care per department and trauma policy.   Daughter does not want to take mother home.  All CTs are normal and patient has intact exam and vital signs.  I have emailed the patient's neurologist, however this is a mechanical fall and trauma policy has been strictly observed.  Moreover, the daughter cannot dictate care.  Patient is stable for discharge and has PO challenged.     Lean Fayson, MD 08/24/22 [phone]

## 2022-08-26 ENCOUNTER — Telehealth: Payer: Self-pay

## 2022-08-26 NOTE — Telephone Encounter (Signed)
Patient advised of CTA appt 09/04/22 at 3pm, arrive at 2:45 pm NPO 4 hours before.  Per Patient daughter she ddi not give her mother the Brinta 90 mg Saturday morning. Per Daughter she did not feel comfortable given it to her mother because no one could give her defentive answer if she should or shouldn't continue. Patient restarted the [facility] Saturday night.

## 2022-08-28 NOTE — Progress Notes (Cosign Needed)
08/28/22-Daughter, [name] called to go over her delivery real quick. I informed of time and meds coming out and daughter did not advise anything needed to change.  [name], CMA Clinical Pharmacist Assistant  [phone]

## 2022-08-29 ENCOUNTER — Telehealth: Payer: Self-pay

## 2022-08-29 NOTE — Telephone Encounter (Signed)
Transition Care Management Follow-up Telephone Call Date of discharge and from where: [facility] 6/1 How have you been since you were released from the hospital? Fine  Any questions or concerns? No  Items Reviewed: Did the pt receive and understand the discharge instructions provided? Yes  Medications obtained and verified? Yes  Other? No  Any new allergies since your discharge? No  Dietary orders reviewed? No Do you have support at home? Yes     Follow up appointments reviewed:  PCP Hospital f/u appt confirmed? NO Scheduled to see  on @ . Specialist Hospital f/u appt confirmed?  Scheduled to see  on  @ . Are transportation arrangements needed? No  If their condition worsens, is the pt aware to call PCP or go to the Emergency Dept.? Yes Was the patient provided with contact information for the PCP's office or ED? Yes Was to pt encouraged to call back with questions or concerns? Yes

## 2022-08-29 NOTE — Telephone Encounter (Signed)
Transition Care Management Unsuccessful Follow-up Telephone Call  Date of discharge and from where:  [facility] 6/1  Attempts:  1st Attempt  Reason for unsuccessful TCM follow-up call:  Left voice message  ED Follow Up call

## 2022-09-02 ENCOUNTER — Telehealth: Payer: Self-pay

## 2022-09-02 NOTE — Telephone Encounter (Signed)
I have only seen patient one time - as far as her chronic issues we can discuss at her follow up visit on 6/12 and about getting help set up for possible home health As far as missing appts - just recommend to daughter that if there is a issue to call and notify us if not going to make appt

## 2022-09-02 NOTE — Telephone Encounter (Signed)
[name] left a message on my phone about a warning letter that was sent to [place]. [name] stated that her mother received a warning letter for two DOS 02/12/2022 and 08/16/2022. [name] stated that on 02/12/2022 her mother was in the hospital and for the May DOS [name] was in the hospital. [name] is the one that brings her mother to and from appts. NOTE: [name] stated that [name] is always seen at [facility] for any emergency situations. I notified [name] of our office policy for cancellation and no shows.   [name] stated that she is needing help. [name] is wanting to see how she can see about a CNA to come out so may days during the week to help her. [name] sounded emotional on the phone. [name] stated that she is going to try to speak with Dr. Everlena Cooper about this too. Also, she stated that she would speak to Dr. Everlena Cooper about the Brilinta medication. She thinks she needs OT and PT back. Raul will not listen to [name] but she will listen to the nurses.   [name] was seen this month in the emergency department. [name] stated that she will follow-up with Dr. Everlena Cooper, and go from there on if the patient needs to be seen at our office or not.

## 2022-09-04 ENCOUNTER — Ambulatory Visit
Admission: RE | Admit: 2022-09-04 | Discharge: 2022-09-04 | Disposition: A | Discharge status: Home / Self Care | Payer: Medicare HMO | Source: Ambulatory Visit | Attending: Neurology | Admitting: Neurology

## 2022-09-04 DIAGNOSIS — I671 Cerebral aneurysm, nonruptured: Secondary | ICD-10-CM | POA: Insufficient documentation

## 2022-09-04 DIAGNOSIS — I6523 Occlusion and stenosis of bilateral carotid arteries: Secondary | ICD-10-CM | POA: Diagnosis not present

## 2022-09-04 MED ORDER — IOHEXOL 350 MG/ML SOLN
60.0000 mL | Freq: Once | INTRAVENOUS | Status: AC | PRN
  Administered 2022-09-04: 60 mL via INTRAVENOUS

## 2022-09-09 NOTE — Progress Notes (Signed)
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  Due to fall risk, would hold ASA.   Praluent.  Has statin intolerance.  LDL goal less than 70 Keep blood pressure 120-140/80-90  Hgb A1c goal less than 7 Continued monitoring of cerebral aneurysms Will reach out to Dr. Corliss Skains regarding if anything else needs to be done regarding the aneurysms. Advised to use the walker at all times in the house.  Follow up in 6 months.  Total time spent reviewing chart and face to face with patient, her daughter and granddaughter discussing results and plan:  65 minutes.     Subjective:  [name] is an 82 year old right-handed female with CHF, HTN, HLD, CKD and history of CVA who follows up for stroke and cerebral aneurysm.  History supplemented by her daughter.  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    She had another fall on 6/1 which sent  her to the ED.  CT head and C-spine personally reviewed posterior vertex scalp hematoma but otherwise revealed no acute abnormalities.  Since being on Brilinta, she has had larger hematomas, particularly with falls.  She has had at least 2 big falls that her family is aware of.  However, she has had other falls that they do not know about.     HISTORY: In September 2022, she started experiencing heaviness in her legs causing her to fall.  No associated dizziness or loss of consciousness.  Legs just give out.  She has pain in her legs but no numbness.  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  She previously used a cane but had to resort to a walker.  She also noticed some weakness in the right arm as well.  Venlafaxine was changed to Trintellix which helped but symptoms continued.  Daughter is concerned that she may be on too much antihypertensive medications.  CT head performed on 03/14/2021 showed chronic small vessel ischemic changes in the cerebral white matter with remote left cerebellar infarct but no acute intracranial abnormalities.  LDL was 71.  Serum glucose was 111.  TSH was 2.850.  B12 was low at 165 and she was started on monthly injections.  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  She was  referred to interventional neuroradiology where she underwent cerebral angiogram on 06/01/2021 who favored continued monitoring due to high risk.  Following the angiogram, she developed thigh hematoma and right leg weakness.  She went to the ED on 06/04/2021 where DVT was ruled out and was advised by IR to continue Plavix.  On 06/07/2021, she developed weakness of right hand.  PCP ordered MRV of head performed on 06/14/2021 which showed abnormal areas of enhancement suspicious for subacute infarcts in the left occipital and parietal lobes.  On 07/04/2021, she developed headache, aphasia, slurred speech and right facial droop so she returned to the ED on 07/04/2021 where MRI of brian again showed patchy left hemispheric infarcts and right PCA territory infarcts.  MRA head and neck again showed diffuse atheromatous disease with stenoses and cerebral aneurysms.  TEE showed LVEF 70-75% with grade I diastolic dysfunction but no cardiac source of emboli.  LDL was 78.  Hgb A1c was 5.  She was on Plavix prior to admission.  She was discharged on ASA 81mg  daily and Brilinta 90mg  twice daily for 3 months followed by Brilinta alone.     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  She also was found to have bilateral ICA stenosis.  Carotid ultrasound from 06/20/2020 showed 40-59% left ICA stenosis and 1-39% right ICA stenosis.  PAST MEDICAL HISTORY: Past Medical History:  Diagnosis Date   Acute embolic stroke (HCC)    Benign paroxysmal vertigo, unspecified ear 06/07/2019   CKD (chronic kidney disease), stage III (HCC)    GERD (gastroesophageal reflux disease)    Glaucoma    Incisional infection 12/25/2020    MEDICATIONS: Current Outpatient Medications on File Prior to Visit  Medication Sig Dispense Refill    Acetaminophen (TYLENOL) 325 MG CAPS Take 650 mg by mouth every 6 (six) hours as needed (pain).     Alirocumab (PRALUENT) 75 MG/ML SOAJ Inject 1 pen into the skin every 14 (fourteen) days. (Patient taking differently: Inject 75 mg into the skin every 14 (fourteen) days.) 6 mL 3   amLODipine (NORVASC) 10 MG tablet Take 1 tablet (10 mg total) by mouth daily. 90 tablet 1   BRILINTA 90 MG TABS tablet TAKE ONE TABLET BY MOUTH EVERY MORNING and TAKE ONE TABLET BY MOUTH EVERYDAY AT BEDTIME 60 tablet 2   D3-50 1.25 MG (50000 UT) capsule TAKE 1 CAPSULE BY MOUTH ONE TIME PER WEEK (Patient taking differently: Take 50,000 Units by mouth every Tuesday.) 12 capsule 3   famotidine (PEPCID) 20 MG tablet Take 1 tablet (20 mg total) by mouth 2 (two) times daily. 180 tablet 2   furosemide (LASIX) 20 MG tablet Take 1 tablet (20 mg total) by mouth daily as needed for edema. 90 tablet 1   latanoprost (XALATAN) 0.005 % ophthalmic solution Place 1 drop into both eyes at bedtime.     loratadine (CLARITIN) 10 MG tablet Take 10 mg by mouth daily as needed for allergies.     OVER THE COUNTER MEDICATION Apply 1 application. topically  daily as needed (knee pain). Hempvana pain relief cream     TRINTELLIX 10 MG TABS tablet TAKE ONE TABLET BY MOUTH EVERYDAY AT BEDTIME 90 tablet 1   valsartan (DIOVAN) 80 MG tablet Take 2 tablets (160 mg total) by mouth daily. If systolic BP greater than 140 mmHG give 160 mg by mouth daily, if systolic BP between 120-140 mmHG give 80 mg by mouth daily (Patient taking differently: Take 80 mg by mouth daily. If systolic BP greater than 140 mmHG give 80 mg by mouth daily, if systolic BP between 120-140 mmHG give 40 mg by mouth daily) 180 tablet 3   vitamin B-12 (CYANOCOBALAMIN) 1000 MCG tablet Take 1,000 mcg by mouth every evening.     No current facility-administered medications on file prior to visit.    ALLERGIES: Allergies  Allergen Reactions   Codeine Nausea And Vomiting   Gabapentin      Dizziness, couldn't function    Meclizine Other (See Comments)    Lethargy, extreme sleepiness   Requip [Ropinirole Hcl] Nausea And Vomiting   Statins     Muscle weakness    FAMILY HISTORY: Family History  Problem Relation Age of Onset   Hypertension Mother    Hypertension Father    Heart attack Father    Hypertension Brother    Diabetes Brother       Objective:  Blood pressure 139/84, pulse 81, height 5' (1.524 m), weight 155 lb (70.3 kg), SpO2 98 %. General: Depressed.  Patient appears well-groomed.   Head:  Normocephalic/atraumatic Eyes:  Fundi examined but not visualized Neck: supple, no paraspinal tenderness, full range of motion Heart:  Regular rate and rhythm Neurological Exam: Alert and oriented.  Speech fluent and not dysarthric.  Language intact.  Right ptosis.  Otherwise, CN II-XII intact.  Muscle strength 5-/5 upper extremities, 4+/5 right hip flexion and 5-/5 left hip flexion.  DTR 2+ throughout.  Finger to nose without dysmetria.  Broad-based gait.  Cannot safely ambulate without walker.  Romberg with sway.      Shon Millet, DO  CC: [name], MD

## 2022-09-10 ENCOUNTER — Encounter: Payer: Self-pay | Admitting: Neurology

## 2022-09-10 ENCOUNTER — Ambulatory Visit: Payer: Medicare HMO | Admitting: Neurology

## 2022-09-10 VITALS — BP 139/84 | HR 81 | Ht 60.0 in | Wt 155.0 lb

## 2022-09-10 DIAGNOSIS — E785 Hyperlipidemia, unspecified: Secondary | ICD-10-CM

## 2022-09-10 DIAGNOSIS — I63532 Cerebral infarction due to unspecified occlusion or stenosis of left posterior cerebral artery: Secondary | ICD-10-CM

## 2022-09-10 DIAGNOSIS — I679 Cerebrovascular disease, unspecified: Secondary | ICD-10-CM

## 2022-09-10 DIAGNOSIS — I671 Cerebral aneurysm, nonruptured: Secondary | ICD-10-CM

## 2022-09-10 MED ORDER — CLOPIDOGREL BISULFATE 75 MG PO TABS
75.0000 mg | ORAL_TABLET | Freq: Every day | ORAL | 11 refills | Status: DC
Start: 2022-09-10 — End: 2022-12-03

## 2022-09-10 NOTE — Patient Instructions (Signed)
Due to fall risk, would stop Brilinta and restart Plavix 75mg  daily.  Would hold off on aspirin due to bleeding risk  Use walker at all times

## 2022-09-18 ENCOUNTER — Other Ambulatory Visit: Payer: Self-pay | Admitting: Neurology

## 2022-09-18 DIAGNOSIS — I639 Cerebral infarction, unspecified: Secondary | ICD-10-CM

## 2022-09-18 DIAGNOSIS — I6930 Unspecified sequelae of cerebral infarction: Secondary | ICD-10-CM

## 2022-09-20 ENCOUNTER — Other Ambulatory Visit (HOSPITAL_COMMUNITY): Payer: Self-pay | Admitting: Interventional Radiology

## 2022-09-20 DIAGNOSIS — I639 Cerebral infarction, unspecified: Secondary | ICD-10-CM

## 2022-09-21 ENCOUNTER — Other Ambulatory Visit: Payer: Self-pay

## 2022-09-21 DIAGNOSIS — F331 Major depressive disorder, recurrent, moderate: Secondary | ICD-10-CM

## 2022-09-23 ENCOUNTER — Telehealth (HOSPITAL_COMMUNITY): Payer: Self-pay

## 2022-09-23 ENCOUNTER — Other Ambulatory Visit: Payer: Self-pay | Admitting: Neurology

## 2022-09-23 DIAGNOSIS — I6930 Unspecified sequelae of cerebral infarction: Secondary | ICD-10-CM

## 2022-09-23 DIAGNOSIS — I639 Cerebral infarction, unspecified: Secondary | ICD-10-CM

## 2022-09-23 MED ORDER — VORTIOXETINE HBR 10 MG PO TABS
ORAL_TABLET | ORAL | 0 refills | Status: DC
Start: 2022-09-23 — End: 2022-12-03

## 2022-09-23 NOTE — Telephone Encounter (Signed)
Called to schedule f/u, no answer, left vm. AB  

## 2022-09-26 ENCOUNTER — Other Ambulatory Visit: Payer: Self-pay | Admitting: Neurology

## 2022-09-26 DIAGNOSIS — I6930 Unspecified sequelae of cerebral infarction: Secondary | ICD-10-CM

## 2022-09-26 DIAGNOSIS — I639 Cerebral infarction, unspecified: Secondary | ICD-10-CM

## 2022-10-23 NOTE — Progress Notes (Signed)
CARDIOLOGY OFFICE NOTE  Date:  10/23/2022    [name] Date of Birth: 25-Mar-1941 Medical Record #[id number]  PCP:  Marianne Sofia, PA-C  Cardiologist:  Eden Emms    No chief complaint on file.   History of Present Illness:  82 y.o. History of HTN, LBBB, HLD. 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 Echo with EF 60-65% MRI with multiple infarcts in pons, cerebellar peduncle and significant intracranial vascular disease. Neuro suggested Permissive HTN and ASA/Plavix. 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 Intolerant to statins And was on niacin. 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 Did fine with hydration ECG with chronic LBBB and echo with normal EF no RWAM's or valve dx and no pericardial effusion   She takes Effexor for depression   Feels generally poorly no cardiac issues On praluent and Plavix for continued stroke Declined any stress testing due to aneurysms Mechanical fall seen in ER 08/24/22 Non contrast CT, C spine and pelvis negative  Not clear why praluent held Told her to resume. Brilinta changed to plavix due to falls and bleeding risks    Past Medical History:  Diagnosis Date   Acute embolic stroke (HCC)    Benign paroxysmal vertigo, unspecified ear 06/07/2019   CKD (chronic kidney disease), stage III (HCC)    GERD (gastroesophageal reflux disease)    Glaucoma    Incisional infection 12/25/2020    Past Surgical History:  Procedure Laterality Date    ABDOMINAL HYSTERECTOMY     BACK SURGERY     cataract surgery     COLONOSCOPY N/A 08/03/2015   Procedure: COLONOSCOPY;  Surgeon: Carman Ching, MD;  Location: [facility] ENDOSCOPY;  Service: Endoscopy;  Laterality: N/A;   ESOPHAGOGASTRODUODENOSCOPY N/A 07/31/2015   Procedure: ESOPHAGOGASTRODUODENOSCOPY (EGD);  Surgeon: Carman Ching, MD;  Location: [facility] ENDOSCOPY;  Service: Endoscopy;  Laterality: N/A;   IR ANGIO INTRA EXTRACRAN SEL COM CAROTID INNOMINATE BILAT MOD SED  06/01/2021   IR ANGIO VERTEBRAL SEL SUBCLAVIAN INNOMINATE UNI L MOD SED  06/01/2021   IR RADIOLOGIST EVAL & MGMT  04/26/2021   OOPHORECTOMY     TOTAL HIP ARTHROPLASTY     URETER SURGERY       Medications: Current Outpatient Medications  Medication Sig Dispense Refill   Acetaminophen (TYLENOL) 325 MG CAPS Take 650 mg by mouth every 6 (six) hours as needed (pain).     Alirocumab (PRALUENT) 75 MG/ML SOAJ Inject 1 pen  into the skin every 14 (fourteen) days. (Patient not taking: Reported on 09/10/2022) 6 mL 3   amLODipine (NORVASC) 10 MG tablet Take 1 tablet (10 mg total) by mouth daily. 90 tablet 1   Cholecalciferol (D3-50) 1.25 MG (50000 UT) capsule TAKE 1 CAPSULE BY MOUTH ONE TIME PER WEEK Strength: 1.25 MG (50000 UT) 12 capsule 3   clopidogrel (PLAVIX) 75 MG tablet Take 1 tablet (75 mg total) by mouth daily. 30 tablet 11   famotidine (PEPCID) 20 MG tablet Take 1 tablet (20 mg total)  by mouth 2 (two) times daily. 180 tablet 2   furosemide (LASIX) 20 MG tablet Take 1 tablet (20 mg total) by mouth daily as needed for edema. 90 tablet 1   latanoprost (XALATAN) 0.005 % ophthalmic solution Place 1 drop into both eyes at bedtime.     loratadine (CLARITIN) 10 MG tablet Take 10 mg by mouth daily as needed for allergies.     OVER THE COUNTER MEDICATION Apply 1 application. topically daily as needed (knee pain). Hempvana pain relief cream     valsartan (DIOVAN) 80 MG tablet Take 2 tablets (160 mg total) by mouth daily. If systolic BP greater than 140  mmHG give 160 mg by mouth daily, if systolic BP between 120-140 mmHG give 80 mg by mouth daily 180 tablet 3   vitamin B-12 (CYANOCOBALAMIN) 1000 MCG tablet Take 1,000 mcg by mouth every evening.     vortioxetine HBr (TRINTELLIX) 10 MG TABS tablet TAKE ONE TABLET BY MOUTH EVERYDAY AT BEDTIME 90 tablet 0   No current facility-administered medications for this visit.    Allergies: Allergies  Allergen Reactions   Codeine Nausea And Vomiting   Gabapentin     Dizziness, couldn't function    Meclizine Other (See Comments)    Lethargy, extreme sleepiness   Requip [Ropinirole Hcl] Nausea And Vomiting   Statins     Muscle weakness    Social History: The patient  reports that she has never smoked. She has never used smokeless tobacco. She reports that she does not drink alcohol and does not use drugs.   Family History: The patient's family history includes Diabetes in her brother; Heart attack in her father; Hypertension in her brother, father, and mother.   Review of Systems: Please see the history of present illness.   Otherwise, the review of systems is positive for none.   All other systems are reviewed and negative.   Physical Exam: VS:  There were no vitals taken for this visit. [name]  BMI There is no height or weight on file to calculate BMI.  Wt Readings from Last 3 Encounters:  09/10/22 155 lb (70.3 kg)  08/24/22 172 lb 13.5 oz (78.4 kg)  07/09/22 238 lb (108 kg)    Affect appropriate Healthy:  appears stated age HEENT: normal Neck supple with no adenopathy JVP normal bilateral carotid  Bruits high pitched on left  no thyromegaly Lungs clear with no wheezing and good diaphragmatic motion Heart:  S1/S2  SEM murmur, no rub, gallop or click PMI normal Abdomen: benighn, BS positve, no tenderness, no AAA no bruit.  No HSM or HJR Distal pulses intact with no bruits No edema Neuro non-focal Skin warm and dry No muscular weakness    LABORATORY DATA:  EKG: 08/02/15   NSR  with LBBB. 05/25/18 SR rate 75 LBBB LAD no changes   Lab Results  Component Value Date   WBC 11.5 (H) 08/24/2022   HGB 12.2 08/24/2022   HCT 36.0 08/24/2022   PLT 299 08/24/2022   GLUCOSE 136 (H) 08/24/2022   CHOL 287 (H) 11/07/2021   TRIG 439 (H) 11/07/2021   HDL 38 (L) 11/07/2021   LDLCALC 164 (H) 11/07/2021   ALT 18 08/24/2022   AST 17 08/24/2022   NA 137 08/24/2022   K 5.3 (H) 08/24/2022   CL 107 08/24/2022   CREATININE 1.20 (H) 08/24/2022   BUN 40 (H) 08/24/2022   CO2 19 (L) 08/24/2022   TSH 1.210 11/07/2021   INR 1.1 08/24/2022  HGBA1C 5.0 07/05/2021    BNP (last 3 results) No results for input(s): "BNP" in the last 8760 hours.  ProBNP (last 3 results) No results for input(s): "PROBNP" in the last 8760 hours.   Other Studies Reviewed Today:  Echo Study Conclusions from 07/05/21  EF 70-75% no AS or significant valve dx  Assessment/Plan: 1. CVA:  DAT f/u neurology duplex left carotid 06/20/20  40-59% LICA f/u due Ataxia from old cerebellar infarct with poor balance Continue Plavix f/u Jaffe/Sethi Will let neuro follow her ICA and intracranial issues  ASA stopped an dBrilinta changed to Plavix due to falls/risk Needs to use walker to prevent falls Carotid duplex to further assess non intra cranial ICA;s  2. HTN Well controlled.  Continue current medications and low sodium Dash type diet.    3. HLD on Praluent labs with primary   4. Chronic LBBB stable no high grade AV block   5. Murmur:  TTE reviewed 05/25/19 AV sclerosis mean gradient 10 mmHg  no stenosis EF normal 60-65% Recent TTE does not mention any AS or gradients 07/05/21   6. Hiatal Hernia:  CT 08/24/22 noted most of her stomach is intrathoracic   Carotid duplex  F/U in a year     [employer]

## 2022-10-28 ENCOUNTER — Ambulatory Visit: Payer: Medicare HMO | Attending: Cardiovascular Disease | Admitting: Cardiovascular Disease

## 2022-10-28 ENCOUNTER — Other Ambulatory Visit: Payer: Self-pay | Admitting: Cardiovascular Disease

## 2022-10-28 ENCOUNTER — Encounter: Payer: Self-pay | Admitting: Cardiovascular Disease

## 2022-10-28 VITALS — BP 150/68 | HR 71 | Ht 60.0 in | Wt 154.0 lb

## 2022-10-28 DIAGNOSIS — R0989 Other specified symptoms and signs involving the circulatory and respiratory systems: Secondary | ICD-10-CM

## 2022-10-28 DIAGNOSIS — R011 Cardiac murmur, unspecified: Secondary | ICD-10-CM

## 2022-10-28 DIAGNOSIS — E785 Hyperlipidemia, unspecified: Secondary | ICD-10-CM | POA: Diagnosis not present

## 2022-10-28 DIAGNOSIS — G459 Transient cerebral ischemic attack, unspecified: Secondary | ICD-10-CM

## 2022-10-28 MED ORDER — PRALUENT 75 MG/ML ~~LOC~~ SOAJ: 75.0000 mg | SUBCUTANEOUS | 3 refills | Status: DC

## 2022-10-28 NOTE — Patient Instructions (Signed)
Medication Instructions:  Your physician recommends that you continue on your current medications as directed. Please refer to the Current Medication list given to you today.  *If you need a refill on your cardiac medications before your next appointment, please call your pharmacy*  Lab Work: If you have labs (blood work) drawn today and your tests are completely normal, you will receive your results only by: [place] (if you have MyChart) OR A paper copy in the mail If you have any lab test that is abnormal or we need to change your treatment, we will call you to review the results.  Testing/Procedures: Your physician has requested that you have a carotid duplex. This test is an ultrasound of the carotid arteries in your neck. It looks at blood flow through these arteries that supply the brain with blood. Allow one hour for this exam. There are no restrictions or special instructions.   Follow-Up: At [facility], you and your health needs are our priority.  As part of our continuing mission to provide you with exceptional heart care, we have created designated Provider Care Teams.  These Care Teams include your primary Cardiologist (physician) and Advanced Practice Providers (APPs -  Physician Assistants and Nurse Practitioners) who all work together to provide you with the care you need, when you need it.  We recommend signing up for the patient portal called "MyChart".  Sign up information is provided on this After Visit Summary.  MyChart is used to connect with patients for Virtual Visits (Telemedicine).  Patients are able to view lab/test results, encounter notes, upcoming appointments, etc.  Non-urgent messages can be sent to your provider as well.   To learn more about what you can do with MyChart, go to [email or web address].    Your next appointment:   1 year(s)  Provider:   Jenkins Rouge, MD

## 2022-10-28 NOTE — Addendum Note (Signed)
Addended by: Virl Axe,  L on: 10/28/2022 10:56 AM   Modules accepted: Orders

## 2022-10-30 ENCOUNTER — Other Ambulatory Visit: Payer: Self-pay | Admitting: Neurology

## 2022-10-30 DIAGNOSIS — I639 Cerebral infarction, unspecified: Secondary | ICD-10-CM

## 2022-10-30 DIAGNOSIS — I6930 Unspecified sequelae of cerebral infarction: Secondary | ICD-10-CM

## 2022-11-04 ENCOUNTER — Other Ambulatory Visit (HOSPITAL_COMMUNITY): Payer: Self-pay

## 2022-11-04 ENCOUNTER — Telehealth: Payer: Self-pay

## 2022-11-04 NOTE — Telephone Encounter (Signed)
Pharmacy Patient Advocate Encounter   Received notification from Physician's Office/PHARMD MACCIA that prior authorization for REPATHA is required/requested.   Insurance verification completed.   The patient is insured through [facility] .   Per test claim: PA required; PA submitted to [facility] via CoverMyMeds Key/confirmation #/[id number]  Status is pending

## 2022-11-04 NOTE — Telephone Encounter (Signed)
PA has been approved through 03/25/23.  Called pt and LVM for her to call back. Was previously on Praluent. Insurance now covers Repatha. Her [employer] grant has been swept but she might be able to call and get money added back to account. Has not expired.

## 2022-11-05 ENCOUNTER — Other Ambulatory Visit (HOSPITAL_COMMUNITY): Payer: Self-pay

## 2022-11-05 NOTE — Telephone Encounter (Signed)
Pharmacy Patient Advocate Encounter  Received notification from  [employer]  that Prior Authorization for Repatha SureClick 140MG /ML auto-injectors has been APPROVED from 03/25/2022 to 03/25/2023. Ran test claim, Copay is $138.05. This test claim was processed through [facility]- copay amounts may vary at other pharmacies due to pharmacy/plan contracts, or as the patient moves through the different stages of their insurance plan.   PA #/Case ID/Reference #: [id number]

## 2022-11-05 NOTE — Telephone Encounter (Signed)
Please see separate encounter.

## 2022-11-11 ENCOUNTER — Ambulatory Visit (HOSPITAL_COMMUNITY)
Admission: RE | Admit: 2022-11-11 | Discharge: 2022-11-11 | Disposition: A | Discharge status: Home / Self Care | Payer: Medicare HMO | Source: Ambulatory Visit | Attending: Cardiovascular Disease | Admitting: Cardiovascular Disease

## 2022-11-11 DIAGNOSIS — G459 Transient cerebral ischemic attack, unspecified: Secondary | ICD-10-CM | POA: Diagnosis not present

## 2022-11-11 DIAGNOSIS — R0989 Other specified symptoms and signs involving the circulatory and respiratory systems: Secondary | ICD-10-CM | POA: Insufficient documentation

## 2022-11-14 MED ORDER — REPATHA SURECLICK 140 MG/ML ~~LOC~~ SOAJ: 1.0000 mL | SUBCUTANEOUS | 11 refills | Status: DC

## 2022-11-14 NOTE — Telephone Encounter (Signed)
Left VM for daughter karen to explain the change to Repatha. [facility]. To use [employer], they will need to call healthwell since grant has been swept

## 2022-11-14 NOTE — Addendum Note (Signed)
Addended by: Malena Peer D on: 11/14/2022 11:44 AM   Modules accepted: Orders

## 2022-11-14 NOTE — Addendum Note (Signed)
Addended by: Malena Peer D on: 11/14/2022 09:54 AM   Modules accepted: Orders

## 2022-11-14 NOTE — Telephone Encounter (Signed)
I spoke with patients daughter about [id number]. Made aware that plan covers Repatha. Cannot afford copay. She has [employer] grant that was approved through 04/24/23. It has been swept but I gave daughter phone # to call [employer]well.

## 2022-11-15 ENCOUNTER — Ambulatory Visit: Payer: Medicare HMO

## 2022-11-15 VITALS — BP 128/82 | HR 68 | Temp 97.7°F | Ht 60.0 in | Wt 155.0 lb

## 2022-11-15 DIAGNOSIS — I10 Essential (primary) hypertension: Secondary | ICD-10-CM

## 2022-11-15 DIAGNOSIS — I69351 Hemiplegia and hemiparesis following cerebral infarction affecting right dominant side: Secondary | ICD-10-CM | POA: Diagnosis not present

## 2022-11-15 DIAGNOSIS — K219 Gastro-esophageal reflux disease without esophagitis: Secondary | ICD-10-CM

## 2022-11-15 DIAGNOSIS — N1831 Chronic kidney disease, stage 3a: Secondary | ICD-10-CM

## 2022-11-15 DIAGNOSIS — R5382 Chronic fatigue, unspecified: Secondary | ICD-10-CM

## 2022-11-15 DIAGNOSIS — F331 Major depressive disorder, recurrent, moderate: Secondary | ICD-10-CM

## 2022-11-15 DIAGNOSIS — E782 Mixed hyperlipidemia: Secondary | ICD-10-CM

## 2022-11-15 DIAGNOSIS — I6521 Occlusion and stenosis of right carotid artery: Secondary | ICD-10-CM

## 2022-11-15 NOTE — Progress Notes (Signed)
Subjective:  Patient ID: [name], female    DOB: 1940/07/11  Age: 82 y.o. MRN: [id number]  Chief Complaint  Patient presents with   Medical Management of Chronic Issues    HPI  Daanya is here with her daughter and great granddaughter.  Patient's daughter is the primary historian.  Patient does add to the history.  Daughter states she sleeps a whole lot. She walks around the house with walker. Meals on wheels handles lunch. Daughters give supper.  Daughter lives 7 miles away.  Does not seem to want to go to her daughters home. Has a puppy. Bowen has also been sad as her husband is currently hospitalized.   Hypertension: Valsartan 80-40 mg daily per daughter, Amlodipine 10 mg daily.  Hyperlipidemia: Repatha 140 mg injection once every two weeks. Has not taken it in 3 months.   GERD: Famotidine 20 mg 1 tablet twice daily.   History of Multiple Aneurysms: Patient is currently on Plavix 75 mg which is managed my Dr. Everlena Cooper with [facility].    Depression: Patient is currently taking Trintellix 10 mg take 1 tablet daily.      11/15/2022   10:56 AM 04/04/2022   11:51 AM 11/07/2021    2:10 PM 02/06/2021    2:15 PM 12/18/2020   11:01 AM  Depression screen PHQ 2/9  Decreased Interest 0 2 0 1 3  Down, Depressed, Hopeless 0 0 0 3 0  PHQ - 2 Score 0 2 0 4 3  Altered sleeping 2 1 0 3 3  Tired, decreased energy 3 2 0 3 3  Change in appetite 0 1 0 0 2  Feeling bad or failure about yourself  0 1 0 0 0  Trouble concentrating 1 2 0 0 0  Moving slowly or fidgety/restless 0 1 0 2 0  Suicidal thoughts 0 0 0 0 0  PHQ-9 Score 6 10 0 12 11  Difficult doing work/chores Not difficult at all Somewhat difficult Not difficult at all Not difficult at all Somewhat difficult        11/15/2022   10:59 AM  Fall Risk   Falls in the past year? 0  Number falls in past yr: 0  Injury with Fall? 0  Risk for fall due to : Impaired balance/gait  Follow up Falls evaluation completed     Patient Care Team: Windell Moment, MD as PCP - General (Family Medicine) Wendall Stade, MD as PCP - Cardiology (Cardiology) Wendall Stade, MD as Consulting Physician (Cardiology) Zettie Pho, [facility] (Inactive) (Pharmacist)   Review of Systems  Constitutional:  Positive for fatigue. Negative for appetite change and fever.  HENT:  Negative for congestion, ear pain, sinus pressure and sore throat.   Respiratory:  Negative for cough, chest tightness, shortness of breath and wheezing.   Cardiovascular:  Negative for chest pain and palpitations.  Gastrointestinal:  Negative for abdominal pain, constipation, diarrhea, nausea and vomiting.  Genitourinary:  Negative for dysuria and hematuria.  Musculoskeletal:  Negative for arthralgias, back pain, joint swelling and myalgias.  Skin:  Negative for rash.  Neurological:  Negative for dizziness, weakness and headaches.  Psychiatric/Behavioral:  Negative for dysphoric mood. The patient is not nervous/anxious.     Current Outpatient Medications on File Prior to Visit  Medication Sig Dispense Refill   Acetaminophen (TYLENOL) 325 MG CAPS Take 650 mg by mouth every 6 (six) hours as needed (pain).     amLODipine (NORVASC) 10 MG tablet Take 1  tablet (10 mg total) by mouth daily. 90 tablet 1   Cholecalciferol (D3-50) 1.25 MG (50000 UT) capsule TAKE 1 CAPSULE BY MOUTH ONE TIME PER WEEK Strength: 1.25 MG (50000 UT) 12 capsule 3   clopidogrel (PLAVIX) 75 MG tablet Take 1 tablet (75 mg total) by mouth daily. 30 tablet 11   Evolocumab (REPATHA SURECLICK) 140 MG/ML SOAJ Inject 140 mg into the skin every 14 (fourteen) days. 2 mL 11   famotidine (PEPCID) 20 MG tablet Take 1 tablet (20 mg total) by mouth 2 (two) times daily. 180 tablet 2   furosemide (LASIX) 20 MG tablet Take 1 tablet (20 mg total) by mouth daily as needed for edema. 90 tablet 1   latanoprost (XALATAN) 0.005 % ophthalmic solution Place 1 drop into both eyes at bedtime.     loratadine  (CLARITIN) 10 MG tablet Take 10 mg by mouth daily as needed for allergies.     OVER THE COUNTER MEDICATION Apply 1 application. topically daily as needed (knee pain). Hempvana pain relief cream     valsartan (DIOVAN) 80 MG tablet Take 2 tablets (160 mg total) by mouth daily. If systolic BP greater than 140 mmHG give 160 mg by mouth daily, if systolic BP between 120-140 mmHG give 80 mg by mouth daily 180 tablet 3   vitamin B-12 (CYANOCOBALAMIN) 1000 MCG tablet Take 1,000 mcg by mouth every evening.     vortioxetine HBr (TRINTELLIX) 10 MG TABS tablet TAKE ONE TABLET BY MOUTH EVERYDAY AT BEDTIME 90 tablet 0   No current facility-administered medications on file prior to visit.   Past Medical History:  Diagnosis Date   Acute embolic stroke (HCC)    Benign paroxysmal vertigo, unspecified ear 06/07/2019   CKD (chronic kidney disease), stage III (HCC)    GERD (gastroesophageal reflux disease)    Glaucoma    Incisional infection 12/25/2020   Past Surgical History:  Procedure Laterality Date   ABDOMINAL HYSTERECTOMY     BACK SURGERY     cataract surgery     COLONOSCOPY N/A 08/03/2015   Procedure: COLONOSCOPY;  Surgeon: Carman Ching, MD;  Location: [facility] ENDOSCOPY;  Service: Endoscopy;  Laterality: N/A;   ESOPHAGOGASTRODUODENOSCOPY N/A 07/31/2015   Procedure: ESOPHAGOGASTRODUODENOSCOPY (EGD);  Surgeon: Carman Ching, MD;  Location: [facility] ENDOSCOPY;  Service: Endoscopy;  Laterality: N/A;   IR ANGIO INTRA EXTRACRAN SEL COM CAROTID INNOMINATE BILAT MOD SED  06/01/2021   IR ANGIO VERTEBRAL SEL SUBCLAVIAN INNOMINATE UNI L MOD SED  06/01/2021   IR RADIOLOGIST EVAL & MGMT  04/26/2021   OOPHORECTOMY     TOTAL HIP ARTHROPLASTY     URETER SURGERY      Family History  Problem Relation Age of Onset   Hypertension Mother    Hypertension Father    Heart attack Father    Hypertension Brother    Diabetes Brother    Social History   Socioeconomic History   Marital status: Married    Spouse name: Not on file    Number of children: Not on file   Years of education: Not on file   Highest education level: Not on file  Occupational History   Not on file  Tobacco Use   Smoking status: Never   Smokeless tobacco: Never  Vaping Use   Vaping status: Never Used  Substance and Sexual Activity   Alcohol use: No   Drug use: No   Sexual activity: Not Currently  Other Topics Concern   Not on file  Social History Narrative  Not on file   Social Determinants of Health   Financial Resource Strain: Low Risk  (11/06/2021)   Overall Financial Resource Strain (CARDIA)    Difficulty of Paying Living Expenses: Not hard at all  Food Insecurity: No Food Insecurity (11/06/2021)   Hunger Vital Sign    Worried About Running Out of Food in the Last Year: Never true    Ran Out of Food in the Last Year: Never true  Transportation Needs: No Transportation Needs (11/06/2021)   PRAPARE - Administrator, Civil Service (Medical): No    Lack of Transportation (Non-Medical): No  Physical Activity: Insufficiently Active (07/12/2021)   Exercise Vital Sign    Days of Exercise per Week: 2 days    Minutes of Exercise per Session: 30 min  Stress: No Stress Concern Present (10/28/2019)   Received from [facility], [facility] of Occupational Health - Occupational Stress Questionnaire    Feeling of Stress : Only a little  Social Connections: Moderately Integrated (11/06/2021)   Social Connection and Isolation Panel [NHANES]    Frequency of Communication with Friends and Family: Not on file    Frequency of Social Gatherings with Friends and Family: More than three times a week    Attends Religious Services: Never    Database administrator or Organizations: Yes    Attends Banker Meetings: Never    Marital Status: Married    Objective:  BP 128/82 (BP Location: Left Arm, Patient Position: Sitting)   Pulse 68   Temp 97.7 F (36.5 C) (Temporal)   Ht 5' (1.524 m)   Wt 155 lb  (70.3 kg)   SpO2 97%   BMI 30.27 kg/m      11/15/2022   10:44 AM 10/28/2022   10:23 AM 09/10/2022   10:41 AM  BP/Weight  Systolic BP 128 150 139  Diastolic BP 82 68 84  Wt. (Lbs) 155 154 155  BMI 30.27 kg/m2 30.08 kg/m2 30.27 kg/m2    Physical Exam Vitals and nursing note reviewed.  Constitutional:      Comments: Sitting in wheelchair, no acute distress  Cardiovascular:     Rate and Rhythm: Normal rate and regular rhythm.     Comments: Bilateral carotid bruit noted Ejection systolic murmur noted Pulmonary:     Effort: Pulmonary effort is normal.     Breath sounds: Normal breath sounds.  Neurological:     Comments: 4 x 5 strength in right upper and lower extremities,  Psychiatric:     Comments: Appears withdrawn     Diabetic Foot Exam - Simple   No data filed      Lab Results  Component Value Date   WBC 11.5 (H) 08/24/2022   HGB 12.2 08/24/2022   HCT 36.0 08/24/2022   PLT 299 08/24/2022   GLUCOSE 136 (H) 08/24/2022   CHOL 287 (H) 11/07/2021   TRIG 439 (H) 11/07/2021   HDL 38 (L) 11/07/2021   LDLCALC 164 (H) 11/07/2021   ALT 18 08/24/2022   AST 17 08/24/2022   NA 137 08/24/2022   K 5.3 (H) 08/24/2022   CL 107 08/24/2022   CREATININE 1.20 (H) 08/24/2022   BUN 40 (H) 08/24/2022   CO2 19 (L) 08/24/2022   TSH 1.210 11/07/2021   INR 1.1 08/24/2022   HGBA1C 5.0 07/05/2021      Assessment & Plan:    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 Vasculopath. Follows with neurology for history of stroke. Recent carotid US done on 11/11/22 showed no significant stenosis which is reassuring.    Mixed hyperlipidemia Assessment & Plan: Supposed to be on Repatha, but she has been off of it due to her recent falls.  Was told by neurology to immediately go back on it.  Daughter requests to have lipids done today  Ordered lipid panel  Orders: -      Comprehensive metabolic panel -     Lipid panel  Gastroesophageal reflux disease, unspecified whether esophagitis present Assessment & Plan: On pepcid 20 mg TWICE DAILY Not on a PPI at this time.  Continue the same meds    Chronic kidney disease, stage 3a (HCC) Assessment & Plan: Secondary to chronic hypertension  Plan: will check metabolic profile Knows to avoid nephrotoxic drugs   Orders: -     Comprehensive metabolic panel  Carotid stenosis, non-symptomatic, right Assessment & Plan: Latest carotid US on 11/11/22 showed less than 50% stenosis bilaterally Denies dizziness/ presyncope/ syncope   Follows up with neurology   Moderate recurrent major depression (HCC) Assessment & Plan: Symptoms decently controlled on Trintellix 10 mg daily She does complain of fatigue. Could be multifactorial including from her depression.   Essential (primary) hypertension Assessment & Plan: BP well controlled today Recommended range by neurology is to keep the systolic between 409-811.  No change in meds. Continue amlodipine 10 mg daily, Diovan 160 mg daily Will check metabolic profile for kidney function     Chronic fatigue Assessment & Plan: Etiology could be multifactorial- including her multiple comorbidities, major depression, history of anemia and B12 deficiency and the stress of living along while her husband is hospitalized.  PLAN: ordered blood work to rule out anemia/ b12/vitamin d deficiency Advised to keep busy with activities that appeal to her Move around as tolerated  Orders: -     CBC with Differential/Platelet -     TSH -     Vitamin B12 -     VITAMIN D 25 Hydroxy (Vit-D Deficiency, Fractures)     No orders of the defined types were placed in this encounter.   Orders Placed This Encounter  Procedures   CBC with Differential/Platelet   Comprehensive metabolic panel   Lipid panel   TSH   Vitamin B12   Vitamin D, 25-hydroxy     Follow-up: No  follow-ups on file.  Total time spent on today's visit was greater than 40 minutes, including both face-to-face time and nonface-to-face time personally spent on review of chart (labs and imaging), discussing labs and goals, discussing further work-up, treatment options, referrals to specialist if needed, reviewing outside records of pertinent, answering patient's questions, and coordinating care.    I,Lauren M Auman,acting as a Neurosurgeon for [employer], MD.,have documented all relevant documentation on the behalf of Windell Moment, MD,as directed by  Windell Moment, MD while in the presence of Windell Moment, MD.   An After Visit Summary was printed and given to the patient.  Windell Moment, MD [facility] [phone]

## 2022-11-15 NOTE — Assessment & Plan Note (Addendum)
BP well controlled today Recommended range by neurology is to keep the systolic between 425-956.  No change in meds. Continue amlodipine 10 mg daily, Diovan 160 mg daily Will check metabolic profile for kidney function

## 2022-11-15 NOTE — Assessment & Plan Note (Signed)
Strength 4/5 in her right upper and lower extremity. Vasculopath. Follows with neurology for history of stroke. Recent carotid US done on 11/11/22 showed no significant stenosis which is reassuring.

## 2022-11-15 NOTE — Assessment & Plan Note (Signed)
Latest carotid US on 11/11/22 showed less than 50% stenosis bilaterally Denies dizziness/ presyncope/ syncope   Follows up with neurology

## 2022-11-15 NOTE — Assessment & Plan Note (Signed)
Secondary to chronic hypertension  Plan: will check metabolic profile Knows to avoid nephrotoxic drugs

## 2022-11-15 NOTE — Assessment & Plan Note (Signed)
On pepcid 20 mg TWICE DAILY Not on a PPI at this time.  Continue the same meds

## 2022-11-15 NOTE — Assessment & Plan Note (Signed)
Supposed to be on Repatha, but she has been off of it due to her recent falls.  Was told by neurology to immediately go back on it.  Daughter requests to have lipids done today  Ordered lipid panel

## 2022-11-15 NOTE — Assessment & Plan Note (Signed)
Symptoms decently controlled on Trintellix 10 mg daily She does complain of fatigue. Could be multifactorial including from her depression.

## 2022-11-15 NOTE — Assessment & Plan Note (Signed)
Etiology could be multifactorial- including her multiple comorbidities, major depression, history of anemia and B12 deficiency and the stress of living along while her husband is hospitalized.  PLAN: ordered blood work to rule out anemia/ b12/vitamin d deficiency Advised to keep busy with activities that appeal to her Move around as tolerated

## 2022-11-16 LAB — COMPREHENSIVE METABOLIC PANEL
ALT: 12 IU/L (ref 0–32)
AST: 12 IU/L (ref 0–40)
Albumin: 3.9 g/dL (ref 3.7–4.7)
Alkaline Phosphatase: 132 IU/L — ABNORMAL HIGH (ref 44–121)
BUN/Creatinine Ratio: 23 (ref 12–28)
BUN: 23 mg/dL (ref 8–27)
Bilirubin Total: 0.2 mg/dL (ref 0.0–1.2)
CO2: 21 mmol/L (ref 20–29)
Calcium: 10.2 mg/dL (ref 8.7–10.3)
Chloride: 109 mmol/L — ABNORMAL HIGH (ref 96–106)
Creatinine, Ser: 1 mg/dL (ref 0.57–1.00)
Globulin, Total: 2.8 g/dL (ref 1.5–4.5)
Glucose: 99 mg/dL (ref 70–99)
Potassium: 4.7 mmol/L (ref 3.5–5.2)
Sodium: 143 mmol/L (ref 134–144)
Total Protein: 6.7 g/dL (ref 6.0–8.5)
eGFR: 56 mL/min/{1.73_m2} — ABNORMAL LOW (ref >59)

## 2022-11-16 LAB — LIPID PANEL
Chol/HDL Ratio: 6.1 ratio — ABNORMAL HIGH (ref 0.0–4.4)
Cholesterol, Total: 250 mg/dL — ABNORMAL HIGH (ref 100–199)
HDL: 41 mg/dL (ref >39)
LDL Chol Calc (NIH): 173 mg/dL — ABNORMAL HIGH (ref 0–99)
Triglycerides: 195 mg/dL — ABNORMAL HIGH (ref 0–149)
VLDL Cholesterol Cal: 36 mg/dL (ref 5–40)

## 2022-11-16 LAB — CBC WITH DIFFERENTIAL/PLATELET
Basophils Absolute: 0 10*3/uL (ref 0.0–0.2)
Basos: 0 %
EOS (ABSOLUTE): 0.1 10*3/uL (ref 0.0–0.4)
Eos: 2 %
Hematocrit: 38.1 % (ref 34.0–46.6)
Hemoglobin: 12.1 g/dL (ref 11.1–15.9)
Immature Grans (Abs): 0 10*3/uL (ref 0.0–0.1)
Immature Granulocytes: 0 %
Lymphocytes Absolute: 1.1 10*3/uL (ref 0.7–3.1)
Lymphs: 20 %
MCH: 29.6 pg (ref 26.6–33.0)
MCHC: 31.8 g/dL (ref 31.5–35.7)
MCV: 93 fL (ref 79–97)
Monocytes Absolute: 0.5 10*3/uL (ref 0.1–0.9)
Monocytes: 10 %
Neutrophils Absolute: 3.6 10*3/uL (ref 1.4–7.0)
Neutrophils: 68 %
Platelets: 299 10*3/uL (ref 150–450)
RBC: 4.09 x10E6/uL (ref 3.77–5.28)
RDW: 12.1 % (ref 11.7–15.4)
WBC: 5.4 10*3/uL (ref 3.4–10.8)

## 2022-11-16 LAB — TSH: TSH: 1.92 u[IU]/mL (ref 0.450–4.500)

## 2022-11-16 LAB — VITAMIN D 25 HYDROXY (VIT D DEFICIENCY, FRACTURES): Vit D, 25-Hydroxy: 59.8 ng/mL (ref 30.0–100.0)

## 2022-11-16 LAB — LITHOLINK CKD PROGRAM

## 2022-11-16 LAB — VITAMIN B12: Vitamin B-12: 1694 pg/mL — ABNORMAL HIGH (ref 232–1245)

## 2022-11-19 ENCOUNTER — Other Ambulatory Visit: Payer: Self-pay | Admitting: Neurology

## 2022-11-29 ENCOUNTER — Telehealth: Payer: Self-pay

## 2022-11-29 NOTE — Telephone Encounter (Signed)
[facility] in [place] has called stating that patient's pharmacy has went out of business so the patient is switching all of her scripts to there. He is requesting if we can send refills for all of her medications to them so they can have them on file.

## 2022-12-03 ENCOUNTER — Other Ambulatory Visit: Payer: Self-pay

## 2022-12-03 DIAGNOSIS — I10 Essential (primary) hypertension: Secondary | ICD-10-CM

## 2022-12-03 DIAGNOSIS — F331 Major depressive disorder, recurrent, moderate: Secondary | ICD-10-CM

## 2022-12-03 DIAGNOSIS — E559 Vitamin D deficiency, unspecified: Secondary | ICD-10-CM

## 2022-12-03 DIAGNOSIS — K219 Gastro-esophageal reflux disease without esophagitis: Secondary | ICD-10-CM

## 2022-12-03 MED ORDER — VALSARTAN 80 MG PO TABS: 160.0000 mg | ORAL_TABLET | Freq: Every day | ORAL | 0 refills | Status: DC

## 2022-12-03 MED ORDER — VORTIOXETINE HBR 10 MG PO TABS
ORAL_TABLET | ORAL | 0 refills | Status: DC
Start: 2022-12-03 — End: 2022-12-03

## 2022-12-03 MED ORDER — AMLODIPINE BESYLATE 10 MG PO TABS
10.0000 mg | ORAL_TABLET | Freq: Every day | ORAL | 0 refills | Status: DC
Start: 2022-12-03 — End: 2022-12-03

## 2022-12-03 MED ORDER — FAMOTIDINE 20 MG PO TABS
20.0000 mg | ORAL_TABLET | Freq: Two times a day (BID) | ORAL | 2 refills | Status: DC
Start: 2022-12-03 — End: 2023-03-14

## 2022-12-03 MED ORDER — CLOPIDOGREL BISULFATE 75 MG PO TABS: 75.0000 mg | ORAL_TABLET | Freq: Every day | ORAL | 11 refills | Status: DC

## 2022-12-03 MED ORDER — REPATHA SURECLICK 140 MG/ML ~~LOC~~ SOAJ: 1.0000 mL | SUBCUTANEOUS | 11 refills | Status: AC

## 2022-12-03 MED ORDER — D3-50 1.25 MG (50000 UT) PO CAPS
ORAL_CAPSULE | ORAL | 0 refills | Status: DC
Start: 2022-12-03 — End: 2023-04-24

## 2022-12-03 MED ORDER — VORTIOXETINE HBR 10 MG PO TABS
ORAL_TABLET | ORAL | 0 refills | Status: DC
Start: 2022-12-03 — End: 2023-03-14

## 2022-12-03 MED ORDER — AMLODIPINE BESYLATE 10 MG PO TABS
10.0000 mg | ORAL_TABLET | Freq: Every day | ORAL | 0 refills | Status: DC
Start: 2022-12-03 — End: 2023-04-22

## 2022-12-03 MED ORDER — D3-50 1.25 MG (50000 UT) PO CAPS
ORAL_CAPSULE | ORAL | 0 refills | Status: DC
Start: 2022-12-03 — End: 2022-12-03

## 2022-12-03 MED ORDER — FUROSEMIDE 20 MG PO TABS: 20.0000 mg | ORAL_TABLET | Freq: Every day | ORAL | 1 refills | Status: AC | PRN

## 2022-12-03 NOTE — Telephone Encounter (Signed)
done

## 2023-03-04 ENCOUNTER — Telehealth: Payer: Self-pay

## 2023-03-04 NOTE — Telephone Encounter (Signed)
Copied from CRM [id number]. Topic: Clinical - Medication Question >> Mar 04, 2023 10:21 AM [name] wrote: Reason for CRM: Patient would like to know whether they need to fast for their next appointment on 12/20.

## 2023-03-04 NOTE — Telephone Encounter (Signed)
Called patient left voicemail, that she would need to fast for her appointment.

## 2023-03-14 ENCOUNTER — Ambulatory Visit (INDEPENDENT_AMBULATORY_CARE_PROVIDER_SITE_OTHER): Payer: Medicare HMO

## 2023-03-14 VITALS — BP 110/64 | HR 75 | Temp 98.0°F | Resp 18 | Ht 60.0 in | Wt 148.6 lb

## 2023-03-14 DIAGNOSIS — N1831 Chronic kidney disease, stage 3a: Secondary | ICD-10-CM

## 2023-03-14 DIAGNOSIS — K219 Gastro-esophageal reflux disease without esophagitis: Secondary | ICD-10-CM | POA: Diagnosis not present

## 2023-03-14 DIAGNOSIS — I10 Essential (primary) hypertension: Secondary | ICD-10-CM

## 2023-03-14 DIAGNOSIS — F331 Major depressive disorder, recurrent, moderate: Secondary | ICD-10-CM

## 2023-03-14 DIAGNOSIS — E782 Mixed hyperlipidemia: Secondary | ICD-10-CM | POA: Diagnosis not present

## 2023-03-14 DIAGNOSIS — R7989 Other specified abnormal findings of blood chemistry: Secondary | ICD-10-CM | POA: Diagnosis not present

## 2023-03-14 DIAGNOSIS — R5382 Chronic fatigue, unspecified: Secondary | ICD-10-CM

## 2023-03-14 MED ORDER — FAMOTIDINE 20 MG PO TABS
20.0000 mg | ORAL_TABLET | Freq: Two times a day (BID) | ORAL | 1 refills | Status: DC
Start: 2023-03-14 — End: 2023-05-27

## 2023-03-14 MED ORDER — CLOPIDOGREL BISULFATE 75 MG PO TABS: 75.0000 mg | ORAL_TABLET | Freq: Every day | ORAL | 1 refills | Status: DC

## 2023-03-14 MED ORDER — VORTIOXETINE HBR 10 MG PO TABS
ORAL_TABLET | ORAL | 1 refills | Status: DC
Start: 2023-03-14 — End: 2023-05-27

## 2023-03-14 NOTE — Assessment & Plan Note (Signed)
Started back on Repatha.  Daughter requests to have lipids done today  Ordered lipid panel

## 2023-03-14 NOTE — Assessment & Plan Note (Signed)
Refilled pepcid Denies any active symptoms at this time

## 2023-03-14 NOTE — Assessment & Plan Note (Addendum)
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 Well-managed on current medication regimen. Discussed the importance of maintaining blood pressure within target range to protect kidney function. - Continue current medication regimen - Monitor kidney function regularly   General Health Maintenance Routine health maintenance and screenings discussed. Emphasized the importance of balanced diet and regular meals, especially given reliance on family and Meals on Wheels for nutrition. - Order metabolic profile - Ensure adequate nutrition and hydration - Encourage balanced diet and regular meals  Follow-up - Follow up with neurologist as scheduled - Proceed to lab for blood work - Refill Trintellix, valsartan, Pepcid, and Plavix.

## 2023-03-14 NOTE — Assessment & Plan Note (Signed)
On Trintellix 10 mg daily at bedtime. Denies any depression at this time. Daughter requests to have coupons or manufacturers' card for the medicine which is provided., Refill sent to her pharmacy

## 2023-03-14 NOTE — Progress Notes (Signed)
Subjective:  Patient ID: [name], female    DOB: April 03, 1940  Age: 82 y.o. MRN: [id number]  Chief Complaint  Patient presents with   Medical Management of Chronic Issues    HPI   Discussed the use of AI scribe software for clinical note transcription with the patient, who gave verbal consent to proceed.  History of Present Illness          The patient, with a history of stroke, hyperlipidemia, and anemia, presents for a follow-up visit. She is accompanied by her daughter who is also the primary caregiver and historian, though [name] herself answers questions appropriately. She reports no improvement in the right-sided weakness since the last visit and physical therapy sessions. The patient has been experiencing chronic fatigue, which could be attributed to her history of anemia, B12 deficiency, and major hospitalization.  The patient has been taking Repatha for hyperlipidemia, but blood work from the last visit showed high total cholesterol, LDL, and triglycerides. The patient has resumed taking Repatha and is due for a fasting lipid panel.  The patient also has a history of B12 deficiency, but supplementation was stopped due to high B12 levels in the blood. The patient's diet is not well-balanced, raising concerns about potential B12 deficiency.  The patient lives alone and manages daily activities with assistance from family members and Meals on Wheels. She uses a walker for mobility and has a Engineer, manufacturing systems for convenience. The patient sleeps on a couch, which she finds comfortable.  The patient's blood pressure has been low, which could be due to her medication regimen of amlodipine and valsartan. The patient's caregiver has been adjusting the medication doses based on home blood pressure readings. The patient's ankles show some swelling, which could be a side effect of the amlodipine.  The patient's husband is in a nursing home due to multiple brain bleeds, which has been a source  of stress for the patient. The patient's caregiver and daughter assist with medication administration and meal preparation.     11/15/2022   10:56 AM 04/04/2022   11:51 AM 11/07/2021    2:10 PM 02/06/2021    2:15 PM 12/18/2020   11:01 AM  Depression screen PHQ 2/9  Decreased Interest 0 2 0 1 3  Down, Depressed, Hopeless 0 0 0 3 0  PHQ - 2 Score 0 2 0 4 3  Altered sleeping 2 1 0 3 3  Tired, decreased energy 3 2 0 3 3  Change in appetite 0 1 0 0 2  Feeling bad or failure about yourself  0 1 0 0 0  Trouble concentrating 1 2 0 0 0  Moving slowly or fidgety/restless 0 1 0 2 0  Suicidal thoughts 0 0 0 0 0  PHQ-9 Score 6 10 0 12 11  Difficult doing work/chores Not difficult at all Somewhat difficult Not difficult at all Not difficult at all Somewhat difficult        11/15/2022   10:59 AM  Fall Risk   Falls in the past year? 0  Number falls in past yr: 0  Injury with Fall? 0  Risk for fall due to : Impaired balance/gait  Follow up Falls evaluation completed    Patient Care Team: Windell Moment, MD as PCP - General (Family Medicine) Wendall Stade, MD as PCP - Cardiology (Cardiology) Wendall Stade, MD as Consulting Physician (Cardiology) Zettie Pho, [facility] (Inactive) (Pharmacist)   Review of Systems  Constitutional:  Positive for fatigue.  HENT: Negative.    Eyes: Negative.   Respiratory: Negative.    Gastrointestinal: Negative.   Genitourinary: Negative.   Neurological:  Positive for weakness.  Psychiatric/Behavioral: Negative.      Current Outpatient Medications on File Prior to Visit  Medication Sig Dispense Refill   Acetaminophen (TYLENOL) 325 MG CAPS Take 650 mg by mouth every 6 (six) hours as needed (pain).     amLODipine (NORVASC) 10 MG tablet Take 1 tablet (10 mg total) by mouth daily. 90 tablet 0   Cholecalciferol (D3-50) 1.25 MG (50000 UT) capsule TAKE 1 CAPSULE BY MOUTH ONE TIME PER WEEK Strength: 1.25 MG (50000 UT) 12 capsule 0   Evolocumab (REPATHA  SURECLICK) 140 MG/ML SOAJ Inject 140 mg into the skin every 14 (fourteen) days. 2 mL 11   furosemide (LASIX) 20 MG tablet Take 1 tablet (20 mg total) by mouth daily as needed for edema. 90 tablet 1   latanoprost (XALATAN) 0.005 % ophthalmic solution Place 1 drop into both eyes at bedtime.     loratadine (CLARITIN) 10 MG tablet Take 10 mg by mouth daily as needed for allergies.     OVER THE COUNTER MEDICATION Apply 1 application. topically daily as needed (knee pain). Hempvana pain relief cream     valsartan (DIOVAN) 80 MG tablet Take 2 tablets (160 mg total) by mouth daily. If systolic BP greater than 140 mmHG give 160 mg by mouth daily, if systolic BP between 120-140 mmHG give 80 mg by mouth daily 180 tablet 0   vitamin B-12 (CYANOCOBALAMIN) 1000 MCG tablet Take 1,000 mcg by mouth every evening. (Patient not taking: Reported on 03/14/2023)     No current facility-administered medications on file prior to visit.   Past Medical History:  Diagnosis Date   Acute embolic stroke (HCC)    Benign paroxysmal vertigo, unspecified ear 06/07/2019   CKD (chronic kidney disease), stage III (HCC)    GERD (gastroesophageal reflux disease)    Glaucoma    Incisional infection 12/25/2020   Past Surgical History:  Procedure Laterality Date   ABDOMINAL HYSTERECTOMY     BACK SURGERY     cataract surgery     COLONOSCOPY N/A 08/03/2015   Procedure: COLONOSCOPY;  Surgeon: Carman Ching, MD;  Location: [facility] ENDOSCOPY;  Service: Endoscopy;  Laterality: N/A;   ESOPHAGOGASTRODUODENOSCOPY N/A 07/31/2015   Procedure: ESOPHAGOGASTRODUODENOSCOPY (EGD);  Surgeon: Carman Ching, MD;  Location: [facility] ENDOSCOPY;  Service: Endoscopy;  Laterality: N/A;   IR ANGIO INTRA EXTRACRAN SEL COM CAROTID INNOMINATE BILAT MOD SED  06/01/2021   IR ANGIO VERTEBRAL SEL SUBCLAVIAN INNOMINATE UNI L MOD SED  06/01/2021   IR RADIOLOGIST EVAL & MGMT  04/26/2021   OOPHORECTOMY     TOTAL HIP ARTHROPLASTY     URETER SURGERY      Family History   Problem Relation Age of Onset   Hypertension Mother    Hypertension Father    Heart attack Father    Hypertension Brother    Diabetes Brother    Social History   Socioeconomic History   Marital status: Married    Spouse name: Not on file   Number of children: Not on file   Years of education: Not on file   Highest education level: Not on file  Occupational History   Not on file  Tobacco Use   Smoking status: Never   Smokeless tobacco: Never  Vaping Use   Vaping status: Never Used  Substance and Sexual Activity   Alcohol use: No  Drug use: No   Sexual activity: Not Currently  Other Topics Concern   Not on file  Social History Narrative   Not on file   Social Drivers of Health   Financial Resource Strain: Low Risk  (11/06/2021)   Overall Financial Resource Strain (CARDIA)    Difficulty of Paying Living Expenses: Not hard at all  Food Insecurity: No Food Insecurity (11/06/2021)   Hunger Vital Sign    Worried About Running Out of Food in the Last Year: Never true    Ran Out of Food in the Last Year: Never true  Transportation Needs: No Transportation Needs (11/06/2021)   PRAPARE - Administrator, Civil Service (Medical): No    Lack of Transportation (Non-Medical): No  Physical Activity: Insufficiently Active (07/12/2021)   Exercise Vital Sign    Days of Exercise per Week: 2 days    Minutes of Exercise per Session: 30 min  Stress: No Stress Concern Present (10/28/2019)   Received from [facility], [facility] Occupational Health - Occupational Stress Questionnaire    Feeling of Stress : Only a little  Social Connections: Moderately Integrated (11/06/2021)   Social Connection and Isolation Panel [NHANES]    Frequency of Communication with Friends and Family: Not on file    Frequency of Social Gatherings with Friends and Family: More than three times a week    Attends Religious Services: Never    Database administrator or  Organizations: Yes    Attends Banker Meetings: Never    Marital Status: Married    Objective:  BP 110/64 (BP Location: Left Arm, Patient Position: Sitting, Cuff Size: Large)   Pulse 75   Temp 98 F (36.7 C) (Temporal)   Resp 18   Ht 5' (1.524 m)   Wt 148 lb 9.6 oz (67.4 kg)   SpO2 97%   BMI 29.02 kg/m      03/14/2023   10:42 AM 11/15/2022   10:44 AM 10/28/2022   10:23 AM  BP/Weight  Systolic BP 110 128 150  Diastolic BP 64 82 68  Wt. (Lbs) 148.6 155 154  BMI 29.02 kg/m2 30.27 kg/m2 30.08 kg/m2    Physical Exam Vitals and nursing note reviewed.  Constitutional:      Appearance: Normal appearance.     Comments: Sitting in her wheelchair  HENT:     Head: Normocephalic and atraumatic.     Mouth/Throat:     Mouth: Mucous membranes are moist.  Cardiovascular:     Rate and Rhythm: Normal rate.     Heart sounds: Murmur (loud ESM) heard.  Pulmonary:     Effort: Pulmonary effort is normal.     Breath sounds: Rales present.  Abdominal:     General: Bowel sounds are normal.     Tenderness: There is no abdominal tenderness.  Musculoskeletal:        General: Normal range of motion.  Skin:    General: Skin is warm.  Neurological:     General: No focal deficit present.     Mental Status: She is alert.  Psychiatric:        Mood and Affect: Mood normal.     Diabetic Foot Exam - Simple   No data filed      Lab Results  Component Value Date   WBC 5.4 11/15/2022   HGB 12.1 11/15/2022   HCT 38.1 11/15/2022   PLT 299 11/15/2022   GLUCOSE 99  11/15/2022   CHOL 250 (H) 11/15/2022   TRIG 195 (H) 11/15/2022   HDL 41 11/15/2022   LDLCALC 173 (H) 11/15/2022   ALT 12 11/15/2022   AST 12 11/15/2022   NA 143 11/15/2022   K 4.7 11/15/2022   CL 109 (H) 11/15/2022   CREATININE 1.00 11/15/2022   BUN 23 11/15/2022   CO2 21 11/15/2022   TSH 1.920 11/15/2022   INR 1.1 08/24/2022   HGBA1C 5.0 07/05/2021      Assessment & Plan:    Elevated vitamin B12  level Assessment & Plan: Had B12 deficiency, has been on supplementation, but then last B12 levels came back pretty elevated. So was recommended to hold off on the B12 supplements. Has not taken them for about 3 months Feels fatigued.  Plan: repeat b12 level today and make recommendation.  Orders: -     Vitamin B12  Mixed hyperlipidemia Assessment & Plan: Started back on Repatha.  Daughter requests to have lipids done today  Ordered lipid panel  Orders: -     CMP14+EGFR -     Lipid panel  Chronic fatigue -     CMP14+EGFR -     CBC with Differential/Platelet -     Vitamin B12  Moderate recurrent major depression (HCC) Assessment & Plan: On Trintellix 10 mg daily at bedtime. Denies any depression at this time. Daughter requests to have coupons or manufacturers' card for the medicine which is provided., Refill sent to her pharmacy  Orders: -     Vortioxetine HBr; TAKE ONE TABLET BY MOUTH EVERYDAY AT BEDTIME  Dispense: 90 tablet; Refill: 1  Gastroesophageal reflux disease, unspecified whether esophagitis present Assessment & Plan: Refilled pepcid Denies any active symptoms at this time  Orders: -     Famotidine; Take 1 tablet (20 mg total) by mouth 2 (two) times daily.  Dispense: 180 tablet; Refill: 1  Essential (primary) hypertension Assessment & Plan: Blood pressure today was 110/64 mmHg, lower than the target range of 120-140 mmHg systolic. Currently on valsartan which daughter either gives 80 mg or 160 mg based on the BP readings and amlodipine 10 mg daily.  Experiences occasional dizziness, possibly due to low blood pressure. Discussed the mechanisms of action of both medications and the potential need to adjust dosages. - Reduce amlodipine to 5 mg daily if low home Blood pressures.  - Monitor blood pressure at home - Adjust medication based on home blood pressure readings   Chronic kidney disease, stage 3a (HCC) Assessment & Plan: Improved from stage 4 to  stage 3 based on recent blood work. Well-managed on current medication regimen. Discussed the importance of maintaining blood pressure within target range to protect kidney function. - Continue current medication regimen - Monitor kidney function regularly   General Health Maintenance Routine health maintenance and screenings discussed. Emphasized the importance of balanced diet and regular meals, especially given reliance on family and Meals on Wheels for nutrition. - Order metabolic profile - Ensure adequate nutrition and hydration - Encourage balanced diet and regular meals  Follow-up - Follow up with neurologist as scheduled - Proceed to lab for blood work - Refill Trintellix, valsartan, Pepcid, and Plavix.   Other orders -     Clopidogrel Bisulfate; Take 1 tablet (75 mg total) by mouth daily.  Dispense: 90 tablet; Refill: 1     Meds ordered this encounter  Medications   famotidine (PEPCID) 20 MG tablet    Sig: Take 1 tablet (20 mg total) by mouth  2 (two) times daily.    Dispense:  180 tablet    Refill:  1   clopidogrel (PLAVIX) 75 MG tablet    Sig: Take 1 tablet (75 mg total) by mouth daily.    Dispense:  90 tablet    Refill:  1   vortioxetine HBr (TRINTELLIX) 10 MG TABS tablet    Sig: TAKE ONE TABLET BY MOUTH EVERYDAY AT BEDTIME    Dispense:  90 tablet    Refill:  1    Orders Placed This Encounter  Procedures   CMP14+EGFR   Lipid panel   CBC with Differential/Platelet   Vitamin B12     Follow-up: No follow-ups on file.   I,Angela Taylor,acting as a Neurosurgeon for [employer], MD.,have documented all relevant documentation on the behalf of Windell Moment, MD,as directed by  Windell Moment, MD while in the presence of Windell Moment, MD.   An After Visit Summary was printed and given to the patient.  Windell Moment, MD [facility] [phone]

## 2023-03-14 NOTE — Assessment & Plan Note (Signed)
Had B12 deficiency, has been on supplementation, but then last B12 levels came back pretty elevated. So was recommended to hold off on the B12 supplements. Has not taken them for about 3 months Feels fatigued.  Plan: repeat b12 level today and make recommendation.

## 2023-03-14 NOTE — Assessment & Plan Note (Signed)
Blood pressure today was 110/64 mmHg, lower than the target range of 120-140 mmHg systolic. Currently on valsartan which daughter either gives 80 mg or 160 mg based on the BP readings and amlodipine 10 mg daily.  Experiences occasional dizziness, possibly due to low blood pressure. Discussed the mechanisms of action of both medications and the potential need to adjust dosages. - Reduce amlodipine to 5 mg daily if low home Blood pressures.  - Monitor blood pressure at home - Adjust medication based on home blood pressure readings

## 2023-03-15 LAB — LIPID PANEL
Chol/HDL Ratio: 5.1 {ratio} — ABNORMAL HIGH (ref 0.0–4.4)
Cholesterol, Total: 184 mg/dL (ref 100–199)
HDL: 36 mg/dL — ABNORMAL LOW (ref >39)
LDL Chol Calc (NIH): 111 mg/dL — ABNORMAL HIGH (ref 0–99)
Triglycerides: 214 mg/dL — ABNORMAL HIGH (ref 0–149)
VLDL Cholesterol Cal: 37 mg/dL (ref 5–40)

## 2023-03-15 LAB — CBC WITH DIFFERENTIAL/PLATELET
Basophils Absolute: 0 10*3/uL (ref 0.0–0.2)
Basos: 1 %
EOS (ABSOLUTE): 0.1 10*3/uL (ref 0.0–0.4)
Eos: 2 %
Hematocrit: 37.9 % (ref 34.0–46.6)
Hemoglobin: 12.2 g/dL (ref 11.1–15.9)
Immature Grans (Abs): 0 10*3/uL (ref 0.0–0.1)
Immature Granulocytes: 0 %
Lymphocytes Absolute: 1 10*3/uL (ref 0.7–3.1)
Lymphs: 17 %
MCH: 30.2 pg (ref 26.6–33.0)
MCHC: 32.2 g/dL (ref 31.5–35.7)
MCV: 94 fL (ref 79–97)
Monocytes Absolute: 0.7 10*3/uL (ref 0.1–0.9)
Monocytes: 12 %
Neutrophils Absolute: 4.1 10*3/uL (ref 1.4–7.0)
Neutrophils: 68 %
Platelets: 292 10*3/uL (ref 150–450)
RBC: 4.04 x10E6/uL (ref 3.77–5.28)
RDW: 12.3 % (ref 11.7–15.4)
WBC: 6 10*3/uL (ref 3.4–10.8)

## 2023-03-15 LAB — CMP14+EGFR
ALT: 14 [IU]/L (ref 0–32)
AST: 13 [IU]/L (ref 0–40)
Albumin: 3.9 g/dL (ref 3.7–4.7)
Alkaline Phosphatase: 141 [IU]/L — ABNORMAL HIGH (ref 44–121)
BUN/Creatinine Ratio: 23 (ref 12–28)
BUN: 26 mg/dL (ref 8–27)
Bilirubin Total: 0.4 mg/dL (ref 0.0–1.2)
CO2: 22 mmol/L (ref 20–29)
Calcium: 10.3 mg/dL (ref 8.7–10.3)
Chloride: 105 mmol/L (ref 96–106)
Creatinine, Ser: 1.12 mg/dL — ABNORMAL HIGH (ref 0.57–1.00)
Globulin, Total: 2.8 g/dL (ref 1.5–4.5)
Glucose: 98 mg/dL (ref 70–99)
Potassium: 4.4 mmol/L (ref 3.5–5.2)
Sodium: 142 mmol/L (ref 134–144)
Total Protein: 6.7 g/dL (ref 6.0–8.5)
eGFR: 49 mL/min/{1.73_m2} — ABNORMAL LOW (ref >59)

## 2023-03-15 LAB — VITAMIN B12: Vitamin B-12: 631 pg/mL (ref 232–1245)

## 2023-03-17 NOTE — Progress Notes (Deleted)
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    Secondary stroke prevention as managed by PCP and cardiology: Plavix 75mg  daily Repatha (statin intolerance).  LDL goal less than 70 Normotensive blood pressure Hgb A1c goal less than 70 Regarding cerebral aneurysms, will have her follow up with endovascular neuro-radiology Use walker, daily exercises for bilateral leg and core strength Follow up ***  Total time spent reviewing chart and face to face with patient, her daughter and granddaughter discussing results and plan:  ***   Subjective:  [name] is an 82 year old right-handed female with CHF, HTN, HLD, CKD and history of CVA who follows up for stroke and cerebral aneurysm.  History supplemented by her daughter.  UPDATE: Current medications:  ***  Carotid ultrasound on 11/11/2022 with 1-39% stenosis of ICAs bilaterally. Lipid panel from 03/14/2023 demonstrated total chol 184, TG 214, HDL 36 and LDL 111.  HISTORY: In September 2022, she started experiencing heaviness in her legs causing her to fall.  No associated dizziness or loss of consciousness.  Legs just give out.  She has pain in her legs but no numbness.  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  She previously used a cane but had to resort to a walker.  She also noticed some weakness in the right arm as well.  Venlafaxine was changed to Trintellix which helped but symptoms continued.  Daughter is concerned that she may be on too much antihypertensive medications.  CT head performed on 03/14/2021 showed chronic small vessel ischemic changes in the cerebral white  matter with remote left cerebellar infarct but no acute intracranial abnormalities.  LDL was 71.  Serum glucose was 111.  TSH was 2.850.  B12 was low at 165 and she was started on monthly injections.  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  She was referred to interventional neuroradiology where she underwent cerebral angiogram on 06/01/2021 who favored continued monitoring due to high risk.  Following the angiogram, she developed thigh hematoma and right leg weakness.  She went to the ED on 06/04/2021 where DVT was ruled out and was advised by IR to continue Plavix.  On 06/07/2021, she developed weakness of right hand.  PCP ordered MRV of head performed on 06/14/2021 which showed abnormal areas of enhancement suspicious for subacute infarcts in the left occipital and parietal lobes.  On 07/04/2021, she developed headache, aphasia, slurred speech and right facial droop so she returned to the ED on 07/04/2021 where MRI of brian again showed patchy left hemispheric infarcts and right PCA territory infarcts.  MRA head and neck again showed diffuse atheromatous disease with stenoses and cerebral aneurysms.  TEE showed LVEF 70-75% with grade I diastolic dysfunction but no cardiac source of emboli.  LDL was 78.  Hgb A1c was 5.  She was on Plavix prior to admission.  She was discharged on ASA 81mg  daily and  Brilinta 90mg  twice daily for 3 months followed by Brilinta alone.  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  She also was found to have bilateral ICA stenosis.  Carotid ultrasound from 06/20/2020 showed 40-59% left ICA stenosis and 1-39% right ICA stenosis.  PAST MEDICAL HISTORY: Past Medical History:  Diagnosis Date   Acute embolic stroke (HCC)    Benign paroxysmal vertigo, unspecified ear 06/07/2019   CKD (chronic kidney disease), stage III (HCC)    GERD (gastroesophageal reflux disease)    Glaucoma    Incisional infection 12/25/2020    MEDICATIONS: Current Outpatient Medications on File Prior to Visit  Medication Sig Dispense Refill   Acetaminophen (TYLENOL) 325 MG CAPS Take 650 mg by mouth every 6 (six) hours as needed (pain).     amLODipine (NORVASC) 10 MG tablet Take 1 tablet (10 mg total) by mouth daily. 90 tablet 0   Cholecalciferol (D3-50) 1.25 MG (50000 UT) capsule TAKE 1 CAPSULE BY MOUTH ONE TIME PER WEEK Strength: 1.25 MG (50000 UT) 12 capsule 0   clopidogrel (PLAVIX) 75 MG tablet Take 1 tablet (75 mg total) by  mouth daily. 90 tablet 1   Evolocumab (REPATHA SURECLICK) 140 MG/ML SOAJ Inject 140 mg into the skin every 14 (fourteen) days. 2 mL 11   famotidine (PEPCID) 20 MG tablet Take 1 tablet (20 mg total) by mouth 2 (two) times daily. 180 tablet 1   furosemide (LASIX) 20 MG tablet Take 1 tablet (20 mg total) by mouth daily as needed for edema. 90 tablet 1   latanoprost (XALATAN) 0.005 % ophthalmic solution Place 1 drop into both eyes at bedtime.     loratadine (CLARITIN) 10 MG tablet Take 10 mg by mouth daily as needed for allergies.     OVER THE COUNTER MEDICATION Apply 1 application. topically daily as needed (knee pain). Hempvana pain relief cream     valsartan (DIOVAN) 80 MG tablet Take 2 tablets (160 mg total) by mouth daily. If systolic BP greater than 140 mmHG give 160 mg by mouth daily, if systolic BP between 120-140 mmHG give 80 mg by mouth daily 180 tablet 0   vitamin B-12 (CYANOCOBALAMIN) 1000 MCG tablet Take 1,000 mcg by mouth every evening. (Patient not taking: Reported on 03/14/2023)     vortioxetine HBr (TRINTELLIX) 10 MG TABS tablet TAKE ONE TABLET BY MOUTH EVERYDAY AT BEDTIME 90 tablet 1   No current facility-administered medications on file prior to  visit.    ALLERGIES: Allergies  Allergen Reactions   Codeine Nausea And Vomiting   Gabapentin     Dizziness, couldn't function    Meclizine Other (See Comments)    Lethargy, extreme sleepiness   Requip [Ropinirole Hcl] Nausea And Vomiting   Statins     Muscle weakness    FAMILY HISTORY: Family History  Problem Relation Age of Onset   Hypertension Mother    Hypertension Father    Heart attack Father    Hypertension Brother    Diabetes Brother       Objective:  *** General: Depressed.  Patient appears well-groomed.   Head:  Normocephalic/atraumatic Eyes:  Fundi examined but not visualized Neck: supple, no paraspinal tenderness, full range of motion Heart:  Regular rate and rhythm Neurological Exam: Alert and oriented.   Speech fluent and not dysarthric.  Language intact.  Right ptosis.  Otherwise, CN II-XII intact.  Muscle strength 5-/5 upper extremities, 4+/5 right hip flexion and 5-/5 left hip flexion.  DTR 2+ throughout.  Finger to nose without dysmetria.  Broad-based gait.  Cannot safely ambulate without walker.  Romberg with sway.   ***   Shon Millet, DO  CC: Windell Moment, MD

## 2023-03-18 ENCOUNTER — Ambulatory Visit: Payer: Medicare HMO | Admitting: Neurology

## 2023-04-22 ENCOUNTER — Other Ambulatory Visit: Payer: Self-pay

## 2023-04-22 DIAGNOSIS — I10 Essential (primary) hypertension: Secondary | ICD-10-CM

## 2023-04-23 ENCOUNTER — Telehealth: Payer: Self-pay

## 2023-04-23 NOTE — Telephone Encounter (Signed)
Copied from CRM [id number]. Topic: Clinical - Prescription Issue >> Apr 23, 2023  2:27 PM Victorino Dike T wrote: Reason for CRM: vortioxetine HBr (TRINTELLIX) 10 MG TABS tablet copay is too high for patient to afford, unable to get assistance with the cost,  may need to change medication back to Lexapro please call daughter [phone]

## 2023-04-24 ENCOUNTER — Other Ambulatory Visit: Payer: Self-pay

## 2023-04-24 DIAGNOSIS — Z008 Encounter for other general examination: Secondary | ICD-10-CM | POA: Diagnosis not present

## 2023-04-24 DIAGNOSIS — E559 Vitamin D deficiency, unspecified: Secondary | ICD-10-CM

## 2023-04-24 MED ORDER — AMLODIPINE BESYLATE 5 MG PO TABS: 5.0000 mg | ORAL_TABLET | Freq: Every day | ORAL | 2 refills | Status: DC

## 2023-04-24 MED ORDER — ESCITALOPRAM OXALATE 10 MG PO TABS: 10.0000 mg | ORAL_TABLET | Freq: Every day | ORAL | 2 refills | Status: DC

## 2023-04-24 NOTE — Telephone Encounter (Signed)
Refill sent to pharmacy.

## 2023-05-26 NOTE — Progress Notes (Unsigned)
 Subjective:   [name] is a 83 y.o. female who presents for Medicare Annual (Subsequent) preventive examination.  Visit Complete: In person  Patient Medicare AWV questionnaire was completed by the patient; I have confirmed that all information answered by patient is correct and no changes since this date.  Cardiac Risk Factors include: none  [name] is an 83 year old female who presents for a Medicare wellness visit. She is accompanied by her daughter, who is her primary caregiver.  Her initial blood pressure was elevated but decreased upon retesting. She is currently on 5 mg of amlodipine daily, and there is concern about increasing the dose due to potential fall risk.  She experienced a fall on February 4th while going out to eat, resulting in hip pain, particularly in the left hip. She did not hit her head during the fall. The pain has been improving over the past two weeks. She takes Tylenol 325 mg twice daily, sometimes every six hours, which seems to help with the pain and lowers her systolic blood pressure.  She requires assistance with activities such as dressing and bathing but can feed herself. She does not cook or clean and no longer manages her finances. She lives alone with her dog, but her daughter and granddaughter assist with daily activities and care for the dog. She uses a walker at home and a wheelchair for outings due to hip pain.  She has a history of strokes and three aneurysms. She was previously on Trintellix but discontinued it due to cost and is now on Lexapro, which is working well for her. She is also on Plavix indefinitely. She takes 6000 IU of vitamin D weekly.  She has not had a recent eye exam and is due for one. 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 She has a history of high memory scores, but there is a noted decline, possibly related to her strokes.  She received a flu shot this season, and there is a discussion about  her pneumonia and shingles vaccines, which need to be administered at a pharmacy. Her last blood work was on December 20th, showing stable kidney and liver function, normal cholesterol, and normalized vitamin B12 levels.    Objective:    Today's Vitals   05/27/23 1103 05/27/23 1123  BP: (!) 152/82 (!) 140/62  Temp: 97.6 F (36.4 C)   Weight: 148 lb (67.1 kg)   Height: 5' (1.524 m)    Body mass index is 28.9 kg/m.     05/27/2023   11:14 AM 09/10/2022   10:42 AM 03/11/2022   10:01 AM 01/14/2022    5:51 PM 07/16/2021    1:24 PM 07/05/2021    9:00 PM 06/01/2021    8:32 AM  Advanced Directives  Does Patient Have a Medical Advance Directive? No No No No No No Yes  Type of Advance Directive       Healthcare Power of Attorney  Does patient want to make changes to medical advance directive?       No - Patient declined  Would patient like information on creating a medical advance directive?      No - Patient declined     Current Medications (verified) Outpatient Encounter Medications as of 05/27/2023  Medication Sig   Acetaminophen (TYLENOL) 325 MG CAPS Take 650 mg by mouth every 6 (six) hours as needed (pain).   amLODipine (NORVASC) 5 MG tablet Take 1 tablet (5 mg total) by mouth  daily.   D3-50 1.25 MG (50000 UT) capsule TAKE 1 CAPSULE BY MOUTH ONE TIME PER WEEK STRENGTH: 1.25 MG (50000 UT)   escitalopram (LEXAPRO) 10 MG tablet Take 1 tablet (10 mg total) by mouth daily.   Evolocumab (REPATHA SURECLICK) 140 MG/ML SOAJ Inject 140 mg into the skin every 14 (fourteen) days.   furosemide (LASIX) 20 MG tablet Take 1 tablet (20 mg total) by mouth daily as needed for edema.   latanoprost (XALATAN) 0.005 % ophthalmic solution Place 1 drop into both eyes at bedtime.   loratadine (CLARITIN) 10 MG tablet Take 10 mg by mouth daily as needed for allergies.   OVER THE COUNTER MEDICATION Apply 1 application. topically daily as needed (knee pain). Hempvana pain relief cream   vitamin B-12 (CYANOCOBALAMIN)  1000 MCG tablet Take 1,000 mcg by mouth every evening.   [DISCONTINUED] amLODipine (NORVASC) 10 MG tablet TAKE 1 TABLET BY MOUTH EVERY DAY   [DISCONTINUED] clopidogrel (PLAVIX) 75 MG tablet Take 1 tablet (75 mg total) by mouth daily.   [DISCONTINUED] famotidine (PEPCID) 20 MG tablet Take 1 tablet (20 mg total) by mouth 2 (two) times daily.   [DISCONTINUED] valsartan (DIOVAN) 80 MG tablet Take 2 tablets (160 mg total) by mouth daily. If systolic BP greater than 140 mmHG give 160 mg by mouth daily, if systolic BP between 120-140 mmHG give 80 mg by mouth daily   [DISCONTINUED] vortioxetine HBr (TRINTELLIX) 10 MG TABS tablet TAKE ONE TABLET BY MOUTH EVERYDAY AT BEDTIME   clopidogrel (PLAVIX) 75 MG tablet Take 1 tablet (75 mg total) by mouth daily.   famotidine (PEPCID) 20 MG tablet Take 1 tablet (20 mg total) by mouth 2 (two) times daily.   valsartan (DIOVAN) 80 MG tablet Take 2 tablets (160 mg total) by mouth daily. If systolic BP greater than 140 mmHG give 160 mg by mouth daily, if systolic BP between 120-140 mmHG give 80 mg by mouth daily   No facility-administered encounter medications on file as of 05/27/2023.    Allergies (verified) Codeine, Gabapentin, Meclizine, Requip [ropinirole hcl], and Statins   History: Past Medical History:  Diagnosis Date   Acute embolic stroke (HCC)    Benign paroxysmal vertigo, unspecified ear 06/07/2019   CKD (chronic kidney disease), stage III (HCC)    GERD (gastroesophageal reflux disease)    Glaucoma    Incisional infection 12/25/2020   Past Surgical History:  Procedure Laterality Date   ABDOMINAL HYSTERECTOMY     BACK SURGERY     cataract surgery     COLONOSCOPY N/A 08/03/2015   Procedure: COLONOSCOPY;  Surgeon: Carman Ching, MD;  Location[facility] ENDOSCOPY;  Service: Endoscopy;  Laterality: N/A;   ESOPHAGOGASTRODUODENOSCOPY N/A 07/31/2015   Procedure: ESOPHAGOGASTRODUODENOSCOPY (EGD);  Surgeon: Carman Ching, MD;  Location: [facility] ENDOSCOPY;  Service:  Endoscopy;  Laterality: N/A;   IR ANGIO INTRA EXTRACRAN SEL COM CAROTID INNOMINATE BILAT MOD SED  06/01/2021   IR ANGIO VERTEBRAL SEL SUBCLAVIAN INNOMINATE UNI L MOD SED  06/01/2021   IR RADIOLOGIST EVAL & MGMT  04/26/2021   OOPHORECTOMY     TOTAL HIP ARTHROPLASTY     URETER SURGERY     Family History  Problem Relation Age of Onset   Hypertension Mother    Hypertension Father    Heart attack Father    Hypertension Brother    Diabetes Brother    Social History   Socioeconomic History   Marital status: Married    Spouse name: Not on file   Number  of children: Not on file   Years of education: Not on file   Highest education level: Not on file  Occupational History   Not on file  Tobacco Use   Smoking status: Never   Smokeless tobacco: Never  Vaping Use   Vaping status: Never Used  Substance and Sexual Activity   Alcohol use: No   Drug use: No   Sexual activity: Not Currently  Other Topics Concern   Not on file  Social History Narrative   Not on file   Social Drivers of Health   Financial Resource Strain: Low Risk  (11/06/2021)   Overall Financial Resource Strain (CARDIA)    Difficulty of Paying Living Expenses: Not hard at all  Food Insecurity: No Food Insecurity (05/27/2023)   Hunger Vital Sign    Worried About Running Out of Food in the Last Year: Never true    Ran Out of Food in the Last Year: Never true  Transportation Needs: No Transportation Needs (05/27/2023)   PRAPARE - Administrator, Civil Service (Medical): No    Lack of Transportation (Non-Medical): No  Physical Activity: Inactive (05/27/2023)   Exercise Vital Sign    Days of Exercise per Week: 0 days    Minutes of Exercise per Session: 0 min  Stress: No Stress Concern Present (05/27/2023)   [employer] of Occupational Health - Occupational Stress Questionnaire    Feeling of Stress : Not at all  Social Connections: Moderately Isolated (05/27/2023)   Social Connection and Isolation Panel  [NHANES]    Frequency of Communication with Friends and Family: More than three times a week    Frequency of Social Gatherings with Friends and Family: More than three times a week    Attends Religious Services: Never    Database administrator or Organizations: Yes    Attends Banker Meetings: Never    Marital Status: Widowed    Tobacco Counseling Counseling given: Not Answered   Clinical Intake:  Pre-visit preparation completed: No  Pain : No/denies pain     BMI - recorded: 28.9 Nutritional Status: BMI 25 -29 Overweight Nutritional Risks: None Diabetes: No  How often do you need to have someone help you when you read instructions, pamphlets, or other written materials from your doctor or pharmacy?: 5 - Always  Interpreter Needed?: No      Activities of Daily Living    05/27/2023   11:15 AM  In your present state of health, do you have any difficulty performing the following activities:  Hearing? 0  Vision? 1  Difficulty concentrating or making decisions? 1  Walking or climbing stairs? 1  Dressing or bathing? 1  Doing errands, shopping? 1  Preparing Food and eating ? Y  Using the Toilet? N  In the past six months, have you accidently leaked urine? Y  Do you have problems with loss of bowel control? Y  Managing your Medications? N  Managing your Finances? N  Housekeeping or managing your Housekeeping? N    Patient Care Team: Windell Moment, MD as PCP - General (Family Medicine) Wendall Stade, MD as PCP - Cardiology (Cardiology) Wendall Stade, MD as Consulting Physician (Cardiology) Zettie Pho, [facility] (Inactive) (Pharmacist)  Indicate any recent Medical Services you may have received from other than [facility] providers in the past year (date may be approximate).     Assessment:   This is a routine wellness examination for Dante.  Hearing/Vision screen No results found.  Goals Addressed   None   Depression Screen    05/27/2023    11:15 AM 05/27/2023   11:14 AM 11/15/2022   10:56 AM 04/04/2022   11:51 AM 11/07/2021    2:10 PM 02/06/2021    2:15 PM 12/18/2020   11:01 AM  PHQ 2/9 Scores  PHQ - 2 Score 0 0 0 2 0 4 3  PHQ- 9 Score   6 10 0 12 11    Fall Risk    05/27/2023   11:14 AM 11/15/2022   10:59 AM 09/10/2022   10:41 AM 03/11/2022   10:01 AM 07/16/2021    1:24 PM  Fall Risk   Falls in the past year? 1 0 1 1 0  Number falls in past yr: 1 0 1 0 0  Injury with Fall? 1 0 1 1 0  Risk for fall due to : History of fall(s) Impaired balance/gait     Follow up  Falls evaluation completed Falls evaluation completed Falls evaluation completed     MEDICARE RISK AT HOME: Medicare Risk at Home Any stairs in or around the home?: Yes If so, are there any without handrails?: Yes Home free of loose throw rugs in walkways, pet beds, electrical cords, etc?: Yes Adequate lighting in your home to reduce risk of falls?: Yes Life alert?: No Use of a cane, walker or w/c?: Yes (walker, wheelchair) Grab bars in the bathroom?: No Shower chair or bench in shower?: Yes Elevated toilet seat or a handicapped toilet?: Yes   Cognitive Function:    06/08/2019   10:11 AM  MMSE - Mini Mental State Exam  Orientation to time 5  Orientation to Place 5  Registration 3  Attention/ Calculation 4  Recall 2  Language- name 2 objects 2  Language- repeat 1  Language- follow 3 step command 3  Language- read & follow direction 1  Write a sentence 1  Copy design 0  Total score 27        05/27/2023   11:16 AM 06/13/2020   10:24 AM  6CIT Screen  What Year? 4 points 0 points  What month? 0 points 0 points  What time? 0 points 0 points  Count back from 20 4 points 0 points  Months in reverse 4 points 2 points  Repeat phrase 0 points 2 points  Total Score 12 points 4 points    Immunizations Immunization History  Administered Date(s) Administered   Fluad Trivalent(High Dose 65+) 05/27/2023   PFIZER(Purple Top)SARS-COV-2 Vaccination  06/10/2019, 07/07/2019   Tdap 12/08/2020, 08/24/2022    TDAP status: Up to date  Flu Vaccine status: Completed at today's visit  Pneumococcal vaccine status: Due, Education has been provided regarding the importance of this vaccine. Advised may receive this vaccine at local pharmacy or Health Dept. Aware to provide a copy of the vaccination record if obtained from local pharmacy or Health Dept. Verbalized acceptance and understanding.  Covid-19 vaccine status: Completed vaccines  Qualifies for Shingles Vaccine? Yes   Zostavax completed No   Shingrix Completed?: No.    Education has been provided regarding the importance of this vaccine. Patient has been advised to call insurance company to determine out of pocket expense if they have not yet received this vaccine. Advised may also receive vaccine at local pharmacy or Health Dept. Verbalized acceptance and understanding.  Screening Tests Health Maintenance  Topic Date Due   Pneumonia Vaccine 58+ Years old (1 of 2 - PCV) Never done   Zoster  Vaccines- Shingrix (1 of 2) Never done   DEXA SCAN  Never done   Medicare Annual Wellness (AWV)  05/26/2024   DTaP/Tdap/Td (3 - Td or Tdap) 08/23/2032   INFLUENZA VACCINE  Completed   HPV VACCINES  Aged Out   COVID-19 Vaccine  Discontinued    Health Maintenance  Health Maintenance Due  Topic Date Due   Pneumonia Vaccine 45+ Years old (1 of 2 - PCV) Never done   Zoster Vaccines- Shingrix (1 of 2) Never done   DEXA SCAN  Never done    Colorectal cancer screening: No longer required.   Mammogram status: No longer required due to age.   Lung Cancer Screening: (Low Dose CT Chest recommended if Age 39-80 years, 20 pack-year currently smoking OR have quit w/in 15years.) does not qualify.   Lung Cancer Screening Referral: not needed  Additional Screening:  Hepatitis C Screening: does qualify; Completed defer due to age  Vision Screening: Recommended annual ophthalmology exams for early  detection of glaucoma and other disorders of the eye. Is the patient up to date with their annual eye exam?  No  Who is the provider or what is the name of the office in which the patient attends annual eye exams? [facility] If pt is not established with a provider, would they like to be referred to a provider to establish care? No .   Dental Screening: Recommended annual dental exams for proper oral hygiene    Community Resource Referral / Chronic Care Management: CRR required this visit?  No  DAUGHTER STATES SHE IS MANAGING WELL AT THIS TIME.   CCM required this visit?  No  Review of Systems  Musculoskeletal:  Positive for myalgias.       Left lateral hip pain  Psychiatric/Behavioral:  Positive for memory loss (memory decline).      Physical Exam Vitals and nursing note reviewed.  Constitutional:      Comments: Sitting in wheelchair  Cardiovascular:     Rate and Rhythm: Normal rate and regular rhythm.     Heart sounds: Murmur heard.  Pulmonary:     Effort: Pulmonary effort is normal.     Breath sounds: Normal breath sounds.  Musculoskeletal:        General: Tenderness (left trochanteric bursa tenderness) present.  Neurological:     Mental Status: She is alert.     Plan:    Encounter for Medicare annual wellness exam Assessment & Plan: Vaccinations and screenings discussed. No need for mammograms or bone density tests due to age. Eye exam is due. Flu and pneumonia vaccines discussed. Flu vaccine to be administered if not already received. Pneumonia and shingles vaccines to be obtained at pharmacy if needed. - Administer flu vaccine if not already received - Check records for pneumonia vaccine and administer if not received - Schedule eye exam at [facility]   Advanced Directives Discussion of advanced directives to ensure wishes are documented. Encouraged to review and discuss with family. - Review and discuss advanced directives with  family     Gastroesophageal reflux disease, unspecified whether esophagitis present -     Famotidine; Take 1 tablet (20 mg total) by mouth 2 (two) times daily.  Dispense: 180 tablet; Refill: 1  Essential (primary) hypertension Assessment & Plan: Initial elevated blood pressure improved on repeat measurement. No changes to antihypertensive regimen due to risk of hypotension and fall risk. Pain from recent fall may have contributed to elevated blood pressure. - Continue amlodipine 5 mg  daily  Orders: -     Valsartan; Take 2 tablets (160 mg total) by mouth daily. If systolic BP greater than 140 mmHG give 160 mg by mouth daily, if systolic BP between 120-140 mmHG give 80 mg by mouth daily  Dispense: 180 tablet; Refill: 0  Encounter for immunization -     Flu Vaccine Trivalent High Dose (Fluad)  Left hip pain Assessment & Plan: Left hip pain following a fall on February 4th, likely trochanteric bursitis due to localized tenderness over the left trochanteric bursa. Pain is improving with Tylenol, heat, and cannabis oil - Apply heat or ice to the affected area - Use Biofreeze or cannabis oil for pain relief - Consider physical therapy exercises if feasible   Other orders -     Clopidogrel Bisulfate; Take 1 tablet (75 mg total) by mouth daily.  Dispense: 90 tablet; Refill: 1    I have personally reviewed and noted the following in the patient's chart:   Medical and social history Use of alcohol, tobacco or illicit drugs  Current medications and supplements including opioid prescriptions. Patient is not currently taking opioid prescriptions. Functional ability and status Nutritional status Physical activity Advanced directives List of other physicians Hospitalizations, surgeries, and ER visits in previous 12 months Vitals Screenings to include cognitive, depression, and falls Referrals and appointments  In addition, I have reviewed and discussed with patient certain preventive  protocols, quality metrics, and best practice recommendations. A written personalized care plan for preventive services as well as general preventive health recommendations were provided to patient.     Windell Moment, MD   05/27/2023   After Visit Summary: (In Person-Printed) AVS printed and given to the patient  Nurse Notes:

## 2023-05-27 ENCOUNTER — Ambulatory Visit: Payer: Medicare HMO

## 2023-05-27 VITALS — BP 140/62 | Temp 97.6°F | Ht 60.0 in | Wt 148.0 lb

## 2023-05-27 DIAGNOSIS — Z23 Encounter for immunization: Secondary | ICD-10-CM | POA: Insufficient documentation

## 2023-05-27 DIAGNOSIS — K219 Gastro-esophageal reflux disease without esophagitis: Secondary | ICD-10-CM | POA: Diagnosis not present

## 2023-05-27 DIAGNOSIS — M25552 Pain in left hip: Secondary | ICD-10-CM | POA: Insufficient documentation

## 2023-05-27 DIAGNOSIS — I10 Essential (primary) hypertension: Secondary | ICD-10-CM

## 2023-05-27 DIAGNOSIS — Z0000 Encounter for general adult medical examination without abnormal findings: Secondary | ICD-10-CM

## 2023-05-27 MED ORDER — FAMOTIDINE 20 MG PO TABS
20.0000 mg | ORAL_TABLET | Freq: Two times a day (BID) | ORAL | 1 refills | Status: DC
Start: 2023-05-27 — End: 2023-11-18

## 2023-05-27 MED ORDER — CLOPIDOGREL BISULFATE 75 MG PO TABS: 75.0000 mg | ORAL_TABLET | Freq: Every day | ORAL | 1 refills | Status: DC

## 2023-05-27 MED ORDER — VALSARTAN 80 MG PO TABS
160.0000 mg | ORAL_TABLET | Freq: Every day | ORAL | 0 refills | Status: AC
Start: 2023-05-27 — End: ?

## 2023-05-27 NOTE — Assessment & Plan Note (Signed)
 Left hip pain following a fall on February 4th, likely trochanteric bursitis due to localized tenderness over the left trochanteric bursa. Pain is improving with Tylenol, heat, and cannabis oil - Apply heat or ice to the affected area - Use Biofreeze or cannabis oil for pain relief - Consider physical therapy exercises if feasible

## 2023-05-27 NOTE — Assessment & Plan Note (Addendum)
 Vaccinations and screenings discussed. No need for mammograms or bone density tests due to age. Eye exam is due. Flu and pneumonia vaccines discussed. Flu vaccine to be administered if not already received. Pneumonia and shingles vaccines to be obtained at pharmacy if needed. - Administer flu vaccine if not already received - Check records for pneumonia vaccine and administer if not received - Schedule eye exam at [facility]   Advanced Directives Discussion of advanced directives to ensure wishes are documented. Encouraged to review and discuss with family. - Review and discuss advanced directives with family

## 2023-05-27 NOTE — Assessment & Plan Note (Signed)
 Initial elevated blood pressure improved on repeat measurement. No changes to antihypertensive regimen due to risk of hypotension and fall risk. Pain from recent fall may have contributed to elevated blood pressure. - Continue amlodipine 5 mg daily

## 2023-06-24 ENCOUNTER — Other Ambulatory Visit: Payer: Self-pay

## 2023-07-14 ENCOUNTER — Encounter (HOSPITAL_COMMUNITY): Payer: Self-pay

## 2023-07-14 ENCOUNTER — Emergency Department (HOSPITAL_COMMUNITY)

## 2023-07-14 ENCOUNTER — Observation Stay (HOSPITAL_COMMUNITY)
Admission: EM | Admit: 2023-07-14 | Discharge: 2023-07-16 | Disposition: A | Discharge status: Home / Self Care | Attending: Internal Medicine | Admitting: Internal Medicine

## 2023-07-14 ENCOUNTER — Other Ambulatory Visit: Payer: Self-pay

## 2023-07-14 DIAGNOSIS — I130 Hypertensive heart and chronic kidney disease with heart failure and stage 1 through stage 4 chronic kidney disease, or unspecified chronic kidney disease: Secondary | ICD-10-CM | POA: Insufficient documentation

## 2023-07-14 DIAGNOSIS — I213 ST elevation (STEMI) myocardial infarction of unspecified site: Secondary | ICD-10-CM | POA: Diagnosis not present

## 2023-07-14 DIAGNOSIS — R0789 Other chest pain: Secondary | ICD-10-CM | POA: Diagnosis not present

## 2023-07-14 DIAGNOSIS — K219 Gastro-esophageal reflux disease without esophagitis: Secondary | ICD-10-CM | POA: Diagnosis not present

## 2023-07-14 DIAGNOSIS — R2689 Other abnormalities of gait and mobility: Secondary | ICD-10-CM | POA: Diagnosis not present

## 2023-07-14 DIAGNOSIS — R079 Chest pain, unspecified: Secondary | ICD-10-CM | POA: Diagnosis not present

## 2023-07-14 DIAGNOSIS — Z96649 Presence of unspecified artificial hip joint: Secondary | ICD-10-CM | POA: Diagnosis not present

## 2023-07-14 DIAGNOSIS — N179 Acute kidney failure, unspecified: Secondary | ICD-10-CM | POA: Insufficient documentation

## 2023-07-14 DIAGNOSIS — Z8673 Personal history of transient ischemic attack (TIA), and cerebral infarction without residual deficits: Secondary | ICD-10-CM | POA: Insufficient documentation

## 2023-07-14 DIAGNOSIS — Z79899 Other long term (current) drug therapy: Secondary | ICD-10-CM | POA: Insufficient documentation

## 2023-07-14 DIAGNOSIS — Z7902 Long term (current) use of antithrombotics/antiplatelets: Secondary | ICD-10-CM | POA: Diagnosis not present

## 2023-07-14 DIAGNOSIS — I509 Heart failure, unspecified: Secondary | ICD-10-CM | POA: Diagnosis not present

## 2023-07-14 DIAGNOSIS — K449 Diaphragmatic hernia without obstruction or gangrene: Secondary | ICD-10-CM | POA: Diagnosis not present

## 2023-07-14 DIAGNOSIS — E785 Hyperlipidemia, unspecified: Secondary | ICD-10-CM | POA: Diagnosis not present

## 2023-07-14 DIAGNOSIS — I4510 Unspecified right bundle-branch block: Secondary | ICD-10-CM | POA: Diagnosis not present

## 2023-07-14 DIAGNOSIS — N183 Chronic kidney disease, stage 3 unspecified: Secondary | ICD-10-CM | POA: Diagnosis not present

## 2023-07-14 DIAGNOSIS — R2681 Unsteadiness on feet: Secondary | ICD-10-CM | POA: Diagnosis not present

## 2023-07-14 DIAGNOSIS — Z23 Encounter for immunization: Secondary | ICD-10-CM | POA: Insufficient documentation

## 2023-07-14 DIAGNOSIS — R918 Other nonspecific abnormal finding of lung field: Secondary | ICD-10-CM | POA: Diagnosis not present

## 2023-07-14 DIAGNOSIS — I700 Atherosclerosis of aorta: Secondary | ICD-10-CM | POA: Diagnosis not present

## 2023-07-14 LAB — CBC WITH DIFFERENTIAL/PLATELET
Abs Immature Granulocytes: 0.02 10*3/uL (ref 0.00–0.07)
Basophils Absolute: 0 10*3/uL (ref 0.0–0.1)
Basophils Relative: 0 %
Eosinophils Absolute: 0.1 10*3/uL (ref 0.0–0.5)
Eosinophils Relative: 1 %
HCT: 38.6 % (ref 36.0–46.0)
Hemoglobin: 12.4 g/dL (ref 12.0–15.0)
Immature Granulocytes: 0 %
Lymphocytes Relative: 16 %
Lymphs Abs: 1.2 10*3/uL (ref 0.7–4.0)
MCH: 30.9 pg (ref 26.0–34.0)
MCHC: 32.1 g/dL (ref 30.0–36.0)
MCV: 96.3 fL (ref 80.0–100.0)
Monocytes Absolute: 0.6 10*3/uL (ref 0.1–1.0)
Monocytes Relative: 8 %
Neutro Abs: 5.4 10*3/uL (ref 1.7–7.7)
Neutrophils Relative %: 75 %
Platelets: 262 10*3/uL (ref 150–400)
RBC: 4.01 MIL/uL (ref 3.87–5.11)
RDW: 13 % (ref 11.5–15.5)
WBC: 7.2 10*3/uL (ref 4.0–10.5)
nRBC: 0 % (ref 0.0–0.2)

## 2023-07-14 LAB — COMPREHENSIVE METABOLIC PANEL WITH GFR
ALT: 10 U/L (ref 0–44)
AST: 20 U/L (ref 15–41)
Albumin: 3.7 g/dL (ref 3.5–5.0)
Alkaline Phosphatase: 94 U/L (ref 38–126)
Anion gap: 12 (ref 5–15)
BUN: 21 mg/dL (ref 8–23)
CO2: 22 mmol/L (ref 22–32)
Calcium: 10.1 mg/dL (ref 8.9–10.3)
Chloride: 107 mmol/L (ref 98–111)
Creatinine, Ser: 1.25 mg/dL — ABNORMAL HIGH (ref 0.44–1.00)
GFR, Estimated: 43 mL/min — ABNORMAL LOW (ref >60)
Glucose, Bld: 152 mg/dL — ABNORMAL HIGH (ref 70–99)
Potassium: 4.1 mmol/L (ref 3.5–5.1)
Sodium: 141 mmol/L (ref 135–145)
Total Bilirubin: 0.7 mg/dL (ref 0.0–1.2)
Total Protein: 6.6 g/dL (ref 6.5–8.1)

## 2023-07-14 LAB — TROPONIN I (HIGH SENSITIVITY): Troponin I (High Sensitivity): 14 ng/L (ref <18)

## 2023-07-14 MED ORDER — ALUM & MAG HYDROXIDE-SIMETH 200-200-20 MG/5ML PO SUSP
30.0000 mL | Freq: Once | ORAL | Status: AC
  Administered 2023-07-14: 30 mL via ORAL
  Filled 2023-07-14: qty 30

## 2023-07-14 MED ORDER — FAMOTIDINE 20 MG PO TABS
40.0000 mg | ORAL_TABLET | Freq: Once | ORAL | Status: AC
  Administered 2023-07-14: 40 mg via ORAL
  Filled 2023-07-14: qty 2

## 2023-07-14 NOTE — Discharge Instructions (Addendum)
 You were seen in the ED today for chest pain.  Your workup resulted with no findings of heart attack at this time.  Please follow-up with your cardiologist and primary care doctor for reevaluation.  Please return to the ED for any emergency medical symptoms, including recurrence of chest pain.

## 2023-07-14 NOTE — ED Provider Notes (Signed)
 [place] EMERGENCY DEPARTMENT AT [facility] Provider Note   CSN: [id number] Arrival date & time: 07/14/23  1954     History  Chief Complaint  Patient presents with   Chest Pain    C/o left sided 10/10 chest pain with EMS and received 324mg  ASA prior to arrival. EMS reports 1 episode of vomiting and then chest pain was relieved. Patient denies any chest pain on arrival to the ED   HPI  [name] is a 83 y.o. female with past medical history GERD, hypertension, LBBB, hyperlipidemia, CVA presents due to chest pain.  At around 1 PM after having beef stew, patient began having substernal chest pain that rated across the whole chest.  She denies any radiation to her arms or jaw.  Does endorse some pain to her back with this.  She did have some episodes of vomiting at home as well.  Her daughter came to check on her and was concerned she was having a heart attack.  Patient's pain resolved about 1 hour prior to being seen, endorses relief after vomiting.  Believes that the pain she was experiencing was indigestion and was similar to pain she had in the past with ingestion.  Patient received aspirin  load with EMS.   Chest Pain      Home Medications Prior to Admission medications   Medication Sig Start Date End Date Taking? Authorizing Provider  Acetaminophen  (TYLENOL ) 325 MG CAPS Take 650 mg by mouth every 6 (six) hours as needed (pain).    [provider]  amLODipine  (NORVASC ) 5 MG tablet Take 1 tablet (5 mg total) by mouth daily. 04/24/23   Sirivol, Mamatha, MD  clopidogrel  (PLAVIX ) 75 MG tablet Take 1 tablet (75 mg total) by mouth daily. 05/27/23   Sirivol, Mamatha, MD  D3-50 1.25 MG (50000 UT) capsule TAKE 1 CAPSULE BY MOUTH ONE TIME PER WEEK STRENGTH: 1.25 MG (50000 UT) 04/24/23   Sirivol, Mamatha, MD  escitalopram  (LEXAPRO ) 10 MG tablet TAKE 1 TABLET BY MOUTH EVERY DAY 06/24/23   Sirivol, Mamatha, MD  Evolocumab  (REPATHA  SURECLICK) 140 MG/ML SOAJ Inject 140 mg into  the skin every 14 (fourteen) days. 12/03/22   Sirivol, Mamatha, MD  famotidine  (PEPCID ) 20 MG tablet Take 1 tablet (20 mg total) by mouth 2 (two) times daily. 05/27/23   Sirivol, Mamatha, MD  furosemide  (LASIX ) 20 MG tablet Take 1 tablet (20 mg total) by mouth daily as needed for edema. 12/03/22   Sirivol, Alla Ar, MD  latanoprost  (XALATAN ) 0.005 % ophthalmic solution Place 1 drop into both eyes at bedtime. 12/09/18   [provider]  loratadine  (CLARITIN ) 10 MG tablet Take 10 mg by mouth daily as needed for allergies.    [provider]  OVER THE COUNTER MEDICATION Apply 1 application. topically daily as needed (knee pain). Hempvana pain relief cream    [provider]  valsartan  (DIOVAN ) 80 MG tablet Take 2 tablets (160 mg total) by mouth daily. If systolic BP greater than 140 mmHG give 160 mg by mouth daily, if systolic BP between 120-140 mmHG give 80 mg by mouth daily 05/27/23   Sirivol, Mamatha, MD  vitamin B-12 (CYANOCOBALAMIN ) 1000 MCG tablet Take 1,000 mcg by mouth every evening.    [provider]      Allergies    Codeine, Gabapentin , Meclizine, Requip [ropinirole hcl], and Statins    Review of Systems   Review of Systems  Cardiovascular:  Positive for chest pain.    Physical  Exam Updated Vital Signs BP (!) 159/73 (BP Location: Right Arm)   Pulse 86   Temp 98.3 F (36.8 C) (Oral)   Resp 20   Ht 5' (1.524 m)   Wt 67.1 kg   SpO2 98%   BMI 28.89 kg/m  Physical Exam Vitals and nursing note reviewed.  Constitutional:      General: She is not in acute distress.    Appearance: She is well-developed.  HENT:     Head: Normocephalic and atraumatic.     Nose: Nose normal.     Mouth/Throat:     Mouth: Mucous membranes are moist.  Eyes:     Conjunctiva/sclera: Conjunctivae normal.  Cardiovascular:     Rate and Rhythm: Normal rate and regular rhythm.     Pulses:          Radial pulses are 2+ on the right side and 2+ on the left side.     Heart  sounds: No murmur heard. Pulmonary:     Effort: Pulmonary effort is normal. No respiratory distress.     Breath sounds: Normal breath sounds.  Abdominal:     General: There is no distension.     Palpations: Abdomen is soft.     Tenderness: There is no abdominal tenderness. There is no guarding or rebound.  Musculoskeletal:        General: No swelling.     Cervical back: Neck supple.     Comments: Trace bilateral lower extremity edema  Skin:    General: Skin is warm and dry.     Capillary Refill: Capillary refill takes less than 2 seconds.  Neurological:     Mental Status: She is alert.  Psychiatric:        Mood and Affect: Mood normal.     ED Results / Procedures / Treatments   Labs (all labs ordered are listed, but only abnormal results are displayed) Labs Reviewed  COMPREHENSIVE METABOLIC PANEL WITH GFR - Abnormal; Notable for the following components:      Result Value   Glucose, Bld 152 (*)    Creatinine, Ser 1.25 (*)    GFR, Estimated 43 (*)    All other components within normal limits  TROPONIN I (HIGH SENSITIVITY) - Abnormal; Notable for the following components:   Troponin I (High Sensitivity) 19 (*)    All other components within normal limits  CBC WITH DIFFERENTIAL/PLATELET  TROPONIN I (HIGH SENSITIVITY)    EKG None  Radiology DG Chest Portable 1 View Result Date: 07/14/2023 CLINICAL DATA:  Chest pain EXAM: PORTABLE CHEST 1 VIEW COMPARISON:  08/24/2022 FINDINGS: Large air-filled hiatal hernia. Probable cardiomegaly. Mild diffuse interstitial opacity appears chronic. No pleural effusion or pneumothorax. Aortic atherosclerosis IMPRESSION: Large air-filled hiatal hernia. Probable cardiomegaly. Mild diffuse interstitial opacity appears chronic. Electronically Signed   By: Esmeralda Hedge M.D.   On: 07/14/2023 23:59    Procedures Procedures    Medications Ordered in ED Medications  irbesartan  (AVAPRO ) tablet 75 mg (has no administration in time range)   furosemide  (LASIX ) tablet 20 mg (has no administration in time range)  alum & mag hydroxide-simeth (MAALOX/MYLANTA) 200-200-20 MG/5ML suspension 30 mL (30 mLs Oral Given 07/14/23 2224)  famotidine  (PEPCID ) tablet 40 mg (40 mg Oral Given 07/14/23 2224)    ED Course/ Medical Decision Making/ A&P             HEART Score: 6  Medical Decision Making Amount and/or Complexity of Data Reviewed Labs: ordered. Radiology: ordered.  Risk OTC drugs. Prescription drug management.   Patient is alert, afebrile, and hemodynamically stable in no acute distress.  Physical exam as noted above.  Differential includes ACS, pneumonia, pneumothorax, GERD, PE, CHF exacerbation, aortic dissection, amongst other diagnoses.  Patient was given Maalox and Pepcid .  I personally interpreted patient's EKG, which demonstrated sinus rhythm with rate of 84, prolonged PR interval, QRS duration, and QTc of 498.  Left bundle branch block appreciated, negative Sgarbossa criteria for MI.  CBC with no anemia or leukocytosis, CMP with BUN 21 and creatinine 1.25 (4 months ago was 1.12).  First troponin 14.  I personally interpreted patient's chest x-ray, which demonstrated no focal consolidations, mild pulmonary edema, similar to prior x-ray.  Large air-filled hiatal hernia noted.  While pending workup, patient had recurrence of chest pain.  Repeat EKG was performed with no acute ischemic changes, subtle changes in QRS complex of septal leads.  She has no abdominal pain on palpation, endorses pain to the right side of the chest that radiates to the left.  Moderate risk HEART score.  Second troponin 19.  Given patient's risk with increase in her troponin, will consult cardiology.  Handoff given to oncoming provider, plan to pend cardiology consult.        Final Clinical Impression(s) / ED Diagnoses Final diagnoses:  Chest pain, unspecified type    Rx / DC Orders ED Discharge Orders     None          Lorain Robson, MD 07/15/23 1610    Diane Arias, MD 07/15/23 1451

## 2023-07-15 ENCOUNTER — Other Ambulatory Visit (HOSPITAL_COMMUNITY)

## 2023-07-15 DIAGNOSIS — N179 Acute kidney failure, unspecified: Secondary | ICD-10-CM | POA: Insufficient documentation

## 2023-07-15 DIAGNOSIS — R079 Chest pain, unspecified: Secondary | ICD-10-CM | POA: Diagnosis not present

## 2023-07-15 LAB — CBC WITH DIFFERENTIAL/PLATELET
Abs Immature Granulocytes: 0.02 10*3/uL (ref 0.00–0.07)
Basophils Absolute: 0 10*3/uL (ref 0.0–0.1)
Basophils Relative: 0 %
Eosinophils Absolute: 0.1 10*3/uL (ref 0.0–0.5)
Eosinophils Relative: 1 %
HCT: 35.8 % — ABNORMAL LOW (ref 36.0–46.0)
Hemoglobin: 11.6 g/dL — ABNORMAL LOW (ref 12.0–15.0)
Immature Granulocytes: 0 %
Lymphocytes Relative: 22 %
Lymphs Abs: 1.5 10*3/uL (ref 0.7–4.0)
MCH: 30.8 pg (ref 26.0–34.0)
MCHC: 32.4 g/dL (ref 30.0–36.0)
MCV: 95 fL (ref 80.0–100.0)
Monocytes Absolute: 0.7 10*3/uL (ref 0.1–1.0)
Monocytes Relative: 10 %
Neutro Abs: 4.5 10*3/uL (ref 1.7–7.7)
Neutrophils Relative %: 67 %
Platelets: 249 10*3/uL (ref 150–400)
RBC: 3.77 MIL/uL — ABNORMAL LOW (ref 3.87–5.11)
RDW: 13 % (ref 11.5–15.5)
WBC: 6.8 10*3/uL (ref 4.0–10.5)
nRBC: 0 % (ref 0.0–0.2)

## 2023-07-15 LAB — COMPREHENSIVE METABOLIC PANEL WITH GFR
ALT: 13 U/L (ref 0–44)
AST: 14 U/L — ABNORMAL LOW (ref 15–41)
Albumin: 3.2 g/dL — ABNORMAL LOW (ref 3.5–5.0)
Alkaline Phosphatase: 85 U/L (ref 38–126)
Anion gap: 10 (ref 5–15)
BUN: 18 mg/dL (ref 8–23)
CO2: 25 mmol/L (ref 22–32)
Calcium: 10.2 mg/dL (ref 8.9–10.3)
Chloride: 107 mmol/L (ref 98–111)
Creatinine, Ser: 1.19 mg/dL — ABNORMAL HIGH (ref 0.44–1.00)
GFR, Estimated: 46 mL/min — ABNORMAL LOW (ref >60)
Glucose, Bld: 111 mg/dL — ABNORMAL HIGH (ref 70–99)
Potassium: 3.9 mmol/L (ref 3.5–5.1)
Sodium: 142 mmol/L (ref 135–145)
Total Bilirubin: 0.6 mg/dL (ref 0.0–1.2)
Total Protein: 6.2 g/dL — ABNORMAL LOW (ref 6.5–8.1)

## 2023-07-15 LAB — TROPONIN I (HIGH SENSITIVITY)
Troponin I (High Sensitivity): 19 ng/L — ABNORMAL HIGH (ref <18)
Troponin I (High Sensitivity): 23 ng/L — ABNORMAL HIGH (ref <18)
Troponin I (High Sensitivity): 20 ng/L — ABNORMAL HIGH (ref <18)

## 2023-07-15 LAB — LIPID PANEL
Cholesterol: 230 mg/dL — ABNORMAL HIGH (ref 0–200)
HDL: 34 mg/dL — ABNORMAL LOW (ref >40)
LDL Cholesterol: 160 mg/dL — ABNORMAL HIGH (ref 0–99)
Total CHOL/HDL Ratio: 6.8 ratio
Triglycerides: 178 mg/dL — ABNORMAL HIGH (ref <150)
VLDL: 36 mg/dL (ref 0–40)

## 2023-07-15 LAB — HEMOGLOBIN A1C
Hgb A1c MFr Bld: 5.2 % (ref 4.8–5.6)
Mean Plasma Glucose: 102.54 mg/dL

## 2023-07-15 LAB — LIPASE, BLOOD: Lipase: 33 U/L (ref 11–51)

## 2023-07-15 LAB — MAGNESIUM: Magnesium: 2.2 mg/dL (ref 1.7–2.4)

## 2023-07-15 MED ORDER — ACETAMINOPHEN 650 MG RE SUPP: 650.0000 mg | Freq: Four times a day (QID) | RECTAL | Status: DC | PRN

## 2023-07-15 MED ORDER — AMLODIPINE BESYLATE 5 MG PO TABS
5.0000 mg | ORAL_TABLET | Freq: Every day | ORAL | Status: DC
Start: 2023-07-15 — End: 2023-07-15

## 2023-07-15 MED ORDER — ONDANSETRON HCL 4 MG/2ML IJ SOLN: 4.0000 mg | Freq: Four times a day (QID) | INTRAMUSCULAR | Status: DC | PRN

## 2023-07-15 MED ORDER — MELATONIN 3 MG PO TABS: 3.0000 mg | ORAL_TABLET | Freq: Every evening | ORAL | Status: DC | PRN

## 2023-07-15 MED ORDER — ENSURE ENLIVE PO LIQD
237.0000 mL | Freq: Two times a day (BID) | ORAL | Status: DC
  Administered 2023-07-15 – 2023-07-16 (×2): 237 mL via ORAL

## 2023-07-15 MED ORDER — POLYETHYLENE GLYCOL 3350 17 G PO PACK
17.0000 g | PACK | Freq: Every day | ORAL | Status: DC | PRN
  Administered 2023-07-16: 17 g via ORAL
  Filled 2023-07-15: qty 1

## 2023-07-15 MED ORDER — IRBESARTAN 75 MG PO TABS
75.0000 mg | ORAL_TABLET | Freq: Once | ORAL | Status: DC
  Filled 2023-07-15: qty 1

## 2023-07-15 MED ORDER — ESCITALOPRAM OXALATE 10 MG PO TABS
10.0000 mg | ORAL_TABLET | Freq: Every day | ORAL | Status: DC
  Administered 2023-07-15: 10 mg via ORAL
  Filled 2023-07-15 (×2): qty 1

## 2023-07-15 MED ORDER — ENOXAPARIN SODIUM 40 MG/0.4ML IJ SOSY
40.0000 mg | PREFILLED_SYRINGE | Freq: Every day | INTRAMUSCULAR | Status: DC
  Administered 2023-07-15 – 2023-07-16 (×2): 40 mg via SUBCUTANEOUS
  Filled 2023-07-15 (×2): qty 0.4

## 2023-07-15 MED ORDER — ACETAMINOPHEN 325 MG PO TABS: 650.0000 mg | ORAL_TABLET | Freq: Four times a day (QID) | ORAL | Status: DC | PRN

## 2023-07-15 MED ORDER — PANTOPRAZOLE SODIUM 40 MG IV SOLR
40.0000 mg | INTRAVENOUS | Status: DC
  Administered 2023-07-16: 40 mg via INTRAVENOUS
  Filled 2023-07-15: qty 10

## 2023-07-15 MED ORDER — PNEUMOCOCCAL 20-VAL CONJ VACC 0.5 ML IM SUSY
0.5000 mL | PREFILLED_SYRINGE | INTRAMUSCULAR | Status: AC
  Administered 2023-07-16: 0.5 mL via INTRAMUSCULAR
  Filled 2023-07-15: qty 0.5

## 2023-07-15 MED ORDER — LACTATED RINGERS IV BOLUS
500.0000 mL | Freq: Once | INTRAVENOUS | Status: AC
  Administered 2023-07-15: 500 mL via INTRAVENOUS

## 2023-07-15 MED ORDER — FUROSEMIDE 20 MG PO TABS: 20.0000 mg | ORAL_TABLET | Freq: Every day | ORAL | Status: DC | PRN

## 2023-07-15 MED ORDER — CLOPIDOGREL BISULFATE 75 MG PO TABS
75.0000 mg | ORAL_TABLET | Freq: Every day | ORAL | Status: DC
  Administered 2023-07-15: 75 mg via ORAL
  Filled 2023-07-15: qty 1

## 2023-07-15 MED ORDER — FENTANYL CITRATE PF 50 MCG/ML IJ SOSY: 12.5000 ug | PREFILLED_SYRINGE | INTRAMUSCULAR | Status: DC | PRN

## 2023-07-15 MED ORDER — NALOXONE HCL 0.4 MG/ML IJ SOLN: 0.4000 mg | INTRAMUSCULAR | Status: DC | PRN

## 2023-07-15 MED ORDER — NITROGLYCERIN 0.4 MG SL SUBL: 0.4000 mg | SUBLINGUAL_TABLET | SUBLINGUAL | Status: DC | PRN

## 2023-07-15 NOTE — H&P (Addendum)
 History and Physical    Patient: [name] ION:[id number] DOB: May 27, 1940 DOA: 07/14/2023 DOS: the patient was seen and examined on 07/15/2023 PCP: Sirivol, Mamatha, MD  Patient coming from: Home  Chief Complaint:  Chief Complaint  Patient presents with   Chest Pain    C/o left sided 10/10 chest pain with EMS and received 324mg  ASA prior to arrival. EMS reports 1 episode of vomiting and then chest pain was relieved. Patient denies any chest pain on arrival to the ED   HPI: [name] is a 83 y.o. female with medical history significant of CHF, GERD, hypertension, LBBB, hyperlipidemia, CKD-III, and CVA.  At around 1 PM yesterday afternoon she began having substernal chest pain that radiated across her chest/upper abdomen.  She denies any radiation to her arms or jaw.  She endorses one episodes of non-bloody non-bilious emesis later that evening. Pain resolved in the ED prior to evaluation and she denies chest pain at this time. The patient reports the pain she was experiencing was indigestion and was similar to pain she had in the past with ingestion. She denies change in eating habits. She endorses ongoing dyspnea that is made worse with activity and overall currently unchanged. Denies palpitations, diaphoresis, diarrhea.  ED Course: On arrival to [facility] ED patient was noted to be afebrile temp 36.8C, BP 159/73, HR 86, RR20, SPO2 98% on Room air.  CXR obtained and shows a large air-filled hiatal hernia and chronic interstitial opacities. Labs notable for troponin 14-->19-->23,  and creatinine 1.19. She was given Maalox/Mylanta and Pepcid . Cardiology consulted who recommended observation. [facility] contacted for admission.    Review of Systems: As mentioned in the history of present illness. All other systems reviewed and are negative. Past Medical History:  Diagnosis Date   Acute embolic stroke (HCC)    Benign paroxysmal vertigo, unspecified ear 06/07/2019   CKD (chronic kidney  disease), stage III (HCC)    GERD (gastroesophageal reflux disease)    Glaucoma    Incisional infection 12/25/2020   Past Surgical History:  Procedure Laterality Date   ABDOMINAL HYSTERECTOMY     BACK SURGERY     cataract surgery     COLONOSCOPY N/A 08/03/2015   Procedure: COLONOSCOPY;  Surgeon: Jolinda Necessary, MD;  Location: [facility] ENDOSCOPY;  Service: Endoscopy;  Laterality: N/A;   ESOPHAGOGASTRODUODENOSCOPY N/A 07/31/2015   Procedure: ESOPHAGOGASTRODUODENOSCOPY (EGD);  Surgeon: Jolinda Necessary, MD;  Location: [facility] ENDOSCOPY;  Service: Endoscopy;  Laterality: N/A;   IR ANGIO INTRA EXTRACRAN SEL COM CAROTID INNOMINATE BILAT MOD SED  06/01/2021   IR ANGIO VERTEBRAL SEL SUBCLAVIAN INNOMINATE UNI L MOD SED  06/01/2021   IR RADIOLOGIST EVAL & MGMT  04/26/2021   OOPHORECTOMY     TOTAL HIP ARTHROPLASTY     URETER SURGERY     Social History:  reports that she has never smoked. She has never used smokeless tobacco. She reports that she does not drink alcohol and does not use drugs.  Allergies  Allergen Reactions   Codeine Nausea And Vomiting   Gabapentin      Dizziness, couldn't function    Meclizine Other (See Comments)    Lethargy, extreme sleepiness   Requip [Ropinirole Hcl] Nausea And Vomiting   Statins     Muscle weakness    Family History  Problem Relation Age of Onset   Hypertension Mother    Hypertension Father    Heart attack Father    Hypertension Brother    Diabetes Brother  Prior to Admission medications   Medication Sig Start Date End Date Taking? Authorizing Provider  acetaminophen  (TYLENOL ) 325 MG tablet Take 650 mg by mouth every 4 (four) hours as needed.   Yes [provider]  amLODipine  (NORVASC ) 5 MG tablet Take 1 tablet (5 mg total) by mouth daily. 04/24/23  Yes Sirivol, Mamatha, MD  Cholecalciferol  1.25 MG (50000 UT) capsule Take 50,000 Units by mouth once a week. Take one capsule at bedtime on Tuesday evening.   Yes [provider]  clopidogrel   (PLAVIX ) 75 MG tablet Take 1 tablet (75 mg total) by mouth daily. Patient taking differently: Take 75 mg by mouth at bedtime. 05/27/23  Yes Sirivol, Mamatha, MD  escitalopram  (LEXAPRO ) 10 MG tablet TAKE 1 TABLET BY MOUTH EVERY DAY 06/24/23  Yes Sirivol, Mamatha, MD  Evolocumab  (REPATHA  SURECLICK) 140 MG/ML SOAJ Inject 140 mg into the skin every 14 (fourteen) days. 12/03/22  Yes Sirivol, Mamatha, MD  famotidine  (PEPCID ) 20 MG tablet Take 1 tablet (20 mg total) by mouth 2 (two) times daily. 05/27/23  Yes Sirivol, Mamatha, MD  furosemide  (LASIX ) 20 MG tablet Take 1 tablet (20 mg total) by mouth daily as needed for edema. 12/03/22  Yes Sirivol, Mamatha, MD  loratadine  (CLARITIN ) 10 MG tablet Take 10 mg by mouth at bedtime.   Yes [provider]  Multiple Vitamins-Minerals (PRESERVISION AREDS) CAPS Take 1 capsule by mouth 2 (two) times daily.   Yes [provider]  OVER THE COUNTER MEDICATION Apply 1 application. topically daily as needed (knee pain). Hempvana pain relief cream   Yes [provider]  valsartan  (DIOVAN ) 80 MG tablet Take 2 tablets (160 mg total) by mouth daily. If systolic BP greater than 140 mmHG give 160 mg by mouth daily, if systolic BP between 120-140 mmHG give 80 mg by mouth daily Patient taking differently: Take 80 mg by mouth daily. If systolic BP greater than 140 mmHG give 80 mg by mouth daily. 05/27/23  Yes Sirivol, Mamatha, MD  vitamin B-12 (CYANOCOBALAMIN ) 1000 MCG tablet Take 1,000 mcg by mouth every evening.   Yes [provider]  latanoprost  (XALATAN ) 0.005 % ophthalmic solution Place 1 drop into both eyes at bedtime. Patient not taking: Reported on 07/15/2023 12/09/18   [provider]    Physical Exam: Vitals:   07/14/23 2003 07/15/23 0045 07/15/23 0330 07/15/23 0400  BP:  (!) 144/88 (!) 174/78 (!) 157/60  Pulse:  77 72 73  Resp:  20 (!) 22 18  Temp:  98.2 F (36.8 C)  97.9 F (36.6 C)  TempSrc:  Oral  Oral  SpO2: 98% 98% 99% 97%   Weight: 67.1 kg     Height: 5' (1.524 m)      Constitutional: Elderly caucasian woman, NAD, calm, comfortable Eyes: PERRL, lids and conjunctivae normal ENMT: Mucous membranes are moist. Posterior pharynx clear of any exudate or lesions.  Neck: normal, supple, no masses, no thyromegaly Respiratory: clear to auscultation bilaterally, no wheezing, no crackles. Normal respiratory effort. No accessory muscle use.  Cardiovascular: Regular rate and rhythm, no murmurs / rubs / gallops. +1 bilateral lower rextremity edema. 2+radial and pedal pulses.   Abdomen: no tenderness, no masses palpated. No hepatosplenomegaly. Bowel sounds positive x 4 quadrants.  Musculoskeletal: no clubbing / cyanosis. No joint deformity upper and lower extremities. Good ROM, no contractures. Normal muscle tone.  Skin: Warm, dry. no rashes, lesions, ulcers.  Neurologic: CN 2-12 grossly intact. Alert and oriented x 3. Normal mood.  Data Reviewed:  CBC    Component Value Date/Time   WBC 6.8 07/15/2023 0329   RBC 3.77 (L) 07/15/2023 0329   HGB 11.6 (L) 07/15/2023 0329   HGB 12.2 03/14/2023 1117   HCT 35.8 (L) 07/15/2023 0329   HCT 37.9 03/14/2023 1117   PLT 249 07/15/2023 0329   PLT 292 03/14/2023 1117   MCV 95.0 07/15/2023 0329   MCV 94 03/14/2023 1117   MCH 30.8 07/15/2023 0329   MCHC 32.4 07/15/2023 0329   RDW 13.0 07/15/2023 0329   RDW 12.3 03/14/2023 1117   LYMPHSABS 1.5 07/15/2023 0329   LYMPHSABS 1.0 03/14/2023 1117   MONOABS 0.7 07/15/2023 0329   EOSABS 0.1 07/15/2023 0329   EOSABS 0.1 03/14/2023 1117   BASOSABS 0.0 07/15/2023 0329   BASOSABS 0.0 03/14/2023 1117   CMP     Component Value Date/Time   NA 142 07/15/2023 0329   NA 142 03/14/2023 1117   K 3.9 07/15/2023 0329   CL 107 07/15/2023 0329   CO2 25 07/15/2023 0329   GLUCOSE 111 (H) 07/15/2023 0329   BUN 18 07/15/2023 0329   BUN 26 03/14/2023 1117   CREATININE 1.19 (H) 07/15/2023 0329   CALCIUM  10.2 07/15/2023 0329   PROT 6.2 (L)  07/15/2023 0329   PROT 6.7 03/14/2023 1117   ALBUMIN 3.2 (L) 07/15/2023 0329   ALBUMIN 3.9 03/14/2023 1117   AST 14 (L) 07/15/2023 0329   ALT 13 07/15/2023 0329   ALKPHOS 85 07/15/2023 0329   BILITOT 0.6 07/15/2023 0329   BILITOT 0.4 03/14/2023 1117   EGFR 49 (L) 03/14/2023 1117   GFRNONAA 46 (L) 07/15/2023 0329   Lipase     Component Value Date/Time   LIPASE 33 07/15/2023 0329   Cardiac Panel (last 3 results) Recent Labs    07/14/23 2013 07/14/23 2316 07/15/23 0820  TROPONINIHS 14 19* 23*   Magnesium     Component Value Date/Time   MAGNESIUM  2.2 07/17/2023 03:29    Assessment and Plan: Active Medical Problems:  ##Chest Pain Patient endorses substernal chest pain that began after eating lunch yesterday radiates across the right and left side of her chest/upper abdomen and has since subsided. She is unable to provide characteristics of the pain and states "It's just indigestion" Daughter is concerned H.pylori could be the etiology of her abdominal symptoms. - ECHO - Repeat EKG PRN for reports of Chest pain - Trend Troponin, Check Lipid Panel and A1C - PRN Analgesia and Antiemetics - Check H. Pylori antigen  #Acute Kidney Injury #Chronic Kidney Disease Creatinine 1.19; Baseline creatinine 1.0-1.2 over the last year - Gentle IVF hydration - Avoid nephrotoxins, Contrast Dyes, Hypotension and Dehydration  - Repeat BMP with AM labs   Chronic Medical Problems:  #GERD- PPI #Hypertension- continue home amlodipine  and valsartan  #Hyperlipidemia- continue home Repatha  at discharge #Hx of CVA- continue home plavix   VTE prophylaxis: Lovenox  GI prophylaxis: Protonix  Diet: Heart Healthy Access: PIV Lines: NONE Code Status: DNR-Limited Telemetry: Yes Disposition: Patient is from: Home  Anticipated d/c is to: Home  Anticipated d/c date is: 07/16/23 Patient currently: Pending ECHO  Advance Care Planning:   Code Status: Limited: Do not attempt resuscitation (DNR)  -DNR-LIMITED -Do Not Intubate/DNI    Consults: N/A  Family Communication: Daughter at bedside  Severity of Illness: The appropriate patient status for this patient is OBSERVATION. Observation status is judged to be reasonable and necessary in order to provide the required intensity of service to ensure the patient's safety. The patient's presenting  symptoms, physical exam findings, and initial radiographic and laboratory data in the context of their medical condition is felt to place them at decreased risk for further clinical deterioration. Furthermore, it is anticipated that the patient will be medically stable for discharge from the hospital within 2 midnights of admission.   To reach the provider On-Call:   7AM- 7PM see care teams to locate the attending and reach out to them via [email or web address] Password: [email or web address] 7PM-7AM contact night-coverage If you still have difficulty reaching the appropriate provider, please page the [facility] (Director on Call) for [facility] on amion for assistance  This document was prepared using Conservation officer, historic buildings and may include unintentional dictation errors.  Naida Austria FNP-BC, PMHNP-BC Nurse Practitioner [facility]

## 2023-07-15 NOTE — ED Provider Notes (Signed)
 I consulted with cardiology, who recommends medicine admission for chest pain rule out.  Continue to trend troponins.  If trops spike, then reconsult cardiology in AM.  I consulted with Dr. Brock Canner, who is appreciated for admitting.   Sherel Dikes, PA-C 07/15/23 Delphina Fiddler    Eldon Greenland, MD 07/15/23 [phone]

## 2023-07-15 NOTE — Plan of Care (Signed)
   Problem: Activity: Goal: Ability to tolerate increased activity will improve Outcome: Progressing

## 2023-07-15 NOTE — Progress Notes (Signed)
  Carryover admission to the Day Admitter.  I discussed this case with the EDP, Sherel Dikes, PA.  Per these discussions:   This is a 83 year old female with history of GERD, who is being admitted for overnight observation for chest pain.  Her initial episode of chest pain started around 1300 on 07/14/2023 for which she was brought to [facility] ED via EMS, who administered a full dose aspirin  x 1 and route.   Her chest pain subsequently resolved in spontaneous fashion while in the ED, but has subsequently recurred, with mild residual chest discomfort noted at this time.  She has not had any nitroglycerin  thus far.  Initial troponin 14, with repeat trending up slightly to 19.  EKG is reported to show no evidence of acute ischemic changes, chest x-ray is reported to show no evidence of acute cardiopulmonary process.  EDP has discussed with on-call cardiology fellow, who does not recommend initiation of heparin  drip at this time, but rather recommends further trending of troponin, and conveys that cardiology is available as needed if additional consultation is needed.   I have placed an order for observation to cardiac telemetry for further evaluation management of the above.  I have placed some additional preliminary admit orders via the adult multi-morbid admission order set. I have also ordered as needed sublingual nitroglycerin  for chest pain as well as as needed IV fentanyl  for chest pain not resolved by nitroglycerin .  I have ordered morning labs that include CMP, CBC, magnesium  level.  Also ordered a third  troponin as well as echocardiogram for the morning.  Given the patient's history of GERD, also ordered Protonix  40 mg IV daily, first dose now. Of note, based upon code status documentation from her 2 most recent hospitalizations, I have set her code status as DNR.     Camelia Cavalier, DO Hospitalist

## 2023-07-16 ENCOUNTER — Observation Stay (HOSPITAL_BASED_OUTPATIENT_CLINIC_OR_DEPARTMENT_OTHER)

## 2023-07-16 ENCOUNTER — Observation Stay (HOSPITAL_COMMUNITY)

## 2023-07-16 DIAGNOSIS — R079 Chest pain, unspecified: Secondary | ICD-10-CM

## 2023-07-16 LAB — ECHOCARDIOGRAM COMPLETE
Area-P 1/2: 1.92 cm2
Height: 60 in
S' Lateral: 2 cm
Weight: 2437.41 [oz_av]

## 2023-07-16 MED ORDER — PANTOPRAZOLE SODIUM 40 MG PO TBEC: 40.0000 mg | DELAYED_RELEASE_TABLET | Freq: Every day | ORAL | 1 refills | Status: DC

## 2023-07-16 NOTE — Discharge Summary (Signed)
 [facility]  Physician Discharge Summary   Patient ID: [name] MRN: [id number] DOB/AGE: 1940-05-28 83 y.o.  Admit date: 07/14/2023 Discharge date: 07/16/2023    PCP: Sirivol, Mamatha, MD  DISCHARGE DIAGNOSES:  Active Problems:   Chest pain   AKI (acute kidney injury) (HCC)   RECOMMENDATIONS FOR OUTPATIENT FOLLOW UP: Follow-up with primary care provider   Home Health: None Equipment/Devices: None  CODE STATUS: Full code  DISCHARGE CONDITION: fair  Diet recommendation: As before  INITIAL HISTORY: 83 y.o. female with medical history significant of CHF, GERD, hypertension, LBBB, hyperlipidemia, CKD-III, and CVA.  At around 1 PM yesterday afternoon she began having substernal chest pain that radiated across her chest/upper abdomen.  She denies any radiation to her arms or jaw.  She endorses one episodes of non-bloody non-bilious emesis later that evening. Pain resolved in the ED prior to evaluation and she denies chest pain at this time. The patient reports the pain she was experiencing was indigestion and was similar to pain she had in the past with ingestion. She denies change in eating habits. She endorses ongoing dyspnea that is made worse with activity and overall currently unchanged. Denies palpitations, diaphoresis, diarrhea.   Consultations: None  Procedures: Echocardiogram Lower extremity venous Dopplers   HOSPITAL COURSE:   Patient presented with atypical chest pain.  Patient with large hiatal hernia.  Symptoms occurred after she ate a meal.  Thought to be GI in origin.  Placed on PPI.  Was given Maalox with resolution in symptoms.  Mildly elevated troponin level of no clinical significance.  Echocardiogram shows normal LVEF without any wall motion abnormalities.  Lower extremity Doppler studies without DVT.  Patient reassured.  Discussed with daughter.  PPI to be continued on once daily scheduled.  Outpatient follow-up with primary care provider.  She  may continue her other home medications as before.  Patient is stable for discharge home today.   PERTINENT LABS:  The results of significant diagnostics from this hospitalization (including imaging, microbiology, ancillary and laboratory) are listed below for reference.     Labs:   Basic Metabolic Panel: Recent Labs  Lab 07/14/23 2013 07/15/23 0329  NA 141 142  K 4.1 3.9  CL 107 107  CO2 22 25  GLUCOSE 152* 111*  BUN 21 18  CREATININE 1.25* 1.19*  CALCIUM  10.1 10.2  MG  --  2.2   Liver Function Tests: Recent Labs  Lab 07/14/23 2013 07/15/23 0329  AST 20 14*  ALT 10 13  ALKPHOS 94 85  BILITOT 0.7 0.6  PROT 6.6 6.2*  ALBUMIN 3.7 3.2*   Recent Labs  Lab 07/15/23 0329  LIPASE 33   CBC: Recent Labs  Lab 07/14/23 2013 07/15/23 0329  WBC 7.2 6.8  NEUTROABS 5.4 4.5  HGB 12.4 11.6*  HCT 38.6 35.8*  MCV 96.3 95.0  PLT 262 249     IMAGING STUDIES VAS US  LOWER EXTREMITY VENOUS (DVT) Result Date: 07/16/2023  Lower Venous DVT Study Patient Name:  [name]  Date of Exam:   07/16/2023 Medical Rec #: [id number]       Accession #:    [id number] Date of Birth: 07-03-1940       Patient Gender: F Patient Age:   32 years Exam Location:  [facility] Procedure:      VAS US  LOWER EXTREMITY VENOUS (DVT) Referring Phys: Maylene Spear --------------------------------------------------------------------------------  Indications: Pain. Other Indications: HTN, CHF. Comparison Study: Previous study on 3.13.2023. Performing Technologist: Hal Levans  [place]  Examination Guidelines: A complete evaluation includes B-mode imaging, spectral Doppler, color Doppler, and power Doppler as needed of all accessible portions of each vessel. Bilateral testing is considered an integral part of a complete examination. Limited examinations for reoccurring indications may be performed as noted. The reflux portion of the exam is performed with the patient in reverse Trendelenburg.   +---------+---------------+---------+-----------+----------+--------------+ RIGHT    CompressibilityPhasicitySpontaneityPropertiesThrombus Aging +---------+---------------+---------+-----------+----------+--------------+ CFV      Full           Yes      Yes                                 +---------+---------------+---------+-----------+----------+--------------+ SFJ      Full           Yes      Yes                                 +---------+---------------+---------+-----------+----------+--------------+ FV Prox  Full                                                        +---------+---------------+---------+-----------+----------+--------------+ FV Mid   Full                                                        +---------+---------------+---------+-----------+----------+--------------+ FV DistalFull                                                        +---------+---------------+---------+-----------+----------+--------------+ PFV      Full                                                        +---------+---------------+---------+-----------+----------+--------------+ POP      Full           Yes      Yes                                 +---------+---------------+---------+-----------+----------+--------------+ PTV      Full                                                        +---------+---------------+---------+-----------+----------+--------------+ PERO     Full                                                        +---------+---------------+---------+-----------+----------+--------------+  Cystic structure in popliteal fossa measuring 4.6 x 0.7 x 2.1 cm  +----+---------------+---------+-----------+----------+--------------+ LEFTCompressibilityPhasicitySpontaneityPropertiesThrombus Aging +----+---------------+---------+-----------+----------+--------------+ CFV Full           Yes      Yes                                  +----+---------------+---------+-----------+----------+--------------+ SFJ Full           Yes      Yes                                 +----+---------------+---------+-----------+----------+--------------+    Summary: RIGHT: - There is no evidence of deep vein thrombosis in the lower extremity.  - No cystic structure found in the popliteal fossa.  LEFT: - No evidence of common femoral vein obstruction.   *See table(s) above for measurements and observations.    Preliminary    ECHOCARDIOGRAM COMPLETE Result Date: 07/16/2023    ECHOCARDIOGRAM REPORT   Patient Name:   [name] Date of Exam: 07/16/2023 Medical Rec #:  [id number]      Height:       60.0 in Accession #:    [id number]     Weight:       152.3 lb Date of Birth:  1940/10/05      BSA:          1.663 m Patient Age:    82 years       BP:           130/87 mmHg Patient Gender: F              HR:           68 bpm. Exam Location:  Inpatient Procedure: 2D Echo, Color Doppler and Cardiac Doppler (Both Spectral and Color            Flow Doppler were utilized during procedure). Indications:    Chest Pain R07.9  History:        Patient has prior history of Echocardiogram examinations, most                 recent 07/05/2021.  Sonographer:    Hersey Lorenzo RDCS Referring Phys: [id number] Maylene Spear IMPRESSIONS  1. Left ventricular ejection fraction, by estimation, is 65 to 70%. The left ventricle has normal function. The left ventricle has no regional wall motion abnormalities. There is mild concentric left ventricular hypertrophy. Left ventricular diastolic parameters are consistent with Grade I diastolic dysfunction (impaired relaxation).  2. Right ventricular systolic function is normal. The right ventricular size is normal.  3. The mitral valve is normal in structure. Trivial mitral valve regurgitation. No evidence of mitral stenosis.  4. The aortic valve is calcified. There is moderate calcification of the aortic valve. There is moderate thickening of the  aortic valve. Aortic valve regurgitation is not visualized. Aortic valve sclerosis/calcification is present, without any evidence of aortic stenosis.  5. The inferior vena cava is normal in size with greater than 50% respiratory variability, suggesting right atrial pressure of 3 mmHg. FINDINGS  Left Ventricle: Left ventricular ejection fraction, by estimation, is 65 to 70%. The left ventricle has normal function. The left ventricle has no regional wall motion abnormalities. The left ventricular internal cavity size was normal in size. There is  mild concentric left ventricular hypertrophy. Left ventricular diastolic parameters are consistent with  Grade I diastolic dysfunction (impaired relaxation). Indeterminate filling pressures. Right Ventricle: The right ventricular size is normal. No increase in right ventricular wall thickness. Right ventricular systolic function is normal. Left Atrium: Left atrial size was normal in size. Right Atrium: Right atrial size was normal in size. Pericardium: There is no evidence of pericardial effusion. Mitral Valve: The mitral valve is normal in structure. There is mild calcification of the mitral valve leaflet(s). Mild mitral annular calcification. Trivial mitral valve regurgitation. No evidence of mitral valve stenosis. Tricuspid Valve: The tricuspid valve is normal in structure. Tricuspid valve regurgitation is trivial. No evidence of tricuspid stenosis. Aortic Valve: The aortic valve is calcified. There is moderate calcification of the aortic valve. There is moderate thickening of the aortic valve. Aortic valve regurgitation is not visualized. Aortic valve sclerosis/calcification is present, without any  evidence of aortic stenosis. Pulmonic Valve: The pulmonic valve was normal in structure. Pulmonic valve regurgitation is trivial. No evidence of pulmonic stenosis. Aorta: The aortic root is normal in size and structure. Venous: The inferior vena cava is normal in size with  greater than 50% respiratory variability, suggesting right atrial pressure of 3 mmHg. IAS/Shunts: No atrial level shunt detected by color flow Doppler.  LEFT VENTRICLE PLAX 2D LVIDd:         3.30 cm   Diastology LVIDs:         2.00 cm   LV e' medial:    3.05 cm/s LV PW:         1.20 cm   LV E/e' medial:  18.7 LV IVS:        1.20 cm   LV e' lateral:   3.37 cm/s LVOT diam:     1.80 cm   LV E/e' lateral: 16.9 LV SV:         43 LV SV Index:   26 LVOT Area:     2.54 cm  RIGHT VENTRICLE         IVC TAPSE (M-mode): 1.1 cm  IVC diam: 1.00 cm LEFT ATRIUM             Index LA diam:        2.60 cm 1.56 cm/m LA Vol (A2C):   30.2 ml 18.16 ml/m LA Vol (A4C):   31.7 ml 19.07 ml/m LA Biplane Vol: 30.9 ml 18.58 ml/m  AORTIC VALVE LVOT Vmax:   83.50 cm/s LVOT Vmean:  54.400 cm/s LVOT VTI:    0.170 m  AORTA Ao Root diam: 2.50 cm Ao Asc diam:  2.80 cm MITRAL VALVE MV Area (PHT): 1.92 cm     SHUNTS MV Decel Time: 395 msec     Systemic VTI:  0.17 m MV E velocity: 57.00 cm/s   Systemic Diam: 1.80 cm MV A velocity: 115.00 cm/s MV E/A ratio:  0.50 Gaylyn Keas MD Electronically signed by Gaylyn Keas MD Signature Date/Time: 07/16/2023/11:05:34 AM    Final    DG Chest Portable 1 View Result Date: 07/14/2023 CLINICAL DATA:  Chest pain EXAM: PORTABLE CHEST 1 VIEW COMPARISON:  08/24/2022 FINDINGS: Large air-filled hiatal hernia. Probable cardiomegaly. Mild diffuse interstitial opacity appears chronic. No pleural effusion or pneumothorax. Aortic atherosclerosis IMPRESSION: Large air-filled hiatal hernia. Probable cardiomegaly. Mild diffuse interstitial opacity appears chronic. Electronically Signed   By: Esmeralda Hedge M.D.   On: 07/14/2023 23:59    DISCHARGE EXAMINATION: Vitals:   07/16/23 0441 07/16/23 0736 07/16/23 1130 07/16/23 1528  BP:  130/87 113/71 (!) 161/57  Pulse:  70 68 69  Resp: 15 16 20 14   Temp:  98.3 F (36.8 C) 98.1 F (36.7 C) 98.3 F (36.8 C)  TempSrc:  Oral Oral Oral  SpO2:  97% 90% 98%  Weight: 69.1 kg      Height:       General appearance: Awake alert.  In no distress Resp: Clear to auscultation bilaterally.  Normal effort Cardio: S1-S2 is normal regular.  No S3-S4.  No rubs murmurs or bruit GI: Abdomen is soft.  Nontender nondistended.  Bowel sounds are present normal.  No masses organomegaly   DISPOSITION: Home  Discharge Instructions     Call MD for:  extreme fatigue   Complete by: As directed    Call MD for:  persistant dizziness or light-headedness   Complete by: As directed    Call MD for:  persistant nausea and vomiting   Complete by: As directed    Call MD for:  severe uncontrolled pain   Complete by: As directed    Call MD for:  temperature >100.4   Complete by: As directed    Diet - low sodium heart healthy   Complete by: As directed    Discharge instructions   Complete by: As directed    Please take your medications as prescribed.  Please be sure to follow-up with your primary care provider in 1 week.  You were cared for by a hospitalist during your hospital stay. If you have any questions about your discharge medications or the care you received while you were in the hospital after you are discharged, you can call the unit and asked to speak with the hospitalist on call if the hospitalist that took care of you is not available. Once you are discharged, your primary care physician will handle any further medical issues. Please note that NO REFILLS for any discharge medications will be authorized once you are discharged, as it is imperative that you return to your primary care physician (or establish a relationship with a primary care physician if you do not have one) for your aftercare needs so that they can reassess your need for medications and monitor your lab values. If you do not have a primary care physician, you can call [phone] for a physician referral.   Increase activity slowly   Complete by: As directed          Allergies as of 07/16/2023       Reactions    Codeine Nausea And Vomiting   Gabapentin     Dizziness, couldn't function    Meclizine Other (See Comments)   Lethargy, extreme sleepiness   Requip [ropinirole Hcl] Nausea And Vomiting   Statins    Muscle weakness        Medication List     TAKE these medications    acetaminophen  325 MG tablet Commonly known as: TYLENOL  Take 650 mg by mouth every 4 (four) hours as needed.   amLODipine  5 MG tablet Commonly known as: NORVASC  Take 1 tablet (5 mg total) by mouth daily.   Cholecalciferol  1.25 MG (50000 UT) capsule Take 50,000 Units by mouth once a week. Take one capsule at bedtime on Tuesday evening.   clopidogrel  75 MG tablet Commonly known as: PLAVIX  Take 1 tablet (75 mg total) by mouth daily. What changed: when to take this   cyanocobalamin  1000 MCG tablet Commonly known as: VITAMIN B12 Take 1,000 mcg by mouth every evening.   escitalopram  10 MG tablet Commonly known as: LEXAPRO  TAKE 1 TABLET BY MOUTH EVERY  DAY   famotidine  20 MG tablet Commonly known as: Pepcid  Take 1 tablet (20 mg total) by mouth 2 (two) times daily.   furosemide  20 MG tablet Commonly known as: LASIX  Take 1 tablet (20 mg total) by mouth daily as needed for edema.   latanoprost  0.005 % ophthalmic solution Commonly known as: XALATAN  Place 1 drop into both eyes at bedtime.   loratadine  10 MG tablet Commonly known as: CLARITIN  Take 10 mg by mouth at bedtime.   OVER THE COUNTER MEDICATION Apply 1 application. topically daily as needed (knee pain). Hempvana pain relief cream   pantoprazole  40 MG tablet Commonly known as: Protonix  Take 1 tablet (40 mg total) by mouth daily.   PreserVision AREDS Caps Take 1 capsule by mouth 2 (two) times daily.   Repatha  SureClick 140 MG/ML Soaj Generic drug: Evolocumab  Inject 140 mg into the skin every 14 (fourteen) days.   valsartan  80 MG tablet Commonly known as: DIOVAN  Take 2 tablets (160 mg total) by mouth daily. If systolic BP greater than 140  mmHG give 160 mg by mouth daily, if systolic BP between 120-140 mmHG give 80 mg by mouth daily What changed:  how much to take additional instructions          Follow-up Information     Sirivol, Mamatha, MD In 1 week.   Specialty: Family Medicine Contact information: [address] [place] [address] [place] [address] [phone]         Loyde Rule, MD In 2 days.   Specialty: Cardiology Contact information: [address] [place] [address] [phone]         [facility] Follow up.   Specialty: Home Health Services Why: Agency will call you to set up apt times Contact information: [address] [place] [address] [phone]                 TOTAL DISCHARGE TIME: 35 minutes  Avry Roedl Lyndon Santiago  [facility] Pager on [email or web address]  07/17/2023, 11:36 AM

## 2023-07-16 NOTE — Care Management Obs Status (Signed)
 MEDICARE OBSERVATION STATUS NOTIFICATION   Patient Details  Name: [name] MRN: [id number] Date of Birth: 04-03-1940   Medicare Observation Status Notification Given:  Yes    Janith Melnick 07/16/2023, 8:42 AM

## 2023-07-16 NOTE — Evaluation (Signed)
 Physical Therapy Evaluation Patient Details Name: [name] MRN: [id number] DOB: June 29, 1940 Today's Date: 07/16/2023  History of Present Illness  [name] is a 83 y.o. female who presented to the ED via EMS on 07/15/23 with c/o left sided 10/10 chest pain. Pt reports she began having substernal chest pain that radiated across her chest/upper abdomen. She endorsed 1 episode of non-bloody non-bilious emesis later that evening, which relieved the pain (likely indigestion). She endorses ongoing dyspnea that is made worse with activity and overall currently unchanged. CXR obtained and shows a large air-filled hiatal hernia and chronic interstitial opacities. PMH significant for CHF, GERD, hypertension, LBBB, hyperlipidemia, CKD-III, and CVA.   Clinical Impression  Pt admitted with above diagnosis. PTA, pt was modI for functional mobility using a RW and modI for ADLs. She lives alone in a one story house with 2 STE and LHR. Pt relies on her family for all IADLs and reports rarely leaving her home. Pt currently with functional limitations due to the deficits listed below (see PT Problem List). She required CGA-minA for OOB mobility using RW. She ambulated ~180ft with intermittent standing rest breaks at a very slow pace. Pt will benefit from acute skilled PT to increase her independence and safety with mobility to allow discharge home. Recommend HHPT to increase cardiopulmonary endurance, improve BLE strength, and decrease fall risk.    If plan is discharge home, recommend the following: A little help with walking and/or transfers;Assistance with cooking/housework;Assist for transportation;Help with stairs or ramp for entrance   Can travel by private vehicle        Equipment Recommendations None recommended by PT (Pt already has DME)  Recommendations for Other Services       Functional Status Assessment Patient has had a recent decline in their functional status and demonstrates the ability  to make significant improvements in function in a reasonable and predictable amount of time.     Precautions / Restrictions Precautions Precautions: Fall Recall of Precautions/Restrictions: Impaired Restrictions Weight Bearing Restrictions Per Provider Order: No      Mobility  Bed Mobility Overal bed mobility: Needs Assistance Bed Mobility: Supine to Sit     Supine to sit: HOB elevated, Used rails, Supervision, Contact guard     General bed mobility comments: Pt sat up on L side of bed with increased time, HOB elevated, and use of bedrail. She brought BLE off EOB and required CGA at trunk when coming upright and scooting fwd.    Transfers Overall transfer level: Needs assistance Equipment used: Rolling walker (2 wheels) Transfers: Sit to/from Stand Sit to Stand: Contact guard assist           General transfer comment: Pt stood from lowest bed height and commode. VC for proper hand positioning using RW. Pt required CGA to power up into standing and increased time to reach upright posture. Good eccentric control with sitting. Pt took increased time to scoot fwd/bkwd with BUE support.    Ambulation/Gait Ambulation/Gait assistance: Contact guard assist, Min assist Gait Distance (Feet): 150 Feet (1x15, seated rest on commode; 1x150, intermittent standing rests and one seated rest within room; 1x15, seated on recliner chair) Assistive device: Rolling walker (2 wheels) Gait Pattern/deviations: Step-through pattern, Decreased stride length, Trunk flexed, Drifts right/left Gait velocity: decreased Gait velocity interpretation: <1.31 ft/sec, indicative of household ambulator   General Gait Details: Pt ambulated with a reciprocal gait pattern, even weight shift, and good foot clearence. She had difficulty advancing RLE and manuevering RW  secondary to residual RHB weakness following CVA. PT applied minA to control RW and maintain pt inside AD at all times. Pt fatigued with mobility  and required a couple of standing rest breaks to recover. Pt reports ambulating much shorter distances within her house.  Stairs Stairs:  (Deferred d/t fatigue)          Wheelchair Mobility     Tilt Bed    Modified Rankin (Stroke Patients Only)       Balance Overall balance assessment: Needs assistance Sitting-balance support: Feet supported, No upper extremity supported Sitting balance-Leahy Scale: Good Sitting balance - Comments: Pt seated EOB and on commode with supervision. She attended to pericare.   Standing balance support: Bilateral upper extremity supported, During functional activity, Reliant on assistive device for balance Standing balance-Leahy Scale: Poor Standing balance comment: Pt dependent on RW for stability. Drifted R/L in hallway requiring CGA-minA for safety.                             Pertinent Vitals/Pain Pain Assessment Pain Assessment: Faces Faces Pain Scale: Hurts little more Pain Location: BLE (R>L) Pain Descriptors / Indicators: Discomfort, Aching, Sore Pain Intervention(s): Monitored during session, Limited activity within patient's tolerance, Repositioned    Home Living Family/patient expects to be discharged to:: Private residence Living Arrangements: Alone Available Help at Discharge: Family;Available PRN/intermittently Type of Home: House Home Access: Stairs to enter Entrance Stairs-Rails: Left Entrance Stairs-Number of Steps: 2   Home Layout: One level Home Equipment: Toilet riser;Shower seat;BSC/3in1;Wheelchair - Forensic psychologist (2 wheels)      Prior Function Prior Level of Function : Independent/Modified Independent;History of Falls (last six months)             Mobility Comments: Ambulates using RW. Pt's granddaughter reports at least 5 falls in the last 39mo. Pt reports rarely leaving her house and it taking increased time to move around. ADLs Comments: ModI for ADLs. Pt reports using a shower chair to  bath and has a toilet riser on her commode. Pt no longer drives and relies on family for all AIDLs. Pt's granddaughter reports managing medications, grocery shopping, laundry, and meal prepping so the pt will only have to microwave items.     Extremity/Trunk Assessment   Upper Extremity Assessment Upper Extremity Assessment: Defer to OT evaluation    Lower Extremity Assessment Lower Extremity Assessment: Generalized weakness (Pt reports RHB weakness secondary to CVA.)    Cervical / Trunk Assessment Cervical / Trunk Assessment: Kyphotic  Communication   Communication Communication: No apparent difficulties    Cognition Arousal: Alert Behavior During Therapy: WFL for tasks assessed/performed   PT - Cognitive impairments: No apparent impairments                       PT - Cognition Comments: Pt A,Ox4. Following commands: Intact       Cueing Cueing Techniques: Verbal cues, Tactile cues     General Comments General comments (skin integrity, edema, etc.): VSS on RA.    Exercises     Assessment/Plan    PT Assessment Patient needs continued PT services  PT Problem List Decreased strength;Decreased activity tolerance;Decreased balance;Decreased mobility;Decreased safety awareness;Cardiopulmonary status limiting activity       PT Treatment Interventions DME instruction;Gait training;Stair training;Functional mobility training;Therapeutic activities;Therapeutic exercise;Balance training;Patient/family education    PT Goals (Current goals can be found in the Care Plan section)  Acute Rehab PT Goals Patient  Stated Goal: Return Home PT Goal Formulation: With patient/family Time For Goal Achievement: 07/30/23 Potential to Achieve Goals: Good    Frequency Min 2X/week     Co-evaluation               AM-PAC PT "6 Clicks" Mobility  Outcome Measure Help needed turning from your back to your side while in a flat bed without using bedrails?: A Little Help  needed moving from lying on your back to sitting on the side of a flat bed without using bedrails?: A Little Help needed moving to and from a bed to a chair (including a wheelchair)?: A Little Help needed standing up from a chair using your arms (e.g., wheelchair or bedside chair)?: A Little Help needed to walk in hospital room?: A Little Help needed climbing 3-5 steps with a railing? : A Lot 6 Click Score: 17    End of Session Equipment Utilized During Treatment: Gait belt Activity Tolerance: Patient tolerated treatment well;Patient limited by fatigue Patient left: in chair;with call bell/phone within reach;with family/visitor present Nurse Communication: Mobility status;Other (comment) (Need for a bed change and replaced purewick as her bed was soiled) PT Visit Diagnosis: Muscle weakness (generalized) (M62.81);Difficulty in walking, not elsewhere classified (R26.2);Unsteadiness on feet (R26.81);Other abnormalities of gait and mobility (R26.89)    Time: 6433-2951 PT Time Calculation (min) (ACUTE ONLY): 29 min   Charges:   PT Evaluation $PT Eval Low Complexity: 1 Low PT Treatments $Therapeutic Activity: 8-22 mins PT General Charges $$ ACUTE PT VISIT: 1 Visit         Glenford Lanes, PT, DPT Acute Rehabilitation Services Office: [phone] Secure Chat Preferred  Riva Chester 07/16/2023, 1:52 PM

## 2023-07-16 NOTE — Care Management Obs Status (Signed)
 MEDICARE OBSERVATION STATUS NOTIFICATION   Patient Details  Name: [name] MRN: [id number] Date of Birth: 30-Dec-1940   Medicare Observation Status Notification Given:  Yes    Janith Melnick 07/16/2023, 8:33 AM

## 2023-07-16 NOTE — TOC Transition Note (Addendum)
 Transition of Care [facility]) - Discharge Note   Patient Details  Name: [name] MRN: [id number] Date of Birth: 09-Jun-1940  [facility] CM/SW Contact:  Jennett Model, RN Phone Number: 07/16/2023, 11:55 AM   Clinical Narrative:    For possible dc today, pending echo results.  She has transportation. NCM offered choice for HHPT, she states [facility] is ok.  NCM made referral to [facility] with [facility], he is able to take referral.  Soc will begin 24 to 48 hrs post dc.          Patient Goals and CMS Choice            Discharge Placement                       Discharge Plan and Services Additional resources added to the After Visit Summary for                                       Social Drivers of Health (SDOH) Interventions SDOH Screenings   Food Insecurity: No Food Insecurity (07/15/2023)  Housing: Low Risk  (07/15/2023)  Transportation Needs: No Transportation Needs (07/15/2023)  Utilities: Not At Risk (07/15/2023)  Alcohol Screen: Low Risk  (05/27/2023)  Depression (PHQ2-9): Low Risk  (05/27/2023)  Financial Resource Strain: Low Risk  (11/06/2021)  Physical Activity: Inactive (05/27/2023)  Social Connections: Moderately Isolated (07/15/2023)  Stress: No Stress Concern Present (05/27/2023)  Tobacco Use: Low Risk  (07/14/2023)  Health Literacy: Inadequate Health Literacy (05/27/2023)     Readmission Risk Interventions     No data to display

## 2023-07-16 NOTE — Plan of Care (Signed)
   Problem: Education: Goal: Knowledge of General Education information will improve Description: Including pain rating scale, medication(s)/side effects and non-pharmacologic comfort measures Outcome: Progressing   Problem: Clinical Measurements: Goal: Ability to maintain clinical measurements within normal limits will improve Outcome: Progressing Goal: Diagnostic test results will improve Outcome: Progressing

## 2023-07-16 NOTE — Progress Notes (Signed)
   07/16/23 1123  Spiritual Encounters  Type of Visit Initial  Care provided to: Pt and family (Daughter)  Reason for visit Advance directives   Chaplain responded to Spiritual Consult for Advance Care Directive.  Chaplain arrived in room and delivered ACD education. Instructed Pt how to reach out to Spiritual Care if they have any questions and to contact us  when they are ready to move forward.  Chaplain services remain available by Spiritual Consult or for emergent cases, paging [phone]  Chaplain Dewitte Foreman, MDiv [email or web address] [phone]

## 2023-07-16 NOTE — Progress Notes (Signed)
  Echocardiogram 2D Echocardiogram has been performed.  Fain Home RDCS 07/16/2023, 10:26 AM

## 2023-07-16 NOTE — TOC CM/SW Note (Signed)
 Transition of Care [facility]) - Inpatient Brief Assessment   Patient Details  Name: [name] MRN: [id number] Date of Birth: 05-Jun-1940  Transition of Care [facility]) CM/SW Contact:    Jennett Model, RN Phone Number: 07/16/2023, 11:54 AM   Clinical Narrative: From home alone, has PCP and insurance on file, states has no HH services in place at this time , has walker and a w/chair at home.  States daughter  will transport them home at [employer] and family is support system, states gets medications from [facility] in [place].  Pta self ambulatory with walker.   Transition of Care Asessment: Insurance and Status: Insurance coverage has been reviewed Patient has primary care physician: Yes Home environment has been reviewed: home alone Prior level of function:: ambulatory with walker, has a w/chair also Prior/Current Home Services: Current home services (walker, w/chair) Social Drivers of Health Review: SDOH reviewed no interventions necessary Readmission risk has been reviewed: Yes Transition of care needs: no transition of care needs at this time

## 2023-07-17 ENCOUNTER — Ambulatory Visit: Payer: Medicare HMO

## 2023-07-17 ENCOUNTER — Telehealth: Payer: Self-pay

## 2023-07-17 NOTE — Transitions of Care (Post Inpatient/ED Visit) (Signed)
 07/17/2023  Name: [name] MRN: [id number] DOB: 1940/10/17  Today's TOC FU Call Status: Today's TOC FU Call Status:: Successful TOC FU Call Completed TOC FU Call Complete Date: 07/17/23 Patient's Name and Date of Birth confirmed.  Transition Care Management Follow-up Telephone Call Date of Discharge: 07/16/23 Discharge Facility: [facility]) Type of Discharge: Emergency Department Reason for ED Visit: Other: (chest pain) How have you been since you were released from the hospital?: Better Any questions or concerns?: No  Items Reviewed: Did you receive and understand the discharge instructions provided?: Yes Medications obtained,verified, and reconciled?: Yes (Medications Reviewed) Any new allergies since your discharge?: No Dietary orders reviewed?: Yes Do you have support at home?: Yes People in Home [RPT]: child(ren), adult  Medications Reviewed Today: Medications Reviewed Today     Reviewed by Darrall Ellison, LPN (Licensed Practical Nurse) on 07/17/23 at 1200  Med List Status: <None>   Medication Order Taking? Sig Documenting Provider Last Dose Status Informant  acetaminophen  (TYLENOL ) 325 MG tablet [id number] No Take 650 mg by mouth every 4 (four) hours as needed. [provider] 07/14/2023 Morning Active Child, Pharmacy Records  amLODipine  (NORVASC ) 5 MG tablet [id number] No Take 1 tablet (5 mg total) by mouth daily. Sirivol, Mamatha, MD 07/14/2023 Morning Active Child, Pharmacy Records  Cholecalciferol  1.25 MG (50000 UT) capsule [id number] No Take 50,000 Units by mouth once a week. Take one capsule at bedtime on Tuesday evening. [provider] Past Week Active Child, Pharmacy Records  clopidogrel  (PLAVIX ) 75 MG tablet [id number] No Take 1 tablet (75 mg total) by mouth daily.  Patient taking differently: Take 75 mg by mouth at bedtime.   Sirivol, Mamatha, MD Past Week Active Child, Pharmacy Records  escitalopram  (LEXAPRO ) 10 MG tablet [id number] No  TAKE 1 TABLET BY MOUTH EVERY DAY Sirivol, Mamatha, MD 07/14/2023 Morning Active Child, Pharmacy Records  Evolocumab  (REPATHA  SURECLICK) 140 MG/ML SOAJ [id number] No Inject 140 mg into the skin every 14 (fourteen) days. Sirivol, Mamatha, MD Unknown Active Child, Pharmacy Records  famotidine  (PEPCID ) 20 MG tablet [id number] No Take 1 tablet (20 mg total) by mouth 2 (two) times daily. Sirivol, Mamatha, MD 07/14/2023 Morning Active Child, Pharmacy Records  furosemide  (LASIX ) 20 MG tablet [id number] No Take 1 tablet (20 mg total) by mouth daily as needed for edema. Sirivol, Mamatha, MD 07/14/2023 Noon Active Child, Pharmacy Records  latanoprost  (XALATAN ) 0.005 % ophthalmic solution [id number] No Place 1 drop into both eyes at bedtime.  Patient not taking: Reported on 07/15/2023   [provider] Not Taking Active Child, Pharmacy Records           Med Note (LEE, NICOLE   Tue Jul 15, 2023  4:29 AM) Needs follow up appt with eye doctor  loratadine  (CLARITIN ) 10 MG tablet [id number] No Take 10 mg by mouth at bedtime. [provider] Past Week Active Child, Pharmacy Records  Multiple Vitamins-Minerals (PRESERVISION AREDS) CAPS [id number] No Take 1 capsule by mouth 2 (two) times daily. [provider] 07/14/2023 Morning Active Child, Pharmacy Records  OVER THE COUNTER MEDICATION [id number] No Apply 1 application. topically daily as needed (knee pain). Hempvana pain relief cream [provider] Past Week Active Child, Pharmacy Records  pantoprazole  (PROTONIX ) 40 MG tablet [id number]  Take 1 tablet (40 mg total) by mouth daily. Krishnan, Gokul, MD  Active   valsartan  (DIOVAN ) 80 MG tablet [id number] No Take 2 tablets (160 mg total) by mouth daily. If systolic BP greater than 140 mmHG give 160  mg by mouth daily, if systolic BP between 120-140 mmHG give 80 mg by mouth daily  Patient taking differently: Take 80 mg by mouth daily. If systolic BP greater than 140 mmHG give 80 mg by mouth daily.    Sirivol, Mamatha, MD Past Week Active Child, Pharmacy Records  vitamin B-12 (CYANOCOBALAMIN ) 1000 MCG tablet [id number] No Take 1,000 mcg by mouth every evening. [provider] Unknown Active Child, Pharmacy Records           Med Note (LEE, NICOLE   Tue Jul 15, 2023  4:33 AM) Medication on hold at this time.             Home Care and Equipment/Supplies: Were Home Health Services Ordered?: Yes Name of Home Health Agency:: [facility] Has Agency set up a time to come to your home?: No Any new equipment or medical supplies ordered?: NA  Functional Questionnaire: Do you need assistance with bathing/showering or dressing?: Yes Do you need assistance with meal preparation?: Yes Do you need assistance with eating?: No Do you have difficulty maintaining continence: Yes Do you need assistance with getting out of bed/getting out of a chair/moving?: No Do you have difficulty managing or taking your medications?: Yes  Follow up appointments reviewed: PCP Follow-up appointment confirmed?: Yes Date of PCP follow-up appointment?: 07/23/23 (patient already had this appt, daughter insisted keeping and not scheduling f/u) Follow-up Provider: Sirivol Specialist Hospital Follow-up appointment confirmed?: No Reason Specialist Follow-Up Not Confirmed: Patient has Specialist Provider Number and will Call for Appointment Do you need transportation to your follow-up appointment?: No Do you understand care options if your condition(s) worsen?: Yes-patient verbalized understanding    SIGNATURE Darrall Ellison, LPN [facility] Nurse Health Advisor Direct Dial [phone]

## 2023-07-18 DIAGNOSIS — I739 Peripheral vascular disease, unspecified: Secondary | ICD-10-CM | POA: Diagnosis not present

## 2023-07-18 DIAGNOSIS — I2510 Atherosclerotic heart disease of native coronary artery without angina pectoris: Secondary | ICD-10-CM | POA: Diagnosis not present

## 2023-07-18 DIAGNOSIS — K219 Gastro-esophageal reflux disease without esophagitis: Secondary | ICD-10-CM | POA: Diagnosis not present

## 2023-07-18 DIAGNOSIS — N179 Acute kidney failure, unspecified: Secondary | ICD-10-CM | POA: Diagnosis not present

## 2023-07-18 DIAGNOSIS — K449 Diaphragmatic hernia without obstruction or gangrene: Secondary | ICD-10-CM | POA: Diagnosis not present

## 2023-07-18 DIAGNOSIS — N1831 Chronic kidney disease, stage 3a: Secondary | ICD-10-CM | POA: Diagnosis not present

## 2023-07-18 DIAGNOSIS — Z604 Social exclusion and rejection: Secondary | ICD-10-CM | POA: Diagnosis not present

## 2023-07-18 DIAGNOSIS — Z9181 History of falling: Secondary | ICD-10-CM | POA: Diagnosis not present

## 2023-07-18 DIAGNOSIS — I69351 Hemiplegia and hemiparesis following cerebral infarction affecting right dominant side: Secondary | ICD-10-CM | POA: Diagnosis not present

## 2023-07-18 DIAGNOSIS — R32 Unspecified urinary incontinence: Secondary | ICD-10-CM | POA: Diagnosis not present

## 2023-07-18 DIAGNOSIS — F5101 Primary insomnia: Secondary | ICD-10-CM | POA: Diagnosis not present

## 2023-07-18 DIAGNOSIS — F322 Major depressive disorder, single episode, severe without psychotic features: Secondary | ICD-10-CM | POA: Diagnosis not present

## 2023-07-18 DIAGNOSIS — I447 Left bundle-branch block, unspecified: Secondary | ICD-10-CM | POA: Diagnosis not present

## 2023-07-18 DIAGNOSIS — H409 Unspecified glaucoma: Secondary | ICD-10-CM | POA: Diagnosis not present

## 2023-07-18 DIAGNOSIS — Z556 Problems related to health literacy: Secondary | ICD-10-CM | POA: Diagnosis not present

## 2023-07-18 DIAGNOSIS — I69322 Dysarthria following cerebral infarction: Secondary | ICD-10-CM | POA: Diagnosis not present

## 2023-07-18 DIAGNOSIS — Z7902 Long term (current) use of antithrombotics/antiplatelets: Secondary | ICD-10-CM | POA: Diagnosis not present

## 2023-07-18 DIAGNOSIS — E782 Mixed hyperlipidemia: Secondary | ICD-10-CM | POA: Diagnosis not present

## 2023-07-18 DIAGNOSIS — I509 Heart failure, unspecified: Secondary | ICD-10-CM | POA: Diagnosis not present

## 2023-07-18 DIAGNOSIS — I130 Hypertensive heart and chronic kidney disease with heart failure and stage 1 through stage 4 chronic kidney disease, or unspecified chronic kidney disease: Secondary | ICD-10-CM | POA: Diagnosis not present

## 2023-07-18 DIAGNOSIS — H8110 Benign paroxysmal vertigo, unspecified ear: Secondary | ICD-10-CM | POA: Diagnosis not present

## 2023-07-18 DIAGNOSIS — Z96649 Presence of unspecified artificial hip joint: Secondary | ICD-10-CM | POA: Diagnosis not present

## 2023-07-23 ENCOUNTER — Ambulatory Visit

## 2023-07-25 NOTE — Telephone Encounter (Signed)
 Spoke with [name] and let her know the below is okay to start. She verbalized understanding and stated they would get it started.  Copied from CRM [id number]. Topic: Clinical - Home Health Verbal Orders >> Jul 25, 2023 11:19 AM [name] wrote: Caller/Agency: [facility]/ [facility] Callback Number: [phone], okay to leave secure voicemail Service Requested: Physical Therapy Frequency: 2w3, 1w4,  Any new concerns about the patient? Would also like to add occupation therapy evaluation.

## 2023-07-28 ENCOUNTER — Ambulatory Visit

## 2023-07-29 ENCOUNTER — Ambulatory Visit (INDEPENDENT_AMBULATORY_CARE_PROVIDER_SITE_OTHER)

## 2023-07-29 VITALS — BP 152/88 | HR 64 | Temp 97.5°F | Ht 60.0 in | Wt 152.3 lb

## 2023-07-29 DIAGNOSIS — Z09 Encounter for follow-up examination after completed treatment for conditions other than malignant neoplasm: Secondary | ICD-10-CM | POA: Insufficient documentation

## 2023-07-29 DIAGNOSIS — I69351 Hemiplegia and hemiparesis following cerebral infarction affecting right dominant side: Secondary | ICD-10-CM | POA: Diagnosis not present

## 2023-07-29 DIAGNOSIS — M79604 Pain in right leg: Secondary | ICD-10-CM | POA: Diagnosis not present

## 2023-07-29 DIAGNOSIS — F331 Major depressive disorder, recurrent, moderate: Secondary | ICD-10-CM | POA: Diagnosis not present

## 2023-07-29 DIAGNOSIS — N1831 Chronic kidney disease, stage 3a: Secondary | ICD-10-CM

## 2023-07-29 DIAGNOSIS — G8929 Other chronic pain: Secondary | ICD-10-CM | POA: Diagnosis not present

## 2023-07-29 DIAGNOSIS — M5441 Lumbago with sciatica, right side: Secondary | ICD-10-CM

## 2023-07-29 NOTE — Assessment & Plan Note (Signed)
 Chronic kidney disease stage 3. Avoidance of NSAIDs recommended due to potential kidney damage. Discussed risks of NSAIDs and alternative pain management strategies.

## 2023-07-29 NOTE — Patient Instructions (Addendum)
  VISIT SUMMARY: Today, we addressed your right leg pain, recent hospitalization for chest pain, and ongoing health conditions including hypertension, chronic kidney disease, hiatal hernia, and depression. We discussed your current medications and made plans for physical and occupational therapy to help with your mobility and pain management.  YOUR PLAN: RIGHT LEG PAIN: You have severe pain in your right leg, especially when standing or walking. This may be due to arthritis, bursitis, or a pinched nerve. -Continue taking Tylenol  as needed for pain relief. -Start physical and occupational therapy. -Consider getting an x-ray of your right hip and lower back if the pain continues.  STROKE WITH RIGHT-SIDED WEAKNESS: You have weakness on your right side due to a previous stroke. -Start physical therapy to help with your mobility. -Start occupational therapy to improve your daily functioning.  HYPERTENSION: Your high blood pressure is being managed with your current medications. -Continue taking amlodipine  5 mg daily. -Continue taking Diovan  80 mg daily.  CHRONIC KIDNEY DISEASE STAGE 3: You have stage 3 chronic kidney disease. -Avoid using NSAIDs for pain relief as they can harm your kidneys.  HIATAL HERNIA: You have a large hiatal hernia causing chest pain. -Continue taking Protonix  40 mg daily. -Use Pepcid  20 mg twice daily as needed.  DEPRESSION: Your depression is being managed with Lexapro . -Continue taking Lexapro  10 mg daily.   Contains text generated by [employer].   contains text generated by [employer]bridge.

## 2023-07-29 NOTE — Progress Notes (Signed)
 Subjective:  Patient ID: [name], female    DOB: [birth year]  Age: 83 y.o. MRN: [id number]  Chief Complaint  Patient presents with   Hospitalization Follow-up    HPI: Discussed the use of AI scribe software for clinical note transcription with the patient, who gave verbal consent to proceed.  History of Present Illness   [name] is an 83 year old female with a history of hiatal hernia and prior stroke who presents with right leg pain and recent hospitalization for chest pain. she is accompanied by her daughter who is also a primary historian.   She was hospitalized from April 21 to April 23 due to chest pain experienced at home after a meal. Emergency services were called, and she was administered a loading dose of aspirin . Her initial EKG showed no new changes, showed chronic LBBB and her troponin levels were elevated but not significantly. She was treated with Protonix  and Maalox, which alleviated her symptoms. The chest pain was attributed to her hiatal hernia. She continues to take Protonix  and famotidine .  She experiences severe pain in her right leg,  she had a LEFT hip replacement history. An ultrasound was performed to rule out blood clots reportedly in the hospital, and none were found. Despite this, she continues to experience excruciating pain, particularly when standing or walking. She has a history of weakness on her right side due to a prior stroke, which may contribute to her current symptoms. Pain radiates from her right buttock down her leg, exacerbated by standing or walking. No recent falls. Tylenol  provides some relief, and she has been taking 500 mg as needed.  Her current medications include amlodipine  5 mg daily, Diovan  80 mg daily, Lasix  20 mg as needed, Plavix  daily, Lexapro  10 mg nightly, and Protonix  40 mg daily. She also takes Pepcid  20 mg twice daily as needed. She has not been able to administer her Repatha  injections due to time constraints, although  there are refills available.  She had mild anemia with a hemoglobin of 11.6, but her blood counts were normal during her recent hospitalization. She has chronic kidney disease stage 3         07/29/2023   11:03 AM 05/27/2023   11:15 AM 05/27/2023   11:14 AM 11/15/2022   10:56 AM 04/04/2022   11:51 AM  Depression screen PHQ 2/9  Decreased Interest 0 0 0 0 2  Down, Depressed, Hopeless 0 0 0 0 0  PHQ - 2 Score 0 0 0 0 2  Altered sleeping    2 1  Tired, decreased energy    3 2  Change in appetite    0 1  Feeling bad or failure about yourself     0 1  Trouble concentrating    1 2  Moving slowly or fidgety/restless    0 1  Suicidal thoughts    0 0  PHQ-9 Score    6 10  Difficult doing work/chores    Not difficult at all Somewhat difficult        07/29/2023   11:02 AM  Fall Risk   Falls in the past year? 1  Number falls in past yr: 1  Injury with Fall? 0  Risk for fall due to : History of fall(s)    Patient Care Team: Kathaleya Mcduffee, MD as PCP - General (Family Medicine) Loyde Rule, MD as PCP - Cardiology (Cardiology) Loyde Rule, MD as Consulting Physician (Cardiology) Devon Fogo,  RPH (Inactive) (Pharmacist)   Review of Systems  Constitutional:  Negative for chills, fatigue and fever.  HENT:  Negative for congestion, ear pain, sinus pressure and sore throat.   Respiratory:  Negative for cough and shortness of breath.   Cardiovascular:  Negative for chest pain.  Gastrointestinal:  Negative for abdominal pain, constipation, diarrhea, nausea and vomiting.  Genitourinary:  Negative for dysuria and frequency.  Musculoskeletal:  Positive for arthralgias, gait problem and myalgias. Negative for back pain.  Neurological:  Negative for dizziness and headaches.  Psychiatric/Behavioral:  Negative for dysphoric mood. The patient is not nervous/anxious.     Current Outpatient Medications on File Prior to Visit  Medication Sig Dispense Refill   acetaminophen  (TYLENOL ) 325  MG tablet Take 650 mg by mouth every 4 (four) hours as needed.     amLODipine  (NORVASC ) 5 MG tablet Take 1 tablet (5 mg total) by mouth daily. 90 tablet 2   Cholecalciferol  1.25 MG (50000 UT) capsule Take 50,000 Units by mouth once a week. Take one capsule at bedtime on Tuesday evening.     clopidogrel  (PLAVIX ) 75 MG tablet Take 1 tablet (75 mg total) by mouth daily. (Patient taking differently: Take 75 mg by mouth at bedtime.) 90 tablet 1   escitalopram  (LEXAPRO ) 10 MG tablet TAKE 1 TABLET BY MOUTH EVERY DAY 90 tablet 1   Evolocumab  (REPATHA  SURECLICK) 140 MG/ML SOAJ Inject 140 mg into the skin every 14 (fourteen) days. 2 mL 11   famotidine  (PEPCID ) 20 MG tablet Take 1 tablet (20 mg total) by mouth 2 (two) times daily. 180 tablet 1   furosemide  (LASIX ) 20 MG tablet Take 1 tablet (20 mg total) by mouth daily as needed for edema. 90 tablet 1   loratadine  (CLARITIN ) 10 MG tablet Take 10 mg by mouth at bedtime.     Multiple Vitamin (MULTIVITAMIN ADULT PO) Take 1 tablet by mouth daily. Ired multivitamin     OVER THE COUNTER MEDICATION Apply 1 application. topically daily as needed (knee pain). Hempvana pain relief cream     pantoprazole  (PROTONIX ) 40 MG tablet Take 1 tablet (40 mg total) by mouth daily. 30 tablet 1   valsartan  (DIOVAN ) 80 MG tablet Take 2 tablets (160 mg total) by mouth daily. If systolic BP greater than 140 mmHG give 160 mg by mouth daily, if systolic BP between 120-140 mmHG give 80 mg by mouth daily (Patient taking differently: Take 80 mg by mouth daily. If systolic BP greater than 140 mmHG give 80 mg by mouth daily.) 180 tablet 0   vitamin B-12 (CYANOCOBALAMIN ) 1000 MCG tablet Take 1,000 mcg by mouth every evening.     latanoprost  (XALATAN ) 0.005 % ophthalmic solution Place 1 drop into both eyes at bedtime. (Patient not taking: Reported on 07/15/2023)     No current facility-administered medications on file prior to visit.   Past Medical History:  Diagnosis Date   Acute embolic  stroke (HCC)    Benign paroxysmal vertigo, unspecified ear 06/07/2019   CKD (chronic kidney disease), stage III (HCC)    GERD (gastroesophageal reflux disease)    Glaucoma    Incisional infection 12/25/2020   Past Surgical History:  Procedure Laterality Date   ABDOMINAL HYSTERECTOMY     BACK SURGERY     cataract surgery     COLONOSCOPY N/A 08/03/2015   Procedure: COLONOSCOPY;  Surgeon: Jolinda Necessary, MD;  Location[facility] ENDOSCOPY;  Service: Endoscopy;  Laterality: N/A;   ESOPHAGOGASTRODUODENOSCOPY N/A 07/31/2015   Procedure: ESOPHAGOGASTRODUODENOSCOPY (EGD);  Surgeon: Jolinda Necessary, MD;  Location: [facility] ENDOSCOPY;  Service: Endoscopy;  Laterality: N/A;   IR ANGIO INTRA EXTRACRAN SEL COM CAROTID INNOMINATE BILAT MOD SED  06/01/2021   IR ANGIO VERTEBRAL SEL SUBCLAVIAN INNOMINATE UNI L MOD SED  06/01/2021   IR RADIOLOGIST EVAL & MGMT  04/26/2021   OOPHORECTOMY     TOTAL HIP ARTHROPLASTY     URETER SURGERY      Family History  Problem Relation Age of Onset   Hypertension Mother    Hypertension Father    Heart attack Father    Hypertension Brother    Diabetes Brother    Social History   Socioeconomic History   Marital status: Married    Spouse name: Not on file   Number of children: Not on file   Years of education: Not on file   Highest education level: Not on file  Occupational History   Not on file  Tobacco Use   Smoking status: Never   Smokeless tobacco: Never  Vaping Use   Vaping status: Never Used  Substance and Sexual Activity   Alcohol use: No   Drug use: No   Sexual activity: Not Currently  Other Topics Concern   Not on file  Social History Narrative   Not on file   Social Drivers of Health   Financial Resource Strain: Low Risk  (11/06/2021)   Overall Financial Resource Strain (CARDIA)    Difficulty of Paying Living Expenses: Not hard at all  Food Insecurity: No Food Insecurity (07/15/2023)   Hunger Vital Sign    Worried About Running Out of Food in the Last Year:  Never true    Ran Out of Food in the Last Year: Never true  Transportation Needs: No Transportation Needs (07/15/2023)   PRAPARE - Administrator, Civil Service (Medical): No    Lack of Transportation (Non-Medical): No  Physical Activity: Inactive (05/27/2023)   Exercise Vital Sign    Days of Exercise per Week: 0 days    Minutes of Exercise per Session: 0 min  Stress: No Stress Concern Present (05/27/2023)   [employer] of Occupational Health - Occupational Stress Questionnaire    Feeling of Stress : Not at all  Social Connections: Moderately Isolated (07/15/2023)   Social Connection and Isolation Panel [NHANES]    Frequency of Communication with Friends and Family: More than three times a week    Frequency of Social Gatherings with Friends and Family: More than three times a week    Attends Religious Services: 1 to 4 times per year    Active Member of [employer] or Organizations: No    Attends Banker Meetings: Never    Marital Status: Widowed    Objective:  BP (!) 152/88   Pulse 64   Temp (!) 97.5 F (36.4 C)   Ht 5' (1.524 m)   Wt 152 lb 5.4 oz (69.1 kg)   SpO2 98%   BMI 29.75 kg/m      07/29/2023   10:58 AM 07/16/2023    3:28 PM 07/16/2023   11:30 AM  BP/Weight  Systolic BP 152 161 113  Diastolic BP 88 57 71  Wt. (Lbs) 152.34    BMI 29.75 kg/m2      Physical Exam Vitals and nursing note reviewed.  Constitutional:      Comments: Sitting in wheelchair, needs assistance standing up.  HENT:     Head: Normocephalic and atraumatic.     Mouth/Throat:  Mouth: Mucous membranes are moist.  Cardiovascular:     Rate and Rhythm: Normal rate and regular rhythm.     Heart sounds: Murmur heard.  Pulmonary:     Effort: Pulmonary effort is normal.     Breath sounds: Normal breath sounds.  Abdominal:     General: Bowel sounds are normal.  Musculoskeletal:        General: Tenderness present.  Skin:    General: Skin is warm.  Neurological:      Mental Status: She is alert and oriented to person, place, and time.  Psychiatric:        Mood and Affect: Mood normal.   MUSCULOSKELETAL: No tenderness in upper and lower back, lumbar and RIGHT SI joint tenderness, right buttock tenderness, right trochanteric bursa tenderness. NEUROLOGICAL: weakness in right upper and lower extremities. 3/5 Results   Diabetic Foot Exam - Simple   No data filed      Lab Results  Component Value Date   WBC 6.8 07/15/2023   HGB 11.6 (L) 07/15/2023   HCT 35.8 (L) 07/15/2023   PLT 249 07/15/2023   GLUCOSE 111 (H) 07/15/2023   CHOL 230 (H) 07/15/2023   TRIG 178 (H) 07/15/2023   HDL 34 (L) 07/15/2023   LDLCALC 160 (H) 07/15/2023   ALT 13 07/15/2023   AST 14 (L) 07/15/2023   NA 142 07/15/2023   K 3.9 07/15/2023   CL 107 07/15/2023   CREATININE 1.19 (H) 07/15/2023   BUN 18 07/15/2023   CO2 25 07/15/2023   TSH 1.920 11/15/2022   INR 1.1 08/24/2022   HGBA1C 5.2 07/15/2023      Assessment & Plan:  Hospital discharge follow-up Assessment & Plan: Recent hospitalization from 07/14/23 till 07/16/23 for chest pain, cardiac origin ruled out with testing. Noted to have Large hiatal hernia. Protonix  40 mg daily and Pepcid  20 mg twice daily as needed for symptom management. Surgical intervention not recommended due to her age and condition, as surgery is too invasive given her current health status. 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 Physical and occupational therapy recommended to address weakness and improve mobility. Home health services approved for therapy initiation. - Initiate physical therapy - Initiate occupational therapy   Moderate recurrent major depression (HCC) Assessment & Plan: Depression managed with Lexapro  10 mg daily. - Continue Lexapro   10 mg daily    Chronic kidney disease, stage 3a (HCC) Assessment & Plan: Chronic kidney disease stage 3. Avoidance of NSAIDs recommended due to potential kidney damage. Discussed risks of NSAIDs and alternative pain management strategies.   Lower extremity pain, right Assessment & Plan: Right leg pain with tenderness in the lumbar region, SI joint, and right buttock. Differential includes arthritis, bursitis, or pinched nerve. Pain exacerbated by standing and walking. Tylenol  provides some relief. Muscle relaxants discussed but not prescribed due to potential fall risk and kidney concerns. - Continue Tylenol  as needed for pain - Initiate physical and occupational therapy - Consider x-ray of right hip and low back if pain persists   Chronic right-sided low back pain with right-sided sciatica Assessment & Plan:  Continue Tylenol  as needed for pain - Initiate physical and occupational therapy - Consider x-ray of right hip and low back if pain persists, but defer at this time.  To come back for trochanteric bursa  injection if she is lot of pain       No orders of the defined types were placed in this encounter.   No orders of the defined types were placed in this encounter.  Assessment and Plan           Follow-up: Return in about 3 months (around 10/29/2023) for chronic disease follow up.     An After Visit Summary was printed and given to the patient.  Bristol Osentoski, MD [facility] [phone]

## 2023-07-29 NOTE — Assessment & Plan Note (Signed)
 Residual right-sided weakness post-prior stroke. Physical and occupational therapy recommended to address weakness and improve mobility. Home health services approved for therapy initiation. - Initiate physical therapy - Initiate occupational therapy

## 2023-07-29 NOTE — Telephone Encounter (Signed)
 Copied from CRM [id number]. Topic: Clinical - Medication Question >> Jul 29, 2023  1:14 PM Everette C wrote: Reason for CRM: The patient's daughter has called back, as directed by practice staff, to provide an additional bp reading   176/75 heart rate 74 Taken today T 1:15 pm 07/29/23

## 2023-07-29 NOTE — Assessment & Plan Note (Signed)
 Right leg pain with tenderness in the lumbar region, SI joint, and right buttock. Differential includes arthritis, bursitis, or pinched nerve. Pain exacerbated by standing and walking. Tylenol  provides some relief. Muscle relaxants discussed but not prescribed due to potential fall risk and kidney concerns. - Continue Tylenol  as needed for pain - Initiate physical and occupational therapy - Consider x-ray of right hip and low back if pain persists

## 2023-07-29 NOTE — Assessment & Plan Note (Signed)
 Recent hospitalization from 07/14/23 till 07/16/23 for chest pain, cardiac origin ruled out with testing. Noted to have Large hiatal hernia. Protonix  40 mg daily and Pepcid  20 mg twice daily as needed for symptom management. Surgical intervention not recommended due to her age and condition, as surgery is too invasive given her current health status. - Continue Protonix  40 mg daily - Use Pepcid  20 mg twice daily as needed

## 2023-07-29 NOTE — Assessment & Plan Note (Signed)
 Depression managed with Lexapro  10 mg daily. - Continue Lexapro  10 mg daily

## 2023-07-29 NOTE — Assessment & Plan Note (Signed)
 Continue Tylenol  as needed for pain - Initiate physical and occupational therapy - Consider x-ray of right hip and low back if pain persists, but defer at this time.  To come back for trochanteric bursa injection if she is lot of pain

## 2023-07-30 DIAGNOSIS — I509 Heart failure, unspecified: Secondary | ICD-10-CM | POA: Diagnosis not present

## 2023-07-30 DIAGNOSIS — K219 Gastro-esophageal reflux disease without esophagitis: Secondary | ICD-10-CM | POA: Diagnosis not present

## 2023-07-30 DIAGNOSIS — Z96649 Presence of unspecified artificial hip joint: Secondary | ICD-10-CM | POA: Diagnosis not present

## 2023-07-30 DIAGNOSIS — Z7902 Long term (current) use of antithrombotics/antiplatelets: Secondary | ICD-10-CM | POA: Diagnosis not present

## 2023-07-30 DIAGNOSIS — R32 Unspecified urinary incontinence: Secondary | ICD-10-CM | POA: Diagnosis not present

## 2023-07-30 DIAGNOSIS — F322 Major depressive disorder, single episode, severe without psychotic features: Secondary | ICD-10-CM | POA: Diagnosis not present

## 2023-07-30 DIAGNOSIS — I739 Peripheral vascular disease, unspecified: Secondary | ICD-10-CM | POA: Diagnosis not present

## 2023-07-30 DIAGNOSIS — I69322 Dysarthria following cerebral infarction: Secondary | ICD-10-CM | POA: Diagnosis not present

## 2023-07-30 DIAGNOSIS — N179 Acute kidney failure, unspecified: Secondary | ICD-10-CM | POA: Diagnosis not present

## 2023-07-30 DIAGNOSIS — I130 Hypertensive heart and chronic kidney disease with heart failure and stage 1 through stage 4 chronic kidney disease, or unspecified chronic kidney disease: Secondary | ICD-10-CM | POA: Diagnosis not present

## 2023-07-30 DIAGNOSIS — F5101 Primary insomnia: Secondary | ICD-10-CM | POA: Diagnosis not present

## 2023-07-30 DIAGNOSIS — H8110 Benign paroxysmal vertigo, unspecified ear: Secondary | ICD-10-CM | POA: Diagnosis not present

## 2023-07-30 DIAGNOSIS — I447 Left bundle-branch block, unspecified: Secondary | ICD-10-CM | POA: Diagnosis not present

## 2023-07-30 DIAGNOSIS — Z556 Problems related to health literacy: Secondary | ICD-10-CM | POA: Diagnosis not present

## 2023-07-30 DIAGNOSIS — N1831 Chronic kidney disease, stage 3a: Secondary | ICD-10-CM | POA: Diagnosis not present

## 2023-07-30 DIAGNOSIS — Z9181 History of falling: Secondary | ICD-10-CM | POA: Diagnosis not present

## 2023-07-30 DIAGNOSIS — E782 Mixed hyperlipidemia: Secondary | ICD-10-CM | POA: Diagnosis not present

## 2023-07-30 DIAGNOSIS — K449 Diaphragmatic hernia without obstruction or gangrene: Secondary | ICD-10-CM | POA: Diagnosis not present

## 2023-07-30 DIAGNOSIS — I2510 Atherosclerotic heart disease of native coronary artery without angina pectoris: Secondary | ICD-10-CM | POA: Diagnosis not present

## 2023-07-30 DIAGNOSIS — I69351 Hemiplegia and hemiparesis following cerebral infarction affecting right dominant side: Secondary | ICD-10-CM | POA: Diagnosis not present

## 2023-07-30 DIAGNOSIS — Z604 Social exclusion and rejection: Secondary | ICD-10-CM | POA: Diagnosis not present

## 2023-07-30 DIAGNOSIS — H409 Unspecified glaucoma: Secondary | ICD-10-CM | POA: Diagnosis not present

## 2023-08-01 DIAGNOSIS — I69322 Dysarthria following cerebral infarction: Secondary | ICD-10-CM | POA: Diagnosis not present

## 2023-08-01 DIAGNOSIS — I509 Heart failure, unspecified: Secondary | ICD-10-CM | POA: Diagnosis not present

## 2023-08-01 DIAGNOSIS — Z556 Problems related to health literacy: Secondary | ICD-10-CM | POA: Diagnosis not present

## 2023-08-01 DIAGNOSIS — R32 Unspecified urinary incontinence: Secondary | ICD-10-CM | POA: Diagnosis not present

## 2023-08-01 DIAGNOSIS — Z7902 Long term (current) use of antithrombotics/antiplatelets: Secondary | ICD-10-CM | POA: Diagnosis not present

## 2023-08-01 DIAGNOSIS — N179 Acute kidney failure, unspecified: Secondary | ICD-10-CM | POA: Diagnosis not present

## 2023-08-01 DIAGNOSIS — Z96649 Presence of unspecified artificial hip joint: Secondary | ICD-10-CM | POA: Diagnosis not present

## 2023-08-01 DIAGNOSIS — Z9181 History of falling: Secondary | ICD-10-CM | POA: Diagnosis not present

## 2023-08-01 DIAGNOSIS — H8110 Benign paroxysmal vertigo, unspecified ear: Secondary | ICD-10-CM | POA: Diagnosis not present

## 2023-08-01 DIAGNOSIS — I739 Peripheral vascular disease, unspecified: Secondary | ICD-10-CM | POA: Diagnosis not present

## 2023-08-01 DIAGNOSIS — F322 Major depressive disorder, single episode, severe without psychotic features: Secondary | ICD-10-CM | POA: Diagnosis not present

## 2023-08-01 DIAGNOSIS — K449 Diaphragmatic hernia without obstruction or gangrene: Secondary | ICD-10-CM | POA: Diagnosis not present

## 2023-08-01 DIAGNOSIS — I447 Left bundle-branch block, unspecified: Secondary | ICD-10-CM | POA: Diagnosis not present

## 2023-08-01 DIAGNOSIS — E782 Mixed hyperlipidemia: Secondary | ICD-10-CM | POA: Diagnosis not present

## 2023-08-01 DIAGNOSIS — N1831 Chronic kidney disease, stage 3a: Secondary | ICD-10-CM | POA: Diagnosis not present

## 2023-08-01 DIAGNOSIS — H409 Unspecified glaucoma: Secondary | ICD-10-CM | POA: Diagnosis not present

## 2023-08-01 DIAGNOSIS — I130 Hypertensive heart and chronic kidney disease with heart failure and stage 1 through stage 4 chronic kidney disease, or unspecified chronic kidney disease: Secondary | ICD-10-CM | POA: Diagnosis not present

## 2023-08-01 DIAGNOSIS — F5101 Primary insomnia: Secondary | ICD-10-CM | POA: Diagnosis not present

## 2023-08-01 DIAGNOSIS — I69351 Hemiplegia and hemiparesis following cerebral infarction affecting right dominant side: Secondary | ICD-10-CM | POA: Diagnosis not present

## 2023-08-01 DIAGNOSIS — K219 Gastro-esophageal reflux disease without esophagitis: Secondary | ICD-10-CM | POA: Diagnosis not present

## 2023-08-01 DIAGNOSIS — I2510 Atherosclerotic heart disease of native coronary artery without angina pectoris: Secondary | ICD-10-CM | POA: Diagnosis not present

## 2023-08-01 DIAGNOSIS — Z604 Social exclusion and rejection: Secondary | ICD-10-CM | POA: Diagnosis not present

## 2023-08-06 DIAGNOSIS — I2510 Atherosclerotic heart disease of native coronary artery without angina pectoris: Secondary | ICD-10-CM | POA: Diagnosis not present

## 2023-08-06 DIAGNOSIS — F5101 Primary insomnia: Secondary | ICD-10-CM | POA: Diagnosis not present

## 2023-08-06 DIAGNOSIS — I130 Hypertensive heart and chronic kidney disease with heart failure and stage 1 through stage 4 chronic kidney disease, or unspecified chronic kidney disease: Secondary | ICD-10-CM | POA: Diagnosis not present

## 2023-08-06 DIAGNOSIS — H409 Unspecified glaucoma: Secondary | ICD-10-CM | POA: Diagnosis not present

## 2023-08-06 DIAGNOSIS — I739 Peripheral vascular disease, unspecified: Secondary | ICD-10-CM | POA: Diagnosis not present

## 2023-08-06 DIAGNOSIS — F322 Major depressive disorder, single episode, severe without psychotic features: Secondary | ICD-10-CM | POA: Diagnosis not present

## 2023-08-06 DIAGNOSIS — H8110 Benign paroxysmal vertigo, unspecified ear: Secondary | ICD-10-CM | POA: Diagnosis not present

## 2023-08-06 DIAGNOSIS — I447 Left bundle-branch block, unspecified: Secondary | ICD-10-CM | POA: Diagnosis not present

## 2023-08-06 DIAGNOSIS — Z556 Problems related to health literacy: Secondary | ICD-10-CM | POA: Diagnosis not present

## 2023-08-06 DIAGNOSIS — I69322 Dysarthria following cerebral infarction: Secondary | ICD-10-CM | POA: Diagnosis not present

## 2023-08-06 DIAGNOSIS — Z96649 Presence of unspecified artificial hip joint: Secondary | ICD-10-CM | POA: Diagnosis not present

## 2023-08-06 DIAGNOSIS — N179 Acute kidney failure, unspecified: Secondary | ICD-10-CM | POA: Diagnosis not present

## 2023-08-06 DIAGNOSIS — I69351 Hemiplegia and hemiparesis following cerebral infarction affecting right dominant side: Secondary | ICD-10-CM | POA: Diagnosis not present

## 2023-08-06 DIAGNOSIS — Z9181 History of falling: Secondary | ICD-10-CM | POA: Diagnosis not present

## 2023-08-06 DIAGNOSIS — K449 Diaphragmatic hernia without obstruction or gangrene: Secondary | ICD-10-CM | POA: Diagnosis not present

## 2023-08-06 DIAGNOSIS — I509 Heart failure, unspecified: Secondary | ICD-10-CM | POA: Diagnosis not present

## 2023-08-06 DIAGNOSIS — K219 Gastro-esophageal reflux disease without esophagitis: Secondary | ICD-10-CM | POA: Diagnosis not present

## 2023-08-06 DIAGNOSIS — Z604 Social exclusion and rejection: Secondary | ICD-10-CM | POA: Diagnosis not present

## 2023-08-06 DIAGNOSIS — R32 Unspecified urinary incontinence: Secondary | ICD-10-CM | POA: Diagnosis not present

## 2023-08-06 DIAGNOSIS — N1831 Chronic kidney disease, stage 3a: Secondary | ICD-10-CM | POA: Diagnosis not present

## 2023-08-06 DIAGNOSIS — Z7902 Long term (current) use of antithrombotics/antiplatelets: Secondary | ICD-10-CM | POA: Diagnosis not present

## 2023-08-06 DIAGNOSIS — E782 Mixed hyperlipidemia: Secondary | ICD-10-CM | POA: Diagnosis not present

## 2023-08-13 DIAGNOSIS — I739 Peripheral vascular disease, unspecified: Secondary | ICD-10-CM | POA: Diagnosis not present

## 2023-08-13 DIAGNOSIS — K219 Gastro-esophageal reflux disease without esophagitis: Secondary | ICD-10-CM | POA: Diagnosis not present

## 2023-08-13 DIAGNOSIS — I2510 Atherosclerotic heart disease of native coronary artery without angina pectoris: Secondary | ICD-10-CM | POA: Diagnosis not present

## 2023-08-13 DIAGNOSIS — Z96649 Presence of unspecified artificial hip joint: Secondary | ICD-10-CM | POA: Diagnosis not present

## 2023-08-13 DIAGNOSIS — I447 Left bundle-branch block, unspecified: Secondary | ICD-10-CM | POA: Diagnosis not present

## 2023-08-13 DIAGNOSIS — E782 Mixed hyperlipidemia: Secondary | ICD-10-CM | POA: Diagnosis not present

## 2023-08-13 DIAGNOSIS — I130 Hypertensive heart and chronic kidney disease with heart failure and stage 1 through stage 4 chronic kidney disease, or unspecified chronic kidney disease: Secondary | ICD-10-CM | POA: Diagnosis not present

## 2023-08-13 DIAGNOSIS — I69351 Hemiplegia and hemiparesis following cerebral infarction affecting right dominant side: Secondary | ICD-10-CM | POA: Diagnosis not present

## 2023-08-13 DIAGNOSIS — I69322 Dysarthria following cerebral infarction: Secondary | ICD-10-CM | POA: Diagnosis not present

## 2023-08-13 DIAGNOSIS — Z9181 History of falling: Secondary | ICD-10-CM | POA: Diagnosis not present

## 2023-08-13 DIAGNOSIS — Z556 Problems related to health literacy: Secondary | ICD-10-CM | POA: Diagnosis not present

## 2023-08-13 DIAGNOSIS — F322 Major depressive disorder, single episode, severe without psychotic features: Secondary | ICD-10-CM | POA: Diagnosis not present

## 2023-08-13 DIAGNOSIS — N1831 Chronic kidney disease, stage 3a: Secondary | ICD-10-CM | POA: Diagnosis not present

## 2023-08-13 DIAGNOSIS — I509 Heart failure, unspecified: Secondary | ICD-10-CM | POA: Diagnosis not present

## 2023-08-13 DIAGNOSIS — H8110 Benign paroxysmal vertigo, unspecified ear: Secondary | ICD-10-CM | POA: Diagnosis not present

## 2023-08-13 DIAGNOSIS — F5101 Primary insomnia: Secondary | ICD-10-CM | POA: Diagnosis not present

## 2023-08-13 DIAGNOSIS — Z604 Social exclusion and rejection: Secondary | ICD-10-CM | POA: Diagnosis not present

## 2023-08-13 DIAGNOSIS — K449 Diaphragmatic hernia without obstruction or gangrene: Secondary | ICD-10-CM | POA: Diagnosis not present

## 2023-08-13 DIAGNOSIS — R32 Unspecified urinary incontinence: Secondary | ICD-10-CM | POA: Diagnosis not present

## 2023-08-13 DIAGNOSIS — Z7902 Long term (current) use of antithrombotics/antiplatelets: Secondary | ICD-10-CM | POA: Diagnosis not present

## 2023-08-13 DIAGNOSIS — N179 Acute kidney failure, unspecified: Secondary | ICD-10-CM | POA: Diagnosis not present

## 2023-08-13 DIAGNOSIS — H409 Unspecified glaucoma: Secondary | ICD-10-CM | POA: Diagnosis not present

## 2023-08-14 DIAGNOSIS — Z96649 Presence of unspecified artificial hip joint: Secondary | ICD-10-CM | POA: Diagnosis not present

## 2023-08-14 DIAGNOSIS — N179 Acute kidney failure, unspecified: Secondary | ICD-10-CM | POA: Diagnosis not present

## 2023-08-14 DIAGNOSIS — I739 Peripheral vascular disease, unspecified: Secondary | ICD-10-CM | POA: Diagnosis not present

## 2023-08-14 DIAGNOSIS — F322 Major depressive disorder, single episode, severe without psychotic features: Secondary | ICD-10-CM | POA: Diagnosis not present

## 2023-08-14 DIAGNOSIS — N1831 Chronic kidney disease, stage 3a: Secondary | ICD-10-CM | POA: Diagnosis not present

## 2023-08-14 DIAGNOSIS — H8110 Benign paroxysmal vertigo, unspecified ear: Secondary | ICD-10-CM | POA: Diagnosis not present

## 2023-08-14 DIAGNOSIS — Z7902 Long term (current) use of antithrombotics/antiplatelets: Secondary | ICD-10-CM | POA: Diagnosis not present

## 2023-08-14 DIAGNOSIS — I69351 Hemiplegia and hemiparesis following cerebral infarction affecting right dominant side: Secondary | ICD-10-CM | POA: Diagnosis not present

## 2023-08-14 DIAGNOSIS — K219 Gastro-esophageal reflux disease without esophagitis: Secondary | ICD-10-CM | POA: Diagnosis not present

## 2023-08-14 DIAGNOSIS — H409 Unspecified glaucoma: Secondary | ICD-10-CM | POA: Diagnosis not present

## 2023-08-14 DIAGNOSIS — Z556 Problems related to health literacy: Secondary | ICD-10-CM | POA: Diagnosis not present

## 2023-08-14 DIAGNOSIS — I509 Heart failure, unspecified: Secondary | ICD-10-CM | POA: Diagnosis not present

## 2023-08-14 DIAGNOSIS — Z604 Social exclusion and rejection: Secondary | ICD-10-CM | POA: Diagnosis not present

## 2023-08-14 DIAGNOSIS — Z9181 History of falling: Secondary | ICD-10-CM | POA: Diagnosis not present

## 2023-08-14 DIAGNOSIS — I69322 Dysarthria following cerebral infarction: Secondary | ICD-10-CM | POA: Diagnosis not present

## 2023-08-14 DIAGNOSIS — F5101 Primary insomnia: Secondary | ICD-10-CM | POA: Diagnosis not present

## 2023-08-14 DIAGNOSIS — R32 Unspecified urinary incontinence: Secondary | ICD-10-CM | POA: Diagnosis not present

## 2023-08-14 DIAGNOSIS — E782 Mixed hyperlipidemia: Secondary | ICD-10-CM | POA: Diagnosis not present

## 2023-08-14 DIAGNOSIS — K449 Diaphragmatic hernia without obstruction or gangrene: Secondary | ICD-10-CM | POA: Diagnosis not present

## 2023-08-14 DIAGNOSIS — I2510 Atherosclerotic heart disease of native coronary artery without angina pectoris: Secondary | ICD-10-CM | POA: Diagnosis not present

## 2023-08-14 DIAGNOSIS — I130 Hypertensive heart and chronic kidney disease with heart failure and stage 1 through stage 4 chronic kidney disease, or unspecified chronic kidney disease: Secondary | ICD-10-CM | POA: Diagnosis not present

## 2023-08-14 DIAGNOSIS — I447 Left bundle-branch block, unspecified: Secondary | ICD-10-CM | POA: Diagnosis not present

## 2023-08-22 DIAGNOSIS — N1831 Chronic kidney disease, stage 3a: Secondary | ICD-10-CM | POA: Diagnosis not present

## 2023-08-22 DIAGNOSIS — E782 Mixed hyperlipidemia: Secondary | ICD-10-CM | POA: Diagnosis not present

## 2023-08-22 DIAGNOSIS — Z96649 Presence of unspecified artificial hip joint: Secondary | ICD-10-CM | POA: Diagnosis not present

## 2023-08-22 DIAGNOSIS — Z7902 Long term (current) use of antithrombotics/antiplatelets: Secondary | ICD-10-CM | POA: Diagnosis not present

## 2023-08-22 DIAGNOSIS — I509 Heart failure, unspecified: Secondary | ICD-10-CM | POA: Diagnosis not present

## 2023-08-22 DIAGNOSIS — R32 Unspecified urinary incontinence: Secondary | ICD-10-CM | POA: Diagnosis not present

## 2023-08-22 DIAGNOSIS — N179 Acute kidney failure, unspecified: Secondary | ICD-10-CM | POA: Diagnosis not present

## 2023-08-22 DIAGNOSIS — K219 Gastro-esophageal reflux disease without esophagitis: Secondary | ICD-10-CM | POA: Diagnosis not present

## 2023-08-22 DIAGNOSIS — I2510 Atherosclerotic heart disease of native coronary artery without angina pectoris: Secondary | ICD-10-CM | POA: Diagnosis not present

## 2023-08-22 DIAGNOSIS — Z556 Problems related to health literacy: Secondary | ICD-10-CM | POA: Diagnosis not present

## 2023-08-22 DIAGNOSIS — H8110 Benign paroxysmal vertigo, unspecified ear: Secondary | ICD-10-CM | POA: Diagnosis not present

## 2023-08-22 DIAGNOSIS — I130 Hypertensive heart and chronic kidney disease with heart failure and stage 1 through stage 4 chronic kidney disease, or unspecified chronic kidney disease: Secondary | ICD-10-CM | POA: Diagnosis not present

## 2023-08-22 DIAGNOSIS — I69322 Dysarthria following cerebral infarction: Secondary | ICD-10-CM | POA: Diagnosis not present

## 2023-08-22 DIAGNOSIS — K449 Diaphragmatic hernia without obstruction or gangrene: Secondary | ICD-10-CM | POA: Diagnosis not present

## 2023-08-22 DIAGNOSIS — F5101 Primary insomnia: Secondary | ICD-10-CM | POA: Diagnosis not present

## 2023-08-22 DIAGNOSIS — F322 Major depressive disorder, single episode, severe without psychotic features: Secondary | ICD-10-CM | POA: Diagnosis not present

## 2023-08-22 DIAGNOSIS — Z9181 History of falling: Secondary | ICD-10-CM | POA: Diagnosis not present

## 2023-08-22 DIAGNOSIS — I739 Peripheral vascular disease, unspecified: Secondary | ICD-10-CM | POA: Diagnosis not present

## 2023-08-22 DIAGNOSIS — I447 Left bundle-branch block, unspecified: Secondary | ICD-10-CM | POA: Diagnosis not present

## 2023-08-22 DIAGNOSIS — H409 Unspecified glaucoma: Secondary | ICD-10-CM | POA: Diagnosis not present

## 2023-08-22 DIAGNOSIS — I69351 Hemiplegia and hemiparesis following cerebral infarction affecting right dominant side: Secondary | ICD-10-CM | POA: Diagnosis not present

## 2023-08-22 DIAGNOSIS — Z604 Social exclusion and rejection: Secondary | ICD-10-CM | POA: Diagnosis not present

## 2023-08-27 DIAGNOSIS — I509 Heart failure, unspecified: Secondary | ICD-10-CM | POA: Diagnosis not present

## 2023-08-27 DIAGNOSIS — H8110 Benign paroxysmal vertigo, unspecified ear: Secondary | ICD-10-CM | POA: Diagnosis not present

## 2023-08-27 DIAGNOSIS — Z556 Problems related to health literacy: Secondary | ICD-10-CM | POA: Diagnosis not present

## 2023-08-27 DIAGNOSIS — Z9181 History of falling: Secondary | ICD-10-CM | POA: Diagnosis not present

## 2023-08-27 DIAGNOSIS — N179 Acute kidney failure, unspecified: Secondary | ICD-10-CM | POA: Diagnosis not present

## 2023-08-27 DIAGNOSIS — Z96649 Presence of unspecified artificial hip joint: Secondary | ICD-10-CM | POA: Diagnosis not present

## 2023-08-27 DIAGNOSIS — I69322 Dysarthria following cerebral infarction: Secondary | ICD-10-CM | POA: Diagnosis not present

## 2023-08-27 DIAGNOSIS — K219 Gastro-esophageal reflux disease without esophagitis: Secondary | ICD-10-CM | POA: Diagnosis not present

## 2023-08-27 DIAGNOSIS — Z604 Social exclusion and rejection: Secondary | ICD-10-CM | POA: Diagnosis not present

## 2023-08-27 DIAGNOSIS — I739 Peripheral vascular disease, unspecified: Secondary | ICD-10-CM | POA: Diagnosis not present

## 2023-08-27 DIAGNOSIS — F5101 Primary insomnia: Secondary | ICD-10-CM | POA: Diagnosis not present

## 2023-08-27 DIAGNOSIS — F322 Major depressive disorder, single episode, severe without psychotic features: Secondary | ICD-10-CM | POA: Diagnosis not present

## 2023-08-27 DIAGNOSIS — I447 Left bundle-branch block, unspecified: Secondary | ICD-10-CM | POA: Diagnosis not present

## 2023-08-27 DIAGNOSIS — R32 Unspecified urinary incontinence: Secondary | ICD-10-CM | POA: Diagnosis not present

## 2023-08-27 DIAGNOSIS — Z7902 Long term (current) use of antithrombotics/antiplatelets: Secondary | ICD-10-CM | POA: Diagnosis not present

## 2023-08-27 DIAGNOSIS — I69351 Hemiplegia and hemiparesis following cerebral infarction affecting right dominant side: Secondary | ICD-10-CM | POA: Diagnosis not present

## 2023-08-27 DIAGNOSIS — I2510 Atherosclerotic heart disease of native coronary artery without angina pectoris: Secondary | ICD-10-CM | POA: Diagnosis not present

## 2023-08-27 DIAGNOSIS — K449 Diaphragmatic hernia without obstruction or gangrene: Secondary | ICD-10-CM | POA: Diagnosis not present

## 2023-08-27 DIAGNOSIS — H409 Unspecified glaucoma: Secondary | ICD-10-CM | POA: Diagnosis not present

## 2023-08-27 DIAGNOSIS — E782 Mixed hyperlipidemia: Secondary | ICD-10-CM | POA: Diagnosis not present

## 2023-08-27 DIAGNOSIS — I130 Hypertensive heart and chronic kidney disease with heart failure and stage 1 through stage 4 chronic kidney disease, or unspecified chronic kidney disease: Secondary | ICD-10-CM | POA: Diagnosis not present

## 2023-08-27 DIAGNOSIS — N1831 Chronic kidney disease, stage 3a: Secondary | ICD-10-CM | POA: Diagnosis not present

## 2023-09-02 DIAGNOSIS — I69351 Hemiplegia and hemiparesis following cerebral infarction affecting right dominant side: Secondary | ICD-10-CM | POA: Diagnosis not present

## 2023-09-02 DIAGNOSIS — K219 Gastro-esophageal reflux disease without esophagitis: Secondary | ICD-10-CM | POA: Diagnosis not present

## 2023-09-02 DIAGNOSIS — I739 Peripheral vascular disease, unspecified: Secondary | ICD-10-CM | POA: Diagnosis not present

## 2023-09-02 DIAGNOSIS — Z604 Social exclusion and rejection: Secondary | ICD-10-CM | POA: Diagnosis not present

## 2023-09-02 DIAGNOSIS — I69322 Dysarthria following cerebral infarction: Secondary | ICD-10-CM | POA: Diagnosis not present

## 2023-09-02 DIAGNOSIS — E782 Mixed hyperlipidemia: Secondary | ICD-10-CM | POA: Diagnosis not present

## 2023-09-02 DIAGNOSIS — F322 Major depressive disorder, single episode, severe without psychotic features: Secondary | ICD-10-CM | POA: Diagnosis not present

## 2023-09-02 DIAGNOSIS — N179 Acute kidney failure, unspecified: Secondary | ICD-10-CM | POA: Diagnosis not present

## 2023-09-02 DIAGNOSIS — Z7902 Long term (current) use of antithrombotics/antiplatelets: Secondary | ICD-10-CM | POA: Diagnosis not present

## 2023-09-02 DIAGNOSIS — H8110 Benign paroxysmal vertigo, unspecified ear: Secondary | ICD-10-CM | POA: Diagnosis not present

## 2023-09-02 DIAGNOSIS — K449 Diaphragmatic hernia without obstruction or gangrene: Secondary | ICD-10-CM | POA: Diagnosis not present

## 2023-09-02 DIAGNOSIS — Z96649 Presence of unspecified artificial hip joint: Secondary | ICD-10-CM | POA: Diagnosis not present

## 2023-09-02 DIAGNOSIS — I509 Heart failure, unspecified: Secondary | ICD-10-CM | POA: Diagnosis not present

## 2023-09-02 DIAGNOSIS — I2510 Atherosclerotic heart disease of native coronary artery without angina pectoris: Secondary | ICD-10-CM | POA: Diagnosis not present

## 2023-09-02 DIAGNOSIS — H409 Unspecified glaucoma: Secondary | ICD-10-CM | POA: Diagnosis not present

## 2023-09-02 DIAGNOSIS — I447 Left bundle-branch block, unspecified: Secondary | ICD-10-CM | POA: Diagnosis not present

## 2023-09-02 DIAGNOSIS — Z556 Problems related to health literacy: Secondary | ICD-10-CM | POA: Diagnosis not present

## 2023-09-02 DIAGNOSIS — R32 Unspecified urinary incontinence: Secondary | ICD-10-CM | POA: Diagnosis not present

## 2023-09-02 DIAGNOSIS — F5101 Primary insomnia: Secondary | ICD-10-CM | POA: Diagnosis not present

## 2023-09-02 DIAGNOSIS — I130 Hypertensive heart and chronic kidney disease with heart failure and stage 1 through stage 4 chronic kidney disease, or unspecified chronic kidney disease: Secondary | ICD-10-CM | POA: Diagnosis not present

## 2023-09-02 DIAGNOSIS — Z9181 History of falling: Secondary | ICD-10-CM | POA: Diagnosis not present

## 2023-09-02 DIAGNOSIS — N1831 Chronic kidney disease, stage 3a: Secondary | ICD-10-CM | POA: Diagnosis not present

## 2023-09-12 ENCOUNTER — Telehealth: Payer: Self-pay

## 2023-09-12 DIAGNOSIS — Z556 Problems related to health literacy: Secondary | ICD-10-CM | POA: Diagnosis not present

## 2023-09-12 DIAGNOSIS — Z604 Social exclusion and rejection: Secondary | ICD-10-CM | POA: Diagnosis not present

## 2023-09-12 DIAGNOSIS — K449 Diaphragmatic hernia without obstruction or gangrene: Secondary | ICD-10-CM | POA: Diagnosis not present

## 2023-09-12 DIAGNOSIS — I69322 Dysarthria following cerebral infarction: Secondary | ICD-10-CM | POA: Diagnosis not present

## 2023-09-12 DIAGNOSIS — I2510 Atherosclerotic heart disease of native coronary artery without angina pectoris: Secondary | ICD-10-CM | POA: Diagnosis not present

## 2023-09-12 DIAGNOSIS — E782 Mixed hyperlipidemia: Secondary | ICD-10-CM | POA: Diagnosis not present

## 2023-09-12 DIAGNOSIS — H409 Unspecified glaucoma: Secondary | ICD-10-CM | POA: Diagnosis not present

## 2023-09-12 DIAGNOSIS — H8110 Benign paroxysmal vertigo, unspecified ear: Secondary | ICD-10-CM | POA: Diagnosis not present

## 2023-09-12 DIAGNOSIS — N1831 Chronic kidney disease, stage 3a: Secondary | ICD-10-CM | POA: Diagnosis not present

## 2023-09-12 DIAGNOSIS — Z96649 Presence of unspecified artificial hip joint: Secondary | ICD-10-CM | POA: Diagnosis not present

## 2023-09-12 DIAGNOSIS — F322 Major depressive disorder, single episode, severe without psychotic features: Secondary | ICD-10-CM | POA: Diagnosis not present

## 2023-09-12 DIAGNOSIS — I130 Hypertensive heart and chronic kidney disease with heart failure and stage 1 through stage 4 chronic kidney disease, or unspecified chronic kidney disease: Secondary | ICD-10-CM | POA: Diagnosis not present

## 2023-09-12 DIAGNOSIS — I739 Peripheral vascular disease, unspecified: Secondary | ICD-10-CM | POA: Diagnosis not present

## 2023-09-12 DIAGNOSIS — Z7902 Long term (current) use of antithrombotics/antiplatelets: Secondary | ICD-10-CM | POA: Diagnosis not present

## 2023-09-12 DIAGNOSIS — F5101 Primary insomnia: Secondary | ICD-10-CM | POA: Diagnosis not present

## 2023-09-12 DIAGNOSIS — I69351 Hemiplegia and hemiparesis following cerebral infarction affecting right dominant side: Secondary | ICD-10-CM | POA: Diagnosis not present

## 2023-09-12 DIAGNOSIS — N179 Acute kidney failure, unspecified: Secondary | ICD-10-CM | POA: Diagnosis not present

## 2023-09-12 DIAGNOSIS — R32 Unspecified urinary incontinence: Secondary | ICD-10-CM | POA: Diagnosis not present

## 2023-09-12 DIAGNOSIS — K219 Gastro-esophageal reflux disease without esophagitis: Secondary | ICD-10-CM | POA: Diagnosis not present

## 2023-09-12 DIAGNOSIS — I509 Heart failure, unspecified: Secondary | ICD-10-CM | POA: Diagnosis not present

## 2023-09-12 DIAGNOSIS — Z9181 History of falling: Secondary | ICD-10-CM | POA: Diagnosis not present

## 2023-09-12 DIAGNOSIS — I447 Left bundle-branch block, unspecified: Secondary | ICD-10-CM | POA: Diagnosis not present

## 2023-09-12 NOTE — Telephone Encounter (Signed)
 [facility] - CERTIFICATION & POC  ORDER #[id number]

## 2023-09-12 NOTE — Telephone Encounter (Signed)
 [facility] [phone]

## 2023-09-16 ENCOUNTER — Telehealth: Payer: Self-pay

## 2023-09-16 NOTE — Telephone Encounter (Signed)
 Verbal ok given to [facility] for orders listed below.  Copied from CRM [id number]. Topic: Clinical - Home Health Verbal Orders >> Sep 16, 2023  8:25 AM Charlet HERO wrote: Caller/Agency: Mark/ [facility] Callback Number: [phone] Service Requested: Physical Therapy Frequency: 1x week for 6 weeks starting 09/16/23 Any new concerns about the patient? No

## 2023-09-20 DIAGNOSIS — K219 Gastro-esophageal reflux disease without esophagitis: Secondary | ICD-10-CM | POA: Diagnosis not present

## 2023-09-20 DIAGNOSIS — I2510 Atherosclerotic heart disease of native coronary artery without angina pectoris: Secondary | ICD-10-CM | POA: Diagnosis not present

## 2023-09-20 DIAGNOSIS — I69322 Dysarthria following cerebral infarction: Secondary | ICD-10-CM | POA: Diagnosis not present

## 2023-09-20 DIAGNOSIS — F322 Major depressive disorder, single episode, severe without psychotic features: Secondary | ICD-10-CM | POA: Diagnosis not present

## 2023-09-20 DIAGNOSIS — H8110 Benign paroxysmal vertigo, unspecified ear: Secondary | ICD-10-CM | POA: Diagnosis not present

## 2023-09-20 DIAGNOSIS — K449 Diaphragmatic hernia without obstruction or gangrene: Secondary | ICD-10-CM | POA: Diagnosis not present

## 2023-09-20 DIAGNOSIS — R32 Unspecified urinary incontinence: Secondary | ICD-10-CM | POA: Diagnosis not present

## 2023-09-20 DIAGNOSIS — Z7902 Long term (current) use of antithrombotics/antiplatelets: Secondary | ICD-10-CM | POA: Diagnosis not present

## 2023-09-20 DIAGNOSIS — N179 Acute kidney failure, unspecified: Secondary | ICD-10-CM | POA: Diagnosis not present

## 2023-09-20 DIAGNOSIS — F5101 Primary insomnia: Secondary | ICD-10-CM | POA: Diagnosis not present

## 2023-09-20 DIAGNOSIS — I69351 Hemiplegia and hemiparesis following cerebral infarction affecting right dominant side: Secondary | ICD-10-CM | POA: Diagnosis not present

## 2023-09-20 DIAGNOSIS — Z96649 Presence of unspecified artificial hip joint: Secondary | ICD-10-CM | POA: Diagnosis not present

## 2023-09-20 DIAGNOSIS — Z604 Social exclusion and rejection: Secondary | ICD-10-CM | POA: Diagnosis not present

## 2023-09-20 DIAGNOSIS — I509 Heart failure, unspecified: Secondary | ICD-10-CM | POA: Diagnosis not present

## 2023-09-20 DIAGNOSIS — Z556 Problems related to health literacy: Secondary | ICD-10-CM | POA: Diagnosis not present

## 2023-09-20 DIAGNOSIS — E782 Mixed hyperlipidemia: Secondary | ICD-10-CM | POA: Diagnosis not present

## 2023-09-20 DIAGNOSIS — H409 Unspecified glaucoma: Secondary | ICD-10-CM | POA: Diagnosis not present

## 2023-09-20 DIAGNOSIS — I447 Left bundle-branch block, unspecified: Secondary | ICD-10-CM | POA: Diagnosis not present

## 2023-09-20 DIAGNOSIS — Z9181 History of falling: Secondary | ICD-10-CM | POA: Diagnosis not present

## 2023-09-20 DIAGNOSIS — N1831 Chronic kidney disease, stage 3a: Secondary | ICD-10-CM | POA: Diagnosis not present

## 2023-09-20 DIAGNOSIS — I739 Peripheral vascular disease, unspecified: Secondary | ICD-10-CM | POA: Diagnosis not present

## 2023-09-20 DIAGNOSIS — I130 Hypertensive heart and chronic kidney disease with heart failure and stage 1 through stage 4 chronic kidney disease, or unspecified chronic kidney disease: Secondary | ICD-10-CM | POA: Diagnosis not present

## 2023-09-21 ENCOUNTER — Other Ambulatory Visit: Payer: Self-pay

## 2023-09-22 ENCOUNTER — Other Ambulatory Visit: Payer: Self-pay

## 2023-09-22 NOTE — Telephone Encounter (Signed)
 Copied from CRM [id number]. Topic: Clinical - Medication Refill >> Sep 22, 2023 11:16 AM Delon T wrote: Medication: pantoprazole  (PROTONIX ) 40 MG tablet- was last given by ER doctor  Has the patient contacted their pharmacy? No (Agent: If no, request that the patient contact the pharmacy for the refill. If patient does not wish to contact the pharmacy document the reason why and proceed with request.) (Agent: If yes, when and what did the pharmacy advise?)  This is the patient's preferred pharmacy:  [id number] - [place], [place] - [address] AT [facility] [address] [place] [address] Phone: [phone] Fax: [phone]   Is this the correct pharmacy for this prescription? Yes If no, delete pharmacy and type the correct one.   Has the prescription been filled recently? Yes  Is the patient out of the medication? Yes  Has the patient been seen for an appointment in the last year OR does the patient have an upcoming appointment? Yes  Can we respond through MyChart? No  Agent: Please be advised that Rx refills may take up to 3 business days. We ask that you follow-up with your pharmacy.

## 2023-09-24 DIAGNOSIS — N179 Acute kidney failure, unspecified: Secondary | ICD-10-CM | POA: Diagnosis not present

## 2023-09-24 DIAGNOSIS — Z9181 History of falling: Secondary | ICD-10-CM | POA: Diagnosis not present

## 2023-09-24 DIAGNOSIS — I2510 Atherosclerotic heart disease of native coronary artery without angina pectoris: Secondary | ICD-10-CM | POA: Diagnosis not present

## 2023-09-24 DIAGNOSIS — N1831 Chronic kidney disease, stage 3a: Secondary | ICD-10-CM | POA: Diagnosis not present

## 2023-09-24 DIAGNOSIS — R32 Unspecified urinary incontinence: Secondary | ICD-10-CM | POA: Diagnosis not present

## 2023-09-24 DIAGNOSIS — I130 Hypertensive heart and chronic kidney disease with heart failure and stage 1 through stage 4 chronic kidney disease, or unspecified chronic kidney disease: Secondary | ICD-10-CM | POA: Diagnosis not present

## 2023-09-24 DIAGNOSIS — H8110 Benign paroxysmal vertigo, unspecified ear: Secondary | ICD-10-CM | POA: Diagnosis not present

## 2023-09-24 DIAGNOSIS — Z7902 Long term (current) use of antithrombotics/antiplatelets: Secondary | ICD-10-CM | POA: Diagnosis not present

## 2023-09-24 DIAGNOSIS — I509 Heart failure, unspecified: Secondary | ICD-10-CM | POA: Diagnosis not present

## 2023-09-24 DIAGNOSIS — K449 Diaphragmatic hernia without obstruction or gangrene: Secondary | ICD-10-CM | POA: Diagnosis not present

## 2023-09-24 DIAGNOSIS — E782 Mixed hyperlipidemia: Secondary | ICD-10-CM | POA: Diagnosis not present

## 2023-09-24 DIAGNOSIS — I69351 Hemiplegia and hemiparesis following cerebral infarction affecting right dominant side: Secondary | ICD-10-CM | POA: Diagnosis not present

## 2023-09-24 DIAGNOSIS — I739 Peripheral vascular disease, unspecified: Secondary | ICD-10-CM | POA: Diagnosis not present

## 2023-09-24 DIAGNOSIS — I447 Left bundle-branch block, unspecified: Secondary | ICD-10-CM | POA: Diagnosis not present

## 2023-09-24 DIAGNOSIS — K219 Gastro-esophageal reflux disease without esophagitis: Secondary | ICD-10-CM | POA: Diagnosis not present

## 2023-09-24 DIAGNOSIS — Z96649 Presence of unspecified artificial hip joint: Secondary | ICD-10-CM | POA: Diagnosis not present

## 2023-09-24 DIAGNOSIS — F5101 Primary insomnia: Secondary | ICD-10-CM | POA: Diagnosis not present

## 2023-09-24 DIAGNOSIS — Z556 Problems related to health literacy: Secondary | ICD-10-CM | POA: Diagnosis not present

## 2023-09-24 DIAGNOSIS — Z604 Social exclusion and rejection: Secondary | ICD-10-CM | POA: Diagnosis not present

## 2023-09-24 DIAGNOSIS — I69322 Dysarthria following cerebral infarction: Secondary | ICD-10-CM | POA: Diagnosis not present

## 2023-09-24 DIAGNOSIS — F322 Major depressive disorder, single episode, severe without psychotic features: Secondary | ICD-10-CM | POA: Diagnosis not present

## 2023-09-24 DIAGNOSIS — H409 Unspecified glaucoma: Secondary | ICD-10-CM | POA: Diagnosis not present

## 2023-09-30 DIAGNOSIS — I2510 Atherosclerotic heart disease of native coronary artery without angina pectoris: Secondary | ICD-10-CM | POA: Diagnosis not present

## 2023-09-30 DIAGNOSIS — Z9181 History of falling: Secondary | ICD-10-CM | POA: Diagnosis not present

## 2023-09-30 DIAGNOSIS — I509 Heart failure, unspecified: Secondary | ICD-10-CM | POA: Diagnosis not present

## 2023-09-30 DIAGNOSIS — I447 Left bundle-branch block, unspecified: Secondary | ICD-10-CM | POA: Diagnosis not present

## 2023-09-30 DIAGNOSIS — Z96649 Presence of unspecified artificial hip joint: Secondary | ICD-10-CM | POA: Diagnosis not present

## 2023-09-30 DIAGNOSIS — K219 Gastro-esophageal reflux disease without esophagitis: Secondary | ICD-10-CM | POA: Diagnosis not present

## 2023-09-30 DIAGNOSIS — F5101 Primary insomnia: Secondary | ICD-10-CM | POA: Diagnosis not present

## 2023-09-30 DIAGNOSIS — E782 Mixed hyperlipidemia: Secondary | ICD-10-CM | POA: Diagnosis not present

## 2023-09-30 DIAGNOSIS — I130 Hypertensive heart and chronic kidney disease with heart failure and stage 1 through stage 4 chronic kidney disease, or unspecified chronic kidney disease: Secondary | ICD-10-CM | POA: Diagnosis not present

## 2023-09-30 DIAGNOSIS — Z7902 Long term (current) use of antithrombotics/antiplatelets: Secondary | ICD-10-CM | POA: Diagnosis not present

## 2023-09-30 DIAGNOSIS — I69322 Dysarthria following cerebral infarction: Secondary | ICD-10-CM | POA: Diagnosis not present

## 2023-09-30 DIAGNOSIS — H8110 Benign paroxysmal vertigo, unspecified ear: Secondary | ICD-10-CM | POA: Diagnosis not present

## 2023-09-30 DIAGNOSIS — I69351 Hemiplegia and hemiparesis following cerebral infarction affecting right dominant side: Secondary | ICD-10-CM | POA: Diagnosis not present

## 2023-09-30 DIAGNOSIS — H409 Unspecified glaucoma: Secondary | ICD-10-CM | POA: Diagnosis not present

## 2023-09-30 DIAGNOSIS — Z556 Problems related to health literacy: Secondary | ICD-10-CM | POA: Diagnosis not present

## 2023-09-30 DIAGNOSIS — N1831 Chronic kidney disease, stage 3a: Secondary | ICD-10-CM | POA: Diagnosis not present

## 2023-09-30 DIAGNOSIS — I739 Peripheral vascular disease, unspecified: Secondary | ICD-10-CM | POA: Diagnosis not present

## 2023-09-30 DIAGNOSIS — K449 Diaphragmatic hernia without obstruction or gangrene: Secondary | ICD-10-CM | POA: Diagnosis not present

## 2023-09-30 DIAGNOSIS — R32 Unspecified urinary incontinence: Secondary | ICD-10-CM | POA: Diagnosis not present

## 2023-09-30 DIAGNOSIS — N179 Acute kidney failure, unspecified: Secondary | ICD-10-CM | POA: Diagnosis not present

## 2023-09-30 DIAGNOSIS — Z604 Social exclusion and rejection: Secondary | ICD-10-CM | POA: Diagnosis not present

## 2023-09-30 DIAGNOSIS — F322 Major depressive disorder, single episode, severe without psychotic features: Secondary | ICD-10-CM | POA: Diagnosis not present

## 2023-10-06 ENCOUNTER — Telehealth: Payer: Self-pay

## 2023-10-06 NOTE — Telephone Encounter (Signed)
 [facility] [phone]

## 2023-10-09 DIAGNOSIS — K449 Diaphragmatic hernia without obstruction or gangrene: Secondary | ICD-10-CM | POA: Diagnosis not present

## 2023-10-09 DIAGNOSIS — Z7902 Long term (current) use of antithrombotics/antiplatelets: Secondary | ICD-10-CM | POA: Diagnosis not present

## 2023-10-09 DIAGNOSIS — I509 Heart failure, unspecified: Secondary | ICD-10-CM | POA: Diagnosis not present

## 2023-10-09 DIAGNOSIS — N179 Acute kidney failure, unspecified: Secondary | ICD-10-CM | POA: Diagnosis not present

## 2023-10-09 DIAGNOSIS — Z96649 Presence of unspecified artificial hip joint: Secondary | ICD-10-CM | POA: Diagnosis not present

## 2023-10-09 DIAGNOSIS — K219 Gastro-esophageal reflux disease without esophagitis: Secondary | ICD-10-CM | POA: Diagnosis not present

## 2023-10-09 DIAGNOSIS — Z556 Problems related to health literacy: Secondary | ICD-10-CM | POA: Diagnosis not present

## 2023-10-09 DIAGNOSIS — I447 Left bundle-branch block, unspecified: Secondary | ICD-10-CM | POA: Diagnosis not present

## 2023-10-09 DIAGNOSIS — I69351 Hemiplegia and hemiparesis following cerebral infarction affecting right dominant side: Secondary | ICD-10-CM | POA: Diagnosis not present

## 2023-10-09 DIAGNOSIS — E782 Mixed hyperlipidemia: Secondary | ICD-10-CM | POA: Diagnosis not present

## 2023-10-09 DIAGNOSIS — Z604 Social exclusion and rejection: Secondary | ICD-10-CM | POA: Diagnosis not present

## 2023-10-09 DIAGNOSIS — F5101 Primary insomnia: Secondary | ICD-10-CM | POA: Diagnosis not present

## 2023-10-09 DIAGNOSIS — N1831 Chronic kidney disease, stage 3a: Secondary | ICD-10-CM | POA: Diagnosis not present

## 2023-10-09 DIAGNOSIS — I130 Hypertensive heart and chronic kidney disease with heart failure and stage 1 through stage 4 chronic kidney disease, or unspecified chronic kidney disease: Secondary | ICD-10-CM | POA: Diagnosis not present

## 2023-10-09 DIAGNOSIS — H8110 Benign paroxysmal vertigo, unspecified ear: Secondary | ICD-10-CM | POA: Diagnosis not present

## 2023-10-09 DIAGNOSIS — Z9181 History of falling: Secondary | ICD-10-CM | POA: Diagnosis not present

## 2023-10-09 DIAGNOSIS — H409 Unspecified glaucoma: Secondary | ICD-10-CM | POA: Diagnosis not present

## 2023-10-09 DIAGNOSIS — F322 Major depressive disorder, single episode, severe without psychotic features: Secondary | ICD-10-CM | POA: Diagnosis not present

## 2023-10-09 DIAGNOSIS — I739 Peripheral vascular disease, unspecified: Secondary | ICD-10-CM | POA: Diagnosis not present

## 2023-10-09 DIAGNOSIS — R32 Unspecified urinary incontinence: Secondary | ICD-10-CM | POA: Diagnosis not present

## 2023-10-09 DIAGNOSIS — I2510 Atherosclerotic heart disease of native coronary artery without angina pectoris: Secondary | ICD-10-CM | POA: Diagnosis not present

## 2023-10-09 DIAGNOSIS — I69322 Dysarthria following cerebral infarction: Secondary | ICD-10-CM | POA: Diagnosis not present

## 2023-10-15 ENCOUNTER — Telehealth: Payer: Self-pay

## 2023-10-15 DIAGNOSIS — I447 Left bundle-branch block, unspecified: Secondary | ICD-10-CM | POA: Diagnosis not present

## 2023-10-15 DIAGNOSIS — F322 Major depressive disorder, single episode, severe without psychotic features: Secondary | ICD-10-CM | POA: Diagnosis not present

## 2023-10-15 DIAGNOSIS — H409 Unspecified glaucoma: Secondary | ICD-10-CM | POA: Diagnosis not present

## 2023-10-15 DIAGNOSIS — K449 Diaphragmatic hernia without obstruction or gangrene: Secondary | ICD-10-CM | POA: Diagnosis not present

## 2023-10-15 DIAGNOSIS — R32 Unspecified urinary incontinence: Secondary | ICD-10-CM | POA: Diagnosis not present

## 2023-10-15 DIAGNOSIS — I69351 Hemiplegia and hemiparesis following cerebral infarction affecting right dominant side: Secondary | ICD-10-CM | POA: Diagnosis not present

## 2023-10-15 DIAGNOSIS — I739 Peripheral vascular disease, unspecified: Secondary | ICD-10-CM | POA: Diagnosis not present

## 2023-10-15 DIAGNOSIS — Z604 Social exclusion and rejection: Secondary | ICD-10-CM | POA: Diagnosis not present

## 2023-10-15 DIAGNOSIS — I2510 Atherosclerotic heart disease of native coronary artery without angina pectoris: Secondary | ICD-10-CM | POA: Diagnosis not present

## 2023-10-15 DIAGNOSIS — I509 Heart failure, unspecified: Secondary | ICD-10-CM | POA: Diagnosis not present

## 2023-10-15 DIAGNOSIS — H8110 Benign paroxysmal vertigo, unspecified ear: Secondary | ICD-10-CM | POA: Diagnosis not present

## 2023-10-15 DIAGNOSIS — Z9181 History of falling: Secondary | ICD-10-CM | POA: Diagnosis not present

## 2023-10-15 DIAGNOSIS — I130 Hypertensive heart and chronic kidney disease with heart failure and stage 1 through stage 4 chronic kidney disease, or unspecified chronic kidney disease: Secondary | ICD-10-CM | POA: Diagnosis not present

## 2023-10-15 DIAGNOSIS — E782 Mixed hyperlipidemia: Secondary | ICD-10-CM | POA: Diagnosis not present

## 2023-10-15 DIAGNOSIS — Z96649 Presence of unspecified artificial hip joint: Secondary | ICD-10-CM | POA: Diagnosis not present

## 2023-10-15 DIAGNOSIS — Z7902 Long term (current) use of antithrombotics/antiplatelets: Secondary | ICD-10-CM | POA: Diagnosis not present

## 2023-10-15 DIAGNOSIS — K219 Gastro-esophageal reflux disease without esophagitis: Secondary | ICD-10-CM | POA: Diagnosis not present

## 2023-10-15 DIAGNOSIS — N1831 Chronic kidney disease, stage 3a: Secondary | ICD-10-CM | POA: Diagnosis not present

## 2023-10-15 DIAGNOSIS — N179 Acute kidney failure, unspecified: Secondary | ICD-10-CM | POA: Diagnosis not present

## 2023-10-15 DIAGNOSIS — Z556 Problems related to health literacy: Secondary | ICD-10-CM | POA: Diagnosis not present

## 2023-10-15 DIAGNOSIS — I69322 Dysarthria following cerebral infarction: Secondary | ICD-10-CM | POA: Diagnosis not present

## 2023-10-15 DIAGNOSIS — F5101 Primary insomnia: Secondary | ICD-10-CM | POA: Diagnosis not present

## 2023-10-15 NOTE — Telephone Encounter (Signed)
 [facility] CERTIFICATION AND POC - ORDER #[id number]

## 2023-10-21 IMAGING — CT CT HEAD W/O CM
3 series · 15 of 47 positions shown, 18 images · non-contrast
Comparison: CT head 04/19/2021

CLINICAL DATA: Neuro deficit.  Right leg pain and weakness



[Series 3: head 5.0 h30s · axial · 0.44mm/px · z∈[+230,+355]mm · 9 of 31 slices shown, 12 images]
[im 3/31  brain]
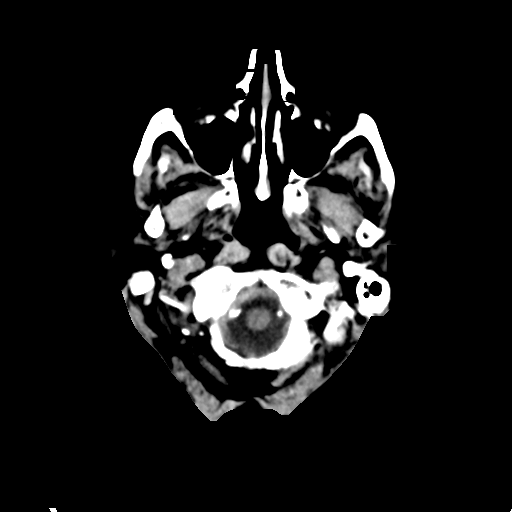
[im 3/31  bone]
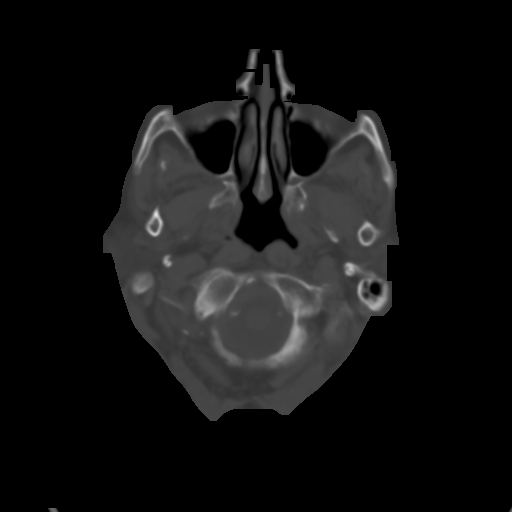
[im 6/31  brain]
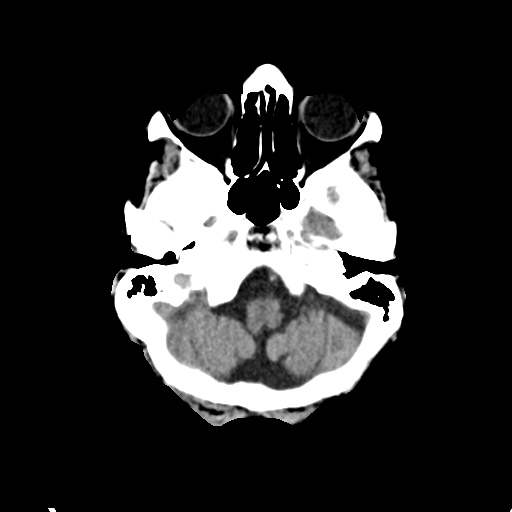
[im 9/31  brain]
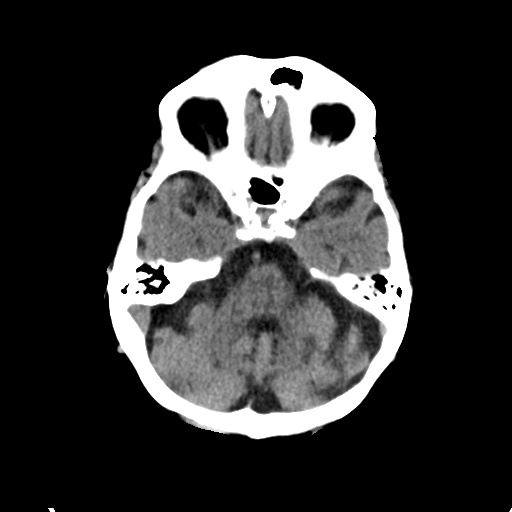
[im 12/31  brain]
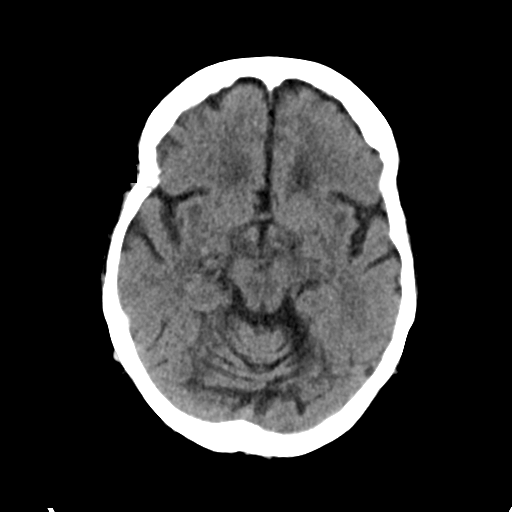
[im 16/31  brain]
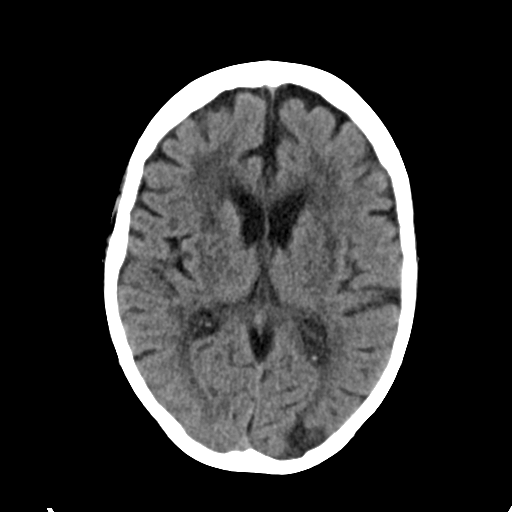
[im 16/31  bone]
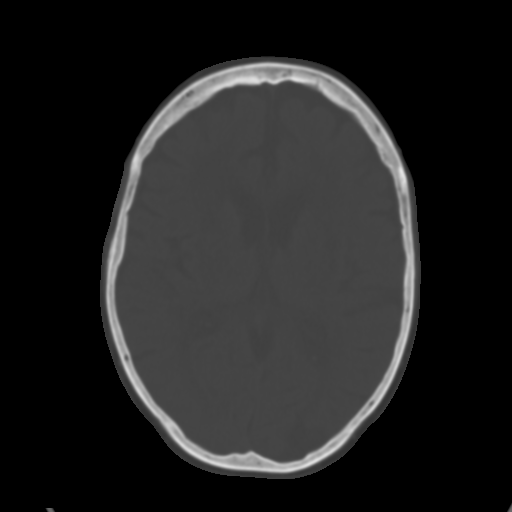
[im 19/31  brain]
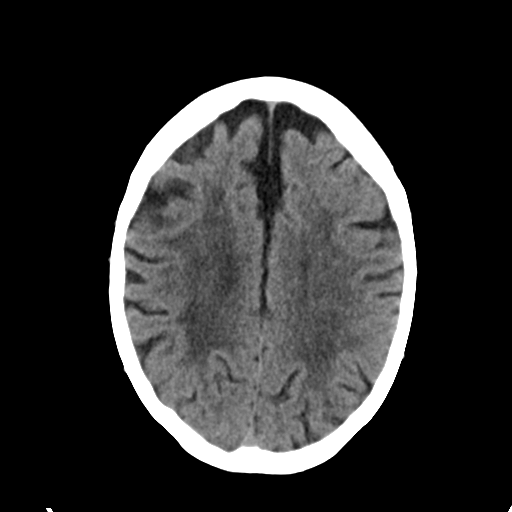
[im 22/31  brain]
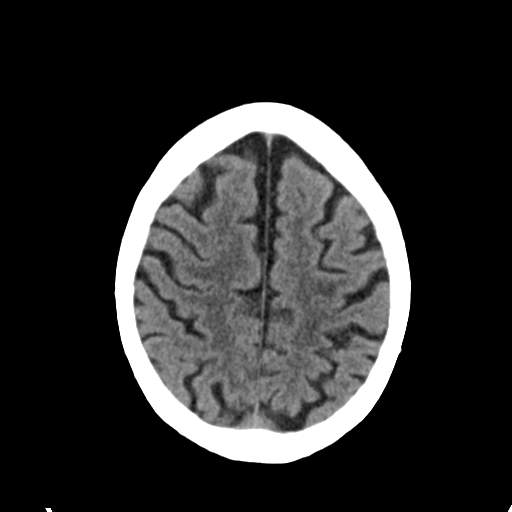
[im 25/31  brain]
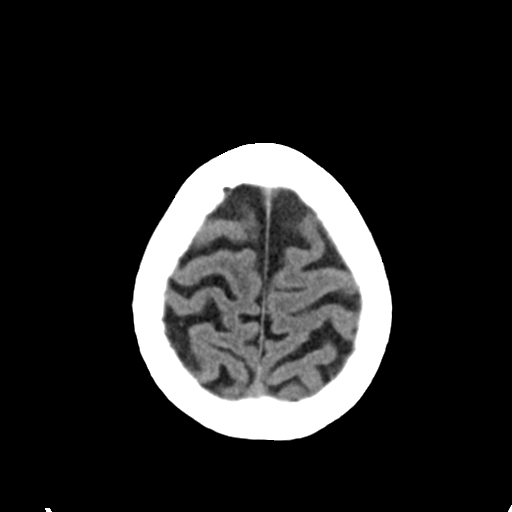
[im 28/31  brain]
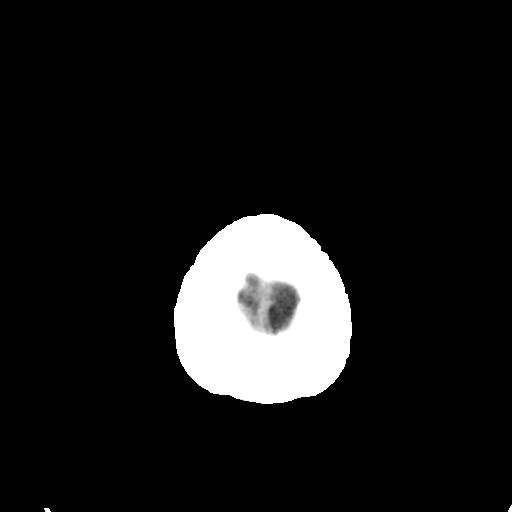
[im 28/31  bone]
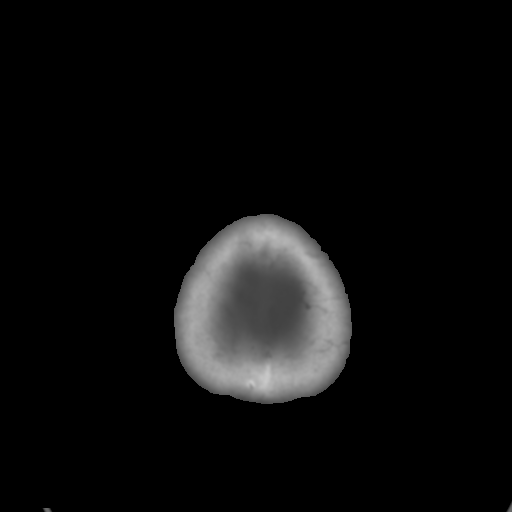

[Series 5: head 3.0 mpr cor · coronal · 0.32mm/px · 3 of 70 slices shown]
[im 24/70  brain]
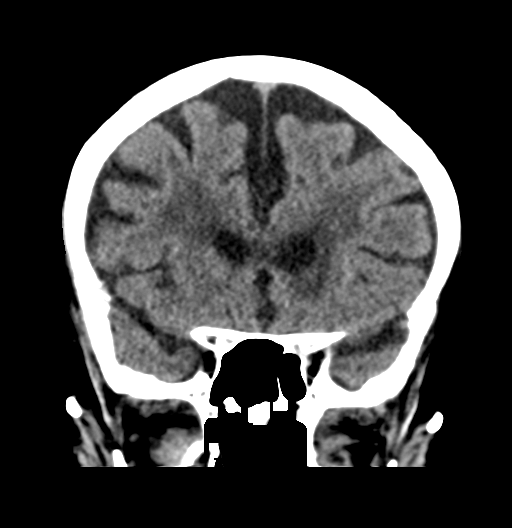
[im 31/70  brain]
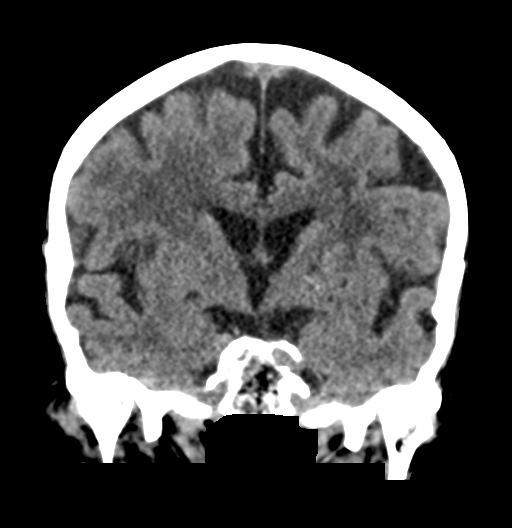
[im 39/70  brain]
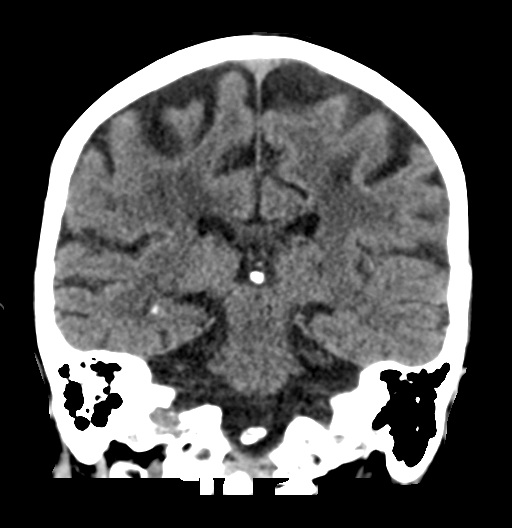

[Series 6: head 3.0 mpr sag · sagittal · 0.30mm/px · 3 of 55 slices shown]
[im 19/55  brain]
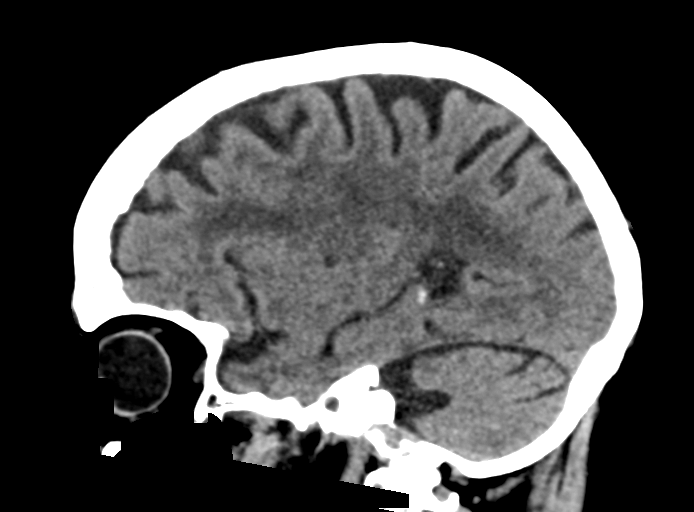
[im 28/55  brain]
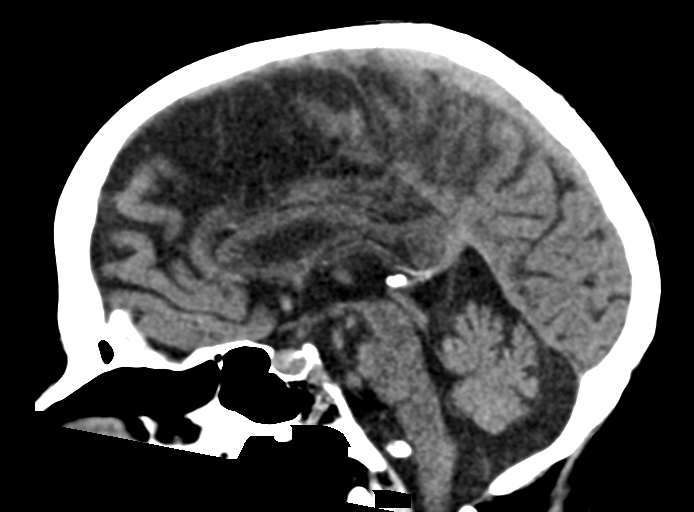
[im 37/55  brain]
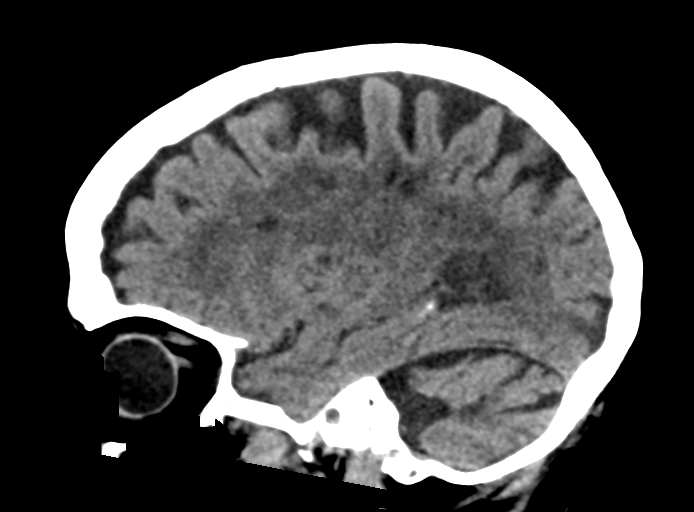

[15 of 47 positions shown; findings below may reference images not displayed]

FINDINGS: Brain: No acute intracranial hemorrhage, mass effect, or herniation.
No extra-axial fluid collections. No evidence of acute territorial
infarct. No hydrocephalus. Mild cortical volume loss. Extensive
patchy hypodensities throughout the periventricular and subcortical
white matter, likely secondary to chronic microvascular ischemic
changes. Small old lacunar infarcts in the bilateral basal ganglia.
Old infarct in the left occipital lobe. Old infarct in the left
cerebellum.

Vascular: Calcified plaques in the carotid siphons.

Skull: Normal. Negative for fracture or focal lesion.

Sinuses/Orbits: No acute finding.

Other: None.
IMPRESSION: No acute intracranial process identified. Chronic changes including
multiple old infarcts.

## 2023-10-23 DIAGNOSIS — F322 Major depressive disorder, single episode, severe without psychotic features: Secondary | ICD-10-CM | POA: Diagnosis not present

## 2023-10-23 DIAGNOSIS — I509 Heart failure, unspecified: Secondary | ICD-10-CM | POA: Diagnosis not present

## 2023-10-23 DIAGNOSIS — Z96649 Presence of unspecified artificial hip joint: Secondary | ICD-10-CM | POA: Diagnosis not present

## 2023-10-23 DIAGNOSIS — K219 Gastro-esophageal reflux disease without esophagitis: Secondary | ICD-10-CM | POA: Diagnosis not present

## 2023-10-23 DIAGNOSIS — Z9181 History of falling: Secondary | ICD-10-CM | POA: Diagnosis not present

## 2023-10-23 DIAGNOSIS — Z556 Problems related to health literacy: Secondary | ICD-10-CM | POA: Diagnosis not present

## 2023-10-23 DIAGNOSIS — N179 Acute kidney failure, unspecified: Secondary | ICD-10-CM | POA: Diagnosis not present

## 2023-10-23 DIAGNOSIS — H8110 Benign paroxysmal vertigo, unspecified ear: Secondary | ICD-10-CM | POA: Diagnosis not present

## 2023-10-23 DIAGNOSIS — I739 Peripheral vascular disease, unspecified: Secondary | ICD-10-CM | POA: Diagnosis not present

## 2023-10-23 DIAGNOSIS — I130 Hypertensive heart and chronic kidney disease with heart failure and stage 1 through stage 4 chronic kidney disease, or unspecified chronic kidney disease: Secondary | ICD-10-CM | POA: Diagnosis not present

## 2023-10-23 DIAGNOSIS — K449 Diaphragmatic hernia without obstruction or gangrene: Secondary | ICD-10-CM | POA: Diagnosis not present

## 2023-10-23 DIAGNOSIS — E782 Mixed hyperlipidemia: Secondary | ICD-10-CM | POA: Diagnosis not present

## 2023-10-23 DIAGNOSIS — Z7902 Long term (current) use of antithrombotics/antiplatelets: Secondary | ICD-10-CM | POA: Diagnosis not present

## 2023-10-23 DIAGNOSIS — F5101 Primary insomnia: Secondary | ICD-10-CM | POA: Diagnosis not present

## 2023-10-23 DIAGNOSIS — I69322 Dysarthria following cerebral infarction: Secondary | ICD-10-CM | POA: Diagnosis not present

## 2023-10-23 DIAGNOSIS — I447 Left bundle-branch block, unspecified: Secondary | ICD-10-CM | POA: Diagnosis not present

## 2023-10-23 DIAGNOSIS — I2510 Atherosclerotic heart disease of native coronary artery without angina pectoris: Secondary | ICD-10-CM | POA: Diagnosis not present

## 2023-10-23 DIAGNOSIS — R32 Unspecified urinary incontinence: Secondary | ICD-10-CM | POA: Diagnosis not present

## 2023-10-23 DIAGNOSIS — Z604 Social exclusion and rejection: Secondary | ICD-10-CM | POA: Diagnosis not present

## 2023-10-23 DIAGNOSIS — I69351 Hemiplegia and hemiparesis following cerebral infarction affecting right dominant side: Secondary | ICD-10-CM | POA: Diagnosis not present

## 2023-10-23 DIAGNOSIS — N1831 Chronic kidney disease, stage 3a: Secondary | ICD-10-CM | POA: Diagnosis not present

## 2023-10-23 DIAGNOSIS — H409 Unspecified glaucoma: Secondary | ICD-10-CM | POA: Diagnosis not present

## 2023-10-26 ENCOUNTER — Other Ambulatory Visit: Payer: Self-pay

## 2023-11-12 ENCOUNTER — Ambulatory Visit

## 2023-11-18 ENCOUNTER — Ambulatory Visit

## 2023-11-18 VITALS — BP 150/68 | HR 66 | Temp 98.2°F | Ht 60.0 in | Wt 143.0 lb

## 2023-11-18 DIAGNOSIS — I10 Essential (primary) hypertension: Secondary | ICD-10-CM | POA: Diagnosis not present

## 2023-11-18 DIAGNOSIS — D638 Anemia in other chronic diseases classified elsewhere: Secondary | ICD-10-CM | POA: Diagnosis not present

## 2023-11-18 DIAGNOSIS — N1831 Chronic kidney disease, stage 3a: Secondary | ICD-10-CM | POA: Diagnosis not present

## 2023-11-18 DIAGNOSIS — E782 Mixed hyperlipidemia: Secondary | ICD-10-CM

## 2023-11-18 DIAGNOSIS — F322 Major depressive disorder, single episode, severe without psychotic features: Secondary | ICD-10-CM

## 2023-11-18 DIAGNOSIS — K219 Gastro-esophageal reflux disease without esophagitis: Secondary | ICD-10-CM

## 2023-11-18 MED ORDER — PANTOPRAZOLE SODIUM 40 MG PO TBEC: 40.0000 mg | DELAYED_RELEASE_TABLET | Freq: Every day | ORAL | 1 refills | Status: AC

## 2023-11-18 MED ORDER — ESCITALOPRAM OXALATE 10 MG PO TABS: 10.0000 mg | ORAL_TABLET | Freq: Every day | ORAL | 1 refills | Status: DC

## 2023-11-18 MED ORDER — CLOPIDOGREL BISULFATE 75 MG PO TABS: 75.0000 mg | ORAL_TABLET | Freq: Every day | ORAL | 1 refills | Status: AC

## 2023-11-18 MED ORDER — FAMOTIDINE 20 MG PO TABS: 20.0000 mg | ORAL_TABLET | Freq: Every day | ORAL | 1 refills | Status: AC

## 2023-11-18 NOTE — Progress Notes (Unsigned)
 Subjective:  Patient ID: [name], female    DOB: [birth year]  Age: 83 y.o. MRN: [id number]  Chief Complaint  Patient presents with  . Medical Management of Chronic Issues    HPI: Discussed the use of AI scribe software for clinical note transcription with the patient, who gave verbal consent to proceed.  Discussed the use of AI scribe software for clinical note transcription with the patient, who gave verbal consent to proceed.  History of Present Illness   [name] is an 83 year old female with hypertension who presents for a follow-up appointment.  Hypertension - Currently taking valsartan  80 mg twice daily. - Amlodipine  was discontinued two weeks ago after being reduced from 10 mg to 5 mg due to low blood pressures. - Recent blood pressure readings elevated at 160-170/70 mmHg. - History of 'lightener strokes'. - No chest pain, shortness of breath, or palpitations. - Tylenol  325 mg used for pain management. - Medications taken as prescribed with assistance from her daughter.  Mood disturbance - Currently taking Lexapro . - No significant improvement in mood with Lexapro .  Gastrointestinal symptoms - No abdominal pain. - No bowel issues.  Functional status and living situation - Currently resides with daughter and granddaughter, who assist with her care and medication administration. - Attempting to move back to her old home, which has been unoccupied for 25 years. - Move complicated by financial constraints and need for home preparation. - Old home is approximately 1.5 miles from current residence. - [employer] Whe[employer] for lunch; breakfast and supper managed with her daughter. - Widowed three years ago. - Raising her 66 year old granddaughter, who has been unwell since February.            11/18/2023   11:40 AM 07/29/2023   11:03 AM 05/27/2023   11:15 AM 05/27/2023   11:14 AM 11/15/2022   10:56 AM  Depression screen PHQ 2/9  Decreased Interest 0 0 0 0  0  Down, Depressed, Hopeless 0 0 0 0 0  PHQ - 2 Score 0 0 0 0 0  Altered sleeping 2    2  Tired, decreased energy 3    3  Change in appetite 0    0  Feeling bad or failure about yourself  0    0  Trouble concentrating 1    1  Moving slowly or fidgety/restless 0    0  Suicidal thoughts 0    0  PHQ-9 Score 6    6  Difficult doing work/chores Not difficult at all    Not difficult at all        11/18/2023   11:40 AM  Fall Risk   Falls in the past year? 0  Number falls in past yr: 0  Injury with Fall? 0  Risk for fall due to : No Fall Risks  Follow up Falls evaluation completed    Patient Care Team: Summer Mccolgan, MD as PCP - General (Family Medicine) Delford Maude BROCKS, MD as PCP - Cardiology (Cardiology) Delford Maude BROCKS, MD as Consulting Physician (Cardiology) Nyle Rankin POUR, [facility] (Inactive) (Pharmacist)   Review of Systems  Constitutional:  Negative for chills, fatigue and fever.  HENT:  Negative for congestion, ear pain, sinus pressure and sore throat.   Respiratory:  Negative for cough and shortness of breath.   Cardiovascular:  Negative for chest pain.  Gastrointestinal:  Negative for abdominal pain, constipation, diarrhea, nausea and vomiting.  Genitourinary:  Negative for dysuria and  frequency.  Musculoskeletal:  Negative for arthralgias, back pain and myalgias.  Neurological:  Negative for dizziness and headaches.  Psychiatric/Behavioral:  Positive for dysphoric mood. The patient is not nervous/anxious.     Current Outpatient Medications on File Prior to Visit  Medication Sig Dispense Refill  . acetaminophen  (TYLENOL ) 325 MG tablet Take 650 mg by mouth every 4 (four) hours as needed.    . amLODipine  (NORVASC ) 5 MG tablet Take 1 tablet (5 mg total) by mouth daily. 90 tablet 2  . Cholecalciferol  1.25 MG (50000 UT) capsule Take 50,000 Units by mouth once a week. Take one capsule at bedtime on Tuesday evening.    . Evolocumab  (REPATHA  SURECLICK) 140 MG/ML SOAJ Inject  140 mg into the skin every 14 (fourteen) days. 2 mL 11  . furosemide  (LASIX ) 20 MG tablet Take 1 tablet (20 mg total) by mouth daily as needed for edema. 90 tablet 1  . latanoprost  (XALATAN ) 0.005 % ophthalmic solution Place 1 drop into both eyes at bedtime.    . loratadine  (CLARITIN ) 10 MG tablet Take 10 mg by mouth at bedtime.    . Multiple Vitamin (MULTIVITAMIN ADULT PO) Take 1 tablet by mouth daily. Ired multivitamin    . OVER THE COUNTER MEDICATION Apply 1 application. topically daily as needed (knee pain). Hempvana pain relief cream    . valsartan  (DIOVAN ) 80 MG tablet Take 2 tablets (160 mg total) by mouth daily. If systolic BP greater than 140 mmHG give 160 mg by mouth daily, if systolic BP between 120-140 mmHG give 80 mg by mouth daily (Patient taking differently: Take 80 mg by mouth daily. If systolic BP greater than 140 mmHG give 80 mg by mouth daily.) 180 tablet 0  . vitamin B-12 (CYANOCOBALAMIN ) 1000 MCG tablet Take 1,000 mcg by mouth every evening.     No current facility-administered medications on file prior to visit.   Past Medical History:  Diagnosis Date  . Acute embolic stroke (HCC)   . Benign paroxysmal vertigo, unspecified ear 06/07/2019  . CKD (chronic kidney disease), stage III (HCC)   . GERD (gastroesophageal reflux disease)   . Glaucoma   . Incisional infection 12/25/2020   Past Surgical History:  Procedure Laterality Date  . ABDOMINAL HYSTERECTOMY    . BACK SURGERY    . cataract surgery    . COLONOSCOPY N/A 08/03/2015   Procedure: COLONOSCOPY;  Surgeon: Lynwood Bohr, MD;  Location: [facility] ENDOSCOPY;  Service: Endoscopy;  Laterality: N/A;  . ESOPHAGOGASTRODUODENOSCOPY N/A 07/31/2015   Procedure: ESOPHAGOGASTRODUODENOSCOPY (EGD);  Surgeon: Lynwood Bohr, MD;  Location: [facility] ENDOSCOPY;  Service: Endoscopy;  Laterality: N/A;  . IR ANGIO INTRA EXTRACRAN SEL COM CAROTID INNOMINATE BILAT MOD SED  06/01/2021  . IR ANGIO VERTEBRAL SEL SUBCLAVIAN INNOMINATE UNI L MOD SED   06/01/2021  . IR RADIOLOGIST EVAL & MGMT  04/26/2021  . OOPHORECTOMY    . TOTAL HIP ARTHROPLASTY    . URETER SURGERY      Family History  Problem Relation Age of Onset  . Hypertension Mother   . Hypertension Father   . Heart attack Father   . Hypertension Brother   . Diabetes Brother    Social History   Socioeconomic History  . Marital status: Married    Spouse name: Not on file  . Number of children: Not on file  . Years of education: Not on file  . Highest education level: Not on file  Occupational History  . Not on file  Tobacco Use  .  Smoking status: Never  . Smokeless tobacco: Never  Vaping Use  . Vaping status: Never Used  Substance and Sexual Activity  . Alcohol use: No  . Drug use: No  . Sexual activity: Not Currently  Other Topics Concern  . Not on file  Social History Narrative  . Not on file   Social Drivers of Health   Financial Resource Strain: Low Risk  (11/06/2021)   Overall Financial Resource Strain (CARDIA)   . Difficulty of Paying Living Expenses: Not hard at all  Food Insecurity: No Food Insecurity (07/15/2023)   Hunger Vital Sign   . Worried About Programme researcher, broadcasting/film/video in the Last Year: Never true   . Ran Out of Food in the Last Year: Never true  Transportation Needs: No Transportation Needs (07/15/2023)   PRAPARE - Transportation   . Lack of Transportation (Medical): No   . Lack of Transportation (Non-Medical): No  Physical Activity: Inactive (05/27/2023)   Exercise Vital Sign   . Days of Exercise per Week: 0 days   . Minutes of Exercise per Session: 0 min  Stress: No Stress Concern Present (05/27/2023)   [employer] of Occupational Health - Occupational Stress Questionnaire   . Feeling of Stress : Not at all  Social Connections: Moderately Isolated (07/15/2023)   Social Connection and Isolation Panel   . Frequency of Communication with Friends and Family: More than three times a week   . Frequency of Social Gatherings with Friends and  Family: More than three times a week   . Attends Religious Services: 1 to 4 times per year   . Active Member of Clubs or Organizations: No   . Attends Banker Meetings: Never   . Marital Status: Widowed    Objective:  BP (!) 162/80   Pulse 66   Temp 98.2 F (36.8 C)   Ht 5' (1.524 m)   Wt 143 lb (64.9 kg)   SpO2 96%   BMI 27.93 kg/m      11/18/2023   11:34 AM 07/29/2023   10:58 AM 07/16/2023    3:28 PM  BP/Weight  Systolic BP 162 152 161  Diastolic BP 80 88 57  Wt. (Lbs) 143 152.34   BMI 27.93 kg/m2 29.75 kg/m2     Physical Exam Vitals and nursing note reviewed.  Constitutional:      Comments: Sitting in wheel chair  Cardiovascular:     Rate and Rhythm: Normal rate and regular rhythm.  Pulmonary:     Effort: Pulmonary effort is normal.     Breath sounds: Normal breath sounds.  Neurological:     General: No focal deficit present.     Mental Status: She is alert.  Psychiatric:        Mood and Affect: Mood normal.     {Perform Simple Foot Exam  Perform Detailed exam:1} {Insert foot Exam (Optional):30965}   Lab Results  Component Value Date   WBC 6.8 07/15/2023   HGB 11.6 (L) 07/15/2023   HCT 35.8 (L) 07/15/2023   PLT 249 07/15/2023   GLUCOSE 111 (H) 07/15/2023   CHOL 230 (H) 07/15/2023   TRIG 178 (H) 07/15/2023   HDL 34 (L) 07/15/2023   LDLCALC 160 (H) 07/15/2023   ALT 13 07/15/2023   AST 14 (L) 07/15/2023   NA 142 07/15/2023   K 3.9 07/15/2023   CL 107 07/15/2023   CREATININE 1.19 (H) 07/15/2023   BUN 18 07/15/2023   CO2 25 07/15/2023   TSH  1.920 11/15/2022   INR 1.1 08/24/2022   HGBA1C 5.2 07/15/2023      Assessment & Plan:  Stage 3a chronic kidney disease (HCC) -     CBC with Differential/Platelet -     Comprehensive metabolic panel with GFR  Gastroesophageal reflux disease, unspecified whether esophagitis present -     Famotidine ; Take 1 tablet (20 mg total) by mouth daily.  Dispense: 90 tablet; Refill: 1  Essential  (primary) hypertension -     Comprehensive metabolic panel with GFR  Anemia, chronic disease -     CBC with Differential/Platelet -     Vitamin B12  Mixed hyperlipidemia -     Lipid panel  Other orders -     Pantoprazole  Sodium; Take 1 tablet (40 mg total) by mouth daily.  Dispense: 90 tablet; Refill: 1 -     Clopidogrel  Bisulfate; Take 1 tablet (75 mg total) by mouth at bedtime.  Dispense: 90 tablet; Refill: 1 -     Escitalopram  Oxalate; Take 1 tablet (10 mg total) by mouth daily.  Dispense: 90 tablet; Refill: 1     Assessment and Plan      Meds ordered this encounter  Medications  . pantoprazole  (PROTONIX ) 40 MG tablet    Sig: Take 1 tablet (40 mg total) by mouth daily.    Dispense:  90 tablet    Refill:  1  . clopidogrel  (PLAVIX ) 75 MG tablet    Sig: Take 1 tablet (75 mg total) by mouth at bedtime.    Dispense:  90 tablet    Refill:  1  . famotidine  (PEPCID ) 20 MG tablet    Sig: Take 1 tablet (20 mg total) by mouth daily.    Dispense:  90 tablet    Refill:  1  . escitalopram  (LEXAPRO ) 10 MG tablet    Sig: Take 1 tablet (10 mg total) by mouth daily.    Dispense:  90 tablet    Refill:  1    Orders Placed This Encounter  Procedures  . CBC with Differential  . Comprehensive metabolic panel with GFR  . Lipid Panel  . Vitamin B12     Follow-up: No follow-ups on file.   I,Candice Gribble,acting as a Neurosurgeon for Dayron Odland, MD.,have documented all relevant documentation on the behalf of Greydon Betke, MD,as directed by  Declin Rajan, MD while in the presence of Tommy Schimke, MD.   An After Visit Summary was printed and given to the patient.  Quanah Majka, MD [facility] [phone]

## 2023-11-19 NOTE — Assessment & Plan Note (Signed)
 Depression managed with Lexapro  . She reports no significant change in symptoms but acknowledges medication helps when taken consistently. - Continue current dose of Lexapro  10 mg daily. - daughter does not feel the need for dose increase at this time.

## 2023-11-19 NOTE — Assessment & Plan Note (Signed)
 Hypertension with recent elevated readings of 160-170/70. Amlodipine  was stopped two weeks ago, potentially contributing to increased blood pressure. Valsartan  80 mg is administered twice daily. Blood pressure management is crucial due to light strokes. Amlodipine  does not affect heart rate, and its reintroduction may stabilize blood pressure. - Reinstate amlodipine  5 mg daily. - Continue valsartan  80 mg twice daily. - Monitor blood pressure regularly, especially before administering medication.

## 2023-12-03 ENCOUNTER — Telehealth: Payer: Self-pay

## 2023-12-03 NOTE — Telephone Encounter (Signed)
 Copied from CRM [id number]. Topic: Clinical - Medication Question >> Dec 03, 2023  1:36 PM DeAngela L wrote: Reason for CRM: patient daughter is calling cause the patient is more depressed and she is asking what she can give her or up the escitalopram  (LEXAPRO ) 10 MG tablet  And if the prescription is upped the patient will need a new script cause she will be out medication soon   Darice daughter [phone]

## 2023-12-04 ENCOUNTER — Other Ambulatory Visit: Payer: Self-pay

## 2023-12-04 MED ORDER — ESCITALOPRAM OXALATE 20 MG PO TABS: 20.0000 mg | ORAL_TABLET | Freq: Every day | ORAL | 0 refills | Status: AC

## 2023-12-08 NOTE — Telephone Encounter (Signed)
 Left detailed message with info and suggestion below.

## 2024-01-27 ENCOUNTER — Other Ambulatory Visit: Payer: Self-pay

## 2024-02-26 ENCOUNTER — Ambulatory Visit
